# Patient Record
Sex: Female | Born: 1965 | Race: White | Hispanic: No | Smoking: Former smoker
Health system: Southern US, Community
[De-identification: ages and names within clinical notes are randomized; demographics above are authoritative.]

## PROBLEM LIST (undated history)

## (undated) DIAGNOSIS — K219 Gastro-esophageal reflux disease without esophagitis: Secondary | ICD-10-CM

## (undated) DIAGNOSIS — F419 Anxiety disorder, unspecified: Secondary | ICD-10-CM

## (undated) DIAGNOSIS — F329 Major depressive disorder, single episode, unspecified: Secondary | ICD-10-CM

## (undated) DIAGNOSIS — E119 Type 2 diabetes mellitus without complications: Secondary | ICD-10-CM

## (undated) DIAGNOSIS — I10 Essential (primary) hypertension: Secondary | ICD-10-CM

## (undated) DIAGNOSIS — E7800 Pure hypercholesterolemia, unspecified: Secondary | ICD-10-CM

## (undated) DIAGNOSIS — K7460 Unspecified cirrhosis of liver: Secondary | ICD-10-CM

## (undated) DIAGNOSIS — E039 Hypothyroidism, unspecified: Secondary | ICD-10-CM

## (undated) DIAGNOSIS — F32A Depression, unspecified: Secondary | ICD-10-CM

## (undated) DIAGNOSIS — S82851S Displaced trimalleolar fracture of right lower leg, sequela: Secondary | ICD-10-CM

## (undated) HISTORY — PX: UMBILICAL HERNIA REPAIR: SHX196

## (undated) HISTORY — PX: WRIST SURGERY: SHX841

## (undated) HISTORY — PX: ABDOMINAL HYSTERECTOMY: SHX81

## (undated) HISTORY — PX: CHOLECYSTECTOMY: SHX55

## (undated) HISTORY — DX: Unspecified cirrhosis of liver: K74.60

---

## 2002-06-25 ENCOUNTER — Encounter: Payer: Self-pay | Admitting: Emergency Medicine

## 2002-06-25 ENCOUNTER — Emergency Department (HOSPITAL_COMMUNITY): Admission: EM | Admit: 2002-06-25 | Discharge: 2002-06-25 | Payer: Self-pay | Admitting: Emergency Medicine

## 2002-07-12 ENCOUNTER — Ambulatory Visit (HOSPITAL_COMMUNITY): Admission: RE | Admit: 2002-07-12 | Discharge: 2002-07-12 | Payer: Self-pay | Admitting: Pulmonary Disease

## 2003-08-04 ENCOUNTER — Ambulatory Visit (HOSPITAL_COMMUNITY): Admission: RE | Admit: 2003-08-04 | Discharge: 2003-08-04 | Payer: Self-pay | Admitting: Pulmonary Disease

## 2003-08-06 ENCOUNTER — Ambulatory Visit (HOSPITAL_COMMUNITY): Admission: RE | Admit: 2003-08-06 | Discharge: 2003-08-06 | Payer: Self-pay | Admitting: Pulmonary Disease

## 2003-08-08 ENCOUNTER — Ambulatory Visit (HOSPITAL_COMMUNITY): Admission: RE | Admit: 2003-08-08 | Discharge: 2003-08-08 | Payer: Self-pay | Admitting: Pulmonary Disease

## 2003-08-13 ENCOUNTER — Ambulatory Visit (HOSPITAL_COMMUNITY): Admission: RE | Admit: 2003-08-13 | Discharge: 2003-08-13 | Payer: Self-pay | Admitting: General Surgery

## 2003-10-22 ENCOUNTER — Ambulatory Visit (HOSPITAL_COMMUNITY): Admission: RE | Admit: 2003-10-22 | Discharge: 2003-10-22 | Payer: Self-pay | Admitting: Orthopedic Surgery

## 2004-07-20 ENCOUNTER — Encounter (HOSPITAL_COMMUNITY): Admission: RE | Admit: 2004-07-20 | Discharge: 2004-08-19 | Payer: Self-pay

## 2004-08-03 ENCOUNTER — Ambulatory Visit (HOSPITAL_COMMUNITY): Admission: RE | Admit: 2004-08-03 | Discharge: 2004-08-03 | Payer: Self-pay | Admitting: Pulmonary Disease

## 2004-09-08 ENCOUNTER — Ambulatory Visit (HOSPITAL_COMMUNITY): Admission: RE | Admit: 2004-09-08 | Discharge: 2004-09-08 | Payer: Self-pay | Admitting: Family Medicine

## 2005-07-21 ENCOUNTER — Emergency Department (HOSPITAL_COMMUNITY): Admission: EM | Admit: 2005-07-21 | Discharge: 2005-07-21 | Payer: Self-pay | Admitting: Emergency Medicine

## 2006-02-15 ENCOUNTER — Emergency Department (HOSPITAL_COMMUNITY): Admission: EM | Admit: 2006-02-15 | Discharge: 2006-02-15 | Payer: Self-pay | Admitting: Emergency Medicine

## 2006-10-09 ENCOUNTER — Emergency Department (HOSPITAL_COMMUNITY): Admission: EM | Admit: 2006-10-09 | Discharge: 2006-10-10 | Payer: Self-pay | Admitting: Emergency Medicine

## 2007-10-19 ENCOUNTER — Emergency Department (HOSPITAL_COMMUNITY): Admission: EM | Admit: 2007-10-19 | Discharge: 2007-10-19 | Payer: Self-pay | Admitting: Emergency Medicine

## 2009-10-08 ENCOUNTER — Ambulatory Visit: Payer: Self-pay | Admitting: Cardiology

## 2009-10-09 ENCOUNTER — Encounter: Payer: Self-pay | Admitting: Cardiology

## 2009-11-03 DIAGNOSIS — R079 Chest pain, unspecified: Secondary | ICD-10-CM

## 2009-11-03 DIAGNOSIS — K449 Diaphragmatic hernia without obstruction or gangrene: Secondary | ICD-10-CM

## 2009-11-03 DIAGNOSIS — E785 Hyperlipidemia, unspecified: Secondary | ICD-10-CM

## 2009-11-03 DIAGNOSIS — K219 Gastro-esophageal reflux disease without esophagitis: Secondary | ICD-10-CM | POA: Insufficient documentation

## 2009-11-03 DIAGNOSIS — E039 Hypothyroidism, unspecified: Secondary | ICD-10-CM | POA: Insufficient documentation

## 2009-11-03 DIAGNOSIS — F341 Dysthymic disorder: Secondary | ICD-10-CM | POA: Insufficient documentation

## 2009-11-03 DIAGNOSIS — E669 Obesity, unspecified: Secondary | ICD-10-CM

## 2009-11-04 ENCOUNTER — Encounter: Payer: Self-pay | Admitting: Cardiology

## 2009-12-15 ENCOUNTER — Emergency Department (HOSPITAL_COMMUNITY): Admission: EM | Admit: 2009-12-15 | Discharge: 2009-12-15 | Payer: Self-pay | Admitting: Emergency Medicine

## 2010-02-20 ENCOUNTER — Emergency Department (HOSPITAL_COMMUNITY): Admission: EM | Admit: 2010-02-20 | Discharge: 2010-02-20 | Payer: Self-pay | Admitting: Emergency Medicine

## 2010-08-12 NOTE — Letter (Signed)
Summary: Appointment -missed  [facility] [address]   [place], [place] [address]   Phone: [phone]  Fax: [phone]     November 04, 2009 MRN: [id number]     Candice Stanley [address] [place], [place]  [address]     Dear [name],  Our records indicate you missed your appointment on November 04, 2009                        with Dr.  Diona Browner.   It is very important that we reach you to reschedule this appointment. We look forward to participating in your health care needs.   Please contact us at the number listed above at your earliest convenience to reschedule this appointment.   Sincerely,    Glass blower/designer

## 2010-08-12 NOTE — Consult Note (Signed)
Summary: [facility]  [facility]   Imported By: Zachary George 11/03/2009 16:21:36  _____________________________________________________________________  External Attachment:    Type:   Image     Comment:   External Document

## 2010-08-12 NOTE — Letter (Signed)
Summary: [facility] D/C DR. HASANAJ  [facility] D[facility]C DR. [facility]ANAJ   Imported By: Zachary George 11/03/2009 16:20:38  _____________________________________________________________________  External Attachment:    Type:   Image     Comment:   External Document

## 2010-09-24 LAB — DIFFERENTIAL
Basophils Absolute: 0 10*3/uL (ref 0.0–0.1)
Basophils Relative: 0 % (ref 0–1)
Monocytes Absolute: 0.1 10*3/uL (ref 0.1–1.0)
Neutro Abs: 9.3 10*3/uL — ABNORMAL HIGH (ref 1.7–7.7)
Neutrophils Relative %: 90 % — ABNORMAL HIGH (ref 43–77)

## 2010-09-24 LAB — URINALYSIS, ROUTINE W REFLEX MICROSCOPIC
Glucose, UA: NEGATIVE mg/dL
Protein, ur: NEGATIVE mg/dL

## 2010-09-24 LAB — BASIC METABOLIC PANEL
BUN: 6 mg/dL (ref 6–23)
Calcium: 10.4 mg/dL (ref 8.4–10.5)
Creatinine, Ser: 0.75 mg/dL (ref 0.4–1.2)
GFR calc non Af Amer: >60 mL/min (ref >60)
Glucose, Bld: 128 mg/dL — ABNORMAL HIGH (ref 70–99)

## 2010-09-24 LAB — CBC
MCHC: 34.4 g/dL (ref 30.0–36.0)
Platelets: 227 10*3/uL (ref 150–400)
RDW: 12.7 % (ref 11.5–15.5)

## 2010-09-24 LAB — URINE CULTURE: Colony Count: 50000

## 2010-09-24 LAB — PROTIME-INR: INR: 0.91 (ref 0.00–1.49)

## 2010-11-26 NOTE — Procedures (Signed)
NAMEDAMAYA, CHANNING                 ACCOUNT NO.:  [id number]   MEDICAL RECORD NO.:  [id number]          PATIENT TYPE:  EMS   LOCATION:  ED                            FACILITY:  [facility]   PHYSICIAN:  Edward L. Juanetta Gosling, M.D.DATE OF BIRTH:  1966-06-13   DATE OF PROCEDURE:  02/15/2006  DATE OF DISCHARGE:  02/15/2006                                EKG INTERPRETATION   TIME AND DATE:  1610 on 02/15/2006   INTERPRETATION:  Sinus rhythm with a rate in the 80s.  There are multiple  PVCs.  Axis is indeterminate.  There is an incomplete right bundle branch  block.  There is possible left atrial enlargement.   IMPRESSION:  Abnormal electrocardiogram.      Edward L. Juanetta Gosling, M.D.  Electronically Signed     [facility]/MEDQ  D:  02/15/2006  T:  02/15/2006  Job:  [id number]

## 2010-11-26 NOTE — Op Note (Signed)
NAME:  Candice Stanley, Candice Stanley                           ACCOUNT NO.:  [id number]   MEDICAL RECORD NO.:  [id number]                   PATIENT TYPE:  AMB   LOCATION:  DAY                                  FACILITY:  [facility]   PHYSICIAN:  Dalia Heading, M.D.               DATE OF BIRTH:  1966-04-01   DATE OF PROCEDURE:  08/13/2003  DATE OF DISCHARGE:                                 OPERATIVE REPORT   PREOPERATIVE DIAGNOSIS:  Chronic cholecystitis.   POSTOPERATIVE DIAGNOSIS:  Chronic cholecystitis.   PROCEDURE:  Laparoscopic cholecystectomy.   SURGEON:  Dalia Heading, M.D.   ASSISTANT:  Bernerd Limbo. Leona Carry, M.D.   ANESTHESIA:  General endotracheal.   INDICATIONS FOR PROCEDURE:  The patient is a 45 year old white female who  presents with biliary colic secondary to chronic cholecystitis. The risks  and benefits of the procedure including bleeding, infection, hepatobiliary  injury and the possibility of an open procedure were fully explained to the  patient, who gave informed consent.   DESCRIPTION OF PROCEDURE:  The patient was placed in the supine position.  After induction of general endotracheal anesthesia, the abdomen was prepped  and draped using the usual sterile technique with Betadine.  Surgical site  confirmation was performed.   A supraumbilical incision was made down to the fascia.  A Veress needle was  introduced into the abdominal cavity and confirmation of placement was done  using the saline drop test. The abdomen was then insufflated to 16 mmHg  pressure. An 11 mm trocar was introduced into the abdominal cavity under  direct visualization without difficulty. The patient was placed in reverse  Trendelenburg position and an additional 11 mm trocar was placed in the  epigastric region and 5 mm trocars were placed in the right upper quadrant  and right flank regions. The liver was inspected and noted to be within  normal limits. The gallbladder was retracted superior and  laterally. The  dissection was begun around the infundibulum of the gallbladder. The cystic  duct was first identified, its junction to the infundibulum fully  identified. Endoclips were placed proximally and distally on the cystic duct  and the cystic duct was divided. This was likewise done on the cystic  artery. The gallbladder was then freed away from the gallbladder fossa using  Bovie electrocautery.  The gallbladder was delivered through the epigastric  trocar site using an EndoCatch bag. The gallbladder fossa was inspected and  no abnormal bleeding or bile leakage was noted.  All fluid and air were then  evacuated from the abdominal cavity prior to removal of the trocars.   All wounds were irrigated with normal saline. All wounds were injected with  0.5% Sensorcaine. The supraumbilical fascia was reapproximated using an #0  Vicryl interrupted suture. All skin incisions were closed using staples.  Betadine ointment and dry sterile dressings were applied.   All tape and needle  counts were correct at the end of the procedure. The  patient was extubated in the operating room and went back to the recovery  room awake in stable condition.   COMPLICATIONS:  None.   SPECIMENS:  Gallbladder.   ESTIMATED BLOOD LOSS:  Minimal.      ___________________________________________                                            Dalia Heading, M.D.   MAJ/MEDQ  D:  08/13/2003  T:  08/13/2003  Job:  [id number]   cc:   Ramon Dredge L. Juanetta Gosling, M.D.  [address]  [place]  [place] [address]  Fax: [phone]

## 2010-11-26 NOTE — H&P (Signed)
NAME:  Candice Stanley, Candice Stanley                           ACCOUNT NO.:  [id number]   MEDICAL RECORD NO.:  [id number]                   PATIENT TYPE:  AMB   LOCATION:  DAY                                  FACILITY:  [facility]   PHYSICIAN:  Dalia Heading, M.D.               DATE OF BIRTH:  [birth year]   DATE OF ADMISSION:  DATE OF DISCHARGE:                                HISTORY & PHYSICAL   CHIEF COMPLAINT:  Biliary colic secondary to chronic cholecystitis.   HISTORY OF PRESENT ILLNESS:  The patient is a 45 year old white female who  is referred for evaluation and treatment of biliary colic secondary to  chronic cholecystitis.  She has been having intermittent episodes of right  upper quadrant abdominal discomfort, nausea, diarrhea, and bloating for  several weeks.  She does have fatty food intolerance.  No fever, chills, or  jaundice have been noted.   PAST MEDICAL HISTORY:  Includes hypothyroidism, reflux disease, extrinsic  allergies.   PAST SURGICAL HISTORY:  Hysterectomy.   CURRENT MEDICATIONS:  1. Synthroid 0.88 mg p.o. daily.  2. Zocor.  3. Cymbalta 60 mg p.o. daily.  4. Claritin 10 mg p.o. daily.  5. Nexium 40 mg p.o. daily.  6. Relacore two tablets a day.   ALLERGIES:  CODEINE.   REVIEW OF SYSTEMS:  The patient does not smoke.  She had a heart  catheterization in 1999 which was unremarkable.  She denies any other  cardiopulmonary difficulties or bleeding disorders.   PHYSICAL EXAMINATION:  GENERAL:  The patient is a well-developed, well-  nourished white female in no acute distress.  VITAL SIGNS:  She is afebrile and vital signs are stable.  HEENT:  Reveals no scleral icterus.  LUNGS:  Clear to auscultation with equal breath sounds bilaterally.  HEART:  Reveals a regular rate and rhythm without S3, S4, or murmurs.  ABDOMEN:  Soft with slight tenderness noted in the right upper quadrant to  palpation.  No hepatosplenomegaly, masses, or hernias are identified.   Hepatobiliary  scan reveals chronic cholecystitis with a low gallbladder  ejection fraction and reproducible symptoms with a fatty meal.   IMPRESSION:  Chronic cholecystitis.   PLAN:  The patient is scheduled for a laparoscopic cholecystectomy on  August 13, 2003.  The risks and benefits of the procedure including  bleeding, infection, hepatobiliary injury, and the possibility of an open  procedure were fully explained to the patient who gave informed consent.  Darvocet has been prescribed for pain.     ___________________________________________                                         Dalia Heading, M.D.   MAJ/MEDQ  D:  08/12/2003  T:  08/12/2003  Job:  [id number]   cc:   Ramon Dredge L.  Juanetta Gosling, M.D.  [address]  [place]  [place] [address]  Fax: [phone]

## 2011-05-20 ENCOUNTER — Encounter: Payer: Self-pay | Admitting: *Deleted

## 2011-05-20 ENCOUNTER — Emergency Department (HOSPITAL_COMMUNITY)
Admission: EM | Admit: 2011-05-20 | Discharge: 2011-05-21 | Disposition: A | Discharge status: Home / Self Care | Payer: Self-pay | Attending: Emergency Medicine | Admitting: Emergency Medicine

## 2011-05-20 ENCOUNTER — Other Ambulatory Visit: Payer: Self-pay

## 2011-05-20 DIAGNOSIS — R0789 Other chest pain: Secondary | ICD-10-CM | POA: Insufficient documentation

## 2011-05-20 DIAGNOSIS — E7800 Pure hypercholesterolemia, unspecified: Secondary | ICD-10-CM | POA: Insufficient documentation

## 2011-05-20 DIAGNOSIS — K219 Gastro-esophageal reflux disease without esophagitis: Secondary | ICD-10-CM | POA: Insufficient documentation

## 2011-05-20 HISTORY — DX: Pure hypercholesterolemia, unspecified: E78.00

## 2011-05-20 NOTE — ED Notes (Signed)
C/o CP, sob, onset today after eating around 1300, family h/o of heart dz, drove self, describes as sharp & intermittant, h/o similar for months, h/o acid reflux, no relief with alprazolam and ranitidine, radiates to L axillary side/ back, L neck and L jaw. diferrent from past reflux.

## 2011-05-21 ENCOUNTER — Emergency Department (HOSPITAL_COMMUNITY): Payer: Self-pay

## 2011-05-21 ENCOUNTER — Other Ambulatory Visit (HOSPITAL_COMMUNITY): Payer: Self-pay

## 2011-05-21 LAB — BASIC METABOLIC PANEL
Calcium: 10.2 mg/dL (ref 8.4–10.5)
GFR calc Af Amer: >90 mL/min (ref >90)
GFR calc non Af Amer: 85 mL/min — ABNORMAL LOW (ref >90)
Glucose, Bld: 137 mg/dL — ABNORMAL HIGH (ref 70–99)
Potassium: 4 mEq/L (ref 3.5–5.1)
Sodium: 137 mEq/L (ref 135–145)

## 2011-05-21 LAB — CBC
Hemoglobin: 12.7 g/dL (ref 12.0–15.0)
MCH: 31.8 pg (ref 26.0–34.0)
MCHC: 34.5 g/dL (ref 30.0–36.0)
Platelets: 252 10*3/uL (ref 150–400)
RDW: 12.5 % (ref 11.5–15.5)

## 2011-05-21 LAB — POCT I-STAT TROPONIN I
Troponin i, poc: 0 ng/mL (ref 0.00–0.08)
Troponin i, poc: 0.01 ng/mL (ref 0.00–0.08)

## 2011-05-21 MED ORDER — SIMETHICONE 80 MG PO CHEW
160.0000 mg | CHEWABLE_TABLET | Freq: Once | ORAL | Status: AC
  Administered 2011-05-21: 160 mg via ORAL
  Filled 2011-05-21: qty 2

## 2011-05-21 MED ORDER — ASPIRIN 81 MG PO CHEW
324.0000 mg | CHEWABLE_TABLET | Freq: Once | ORAL | Status: AC
  Administered 2011-05-21: 324 mg via ORAL
  Filled 2011-05-21: qty 4

## 2011-05-21 NOTE — ED Notes (Signed)
MD spoke with pt, pt has no questions at this time. Pt ambulatory and pain free. Pt alert and oriented and able to follow commands and move extremities.

## 2011-05-21 NOTE — ED Provider Notes (Signed)
History     CSN: [id number] Arrival date & time: 05/20/2011 10:49 PM   First MD Initiated Contact with Patient 05/21/11 0112      Chief Complaint  Patient presents with  . Chest Pain    (Consider location/radiation/quality/duration/timing/severity/associated sxs/prior treatment) HPI The patient is a 45 year old female who presents today for evaluation of chest pain. She complains of having intermittent anterior chest pain that localized just inferior to the left breast of the left chest wall. Patient has had this since 1 PM this afternoon. She can think of nothing that makes the pain better or worse. She states that when it's present it is very sharp and stabbing. She says at that time as a 9/10 but it only lasts a second or 2. Patient has family history positive for CAD before the age of 90 and her father as well as some of her cousins. Patient does not smoke. She is treated for hyperlipidemia but does not have a history of hypertension. She does have reflux and did attempt to take medication for this without resolution of her symptoms. Patient has had a catheterization in the past that was completely clear. She has also had stress test that has been completely clear. There are no other associated or modifying factors Past Medical History  Diagnosis Date  . Acid reflux   . Hypercholesteremia   . Thyroid disease     Past Surgical History  Procedure Date  . Abdominal hysterectomy   . Cholecystectomy   . Wrist surgery     Family History  Problem Relation Age of Onset  . Hypertension Mother   . Coronary artery disease Father   . Coronary artery disease Paternal Uncle   . Coronary artery disease Cousin     History  Substance Use Topics  . Smoking status: Former Smoker    Types: Cigarettes    Quit date: 05/20/1991  . Smokeless tobacco: Not on file  . Alcohol Use: No    OB History    Grav Para Term Preterm Abortions TAB SAB Ect Mult Living                  Review of  Systems  Unable to perform ROS Constitutional: Negative.   HENT: Negative.   Eyes: Negative.   Respiratory: Negative.   Cardiovascular: Positive for chest pain.  Gastrointestinal: Negative.   Genitourinary: Negative.   Skin: Negative.   Neurological: Negative.   Hematological: Negative.   Psychiatric/Behavioral: Negative.   All other systems reviewed and are negative.    Allergies  Codeine  Home Medications   Current Outpatient Rx  Name Route Sig Dispense Refill  . ALPRAZOLAM 0.5 MG PO TABS Oral Take 0.25 mg by mouth daily as needed. For anxiety     . ASPIRIN 325 MG PO TABS Oral Take 325 mg by mouth daily.      Marland Kitchen CITALOPRAM HYDROBROMIDE 20 MG PO TABS Oral Take 20 mg by mouth daily.      Marland Kitchen LEVOTHYROXINE SODIUM 50 MCG PO TABS Oral Take 50 mcg by mouth daily.      Marland Kitchen LORATADINE 10 MG PO TABS Oral Take 10 mg by mouth daily.      Marland Kitchen LOVASTATIN 20 MG PO TABS Oral Take 20 mg by mouth daily.      Marland Kitchen RANITIDINE HCL 150 MG PO TABS Oral Take 300 mg by mouth at bedtime.        BP 104/58  Pulse 61  Temp(Src) 97.9 F (36.6 C) (  Oral)  Resp 20  SpO2 100%  Physical Exam  Nursing note and vitals reviewed. Constitutional: She is oriented to person, place, and time. She appears well-developed and well-nourished. No distress.  HENT:  Head: Normocephalic and atraumatic.  Eyes: Conjunctivae and EOM are normal. Pupils are equal, round, and reactive to light.  Neck: Normal range of motion.  Cardiovascular: Normal rate, regular rhythm, normal heart sounds and intact distal pulses.  Exam reveals no gallop and no friction rub.   No murmur heard. Pulmonary/Chest: Effort normal and breath sounds normal. No respiratory distress. She has no wheezes. She has no rales.  Abdominal: Soft. Bowel sounds are normal. She exhibits no distension. There is no tenderness. There is no rebound and no guarding.  Musculoskeletal: Normal range of motion. She exhibits no edema and no tenderness.  Neurological: She is  alert and oriented to person, place, and time. No cranial nerve deficit. She exhibits normal muscle tone. Coordination normal.  Skin: Skin is warm and dry. No rash noted.  Psychiatric: She has a normal mood and affect.    ED Course  Procedures (including critical care time)  Labs Reviewed  CBC - Abnormal; Notable for the following:    WBC 13.6 (*)    All other components within normal limits  BASIC METABOLIC PANEL - Abnormal; Notable for the following:    Glucose, Bld 137 (*)    GFR calc non Af Amer 85 (*)    All other components within normal limits  POCT I-STAT TROPONIN I  POCT I-STAT TROPONIN I   Dg Chest 2 View  05/21/2011  *RADIOLOGY REPORT*  Clinical Data: Chest pain and shortness of breath.  CHEST - 2 VIEW  Comparison: Chest radiograph performed 10/09/2006  Findings: The lungs are well-aerated and clear.  There is no evidence of focal opacification, pleural effusion or pneumothorax.  The heart is borderline normal in size; the mediastinal contour is within normal limits.  No acute osseous abnormalities are seen. Clips are noted within the right upper quadrant, reflecting prior cholecystectomy.  IMPRESSION: No acute cardiopulmonary process seen.  Original Report Authenticated By: Tonia Ghent, M.D.    Date: 05/21/2011  Rate: 81  Rhythm: normal sinus rhythm  QRS Axis: normal  Intervals: normal  ST/T Wave abnormalities: normal  Conduction Disutrbances:none  Narrative Interpretation: normal ECG  Old EKG Reviewed: none available   1. Chest pain       MDM  The patient was evaluated by myself. She had very atypical history for chest pain concerning for ACS. Chest pain was momentary. Patient had taken TUMS at home without relief of her symptoms. She was given aspirin here while workup is complete. Also given her report of her symptoms patient was given simethicone as well. She was not having symptoms prior to then for visit. Her pain was not lasting more than a few seconds and  patient agree that she did not require anything for this. Patient did have negative enzymes at 0 and 3 hours. Patient and I discussed that she could followup as an outpatient with her primary care physician to have repeat stress testing as it has been sometime since he has had this. Patient and family were comfortable with plan for discharge home per their report. She was discharged home in good condition.  Cyndra Numbers, MD 05/21/11 [phone]

## 2011-05-21 NOTE — ED Notes (Signed)
Pt complaining of intermittant  CP, pt states that the pain is left side and under her breast pain last for a few seconds and goes away. Pt has history of Chest pain and family history of cardiac problems. Pt denies any aspirin today. Pt denies and SOB, N/V. Pt denies any pain with deep breaths. Pt has history of heartburn as well Pt alert and oriented and able to follow commands and move extremities.

## 2012-01-17 ENCOUNTER — Encounter (HOSPITAL_COMMUNITY): Payer: Self-pay | Admitting: *Deleted

## 2012-01-17 ENCOUNTER — Emergency Department (HOSPITAL_COMMUNITY): Payer: Self-pay

## 2012-01-17 ENCOUNTER — Observation Stay (HOSPITAL_COMMUNITY)
Admission: EM | Admit: 2012-01-17 | Discharge: 2012-01-17 | Disposition: A | Discharge status: Home / Self Care | Payer: Self-pay | Attending: Internal Medicine | Admitting: Internal Medicine

## 2012-01-17 ENCOUNTER — Other Ambulatory Visit: Payer: Self-pay

## 2012-01-17 DIAGNOSIS — E7800 Pure hypercholesterolemia, unspecified: Secondary | ICD-10-CM | POA: Insufficient documentation

## 2012-01-17 DIAGNOSIS — E669 Obesity, unspecified: Secondary | ICD-10-CM

## 2012-01-17 DIAGNOSIS — K219 Gastro-esophageal reflux disease without esophagitis: Secondary | ICD-10-CM | POA: Insufficient documentation

## 2012-01-17 DIAGNOSIS — R51 Headache: Secondary | ICD-10-CM | POA: Insufficient documentation

## 2012-01-17 DIAGNOSIS — E785 Hyperlipidemia, unspecified: Secondary | ICD-10-CM | POA: Insufficient documentation

## 2012-01-17 DIAGNOSIS — R079 Chest pain, unspecified: Secondary | ICD-10-CM

## 2012-01-17 DIAGNOSIS — K449 Diaphragmatic hernia without obstruction or gangrene: Secondary | ICD-10-CM

## 2012-01-17 DIAGNOSIS — R7309 Other abnormal glucose: Secondary | ICD-10-CM | POA: Insufficient documentation

## 2012-01-17 DIAGNOSIS — E039 Hypothyroidism, unspecified: Secondary | ICD-10-CM | POA: Insufficient documentation

## 2012-01-17 DIAGNOSIS — F341 Dysthymic disorder: Secondary | ICD-10-CM

## 2012-01-17 DIAGNOSIS — Z6837 Body mass index (BMI) 37.0-37.9, adult: Secondary | ICD-10-CM | POA: Insufficient documentation

## 2012-01-17 LAB — CBC WITH DIFFERENTIAL/PLATELET
Basophils Absolute: 0 10*3/uL (ref 0.0–0.1)
Eosinophils Absolute: 0.1 10*3/uL (ref 0.0–0.7)
Eosinophils Relative: 1 % (ref 0–5)
HCT: 37.4 % (ref 36.0–46.0)
Lymphocytes Relative: 44 % (ref 12–46)
MCH: 31.5 pg (ref 26.0–34.0)
MCHC: 34.5 g/dL (ref 30.0–36.0)
MCV: 91.2 fL (ref 78.0–100.0)
Monocytes Absolute: 0.5 10*3/uL (ref 0.1–1.0)
Platelets: 232 10*3/uL (ref 150–400)
RDW: 12.9 % (ref 11.5–15.5)

## 2012-01-17 LAB — POCT I-STAT TROPONIN I: Troponin i, poc: 0 ng/mL (ref 0.00–0.08)

## 2012-01-17 LAB — BASIC METABOLIC PANEL
CO2: 27 mEq/L (ref 19–32)
Calcium: 10.1 mg/dL (ref 8.4–10.5)
Creatinine, Ser: 0.74 mg/dL (ref 0.50–1.10)
GFR calc non Af Amer: >90 mL/min (ref >90)
Sodium: 137 mEq/L (ref 135–145)

## 2012-01-17 LAB — LIPID PANEL
Cholesterol: 220 mg/dL — ABNORMAL HIGH (ref 0–200)
Total CHOL/HDL Ratio: 4.6 RATIO
Triglycerides: 195 mg/dL — ABNORMAL HIGH (ref <150)
VLDL: 39 mg/dL (ref 0–40)

## 2012-01-17 LAB — CARDIAC PANEL(CRET KIN+CKTOT+MB+TROPI)
CK, MB: 2.1 ng/mL (ref 0.3–4.0)
Total CK: 90 U/L (ref 7–177)
Troponin I: <0.3 ng/mL (ref <0.30)

## 2012-01-17 MED ORDER — SIMVASTATIN 10 MG PO TABS
10.0000 mg | ORAL_TABLET | Freq: Every day | ORAL | Status: DC
Start: 2012-01-17 — End: 2012-01-17
  Filled 2012-01-17: qty 1

## 2012-01-17 MED ORDER — LORATADINE 10 MG PO TABS
10.0000 mg | ORAL_TABLET | Freq: Every day | ORAL | Status: DC
Start: 2012-01-17 — End: 2012-01-17
  Administered 2012-01-17: 10 mg via ORAL
  Filled 2012-01-17: qty 1

## 2012-01-17 MED ORDER — HYDROCODONE-ACETAMINOPHEN 5-325 MG PO TABS
1.0000 | ORAL_TABLET | Freq: Four times a day (QID) | ORAL | Status: DC | PRN
  Administered 2012-01-17: 1 via ORAL
  Filled 2012-01-17: qty 1

## 2012-01-17 MED ORDER — ENOXAPARIN SODIUM 40 MG/0.4ML ~~LOC~~ SOLN
40.0000 mg | SUBCUTANEOUS | Status: DC
  Administered 2012-01-17: 40 mg via SUBCUTANEOUS
  Filled 2012-01-17: qty 0.4

## 2012-01-17 MED ORDER — PANTOPRAZOLE SODIUM 40 MG PO TBEC
40.0000 mg | DELAYED_RELEASE_TABLET | Freq: Every day | ORAL | Status: DC
  Administered 2012-01-17: 40 mg via ORAL
  Filled 2012-01-17: qty 1

## 2012-01-17 MED ORDER — NITROGLYCERIN 0.4 MG SL SUBL: 0.4000 mg | SUBLINGUAL_TABLET | SUBLINGUAL | Status: DC | PRN

## 2012-01-17 MED ORDER — MORPHINE SULFATE 4 MG/ML IJ SOLN
4.0000 mg | Freq: Once | INTRAMUSCULAR | Status: AC
  Administered 2012-01-17: 4 mg via INTRAVENOUS
  Filled 2012-01-17: qty 1

## 2012-01-17 MED ORDER — ALPRAZOLAM 0.25 MG PO TABS: 0.2500 mg | ORAL_TABLET | Freq: Every day | ORAL | Status: DC | PRN

## 2012-01-17 MED ORDER — ASPIRIN 81 MG PO CHEW
324.0000 mg | CHEWABLE_TABLET | Freq: Once | ORAL | Status: AC
  Administered 2012-01-17: 324 mg via ORAL
  Filled 2012-01-17: qty 4

## 2012-01-17 MED ORDER — ONDANSETRON HCL 4 MG/2ML IJ SOLN
4.0000 mg | Freq: Four times a day (QID) | INTRAMUSCULAR | Status: DC | PRN
  Administered 2012-01-17: 4 mg via INTRAVENOUS
  Filled 2012-01-17: qty 2

## 2012-01-17 MED ORDER — LOVASTATIN 40 MG PO TABS: 40.0000 mg | ORAL_TABLET | Freq: Every day | ORAL | Status: DC

## 2012-01-17 MED ORDER — ONDANSETRON HCL 4 MG/2ML IJ SOLN
4.0000 mg | Freq: Once | INTRAMUSCULAR | Status: AC
  Administered 2012-01-17: 4 mg via INTRAVENOUS

## 2012-01-17 MED ORDER — ALBUTEROL SULFATE (5 MG/ML) 0.5% IN NEBU: 2.5000 mg | INHALATION_SOLUTION | RESPIRATORY_TRACT | Status: DC | PRN

## 2012-01-17 MED ORDER — CITALOPRAM HYDROBROMIDE 20 MG PO TABS
20.0000 mg | ORAL_TABLET | Freq: Every day | ORAL | Status: DC
  Filled 2012-01-17 (×2): qty 1

## 2012-01-17 MED ORDER — POTASSIUM CHLORIDE CRYS ER 20 MEQ PO TBCR
40.0000 meq | EXTENDED_RELEASE_TABLET | Freq: Once | ORAL | Status: AC
  Administered 2012-01-17: 40 meq via ORAL
  Filled 2012-01-17: qty 2

## 2012-01-17 MED ORDER — ACETAMINOPHEN 650 MG RE SUPP: 650.0000 mg | Freq: Four times a day (QID) | RECTAL | Status: DC | PRN

## 2012-01-17 MED ORDER — LEVOTHYROXINE SODIUM 50 MCG PO TABS
50.0000 ug | ORAL_TABLET | Freq: Every day | ORAL | Status: DC
  Filled 2012-01-17 (×2): qty 1

## 2012-01-17 MED ORDER — SODIUM CHLORIDE 0.9 % IJ SOLN
3.0000 mL | Freq: Two times a day (BID) | INTRAMUSCULAR | Status: DC
  Administered 2012-01-17: 3 mL via INTRAVENOUS
  Filled 2012-01-17: qty 3

## 2012-01-17 MED ORDER — ASPIRIN 325 MG PO TABS: 325.0000 mg | ORAL_TABLET | Freq: Every day | ORAL | Status: DC

## 2012-01-17 MED ORDER — ONDANSETRON HCL 4 MG PO TABS
4.0000 mg | ORAL_TABLET | Freq: Four times a day (QID) | ORAL | Status: DC | PRN
  Administered 2012-01-17: 4 mg via ORAL
  Filled 2012-01-17: qty 1

## 2012-01-17 MED ORDER — ACETAMINOPHEN 325 MG PO TABS
650.0000 mg | ORAL_TABLET | Freq: Four times a day (QID) | ORAL | Status: DC | PRN
  Administered 2012-01-17: 650 mg via ORAL
  Filled 2012-01-17: qty 2

## 2012-01-17 MED ORDER — ONDANSETRON HCL 4 MG/2ML IJ SOLN
INTRAMUSCULAR | Status: AC
  Administered 2012-01-17: 4 mg via INTRAVENOUS
  Filled 2012-01-17: qty 2

## 2012-01-17 MED ORDER — ONDANSETRON HCL 4 MG/2ML IJ SOLN
4.0000 mg | Freq: Once | INTRAMUSCULAR | Status: AC
  Administered 2012-01-17: 4 mg via INTRAVENOUS
  Filled 2012-01-17: qty 2

## 2012-01-17 NOTE — ED Notes (Signed)
Darel Hong, RN called back, patient's room upstairs is not clean. Darel Hong to call back when room is clean.

## 2012-01-17 NOTE — ED Notes (Signed)
Pt vomiting at this time 

## 2012-01-17 NOTE — ED Notes (Signed)
[place] office called and scheduled pt appt for July 17th, Wednesday for 10:45am

## 2012-01-17 NOTE — ED Notes (Signed)
Meal tray given 

## 2012-01-17 NOTE — ED Notes (Signed)
Pt c/o feeling "sick" from hydrocodone.

## 2012-01-17 NOTE — ED Notes (Signed)
Attempted to call report x 2. 1st attempted no answer, phone rang for 5 minutes. 2nd attempt this RN was hung up on. Will have charge RN attempt to call.

## 2012-01-17 NOTE — ED Notes (Signed)
Patient states she does not want any nitro SL because it gives her a headache

## 2012-01-17 NOTE — ED Provider Notes (Signed)
History     CSN: [id number]  Arrival date & time 01/17/12  0154   First MD Initiated Contact with Patient 01/17/12 0221      Chief Complaint  Patient presents with  . Chest Pain    (Consider location/radiation/quality/duration/timing/severity/associated sxs/prior treatment) HPI History provided by patient. At home tonight, at rest developed left-sided chest pain radiating to her left arm and left neck. Pressure-like in quality. No shortness of breath. Has had associated nausea. Patient is very anxious complains of increased stress recently. Patient mostly worried because her father died of MI in September 05, 2022 this year. Her father began having heart problems in his 74s, she has strong family history of coronary artery disease. Previous smoker. Treated for hyperlipidemia. No diabetes or hypertension. Had a stress test about 4 years ago. Had a cardiac cath in 1998 reportedly normal. Not follow a cardiologist. Pain moderate severity. Lasted about 30 minutes and is now pain-free. Will have some nausea. No weakness or numbness. No recent cough cold or congestion. No sick contacts. No heartburn reflux. Bili pain or leg swelling. No history of DVT or PE. Past Medical History  Diagnosis Date  . Acid reflux   . Hypercholesteremia   . Thyroid disease     Past Surgical History  Procedure Date  . Abdominal hysterectomy   . Cholecystectomy   . Wrist surgery     Family History  Problem Relation Age of Onset  . Hypertension Mother   . Coronary artery disease Father   . Coronary artery disease Paternal Uncle   . Coronary artery disease Cousin     History  Substance Use Topics  . Smoking status: Former Smoker    Types: Cigarettes    Quit date: 05/20/1991  . Smokeless tobacco: Not on file  . Alcohol Use: No    OB History    Grav Para Term Preterm Abortions TAB SAB Ect Mult Living                  Review of Systems  Constitutional: Negative for fever and chills.  HENT: Negative for neck  pain and neck stiffness.   Eyes: Negative for pain.  Respiratory: Negative for shortness of breath.   Cardiovascular: Positive for chest pain.  Gastrointestinal: Negative for abdominal pain.  Genitourinary: Negative for dysuria.  Musculoskeletal: Negative for back pain.  Skin: Negative for rash.  Neurological: Negative for headaches.  All other systems reviewed and are negative.    Allergies  Codeine  Home Medications   Current Outpatient Rx  Name Route Sig Dispense Refill  . ALPRAZOLAM 0.5 MG PO TABS Oral Take 0.25 mg by mouth daily as needed. For anxiety     . ASPIRIN 325 MG PO TABS Oral Take 325 mg by mouth daily.      Marland Kitchen CITALOPRAM HYDROBROMIDE 20 MG PO TABS Oral Take 20 mg by mouth daily.      Marland Kitchen LEVOTHYROXINE SODIUM 50 MCG PO TABS Oral Take 50 mcg by mouth daily.      Marland Kitchen LORATADINE 10 MG PO TABS Oral Take 10 mg by mouth daily.      Marland Kitchen LOVASTATIN 20 MG PO TABS Oral Take 20 mg by mouth daily.      Marland Kitchen RANITIDINE HCL 150 MG PO TABS Oral Take 300 mg by mouth at bedtime.        BP 116/53  Temp 98 F (36.7 C) (Oral)  Resp 20  Ht 5\' 4"  (1.626 m)  Wt 215 lb (97.523 kg)  BMI 36.90 kg/m2  SpO2 99%  Physical Exam  Constitutional: She is oriented to person, place, and time. She appears well-developed and well-nourished.  HENT:  Head: Normocephalic and atraumatic.  Eyes: Conjunctivae and EOM are normal. Pupils are equal, round, and reactive to light.  Neck: Trachea normal. Neck supple. No thyromegaly present.  Cardiovascular: Normal rate, regular rhythm, S1 normal, S2 normal and normal pulses.     No systolic murmur is present   No diastolic murmur is present  Pulses:      Radial pulses are 2+ on the right side, and 2+ on the left side.  Pulmonary/Chest: Effort normal and breath sounds normal. She has no wheezes. She has no rhonchi. She has no rales. She exhibits no tenderness.  Abdominal: Soft. Normal appearance and bowel sounds are normal. There is no tenderness. There is no  CVA tenderness and negative Murphy's sign.  Musculoskeletal:       BLE:s Calves nontender, no cords or erythema, negative Homans sign  Neurological: She is alert and oriented to person, place, and time. She has normal strength. No cranial nerve deficit or sensory deficit. GCS eye subscore is 4. GCS verbal subscore is 5. GCS motor subscore is 6.  Skin: Skin is warm and dry. No rash noted. She is not diaphoretic.  Psychiatric: Her speech is normal.       Anxious and tearful at times    ED Course  Procedures (including critical care time)  Results for orders placed during the hospital encounter of 01/17/12  TROPONIN I      Component Value Range   Troponin I <0.30  <0.30 ng/mL  CBC WITH DIFFERENTIAL      Component Value Range   WBC 7.7  4.0 - 10.5 K/uL   RBC 4.10  3.87 - 5.11 MIL/uL   Hemoglobin 12.9  12.0 - 15.0 g/dL   HCT 40.9  81.1 - 91.4 %   MCV 91.2  78.0 - 100.0 fL   MCH 31.5  26.0 - 34.0 pg   MCHC 34.5  30.0 - 36.0 g/dL   RDW 78.2  95.6 - 21.3 %   Platelets 232  150 - 400 K/uL   Neutrophils Relative 49  43 - 77 %   Neutro Abs 3.7  1.7 - 7.7 K/uL   Lymphocytes Relative 44  12 - 46 %   Lymphs Abs 3.4  0.7 - 4.0 K/uL   Monocytes Relative 6  3 - 12 %   Monocytes Absolute 0.5  0.1 - 1.0 K/uL   Eosinophils Relative 1  0 - 5 %   Eosinophils Absolute 0.1  0.0 - 0.7 K/uL   Basophils Relative 0  0 - 1 %   Basophils Absolute 0.0  0.0 - 0.1 K/uL  BASIC METABOLIC PANEL      Component Value Range   Sodium 137  135 - 145 mEq/L   Potassium 3.4 (*) 3.5 - 5.1 mEq/L   Chloride 102  96 - 112 mEq/L   CO2 27  19 - 32 mEq/L   Glucose, Bld 157 (*) 70 - 99 mg/dL   BUN 10  6 - 23 mg/dL   Creatinine, Ser 0.86  0.50 - 1.10 mg/dL   Calcium 57.8  8.4 - 46.9 mg/dL   GFR calc non Af Amer >90  >90 mL/min   GFR calc Af Amer >90  >90 mL/min  POCT I-STAT TROPONIN I      Component Value Range   Troponin i, poc 0.00  0.00 - 0.08 ng/mL   Comment 3            Dg Chest Portable 1 View  01/17/2012   *RADIOLOGY REPORT*  Clinical Data: Chest pain.  PORTABLE CHEST - 1 VIEW  Comparison: Chest radiograph 05/21/2011  Findings: The lungs are well-aerated.  Pulmonary vascularity is at the upper limits of normal.  There is no evidence of focal opacification, pleural effusion or pneumothorax.  The cardiomediastinal silhouette is within normal limits.  No acute osseous abnormalities are seen.  IMPRESSION: No acute cardiopulmonary process seen.  Original Report Authenticated By: Tonia Ghent, M.D.     Date: 01/17/2012  Rate: 75  Rhythm: normal sinus rhythm  QRS Axis: normal  Intervals: normal  ST/T Wave abnormalities: nonspecific ST changes  Conduction Disutrbances:none  Narrative Interpretation:   Old EKG Reviewed: unchanged  ASA. IV Morphine. Oxygen. IV and continuous cardiac monitoring.  4:39 AM d/w DR Rito Ehrlich on call for triad hospitalist, agrees to ED eval for admit MDM   Chest pain with presentation concerning for possible ACS. Nursing notes reviewed. Old records reviewed. Previous EKG reviewed. Pain resolved. Plan medical admission.        Sunnie Nielsen, MD 01/17/12 [phone]

## 2012-01-17 NOTE — Consult Note (Signed)
CARDIOLOGY CONSULT NOTE    Patient ID: Candice Stanley MRN: [id number] DOB/AGE: Jun 18, 1966 46 y.o.  Admit date: 01/17/2012 Referring Physician: Sherrie Mustache Primary Physician: Toma Deiters, MD Primary Cardiologist:  Diona Browner Reason for Consultation: Chest pain  Principal Problem:  *CHEST PAIN UNSPECIFIED Active Problems:  Unspecified hypothyroidism  HYPERLIPIDEMIA  Esophageal reflux   HPI:   46 yo obese female with elevated lipids and atypical chest pain.  Distant history of normal cath.  Normal myovue about 6 years ago.  Seen by Dr Andee Lineman and most recently by Dr Diona Browner 3/11.  At that time she had atypical chest pain and myovue was ordered but patient did not follow through due to lack of insurance.  She still has no insurance.  Pain last night sitting in bed playing solitaire on computer.  Nonexertional.  Radiated and settled in left axilla.  No pleuritic component.  No positional component.  Has reflux but no GI overtones.  Pain is gone now in ER Compliant with meds  @ROS @ All other systems reviewed and negative except as noted above  Past Medical History  Diagnosis Date  . Acid reflux   . Hypercholesteremia   . Thyroid disease     Family History  Problem Relation Age of Onset  . Hypertension Mother   . Coronary artery disease Father   . Heart disease Father   . Coronary artery disease Paternal Uncle   . Coronary artery disease Cousin     History   Social History  . Marital Status: Married    Spouse Name: N/A    Number of Children: N/A  . Years of Education: N/A   Occupational History  . Not on file.   Social History Main Topics  . Smoking status: Former Smoker    Types: Cigarettes    Quit date: 05/20/1991  . Smokeless tobacco: Not on file  . Alcohol Use: No  . Drug Use: No  . Sexually Active:    Other Topics Concern  . Not on file   Social History Narrative  . No narrative on file    Past Surgical History  Procedure Date  . Abdominal hysterectomy   .  Cholecystectomy   . Wrist surgery         . aspirin  324 mg Oral Once  . aspirin  325 mg Oral Daily  . citalopram  20 mg Oral Daily  . enoxaparin (LOVENOX) injection  40 mg Subcutaneous Q24H  . levothyroxine  50 mcg Oral Daily  . loratadine  10 mg Oral Daily  . morphine  4 mg Intravenous Once  . ondansetron  4 mg Intravenous Once  . ondansetron  4 mg Intravenous Once  . pantoprazole  40 mg Oral Q1200  . potassium chloride SA  40 mEq Oral Once  . simvastatin  10 mg Oral q1800  . sodium chloride  3 mL Intravenous Q12H      Physical Exam: Blood pressure 121/62, pulse 63, temperature 98 F (36.7 C), temperature source Oral, resp. rate 20, height 5\' 4"  (1.626 m), weight 97.523 kg (215 lb), SpO2 99.00%.   Affect depressed Healthy:  appears stated age HEENT: normal Neck supple with no adenopathy JVP normal no bruits no thyromegaly Lungs clear with no wheezing and good diaphragmatic motion Heart:  S1/S2 no murmur, no rub, gallop or click PMI normal Abdomen: benighn, BS positve, no tenderness, no AAA no bruit.  No HSM or HJR Distal pulses intact with no bruits No edema Neuro non-focal Skin warm and  dry No muscular weakness   Labs:   Lab Results  Component Value Date   WBC 7.7 01/17/2012   HGB 12.9 01/17/2012   HCT 37.4 01/17/2012   MCV 91.2 01/17/2012   PLT 232 01/17/2012    Lab 01/17/12 0214  NA 137  K 3.4*  CL 102  CO2 27  BUN 10  CREATININE 0.74  CALCIUM 10.1  PROT --  BILITOT --  ALKPHOS --  ALT --  AST --  GLUCOSE 157*   Lab Results  Component Value Date   CKTOTAL 107 01/17/2012   CKMB 2.3 01/17/2012   TROPONINI <0.30 01/17/2012    Lab Results  Component Value Date   CHOL 220* 01/17/2012   Lab Results  Component Value Date   HDL 48 01/17/2012   Lab Results  Component Value Date   LDLCALC 133* 01/17/2012   Lab Results  Component Value Date   TRIG 195* 01/17/2012   Lab Results  Component Value Date   CHOLHDL 4.6 01/17/2012   No results found for this  basename: LDLDIRECT      Radiology: Dg Chest Portable 1 View  01/17/2012  *RADIOLOGY REPORT*  Clinical Data: Chest pain.  PORTABLE CHEST - 1 VIEW  Comparison: Chest radiograph 05/21/2011  Findings: The lungs are well-aerated.  Pulmonary vascularity is at the upper limits of normal.  There is no evidence of focal opacification, pleural effusion or pneumothorax.  The cardiomediastinal silhouette is within normal limits.  No acute osseous abnormalities are seen.  IMPRESSION: No acute cardiopulmonary process seen.  Original Report Authenticated By: Tonia Ghent, M.D.    EKG: NSR rate 75 LAE ICRBBB no change from 2011   ASSESSMENT AND PLAN:  Chest Pain:  Atypical ok to D/C form ER if 2nd set enzymes negative.  Will arrange outpatient F/U and GXT Chol:  Continue statin labs with Dr Robynn Pane Thryroid:  Continue replacement TSH with priamry  Signed: Charlton Haws 01/17/2012, 9:57 AM

## 2012-01-17 NOTE — ED Notes (Signed)
Report given to Darel Hong, RN unit 300 ready to receive patient.

## 2012-01-17 NOTE — H&P (Signed)
Candice Stanley is an 46 y.o. female.    PCP: Toma Deiters, MD   Chief Complaint: Chest pain  HPI: This is a 46 year old, Caucasian female, who has hypercholesterolemia, hypothyroidism, obesity, who was in her usual state of health last night when she was lying on the bed and playing games on her computer. She started developing chest pain in the left chest area. It was 2/10 in intensity, described as a sharp pain. Lasted about 2 minutes and went away. And, then she started having pain in the left arm and the left armpit. She became nausea, and vomited once. Denies any shortness of breath. No history of fever, or cough. She did not take any medications for this problem. She tells me, that she has been kind of 'sluggish' for the last 2 days, and had dizziness yesterday. She does have a history of vertigo. She also has a history of acid reflux. She had cardiac catheterization in 1998, which was normal. She tells me, that she had a stress test about 5 years ago, which was also normal. Two years ago she had chest pain, and was seen by cardiology at [facility], and she was offered cardiac testing, however, she did not want to get it done, because of lack of insurance. She does tell me that she does get occasional chest pain. She also tells me, that she's been under a lot of stress recently. She lost her father earlier this year and is still very upset about it.   Home Medications: Prior to Admission medications   Medication Sig Start Date End Date Taking? Authorizing Provider  ALPRAZolam Prudy Feeler) 0.5 MG tablet Take 0.25 mg by mouth daily as needed. For anxiety     Historical Provider, MD  aspirin 325 MG tablet Take 325 mg by mouth daily.      Historical Provider, MD  citalopram (CELEXA) 20 MG tablet Take 20 mg by mouth daily.      Historical Provider, MD  levothyroxine (SYNTHROID, LEVOTHROID) 50 MCG tablet Take 50 mcg by mouth daily.      Historical Provider, MD  loratadine (CLARITIN) 10 MG tablet Take  10 mg by mouth daily.      Historical Provider, MD  lovastatin (MEVACOR) 20 MG tablet Take 20 mg by mouth daily.      Historical Provider, MD  ranitidine (ZANTAC) 150 MG tablet Take 300 mg by mouth at bedtime.      Historical Provider, MD    Allergies:  Allergies  Allergen Reactions  . Codeine     Past Medical History: Past Medical History  Diagnosis Date  . Acid reflux   . Hypercholesteremia   . Thyroid disease     Past Surgical History  Procedure Date  . Abdominal hysterectomy   . Cholecystectomy   . Wrist surgery     Social History:  reports that she quit smoking about 20 years ago. Her smoking use included Cigarettes. She does not have any smokeless tobacco history on file. She reports that she does not drink alcohol or use illicit drugs.  Family History:  Family History  Problem Relation Age of Onset  . Hypertension Mother   . Coronary artery disease Father   . Heart disease Father   . Coronary artery disease Paternal Uncle   . Coronary artery disease Cousin     Review of Systems - History obtained from the patient General ROS: negative Psychological ROS: positive for - anxiety Ophthalmic ROS: negative ENT ROS: negative Allergy and Immunology  ROS: negative Hematological and Lymphatic ROS: negative Endocrine ROS: negative Respiratory ROS: no cough, shortness of breath, or wheezing Cardiovascular ROS: as in hpi Gastrointestinal ROS: no abdominal pain, change in bowel habits, or black or bloody stools Genito-Urinary ROS: no dysuria, trouble voiding, or hematuria Musculoskeletal ROS: negative Neurological ROS: no TIA or stroke symptoms Dermatological ROS: negative  Physical Examination Blood pressure 120/65, pulse 78, temperature 98 F (36.7 C), temperature source Oral, resp. rate 17, height 5\' 4"  (1.626 m), weight 97.523 kg (215 lb), SpO2 98.00%.  General appearance: alert, cooperative, appears stated age, no distress and moderately obese Head:  Normocephalic, without obvious abnormality, atraumatic Eyes: conjunctivae/corneas clear. PERRL, EOM's intact.  Throat: lips, mucosa, and tongue normal; teeth and gums normal Neck: no adenopathy, no carotid bruit, no JVD, supple, symmetrical, trachea midline and thyroid not enlarged, symmetric, no tenderness/mass/nodules Back: symmetric, no curvature. ROM normal. No CVA tenderness. Resp: clear to auscultation bilaterally Cardio: regular rate and rhythm, S1, S2 normal, no murmur, click, rub or gallop GI: soft, non-tender; bowel sounds normal; no masses,  no organomegaly Extremities: extremities normal, atraumatic, no cyanosis or edema Pulses: 2+ and symmetric Skin: Skin color, texture, turgor normal. No rashes or lesions Lymph nodes: Cervical, supraclavicular, and axillary nodes normal. Neurologic: She is alert and oriented x3. No focal neurological deficits are present  Laboratory Data: Results for orders placed during the hospital encounter of 01/17/12 (from the past 48 hour(s))  TROPONIN I     Status: Normal   Collection Time   01/17/12  2:14 AM      Component Value Range Comment   Troponin I <0.30  <0.30 ng/mL   CBC WITH DIFFERENTIAL     Status: Normal   Collection Time   01/17/12  2:14 AM      Component Value Range Comment   WBC 7.7  4.0 - 10.5 K/uL    RBC 4.10  3.87 - 5.11 MIL/uL    Hemoglobin 12.9  12.0 - 15.0 g/dL    HCT 81.1  91.4 - 78.2 %    MCV 91.2  78.0 - 100.0 fL    MCH 31.5  26.0 - 34.0 pg    MCHC 34.5  30.0 - 36.0 g/dL    RDW 95.6  21.3 - 08.6 %    Platelets 232  150 - 400 K/uL    Neutrophils Relative 49  43 - 77 %    Neutro Abs 3.7  1.7 - 7.7 K/uL    Lymphocytes Relative 44  12 - 46 %    Lymphs Abs 3.4  0.7 - 4.0 K/uL    Monocytes Relative 6  3 - 12 %    Monocytes Absolute 0.5  0.1 - 1.0 K/uL    Eosinophils Relative 1  0 - 5 %    Eosinophils Absolute 0.1  0.0 - 0.7 K/uL    Basophils Relative 0  0 - 1 %    Basophils Absolute 0.0  0.0 - 0.1 K/uL   BASIC METABOLIC  PANEL     Status: Abnormal   Collection Time   01/17/12  2:14 AM      Component Value Range Comment   Sodium 137  135 - 145 mEq/L    Potassium 3.4 (*) 3.5 - 5.1 mEq/L    Chloride 102  96 - 112 mEq/L    CO2 27  19 - 32 mEq/L    Glucose, Bld 157 (*) 70 - 99 mg/dL    BUN 10  6 -  23 mg/dL    Creatinine, Ser 1.30  0.50 - 1.10 mg/dL    Calcium 86.5  8.4 - 10.5 mg/dL    GFR calc non Af Amer >90  >90 mL/min    GFR calc Af Amer >90  >90 mL/min   POCT I-STAT TROPONIN I     Status: Normal   Collection Time   01/17/12  2:18 AM      Component Value Range Comment   Troponin i, poc 0.00  0.00 - 0.08 ng/mL    Comment 3              Radiology Reports: Dg Chest Portable 1 View  01/17/2012  *RADIOLOGY REPORT*  Clinical Data: Chest pain.  PORTABLE CHEST - 1 VIEW  Comparison: Chest radiograph 05/21/2011  Findings: The lungs are well-aerated.  Pulmonary vascularity is at the upper limits of normal.  There is no evidence of focal opacification, pleural effusion or pneumothorax.  The cardiomediastinal silhouette is within normal limits.  No acute osseous abnormalities are seen.  IMPRESSION: No acute cardiopulmonary process seen.  Original Report Authenticated By: Tonia Ghent, M.D.    Electrocardiogram: Normal sinus rhythm at 75 beats per minute. Normal axis. Intervals appear to be normal. No Q waves. No concerning ST changes are noted. There is T inversion in lead 3. The EKG is similar to one from 2012.  Assessment/Plan  Principal Problem:  *CHEST PAIN UNSPECIFIED Active Problems:  Unspecified hypothyroidism  HYPERLIPIDEMIA  Esophageal reflux   #1 chest pain: Differential diagnoses include acute coronary syndrome/angina, related to acid reflux. We will observe her in the hospital and rule her out for acute coronary syndrome. Echocardiogram will be obtained. She'll be maintained on aspirin. Protonix will be instituted. Cardiology will be consulted to evaluate her considering her significant family history  of heart disease.  #2 history of hypothyroidism: Continue with Synthroid.  #3 history of hyperlipidemia. Continue with her lipid-lowering agent. Lipid panel will be checked.  #4. GERD: PPI.  DVT, prophylaxis will be initiated.  She is a full code.  Further management decisions will depend on results of further testing and patient's response to treatment.  [facility]  [facility] Pager [phone]  01/17/2012, 5:16 AM

## 2012-01-17 NOTE — Progress Notes (Signed)
Discharge instructions given to pt. With teach back given. Pt. Taken to car via W/C.

## 2012-01-17 NOTE — ED Notes (Signed)
Pt c/o left axilla pain that is sharp in nature at times. Dr. Sherrie Mustache called and made aware and new order obtained.

## 2012-01-17 NOTE — Discharge Summary (Signed)
Physician Discharge Summary  JOSSALYN FORGIONE MRN: [id number] DOB/AGE: Jul 15, 1965 46 y.o.  PCP: Toma Deiters, MD   Admit date: 01/17/2012 Discharge date: 01/17/2012  Discharge Diagnoses:  1. Atypical chest pain. Cardiac enzymes within normal limits. 2. Hyperlipidemia. Cholesterol panel revealed a total cholesterol of 220, triglycerides of 195, HDL cholesterol 48, and LDL cholesterol 133. 3. Esophageal reflux. 4. Hypothyroidism. 5. Mild frontal headache. 6. Mild hypokalemia. 7. Mild hyperglycemia, nonfasting.   Medication List  As of 01/17/2012  3:55 PM   TAKE these medications         ALPRAZolam 0.5 MG tablet   Commonly known as: XANAX   Take 0.25 mg by mouth daily as needed. For anxiety      aspirin EC 325 MG tablet   Take 325 mg by mouth daily.      citalopram 20 MG tablet   Commonly known as: CELEXA   Take 20 mg by mouth daily.      CLARITIN 10 MG tablet   Generic drug: loratadine   Take 10 mg by mouth daily.      levothyroxine 50 MCG tablet   Commonly known as: SYNTHROID, LEVOTHROID   Take 50 mcg by mouth daily.      lovastatin 40 MG tablet   Commonly known as: MEVACOR   Take 1 tablet (40 mg total) by mouth daily.      ranitidine 150 MG tablet   Commonly known as: ZANTAC   Take 150 mg by mouth 2 (two) times daily.            Discharge Condition: Improved.  Disposition: 01-Home or Self Care   Consults: Charlton Haws, M.D.   Significant Diagnostic Studies: Dg Chest Portable 1 View  01/17/2012  *RADIOLOGY REPORT*  Clinical Data: Chest pain.  PORTABLE CHEST - 1 VIEW  Comparison: Chest radiograph 05/21/2011  Findings: The lungs are well-aerated.  Pulmonary vascularity is at the upper limits of normal.  There is no evidence of focal opacification, pleural effusion or pneumothorax.  The cardiomediastinal silhouette is within normal limits.  No acute osseous abnormalities are seen.  IMPRESSION: No acute cardiopulmonary process seen.  Original Report  Authenticated By: Tonia Ghent, M.D.     Microbiology: No results found for this or any previous visit (from the past 240 hour(s)).   Labs: Results for orders placed during the hospital encounter of 01/17/12 (from the past 48 hour(s))  TROPONIN I     Status: Normal   Collection Time   01/17/12  2:14 AM      Component Value Range Comment   Troponin I <0.30  <0.30 ng/mL   CBC WITH DIFFERENTIAL     Status: Normal   Collection Time   01/17/12  2:14 AM      Component Value Range Comment   WBC 7.7  4.0 - 10.5 K/uL    RBC 4.10  3.87 - 5.11 MIL/uL    Hemoglobin 12.9  12.0 - 15.0 g/dL    HCT 40.9  81.1 - 91.4 %    MCV 91.2  78.0 - 100.0 fL    MCH 31.5  26.0 - 34.0 pg    MCHC 34.5  30.0 - 36.0 g/dL    RDW 78.2  95.6 - 21.3 %    Platelets 232  150 - 400 K/uL    Neutrophils Relative 49  43 - 77 %    Neutro Abs 3.7  1.7 - 7.7 K/uL    Lymphocytes Relative 44  12 -  46 %    Lymphs Abs 3.4  0.7 - 4.0 K/uL    Monocytes Relative 6  3 - 12 %    Monocytes Absolute 0.5  0.1 - 1.0 K/uL    Eosinophils Relative 1  0 - 5 %    Eosinophils Absolute 0.1  0.0 - 0.7 K/uL    Basophils Relative 0  0 - 1 %    Basophils Absolute 0.0  0.0 - 0.1 K/uL   BASIC METABOLIC PANEL     Status: Abnormal   Collection Time   01/17/12  2:14 AM      Component Value Range Comment   Sodium 137  135 - 145 mEq/L    Potassium 3.4 (*) 3.5 - 5.1 mEq/L    Chloride 102  96 - 112 mEq/L    CO2 27  19 - 32 mEq/L    Glucose, Bld 157 (*) 70 - 99 mg/dL    BUN 10  6 - 23 mg/dL    Creatinine, Ser 1.47  0.50 - 1.10 mg/dL    Calcium 82.9  8.4 - 10.5 mg/dL    GFR calc non Af Amer >90  >90 mL/min    GFR calc Af Amer >90  >90 mL/min   POCT I-STAT TROPONIN I     Status: Normal   Collection Time   01/17/12  2:18 AM      Component Value Range Comment   Troponin i, poc 0.00  0.00 - 0.08 ng/mL    Comment 3            LIPID PANEL     Status: Abnormal   Collection Time   01/17/12  5:22 AM      Component Value Range Comment   Cholesterol 220  (*) 0 - 200 mg/dL    Triglycerides 562 (*) <150 mg/dL    HDL 48  >13 mg/dL    Total CHOL/HDL Ratio 4.6      VLDL 39  0 - 40 mg/dL    LDL Cholesterol 086 (*) 0 - 99 mg/dL   CARDIAC PANEL(CRET KIN+CKTOT+MB+TROPI)     Status: Normal   Collection Time   01/17/12  6:14 AM      Component Value Range Comment   Total CK 107  7 - 177 U/L    CK, MB 2.3  0.3 - 4.0 ng/mL    Troponin I <0.30  <0.30 ng/mL    Relative Index 2.1  0.0 - 2.5   CARDIAC PANEL(CRET KIN+CKTOT+MB+TROPI)     Status: Normal   Collection Time   01/17/12  1:51 PM      Component Value Range Comment   Total CK 90  7 - 177 U/L    CK, MB 2.1  0.3 - 4.0 ng/mL    Troponin I <0.30  <0.30 ng/mL    Relative Index RELATIVE INDEX IS INVALID  0.0 - 2.5      HPI : The patient is a 46 year old woman with a history significant for hyperlipidemia, hypothyroidism, and obesity, who presented to the emergency department early this morning with a chief complaint of chest pain. In the emergency department, she was noted to be afebrile and hemodynamically stable. She is oxygenating 98% on room air. Her lab data were significant for a normal troponin I., potassium of 3.4, and glucose of 157. Her chest x-ray revealed no acute cardiopulmonary disease. Her EKG revealed normal sinus rhythm with a heart rate of 75 beats per minute and no concerning ST abnormalities.  She was admitted for further evaluation and management.  HOSPITAL COURSE: The patient's chest pain appeared to be atypical. She was started on as needed analgesics and as needed antiemetics. She was continued on all of her chronic medications. Aspirin therapy was continued. Proton pump inhibitor therapy was started. Ranitidine was temporarily withheld. For further evaluation, cardiac enzymes and a fasting lipid profile were ordered. All of her cardiac enzymes were within normal limits including normal troponin I. X3. The results of her fasting lipid profile were dictated above. Cardiologist, Dr. Eden Emms  was consulted. Following his assessment, he believed that the patient's chest pain was atypical. If her second set of cardiac enzymes are negative, she can be discharged to home. As mentioned above, all of her cardiac enzymes were within normal limits. Dr. Eden Emms arranged for the patient to undergo an outpatient stress test on July 17th. The patient was aware of this. She was discharged to home in improved and stable condition. Prior to discharge, she was instructed to increase lovastatin from 20 mg daily to 40 mg daily.    Discharge Exam:  Blood pressure 120/75, pulse 68, temperature 98.1 F (36.7 C), temperature source Oral, resp. rate 16, height 5\' 4"  (1.626 m), weight 99.791 kg (220 lb), SpO2 95.00%. Lungs: Clear to auscultation bilaterally. Heart: S1, S2, with no murmurs rubs or gallops. Abdomen: Obese, positive bowel sounds, soft, nontender, nondistended. Extremities: No pedal edema. Pedal pulses palpable.    Discharge Orders    Future Appointments: Provider: Department: Dept Phone: Center:   01/25/2012 11:00 AM [phone] [facility]     Future Orders Please Complete By Expires   Diet - low sodium heart healthy      Increase activity slowly         Follow-up Information    Schedule an appointment as soon as possible for a visit with HASANAJ,XAJE A, MD.        Total discharge time: Greater than 35 minutes. Signed: Lannie Heaps 01/17/2012, 3:55 PM

## 2012-01-17 NOTE — ED Notes (Signed)
C/o left sided chest pain that radiates to left arm and neck onset 2 hours ago while lying in bed; reports n/v with pain.

## 2012-01-17 NOTE — ED Notes (Signed)
Pt states she is still very hot. Pt sitting on the side of the bed with a cool washcloth placed on her head. A fan was placed on bedside table to assist in cooling pt off. NAD noted at this time.

## 2012-01-17 NOTE — ED Notes (Signed)
Patient states the pain medication make her nauseous. Patient medicated with 4 mg Zofran PO per inpatient PRN order. Patient agrees with plan.

## 2012-01-17 NOTE — Progress Notes (Signed)
UR Chart Review Completed  

## 2012-01-25 ENCOUNTER — Ambulatory Visit (INDEPENDENT_AMBULATORY_CARE_PROVIDER_SITE_OTHER): Payer: Self-pay | Admitting: Cardiology

## 2012-01-25 ENCOUNTER — Encounter (HOSPITAL_COMMUNITY): Payer: Self-pay | Admitting: Cardiology

## 2012-01-25 ENCOUNTER — Ambulatory Visit (HOSPITAL_COMMUNITY)
Admit: 2012-01-25 | Discharge: 2012-01-25 | Disposition: A | Discharge status: Home / Self Care | Payer: Self-pay | Attending: Internal Medicine | Admitting: Internal Medicine

## 2012-01-25 DIAGNOSIS — I059 Rheumatic mitral valve disease, unspecified: Secondary | ICD-10-CM | POA: Insufficient documentation

## 2012-01-25 DIAGNOSIS — R072 Precordial pain: Secondary | ICD-10-CM | POA: Insufficient documentation

## 2012-01-25 DIAGNOSIS — E785 Hyperlipidemia, unspecified: Secondary | ICD-10-CM | POA: Insufficient documentation

## 2012-01-25 DIAGNOSIS — R079 Chest pain, unspecified: Secondary | ICD-10-CM

## 2012-01-25 DIAGNOSIS — K449 Diaphragmatic hernia without obstruction or gangrene: Secondary | ICD-10-CM

## 2012-01-25 NOTE — Progress Notes (Addendum)
Stress Lab Nurses Notes - [facility]  Candice Stanley 01/25/2012  Reason for doing test: Chest Pain  Type of test: Regular GTX  Nurse performing test: Marlena Clipper, RN  Nuclear Medicine Tech: Not Applicable  Echo Tech: Not Applicable  MD performing test: Herma Carson  Family MD: Lia Hopping  Test explained and consent signed: yes  IV started: No IV started  Symptoms: Legs tired and shortness of breath  Treatment/Intervention: None  Reason test stopped: reached target HR  After recovery IV was: No IV started  Patient to return to Nuc. Med at :  Patient discharged: Home  Patient's Condition upon discharge was: stable  Comments: Patient walked 7 minutes and 2 seconds. Her resting HR was 83 and resting BP was 130/70. Her peak hr was 155 and her peak BP was 160/92. Symptoms resolved in recovery  [name]  Graded Exercise Test-Interpretation      Graded exercise performed to a work load of 8.7 METs and a heart rate of 155, 89% of age-predicted maximum.  Exercise discontinued due to dyspnea and fatigue; no chest discomfort reported.  Blood pressure increased from a resting value of 130/70 to 150/90 at peak exercise and 160/90 in recovery, and normal response.  No arrhythmias noted.  EKG: normal sinus rhythm; low voltage in the chest leads; slightly delayed R-wave progression; right ventricular conduction delay. Stress EKG:  Interpretation impaired by baseline wander.  Although the computer averaged tracings suggested borderline significant flat ST segment depression, on the interpretable raw EKGs, only insignificant upsloping depression was identified in recovery, there was rapid reversion towards baseline.. Impression:  Negative and adequate stress EKG revealing impaired exercise capacity considering the patient's young age, a normal blood pressure response, no arrhythmias and a normal electrocardiographic response.  Other findings as noted.  [name], M.D.

## 2012-01-25 NOTE — Progress Notes (Signed)
*  PRELIMINARY RESULTS* Echocardiogram 2D Echocardiogram has been performed.  Candice Stanley 01/25/2012, 9:40 AM

## 2012-02-03 ENCOUNTER — Encounter: Payer: Self-pay | Admitting: Adult Health

## 2012-02-07 ENCOUNTER — Ambulatory Visit: Payer: Self-pay | Admitting: Adult Health

## 2012-03-05 ENCOUNTER — Emergency Department (HOSPITAL_COMMUNITY): Payer: Worker's Compensation

## 2012-03-05 ENCOUNTER — Encounter (HOSPITAL_COMMUNITY): Payer: Self-pay | Admitting: *Deleted

## 2012-03-05 ENCOUNTER — Emergency Department (HOSPITAL_COMMUNITY)
Admission: EM | Admit: 2012-03-05 | Discharge: 2012-03-05 | Disposition: A | Discharge status: Home / Self Care | Payer: Worker's Compensation | Attending: Emergency Medicine | Admitting: Emergency Medicine

## 2012-03-05 DIAGNOSIS — S60219A Contusion of unspecified wrist, initial encounter: Secondary | ICD-10-CM | POA: Insufficient documentation

## 2012-03-05 DIAGNOSIS — E079 Disorder of thyroid, unspecified: Secondary | ICD-10-CM | POA: Insufficient documentation

## 2012-03-05 DIAGNOSIS — W230XXA Caught, crushed, jammed, or pinched between moving objects, initial encounter: Secondary | ICD-10-CM | POA: Insufficient documentation

## 2012-03-05 DIAGNOSIS — K219 Gastro-esophageal reflux disease without esophagitis: Secondary | ICD-10-CM | POA: Insufficient documentation

## 2012-03-05 DIAGNOSIS — S60212A Contusion of left wrist, initial encounter: Secondary | ICD-10-CM

## 2012-03-05 DIAGNOSIS — E7800 Pure hypercholesterolemia, unspecified: Secondary | ICD-10-CM | POA: Insufficient documentation

## 2012-03-05 DIAGNOSIS — Z87891 Personal history of nicotine dependence: Secondary | ICD-10-CM | POA: Insufficient documentation

## 2012-03-05 NOTE — ED Notes (Addendum)
Pain lt wrist after being hit with a door  At work.  Small contusion and swelling present.  Ice pack applied.  Heavy door closed on pts wrist.

## 2012-03-05 NOTE — ED Notes (Signed)
Splint caused increased pain ,  Pt left it loose.  Alert. NAD.

## 2012-03-05 NOTE — ED Notes (Signed)
Pt with left wrist pain after a car door closed on it

## 2012-03-05 NOTE — ED Provider Notes (Signed)
History     CSN: [id number]  Arrival date & time 03/05/12  1152   First MD Initiated Contact with Patient 03/05/12 1211      Chief Complaint  Patient presents with  . Wrist Pain    (Consider location/radiation/quality/duration/timing/severity/associated sxs/prior treatment) HPI Comments: A heavy bathroom door slammed on her L wrist   Patient is a 46 y.o. female presenting with wrist pain. The history is provided by the patient. No language interpreter was used.  Wrist Pain This is a new problem. The current episode started today. The problem occurs constantly. The problem has been unchanged. Pertinent negatives include no numbness or weakness. Exacerbated by: movement and palpation. She has tried nothing for the symptoms.    Past Medical History  Diagnosis Date  . Acid reflux   . Hypercholesteremia   . Thyroid disease     Past Surgical History  Procedure Date  . Abdominal hysterectomy   . Cholecystectomy   . Wrist surgery     Family History  Problem Relation Age of Onset  . Hypertension Mother   . Coronary artery disease Father   . Heart disease Father   . Coronary artery disease Paternal Uncle   . Coronary artery disease Cousin     History  Substance Use Topics  . Smoking status: Former Smoker    Types: Cigarettes    Quit date: 05/20/1991  . Smokeless tobacco: Not on file  . Alcohol Use: No    OB History    Grav Para Term Preterm Abortions TAB SAB Ect Mult Living                  Review of Systems  Musculoskeletal:       Wrist injury   Skin: Negative for wound.  Neurological: Negative for weakness and numbness.  All other systems reviewed and are negative.    Allergies  Codeine and Vicodin  Home Medications   Current Outpatient Rx  Name Route Sig Dispense Refill  . ALPRAZOLAM 0.5 MG PO TABS Oral Take 0.25 mg by mouth daily as needed. For anxiety     . ASPIRIN EC 325 MG PO TBEC Oral Take 325 mg by mouth daily.    Marland Kitchen CITALOPRAM  HYDROBROMIDE 20 MG PO TABS Oral Take 20 mg by mouth daily.      Marland Kitchen LEVOTHYROXINE SODIUM 50 MCG PO TABS Oral Take 50 mcg by mouth daily.     Marland Kitchen LOVASTATIN 20 MG PO TABS Oral Take 20 mg by mouth daily.    Marland Kitchen RANITIDINE HCL 150 MG PO TABS Oral Take 300 mg by mouth at bedtime.     Marland Kitchen LORATADINE 10 MG PO TABS Oral Take 10 mg by mouth daily as needed. Seasonal allergies      BP 134/64  Pulse 60  Temp 98.3 F (36.8 C) (Oral)  Resp 20  Ht 5\' 4"  (1.626 m)  Wt 220 lb (99.791 kg)  BMI 37.76 kg/m2  SpO2 100%  Physical Exam  Nursing note and vitals reviewed. Constitutional: She is oriented to person, place, and time. She appears well-developed and well-nourished. No distress.  HENT:  Head: Normocephalic and atraumatic.  Eyes: EOM are normal.  Neck: Normal range of motion.  Cardiovascular: Normal rate, regular rhythm and normal heart sounds.   Pulmonary/Chest: Effort normal and breath sounds normal.  Abdominal: Soft. She exhibits no distension. There is no tenderness.  Musculoskeletal: She exhibits tenderness.       Right wrist: She exhibits decreased range of  motion, tenderness, bony tenderness and swelling. She exhibits no crepitus, no deformity and no laceration.       Pain with movement and palpation.  Neurological: She is alert and oriented to person, place, and time.  Skin: Skin is warm and dry.  Psychiatric: She has a normal mood and affect. Judgment normal.    ED Course  Procedures (including critical care time)  Labs Reviewed - No data to display Dg Wrist Complete Left  03/05/2012  *RADIOLOGY REPORT*  Clinical Data: Pain and swelling after injury.  LEFT WRIST - COMPLETE 3+ VIEW  Comparison: None.  Findings: No acute osseous or joint abnormality.  IMPRESSION: No acute osseous or joint abnormality.   Original Report Authenticated By: Reyes Ivan, M.D.      1. Contusion of left wrist       MDM  Wrist splint Ice Ibuprofen F/u with dr. Amanda Pea.        Evalina Field, [place] 03/05/12 1342

## 2012-03-05 NOTE — ED Provider Notes (Signed)
Medical screening examination/treatment/procedure(s) were performed by non-physician practitioner and as supervising physician I was immediately available for consultation/collaboration. Devoria Albe, MD, Armando Gang   Ward Givens, MD 03/05/12 [phone]

## 2012-04-15 ENCOUNTER — Telehealth: Payer: Self-pay | Admitting: Internal Medicine

## 2012-04-15 ENCOUNTER — Encounter (HOSPITAL_COMMUNITY): Payer: Self-pay

## 2012-04-15 ENCOUNTER — Emergency Department (HOSPITAL_COMMUNITY)
Admission: EM | Admit: 2012-04-15 | Discharge: 2012-04-15 | Disposition: A | Discharge status: Home / Self Care | Payer: Self-pay | Attending: Emergency Medicine | Admitting: Emergency Medicine

## 2012-04-15 DIAGNOSIS — T18108A Unspecified foreign body in esophagus causing other injury, initial encounter: Secondary | ICD-10-CM | POA: Insufficient documentation

## 2012-04-15 DIAGNOSIS — K219 Gastro-esophageal reflux disease without esophagitis: Secondary | ICD-10-CM | POA: Insufficient documentation

## 2012-04-15 DIAGNOSIS — R1310 Dysphagia, unspecified: Secondary | ICD-10-CM

## 2012-04-15 DIAGNOSIS — IMO0002 Reserved for concepts with insufficient information to code with codable children: Secondary | ICD-10-CM | POA: Insufficient documentation

## 2012-04-15 DIAGNOSIS — E7800 Pure hypercholesterolemia, unspecified: Secondary | ICD-10-CM | POA: Insufficient documentation

## 2012-04-15 DIAGNOSIS — Z87891 Personal history of nicotine dependence: Secondary | ICD-10-CM | POA: Insufficient documentation

## 2012-04-15 DIAGNOSIS — E079 Disorder of thyroid, unspecified: Secondary | ICD-10-CM | POA: Insufficient documentation

## 2012-04-15 MED ORDER — GI COCKTAIL ~~LOC~~
30.0000 mL | Freq: Once | ORAL | Status: AC
  Administered 2012-04-15: 30 mL via ORAL
  Filled 2012-04-15: qty 30

## 2012-04-15 MED ORDER — SUCRALFATE 1 GM/10ML PO SUSP: 1.0000 g | Freq: Four times a day (QID) | ORAL | Status: DC

## 2012-04-15 MED ORDER — FAMOTIDINE 20 MG PO TABS
20.0000 mg | ORAL_TABLET | Freq: Once | ORAL | Status: AC
  Administered 2012-04-15: 20 mg via ORAL
  Filled 2012-04-15: qty 1

## 2012-04-15 NOTE — ED Provider Notes (Signed)
History   This chart was scribed for Candice Roots, MD by Charolett Bumpers . The patient was seen in room [facility]11/[facility]11. Patient's care was started at 1329.    CSN: [id number]  Arrival date & time 04/15/12  1302   First MD Initiated Contact with Patient 04/15/12 1329      Chief Complaint  Patient presents with  . Foreign Body    (Consider location/radiation/quality/duration/timing/severity/associated sxs/prior treatment) The history is provided by the patient.   Candice Stanley is a 46 y.o. female who presents to the Emergency Department complaining of fb sensation upper chest/sternal notch area since this past Thursday.  Pt reports that she ate a piece of raw potato when the symptoms started. Pt reports that she has a burning sensation with swallowing. She states that if she burps, she has a stabbing sensation. Pt denies any h/o similar symptoms. She states that she has been able to eat and drink normally without any trouble swallowing. She reports taking Maalox, Tums and nexium without relief. She has a h/o acid reflux. She denies any coughing or choking. No sob. No previous similar symptoms or hx esophageal reflux. She denies ever getting an endoscopy. No fever or chills. Hx cholecystectomy.     Past Medical History  Diagnosis Date  . Acid reflux   . Hypercholesteremia   . Thyroid disease     Past Surgical History  Procedure Date  . Abdominal hysterectomy   . Cholecystectomy   . Wrist surgery     Family History  Problem Relation Age of Onset  . Hypertension Mother   . Coronary artery disease Father   . Heart disease Father   . Coronary artery disease Paternal Uncle   . Coronary artery disease Cousin     History  Substance Use Topics  . Smoking status: Former Smoker    Types: Cigarettes    Quit date: 05/20/1991  . Smokeless tobacco: Not on file  . Alcohol Use: No    OB History    Grav Para Term Preterm Abortions TAB SAB Ect Mult Living                   Review of Systems  Constitutional: Negative for fever.  HENT: Negative for sore throat and trouble swallowing.   Respiratory: Negative for cough and choking.   Cardiovascular: Negative for chest pain.  Gastrointestinal: Negative for nausea, vomiting and abdominal pain.  Musculoskeletal: Negative for back pain.  All other systems reviewed and are negative.    Allergies  Codeine and Vicodin  Home Medications   Current Outpatient Rx  Name Route Sig Dispense Refill  . ALPRAZOLAM 0.5 MG PO TABS Oral Take 0.25 mg by mouth daily as needed. For anxiety     . ASPIRIN EC 325 MG PO TBEC Oral Take 325 mg by mouth daily.    Marland Kitchen CITALOPRAM HYDROBROMIDE 20 MG PO TABS Oral Take 20 mg by mouth daily.      Marland Kitchen LEVOTHYROXINE SODIUM 50 MCG PO TABS Oral Take 50 mcg by mouth daily.     Marland Kitchen LORATADINE 10 MG PO TABS Oral Take 10 mg by mouth daily as needed. Seasonal allergies    . LOVASTATIN 20 MG PO TABS Oral Take 20 mg by mouth daily.    Marland Kitchen RANITIDINE HCL 150 MG PO TABS Oral Take 300 mg by mouth at bedtime.       BP 124/74  Pulse 68  Temp 98.3 F (36.8 C) (Oral)  Resp 20  Ht 5\' 4"  (1.626 m)  Wt 210 lb (95.255 kg)  BMI 36.05 kg/m2  SpO2 100%  Physical Exam  Nursing note and vitals reviewed. Constitutional: She is oriented to person, place, and time. She appears well-developed and well-nourished.  HENT:  Head: Normocephalic and atraumatic.  Nose: Nose normal.  Mouth/Throat: Oropharynx is clear and moist.  Eyes: Conjunctivae normal and EOM are normal. No scleral icterus.  Neck: Normal range of motion. Neck supple. No tracheal deviation present.       No neck masses or swelling.   Cardiovascular: Normal rate, regular rhythm and normal heart sounds.   No murmur heard. Pulmonary/Chest: Effort normal and breath sounds normal. No respiratory distress. She has no wheezes.       No stridor  Abdominal: Soft. Bowel sounds are normal. She exhibits no distension. There is no tenderness.   Musculoskeletal: Normal range of motion. She exhibits no edema and no tenderness.  Neurological: She is alert and oriented to person, place, and time. No cranial nerve deficit.  Skin: Skin is warm and dry. No rash noted.  Psychiatric: She has a normal mood and affect. Her behavior is normal.    ED Course  Procedures (including critical care time)  DIAGNOSTIC STUDIES: Oxygen Saturation is 100% on room air, normal by my interpretation.    COORDINATION OF CARE:  13:38-Discussed planned course of treatment with the patient including a GI cocktail, Pepcid and consultation to GI, who is agreeable at this time.   13:45-Medication Orders: GI cocktail (Maalox, Lidocaine, Donnatal)-once; Famotidine (Pepcid) tablet 20 mg-once.      MDM  I personally performed the services described in this documentation, which was scribed in my presence. The recorded information has been reviewed and considered. Candice Roots, MD   Discussed pt with Dr Jena Gauss, on call for gi today, incl pt w persistent fb sens since eating piece of potato 3 days ago.   He indicates no benefit to esophagram today, to try rx for carafate at home for symptom relief, and that his office will call pt by 9 am tomorrow w appt for office visit there tomorrow, they will decide on upper endoscopy then, clear liquid diet for now.         Candice Roots, MD 04/15/12 1359

## 2012-04-15 NOTE — ED Notes (Signed)
Pt states that Thursday night she ate a piece of a raw potato and feels like part of it "got stuck". Pt c/o burning and discomfort since. Pt has hx of GERD. Pt denies any difficulty breathing. Pt has tried OTC meds but no relief met.

## 2012-04-15 NOTE — Telephone Encounter (Signed)
Dr. Denton Lank in the [facility] and ED called me about this lady.  Having some dysphagia issues recently; swallowed a piece of raw potato recently and did not feel it went all the way down. Having some persisting symptoms of esophageal dysphagia but has been basically tolerating a full liquid diet. No prior investigation. I will arrange for her to be seen in expedited fashion in our office. The ED physician states [phone] is a good number

## 2012-04-15 NOTE — ED Notes (Signed)
Pt ate piece of raw potato on Thursday, throat has been hurting since, has burning in throat area, feels like still hung or has irritated her throat. No difficulty w/ secreations

## 2012-04-16 ENCOUNTER — Encounter (HOSPITAL_COMMUNITY): Payer: Self-pay

## 2012-04-16 ENCOUNTER — Encounter: Payer: Self-pay | Admitting: Urgent Care

## 2012-04-16 ENCOUNTER — Ambulatory Visit (INDEPENDENT_AMBULATORY_CARE_PROVIDER_SITE_OTHER): Payer: Self-pay | Admitting: Urgent Care

## 2012-04-16 VITALS — BP 119/72 | HR 57 | Temp 97.8°F | Ht 64.0 in | Wt 231.0 lb

## 2012-04-16 DIAGNOSIS — K219 Gastro-esophageal reflux disease without esophagitis: Secondary | ICD-10-CM

## 2012-04-16 DIAGNOSIS — R1310 Dysphagia, unspecified: Secondary | ICD-10-CM | POA: Insufficient documentation

## 2012-04-16 MED ORDER — DEXLANSOPRAZOLE 60 MG PO CPDR: 60.0000 mg | DELAYED_RELEASE_CAPSULE | Freq: Every day | ORAL | Status: DC

## 2012-04-16 NOTE — Progress Notes (Signed)
Faxed to PCP

## 2012-04-16 NOTE — Assessment & Plan Note (Addendum)
Candice Stanley is a pleasant 46 y.o. female with hx chronic refractory GERD on H2 blocker (Zantac) with solid-food dysphagia & odynophagia that started acutely after swallowing a potato.  Will need EGD with possible esophageal dilation with Dr. Jena Gauss to look for erosive esophagitis, esophageal web, ring, stricture, or Barrett's esophagus.  I have discussed risks & benefits which include, but are not limited to, bleeding, infection, perforation & drug reaction.  The patient agrees with this plan & written consent will be obtained.  She has been taking a lot of NSAIDs & Goodys which may have contributed to her symptoms as well.  Avoid NSAIDS or aspirin products, use tylenol instead. Dexilant 60mg  daily for acid reflux

## 2012-04-16 NOTE — Telephone Encounter (Signed)
Pt is aware of OV today with KJ at 130

## 2012-04-16 NOTE — Assessment & Plan Note (Signed)
See dysphagia. ?

## 2012-04-16 NOTE — Patient Instructions (Addendum)
Dexilant 60mg  daily for acid reflux EGD as soon as possible with possible "stretching" of your esophagus No Goodys or anti-inflammatories, use tylenol instead

## 2012-04-16 NOTE — Progress Notes (Signed)
Primary Care Physician:  Toma Deiters, MD Primary Gastroenterologist:  Dr. Jonette Eva  Chief Complaint  Patient presents with  . Dysphagia    hospital ED follow up    HPI:  Candice Stanley is a 46 y.o. female here as a new patient for evaluation of dysphagia, odynophagia & GERD.  4 days ago, she ate a raw potato while making stew.  She felt like it was stuck in her upper esophagus & back of her throat.  C/o dysphagia & odynophagia since that incident with solid foods.  No problems with liquids.  Hx chronic poorly-controlled GERD with heartburn & indigestion.  Symptoms are worse nocturnally & she tells me she usually vomits 3-4 times per night.  She has been avoiding spicy foods & has changed her diet.  She had been on Nexium a few years ago, more recently taking 2 Zantac OTCs for the past couple years.  She was given GI cocktail in ER with minimal relief.  She took a significant amount of Mylanta & tums which did not help.  She has been taking a significant amount of Goodys & Advil for headaches lately.  Her weight has been stable.   Denies any lower GI symptoms including constipation, diarrhea, rectal bleeding, or melena.  No recent antibiotics.  Past Medical History  Diagnosis Date  . Acid reflux   . Hypercholesteremia   . Thyroid disease     Past Surgical History  Procedure Date  . Abdominal hysterectomy   . Cholecystectomy   . Wrist surgery     Current Outpatient Prescriptions  Medication Sig Dispense Refill  . ALPRAZolam (XANAX) 0.5 MG tablet Take 0.25 mg by mouth daily as needed. For anxiety       . aspirin EC 325 MG tablet Take 325 mg by mouth daily.      . citalopram (CELEXA) 20 MG tablet Take 20 mg by mouth daily.        Marland Kitchen levothyroxine (SYNTHROID, LEVOTHROID) 50 MCG tablet Take 50 mcg by mouth daily.       Marland Kitchen loratadine (CLARITIN) 10 MG tablet Take 10 mg by mouth daily as needed. Seasonal allergies      . lovastatin (MEVACOR) 20 MG tablet Take 40 mg by mouth daily.       .  ranitidine (ZANTAC) 150 MG tablet Take 300 mg by mouth at bedtime.       Marland Kitchen dexlansoprazole (DEXILANT) 60 MG capsule Take 1 capsule (60 mg total) by mouth daily.  31 capsule  2  . dexlansoprazole (DEXILANT) 60 MG capsule Take 1 capsule (60 mg total) by mouth daily.  14 capsule  0  . sucralfate (CARAFATE) 1 GM/10ML suspension Take 10 mLs (1 g total) by mouth 4 (four) times daily. Take for the next 5 days.  420 mL  0    Allergies as of 04/16/2012 - Review Complete 04/16/2012  Allergen Reaction Noted  . Codeine Nausea And Vomiting 11/03/2009  . Vicodin (hydrocodone-acetaminophen) Nausea And Vomiting 03/05/2012    Family History  Problem Relation Age of Onset  . Hypertension Mother   . Coronary artery disease Father   . Heart disease Father   . Coronary artery disease Paternal Uncle   . Coronary artery disease Cousin   . Barrett's esophagus Father     History   Social History  . Marital Status: Married    Spouse Name: N/A    Number of Children: 2  . Years of Education: N/A   Occupational History  .  property mgmt    Social History Main Topics  . Smoking status: Former Smoker    Types: Cigarettes    Quit date: 05/20/1991  . Smokeless tobacco: Not on file   Comment: used to smoke a pack in 1-2 weeks/ 1992  . Alcohol Use: Yes     rare  . Drug Use: No  . Sexually Active: Yes    Birth Control/ Protection: Surgical   Review of Systems: Gen: see HPI CV: Denies chest pain, angina, palpitations, syncope, orthopnea, PND, peripheral edema, and claudication. Resp: Denies dyspnea at rest, dyspnea with exercise, cough, sputum, wheezing, coughing up blood, and pleurisy. GI: Denies vomiting blood, jaundice, and fecal incontinence.    GU : Denies urinary burning, blood in urine, urinary frequency, urinary hesitancy, nocturnal urination, and urinary incontinence. MS: Denies joint pain, limitation of movement, and swelling, stiffness, low back pain, extremity pain. Denies muscle weakness,  cramps, atrophy.  Derm: Denies rash, itching, dry skin, hives, moles, warts, or unhealing ulcers.  Psych: Denies depression, anxiety, memory loss, suicidal ideation, hallucinations, paranoia, and confusion. Heme: Denies bruising, bleeding, and enlarged lymph nodes. Neuro:  Denies any headaches, dizziness, paresthesias. Endo:  Denies any problems with DM, thyroid, adrenal function.  Physical Exam: BP 119/72  Pulse 57  Temp 97.8 F (36.6 C) (Tympanic)  Ht 5\' 4"  (1.626 m)  Wt 231 lb (104.781 kg)  BMI 39.65 kg/m2 No LMP recorded. Patient has had a hysterectomy. General:   Alert,  Well-developed, obese, pleasant and cooperative in NAD Head:  Normocephalic and atraumatic. Eyes:  Sclera clear, no icterus.   Conjunctiva pink. Ears:  Normal auditory acuity. Nose:  No deformity, discharge, or lesions. Mouth:  No deformity or lesions,oropharynx pink & moist. Neck:  Supple; no masses or thyromegaly. Lungs:  Clear throughout to auscultation.   No wheezes, crackles, or rhonchi. No acute distress. Heart:  Regular rate and rhythm; no murmurs, clicks, rubs,  or gallops. Abdomen:  Normal bowel sounds.  No bruits.  Soft, non-tender and non-distended without masses, hepatosplenomegaly or hernias noted.  No guarding or rebound tenderness.   Rectal:  Deferred. Msk:  Symmetrical without gross deformities. Normal posture. Pulses:  Normal pulses noted. Extremities:  No clubbing or edema. Neurologic:  Alert and  oriented x4;  grossly normal neurologically. Skin:  Intact without significant lesions or rashes. Lymph Nodes:  No significant cervical adenopathy. Psych:  Alert and cooperative. Normal mood and affect.

## 2012-04-19 MED ORDER — SODIUM CHLORIDE 0.45 % IV SOLN
INTRAVENOUS | Status: DC
  Administered 2012-04-20: 13:00:00 via INTRAVENOUS

## 2012-04-20 ENCOUNTER — Encounter (HOSPITAL_COMMUNITY): Payer: Self-pay

## 2012-04-20 ENCOUNTER — Ambulatory Visit (HOSPITAL_COMMUNITY)
Admission: RE | Admit: 2012-04-20 | Discharge: 2012-04-20 | Disposition: A | Discharge status: Home / Self Care | Payer: Self-pay | Source: Ambulatory Visit | Attending: Gastroenterology | Admitting: Gastroenterology

## 2012-04-20 ENCOUNTER — Encounter (HOSPITAL_COMMUNITY)
Admission: RE | Disposition: A | Discharge status: Home / Self Care | Payer: Self-pay | Source: Ambulatory Visit | Attending: Gastroenterology

## 2012-04-20 DIAGNOSIS — R1310 Dysphagia, unspecified: Secondary | ICD-10-CM | POA: Insufficient documentation

## 2012-04-20 DIAGNOSIS — K2940 Chronic atrophic gastritis without bleeding: Secondary | ICD-10-CM | POA: Insufficient documentation

## 2012-04-20 DIAGNOSIS — K3189 Other diseases of stomach and duodenum: Secondary | ICD-10-CM

## 2012-04-20 DIAGNOSIS — R1013 Epigastric pain: Secondary | ICD-10-CM

## 2012-04-20 DIAGNOSIS — K2990 Gastroduodenitis, unspecified, without bleeding: Secondary | ICD-10-CM

## 2012-04-20 DIAGNOSIS — K2970 Gastritis, unspecified, without bleeding: Secondary | ICD-10-CM

## 2012-04-20 SURGERY — ESOPHAGOGASTRODUODENOSCOPY (EGD) WITH ESOPHAGEAL DILATION
Anesthesia: Moderate Sedation

## 2012-04-20 MED ORDER — MIDAZOLAM HCL 5 MG/5ML IJ SOLN
INTRAMUSCULAR | Status: AC
  Filled 2012-04-20: qty 15

## 2012-04-20 MED ORDER — PROMETHAZINE HCL 25 MG/ML IJ SOLN
INTRAMUSCULAR | Status: AC
  Filled 2012-04-20: qty 1

## 2012-04-20 MED ORDER — PROMETHAZINE HCL 25 MG/ML IJ SOLN
INTRAMUSCULAR | Status: DC | PRN
  Administered 2012-04-20: 12.5 mg via INTRAVENOUS

## 2012-04-20 MED ORDER — MEPERIDINE HCL 100 MG/ML IJ SOLN
INTRAMUSCULAR | Status: AC
  Filled 2012-04-20: qty 2

## 2012-04-20 MED ORDER — BUTAMBEN-TETRACAINE-BENZOCAINE 2-2-14 % EX AERO
INHALATION_SPRAY | CUTANEOUS | Status: DC | PRN
  Administered 2012-04-20: 2 via TOPICAL

## 2012-04-20 MED ORDER — SODIUM CHLORIDE 0.9 % IJ SOLN
INTRAMUSCULAR | Status: AC
  Filled 2012-04-20: qty 10

## 2012-04-20 MED ORDER — MEPERIDINE HCL 100 MG/ML IJ SOLN
INTRAMUSCULAR | Status: DC | PRN
  Administered 2012-04-20 (×2): 50 mg via INTRAVENOUS
  Administered 2012-04-20: 25 mg via INTRAVENOUS

## 2012-04-20 MED ORDER — MIDAZOLAM HCL 5 MG/5ML IJ SOLN
INTRAMUSCULAR | Status: DC | PRN
  Administered 2012-04-20 (×3): 2 mg via INTRAVENOUS

## 2012-04-20 MED ORDER — MINERAL OIL PO OIL
TOPICAL_OIL | ORAL | Status: AC
  Filled 2012-04-20: qty 30

## 2012-04-20 NOTE — Op Note (Signed)
[facility] [address] [place], [address]   ENDOSCOPY PROCEDURE REPORT  PATIENT: Candice, Stanley  MR#: [id number] BIRTHDATE: 1966-04-23 , 46  yrs. old GENDER: Female  ENDOSCOPIST: Jonette Eva, MD REFFERED HQ:IONG Hasanaj, M.D.  PROCEDURE DATE:  04/20/2012 PROCEDURE:   EGD with dilatation over guidewire and EGD with biopsy   INDICATIONS:1.  dyspepsia.   2.  dysphagia. MEDICATIONS: Demerol 125 mg IV, Versed 6 mg IV, and Promethazine (Phenergan) 12.5mg  IV TOPICAL ANESTHETIC: Cetacaine Spray  DESCRIPTION OF PROCEDURE:   After the risks benefits and alternatives of the procedure were thoroughly explained, informed consent was obtained.  The EG-[id number] ([id number])  endoscope was introduced through the mouth and advanced to the second portion of the duodenum.  The instrument was slowly withdrawn as the mucosa was carefully examined.  Prior to withdrawal of the scope, the guidwire was placed.  The esophagus was dilated successfully.  The patient was recovered in endoscopy and discharged home in satisfactory condition.      ESOPHAGUS: The mucosa of the esophagus appeared normal.  Multiple biopsies were performed 20 CM AND 35 CM FROM TEH TEETH. GE JUNCTION 40 CM FROM THE TEETH..  STOMACH: Non-erosive gastritis (inflammation) was found.  Multiple biopsies were performed using cold forceps.  DUODENUM: The duodenal mucosa showed no abnormalities.   Dilation was then performed at the gastroesphageal junction  Dilator: Savary over guidewire Size(s): 16 mm Resistance: minimal  COMPLICATIONS: There were no complications.   ENDOSCOPIC IMPRESSION: 1.   The mucosa of the esophagus appeared normal; multiple biopsies 2.   Non-erosive gastritis (inflammation) was found; multiple biopsies 3.   The duodenal mucosa showed no abnormalities  RECOMMENDATIONS: CONTINUE DEXILANT  LOW FAT DIET  LOSE WEIGHT  OPV IN 3  MOS      _______________________________ eSignedJonette Eva, MD 04/20/2012 2:49 PM      PATIENT NAME:  Candice, Stanley MR#: [id number]

## 2012-04-20 NOTE — H&P (Signed)
  Primary Care Physician:  Toma Deiters, MD Primary Gastroenterologist:  Dr. Darrick Penna  Pre-Procedure History & Physical: HPI:  Candice Stanley is a 46 y.o. female here for DYSPHAGIA.   Past Medical History  Diagnosis Date  . Acid reflux   . Hypercholesteremia   . Thyroid disease   . Complication of anesthesia     pt took something at [facility], bp bottomed out    Past Surgical History  Procedure Date  . Abdominal hysterectomy   . Cholecystectomy   . Wrist surgery     Prior to Admission medications   Medication Sig Start Date End Date Taking? Authorizing Provider  ALPRAZolam Prudy Feeler) 0.5 MG tablet Take 0.25 mg by mouth daily as needed. For anxiety   Yes Historical Provider, MD  aspirin EC 325 MG tablet Take 325 mg by mouth daily.   Yes Historical Provider, MD  citalopram (CELEXA) 20 MG tablet Take 20 mg by mouth daily.     Yes Historical Provider, MD  dexlansoprazole (DEXILANT) 60 MG capsule Take 1 capsule (60 mg total) by mouth daily. 04/16/12  Yes Joselyn Arrow, NP  levothyroxine (SYNTHROID, LEVOTHROID) 50 MCG tablet Take 50 mcg by mouth daily.    Yes Historical Provider, MD  lovastatin (MEVACOR) 20 MG tablet Take 40 mg by mouth daily.    Yes Historical Provider, MD  Multiple Vitamin (MULTIVITAMIN WITH MINERALS) TABS Take 1 tablet by mouth daily.   Yes Historical Provider, MD  ranitidine (ZANTAC) 150 MG tablet Take 300 mg by mouth at bedtime.    Yes Historical Provider, MD    Allergies as of 04/16/2012 - Review Complete 04/16/2012  Allergen Reaction Noted  . Codeine Nausea And Vomiting 11/03/2009  . Vicodin (hydrocodone-acetaminophen) Nausea And Vomiting 03/05/2012    Family History  Problem Relation Age of Onset  . Hypertension Mother   . Coronary artery disease Father   . Heart disease Father   . Coronary artery disease Paternal Uncle   . Coronary artery disease Cousin   . Barrett's esophagus Father     History   Social History  . Marital Status: Married    Spouse  Name: N/A    Number of Children: 2  . Years of Education: N/A   Occupational History  . property mgmt    Social History Main Topics  . Smoking status: Former Smoker    Types: Cigarettes    Quit date: 05/20/1991  . Smokeless tobacco: Not on file   Comment: used to smoke a pack in 1-2 weeks/ 1992  . Alcohol Use: Yes     rare  . Drug Use: No  . Sexually Active: Yes    Birth Control/ Protection: Surgical   Other Topics Concern  . Not on file   Social History Narrative  . No narrative on file    Review of Systems: See HPI, otherwise negative ROS   Physical Exam: BP 130/66  Pulse 65  Temp 98.6 F (37 C) (Oral)  Resp 16  SpO2 94% General:   Alert,  pleasant and cooperative in NAD Head:  Normocephalic and atraumatic. Neck:  Supple; Lungs:  Clear throughout to auscultation.    Heart:  Regular rate and rhythm. Abdomen:  Soft, nontender and nondistended. Normal bowel sounds, without guarding, and without rebound.   Neurologic:  Alert and  oriented x4;  grossly normal neurologically.  Impression/Plan:     DYSPHAGIA  PLAN:  EGD/DIL TODAY

## 2012-04-26 ENCOUNTER — Telehealth: Payer: Self-pay | Admitting: Gastroenterology

## 2012-04-26 NOTE — Telephone Encounter (Signed)
Path faxed to PCP, recall made 

## 2012-04-26 NOTE — Telephone Encounter (Signed)
Pt aware of results 

## 2012-04-26 NOTE — Telephone Encounter (Signed)
Please call pt. HER stomach Bx shows gastritis.     CONTINUE DEXILANT. ZANTAC AS NEEDED.  FOLLOW A LOW FAT DIET. .  FOLLOW UP IN 3 MOS E 30 GERD/DYSPHAGIA SLF.

## 2012-04-26 NOTE — Telephone Encounter (Signed)
Evania can you please NIC for 3 month follow up

## 2012-05-19 NOTE — Progress Notes (Signed)
REVIEWED.  EGD/DIL OCT 2013 GASTRITIS

## 2012-06-26 ENCOUNTER — Encounter: Payer: Self-pay | Admitting: *Deleted

## 2012-11-13 ENCOUNTER — Telehealth: Payer: Self-pay

## 2012-11-13 NOTE — Telephone Encounter (Signed)
Pt requested Dexilant samples. Dexilant 60 mg #15 given to take once daily.

## 2012-11-14 NOTE — Telephone Encounter (Signed)
REVIEWED.  

## 2013-02-24 ENCOUNTER — Encounter (HOSPITAL_COMMUNITY): Payer: Self-pay | Admitting: *Deleted

## 2013-02-24 ENCOUNTER — Emergency Department (HOSPITAL_COMMUNITY)
Admission: EM | Admit: 2013-02-24 | Discharge: 2013-02-24 | Disposition: A | Discharge status: Home / Self Care | Payer: Self-pay | Attending: Emergency Medicine | Admitting: Emergency Medicine

## 2013-02-24 ENCOUNTER — Emergency Department (HOSPITAL_COMMUNITY): Payer: Self-pay

## 2013-02-24 DIAGNOSIS — F411 Generalized anxiety disorder: Secondary | ICD-10-CM | POA: Insufficient documentation

## 2013-02-24 DIAGNOSIS — E079 Disorder of thyroid, unspecified: Secondary | ICD-10-CM | POA: Insufficient documentation

## 2013-02-24 DIAGNOSIS — R0602 Shortness of breath: Secondary | ICD-10-CM | POA: Insufficient documentation

## 2013-02-24 DIAGNOSIS — R6884 Jaw pain: Secondary | ICD-10-CM | POA: Insufficient documentation

## 2013-02-24 DIAGNOSIS — M25519 Pain in unspecified shoulder: Secondary | ICD-10-CM | POA: Insufficient documentation

## 2013-02-24 DIAGNOSIS — IMO0001 Reserved for inherently not codable concepts without codable children: Secondary | ICD-10-CM | POA: Insufficient documentation

## 2013-02-24 DIAGNOSIS — Z7982 Long term (current) use of aspirin: Secondary | ICD-10-CM | POA: Insufficient documentation

## 2013-02-24 DIAGNOSIS — Z87891 Personal history of nicotine dependence: Secondary | ICD-10-CM | POA: Insufficient documentation

## 2013-02-24 DIAGNOSIS — M25512 Pain in left shoulder: Secondary | ICD-10-CM

## 2013-02-24 DIAGNOSIS — M542 Cervicalgia: Secondary | ICD-10-CM

## 2013-02-24 DIAGNOSIS — M549 Dorsalgia, unspecified: Secondary | ICD-10-CM | POA: Insufficient documentation

## 2013-02-24 DIAGNOSIS — K219 Gastro-esophageal reflux disease without esophagitis: Secondary | ICD-10-CM | POA: Insufficient documentation

## 2013-02-24 DIAGNOSIS — E7800 Pure hypercholesterolemia, unspecified: Secondary | ICD-10-CM | POA: Insufficient documentation

## 2013-02-24 DIAGNOSIS — Z79899 Other long term (current) drug therapy: Secondary | ICD-10-CM | POA: Insufficient documentation

## 2013-02-24 LAB — CBC
MCH: 32.4 pg (ref 26.0–34.0)
MCHC: 35.7 g/dL (ref 30.0–36.0)
RDW: 12.6 % (ref 11.5–15.5)

## 2013-02-24 LAB — POCT I-STAT TROPONIN I: Troponin i, poc: 0.01 ng/mL (ref 0.00–0.08)

## 2013-02-24 LAB — BASIC METABOLIC PANEL
Calcium: 10.2 mg/dL (ref 8.4–10.5)
GFR calc Af Amer: >90 mL/min (ref >90)
GFR calc non Af Amer: >90 mL/min (ref >90)
Glucose, Bld: 139 mg/dL — ABNORMAL HIGH (ref 70–99)
Potassium: 4.1 mEq/L (ref 3.5–5.1)
Sodium: 138 mEq/L (ref 135–145)

## 2013-02-24 NOTE — ED Provider Notes (Signed)
CSN: [id number]     Arrival date & time 02/24/13  1013 History     First MD Initiated Contact with Patient 02/24/13 1227     Chief Complaint  Patient presents with  . Shoulder Pain  . Jaw Pain  . Neck Pain   (Consider location/radiation/quality/duration/timing/severity/associated sxs/prior Treatment) HPI Pt presents with worsening bl jaw, L shoulder, upper back and L upper and lower extremity pain for a week worse today. Pt states she has been under increasing stress lately and this past week was her late father's birthday who died last year of a MI. She has had neg coronary cath in late 90's and normal stress test last year. She states she continues to have some jaw pain currently. She denies chest pain, lower ext swelling or tenderness. She does experience mild dyspnea with exertion. Pt has not been taking xanax prescribed in the past for anxiety. She is tearful during history taking.  Past Medical History  Diagnosis Date  . Acid reflux   . Hypercholesteremia   . Thyroid disease   . Complication of anesthesia     pt took something at [facility], bp bottomed out   Past Surgical History  Procedure Laterality Date  . Abdominal hysterectomy    . Cholecystectomy    . Wrist surgery     Family History  Problem Relation Age of Onset  . Hypertension Mother   . Coronary artery disease Father   . Heart disease Father   . Coronary artery disease Paternal Uncle   . Coronary artery disease Cousin   . Barrett's esophagus Father    History  Substance Use Topics  . Smoking status: Former Smoker    Types: Cigarettes    Quit date: 05/20/1991  . Smokeless tobacco: Not on file     Comment: used to smoke a pack in 1-2 weeks/ 1992  . Alcohol Use: Yes     Comment: rare   OB History   Grav Para Term Preterm Abortions TAB SAB Ect Mult Living                 Review of Systems  Constitutional: Negative for fever and chills.  HENT: Positive for neck pain. Negative for trouble swallowing and  neck stiffness.   Respiratory: Positive for shortness of breath. Negative for cough and wheezing.   Cardiovascular: Negative for chest pain, palpitations and leg swelling.  Gastrointestinal: Negative for nausea, vomiting, abdominal pain, diarrhea and constipation.  Genitourinary: Negative for dysuria.  Musculoskeletal: Positive for myalgias and back pain.  Skin: Negative for rash and wound.  Neurological: Negative for dizziness, weakness, light-headedness, numbness and headaches.  Psychiatric/Behavioral: The patient is nervous/anxious.   All other systems reviewed and are negative.    Allergies  Codeine and Vicodin  Home Medications   Current Outpatient Rx  Name  Route  Sig  Dispense  Refill  . ALPRAZolam (XANAX) 0.5 MG tablet   Oral   Take 0.25 mg by mouth daily as needed. For anxiety         . aspirin EC 325 MG tablet   Oral   Take 325 mg by mouth daily.         . citalopram (CELEXA) 20 MG tablet   Oral   Take 20 mg by mouth daily.           Marland Kitchen dexlansoprazole (DEXILANT) 60 MG capsule   Oral   Take 1 capsule (60 mg total) by mouth daily.   31 capsule  2   . levothyroxine (SYNTHROID, LEVOTHROID) 50 MCG tablet   Oral   Take 50 mcg by mouth daily.          Marland Kitchen loratadine (CLARITIN) 10 MG tablet   Oral   Take 10 mg by mouth daily.         Marland Kitchen lovastatin (MEVACOR) 20 MG tablet   Oral   Take 40 mg by mouth daily.          . Multiple Vitamin (MULTIVITAMIN WITH MINERALS) TABS   Oral   Take 1 tablet by mouth daily.         . ranitidine (ZANTAC) 150 MG tablet   Oral   Take 300 mg by mouth at bedtime.           BP 118/62  Pulse 62  Temp(Src) 97.8 F (36.6 C) (Oral)  Resp 22  SpO2 97% Physical Exam  Nursing note and vitals reviewed. Constitutional: She is oriented to person, place, and time. She appears well-developed and well-nourished. No distress.  crying  HENT:  Head: Normocephalic and atraumatic.  Mouth/Throat: Oropharynx is clear and  moist. No oropharyngeal exudate.  Eyes: EOM are normal. Pupils are equal, round, and reactive to light.  Neck: Normal range of motion. Neck supple.  Cardiovascular: Normal rate and regular rhythm.  Exam reveals no gallop and no friction rub.   No murmur heard. Pulmonary/Chest: Effort normal and breath sounds normal. No stridor. No respiratory distress. She has no wheezes. She has no rales.  Abdominal: Soft. Bowel sounds are normal. She exhibits no distension and no mass. There is no tenderness. There is no rebound and no guarding.  Musculoskeletal: Normal range of motion. She exhibits no edema and no tenderness.  No calf swelling or tenderness  Lymphadenopathy:    She has no cervical adenopathy.  Neurological: She is alert and oriented to person, place, and time.  5/5 motor in all ext, sensation intact  Skin: Skin is warm and dry. No rash noted. No erythema.  Psychiatric:  Anxious/tearful    ED Course   Procedures (including critical care time)  Labs Reviewed  BASIC METABOLIC PANEL - Abnormal; Notable for the following:    Glucose, Bld 139 (*)    All other components within normal limits  CBC  PRO B NATRIURETIC PEPTIDE  TROPONIN I  POCT I-STAT TROPONIN I   Dg Chest 2 View  02/24/2013   *RADIOLOGY REPORT*  Clinical Data: Shoulder and jaw pain.  CHEST - 2 VIEW  Comparison:  01/17/2012  Findings:  The heart size and mediastinal contours are within normal limits.  Both lungs are clear.  The visualized skeletal structures are unremarkable.  IMPRESSION: No active cardiopulmonary disease.   Original Report Authenticated By: Richarda Overlie, M.D.   1. Neck pain   2. Shoulder pain, left     Date: 02/24/2013  Rate: 73  Rhythm: normal sinus rhythm  QRS Axis: normal  Intervals: normal  ST/T Wave abnormalities: normal  Conduction Disutrbances:none  Narrative Interpretation:   Old EKG Reviewed: none available   MDM  Very atypical symptoms for CAD. Likely stress/anxiety related. Discussed  with Dr Anola Gurney. Reviewed pt prev testing. Suggest repeat trop and if neg d/c home.    Repeat trop neg. Pt is now calm and symptom-free. Urged to f/u with cardiologist or return for worse or changing symptoms.   Loren Racer, MD 02/24/13 1459

## 2013-02-24 NOTE — ED Notes (Signed)
Pt reports having some left jaw pain, neck pain, shoulder pain. Having nausea and sob with exertion. ekg done at triage.

## 2013-02-24 NOTE — ED Notes (Signed)
Pt crying, states has been having "a lot of stress at work" -- states only takes alprazolam occasionally when under great stress "like funerals"

## 2013-02-24 NOTE — ED Notes (Signed)
Left shoulder started hurting yesterday, radiating across back to right shoulder, left sided chest pain. Woke up with pain last night, with nausea. Also, c/o neck and jaw pain with walking, currently having left jaw pain, left side of neck pain.

## 2013-03-28 ENCOUNTER — Encounter (HOSPITAL_COMMUNITY): Payer: Self-pay | Admitting: *Deleted

## 2013-03-28 ENCOUNTER — Emergency Department (HOSPITAL_COMMUNITY): Payer: PRIVATE HEALTH INSURANCE

## 2013-03-28 ENCOUNTER — Emergency Department (HOSPITAL_COMMUNITY)
Admission: EM | Admit: 2013-03-28 | Discharge: 2013-03-28 | Disposition: A | Discharge status: Home / Self Care | Payer: PRIVATE HEALTH INSURANCE | Attending: Emergency Medicine | Admitting: Emergency Medicine

## 2013-03-28 DIAGNOSIS — E7800 Pure hypercholesterolemia, unspecified: Secondary | ICD-10-CM | POA: Insufficient documentation

## 2013-03-28 DIAGNOSIS — Z79899 Other long term (current) drug therapy: Secondary | ICD-10-CM | POA: Insufficient documentation

## 2013-03-28 DIAGNOSIS — M549 Dorsalgia, unspecified: Secondary | ICD-10-CM | POA: Insufficient documentation

## 2013-03-28 DIAGNOSIS — K219 Gastro-esophageal reflux disease without esophagitis: Secondary | ICD-10-CM | POA: Insufficient documentation

## 2013-03-28 DIAGNOSIS — Z87891 Personal history of nicotine dependence: Secondary | ICD-10-CM | POA: Insufficient documentation

## 2013-03-28 DIAGNOSIS — Z7982 Long term (current) use of aspirin: Secondary | ICD-10-CM | POA: Insufficient documentation

## 2013-03-28 DIAGNOSIS — E079 Disorder of thyroid, unspecified: Secondary | ICD-10-CM | POA: Insufficient documentation

## 2013-03-28 LAB — CBC
HCT: 37.2 % (ref 36.0–46.0)
MCH: 32 pg (ref 26.0–34.0)
MCV: 93 fL (ref 78.0–100.0)
Platelets: 225 10*3/uL (ref 150–400)
RBC: 4 MIL/uL (ref 3.87–5.11)
RDW: 12.5 % (ref 11.5–15.5)
WBC: 7.6 10*3/uL (ref 4.0–10.5)

## 2013-03-28 LAB — BASIC METABOLIC PANEL
BUN: 9 mg/dL (ref 6–23)
CO2: 27 mEq/L (ref 19–32)
Calcium: 10.4 mg/dL (ref 8.4–10.5)
Chloride: 103 mEq/L (ref 96–112)
Creatinine, Ser: 0.87 mg/dL (ref 0.50–1.10)

## 2013-03-28 MED ORDER — GI COCKTAIL ~~LOC~~
30.0000 mL | Freq: Once | ORAL | Status: AC
  Administered 2013-03-28: 30 mL via ORAL
  Filled 2013-03-28: qty 30

## 2013-03-28 NOTE — ED Notes (Signed)
Pt states intermittent episodes of pain between shoulder blades, dizziness, and nausea. Has been seen for the same recently.

## 2013-03-28 NOTE — ED Provider Notes (Signed)
CSN: [id number]     Arrival date & time 03/28/13  1822 History   First MD Initiated Contact with Patient 03/28/13 1830     Chief Complaint  Patient presents with  . Dizziness   (Consider location/radiation/quality/duration/timing/severity/associated sxs/prior Treatment) HPI Comments: Pt is a 47 y/o female with hx of GERD and esophageal strictures who has had a neg stress in last year according to her report - she had neg cath 15 years ago - she doesn't smoke, has no htn and no DM.  She does have treated hypercholesterolemia.  She is generally symptom free but of late has had a couple of episodes where she develops pain between her shoulder blades - recently she was evaluated at [facility] and had 2 trop neg and normal ECG and no sx until last night - had these sx again - is a dull throbbing pain that gets severe at times.  It has come back in last 2 hours and is persistent - is not positional, exertional or related to eating.  She has no SOB, no fevers, no sweling, no coughing.  She has had tums without relief.  She states she had recent EGD for dilation by Dr. Darrick Penna.    The history is provided by the patient and medical records.    Past Medical History  Diagnosis Date  . Acid reflux   . Hypercholesteremia   . Thyroid disease   . Complication of anesthesia     pt took something at [facility], bp bottomed out   Past Surgical History  Procedure Laterality Date  . Abdominal hysterectomy    . Cholecystectomy    . Wrist surgery     Family History  Problem Relation Age of Onset  . Hypertension Mother   . Coronary artery disease Father   . Heart disease Father   . Coronary artery disease Paternal Uncle   . Coronary artery disease Cousin   . Barrett's esophagus Father    History  Substance Use Topics  . Smoking status: Former Smoker    Types: Cigarettes    Quit date: 05/20/1991  . Smokeless tobacco: Not on file     Comment: used to smoke a pack in 1-2 weeks/ 1992  . Alcohol Use: Yes    Comment: rare   OB History   Grav Para Term Preterm Abortions TAB SAB Ect Mult Living                 Review of Systems  All other systems reviewed and are negative.    Allergies  Codeine; Dilaudid; and Vicodin  Home Medications   Current Outpatient Rx  Name  Route  Sig  Dispense  Refill  . aspirin EC 325 MG tablet   Oral   Take 325 mg by mouth daily.         . citalopram (CELEXA) 20 MG tablet   Oral   Take 20 mg by mouth daily.           Marland Kitchen dexlansoprazole (DEXILANT) 60 MG capsule   Oral   Take 1 capsule (60 mg total) by mouth daily.   31 capsule   2   . levothyroxine (SYNTHROID, LEVOTHROID) 50 MCG tablet   Oral   Take 50 mcg by mouth daily.          Marland Kitchen loratadine (CLARITIN) 10 MG tablet   Oral   Take 10 mg by mouth daily.         Marland Kitchen lovastatin (MEVACOR) 20 MG tablet  Oral   Take 40 mg by mouth daily.          . Multiple Vitamin (MULTIVITAMIN WITH MINERALS) TABS   Oral   Take 1 tablet by mouth daily.         . ranitidine (ZANTAC) 150 MG tablet   Oral   Take 300 mg by mouth at bedtime.          . ALPRAZolam (XANAX) 0.5 MG tablet   Oral   Take 0.25 mg by mouth daily as needed. For anxiety          BP 126/59  Pulse 78  Temp(Src) 97.3 F (36.3 C) (Oral)  Resp 16  Ht 5\' 4"  (1.626 m)  Wt 220 lb (99.791 kg)  BMI 37.74 kg/m2  SpO2 98% Physical Exam  Nursing note and vitals reviewed. Constitutional: She appears well-developed and well-nourished. No distress.  HENT:  Head: Normocephalic and atraumatic.  Mouth/Throat: Oropharynx is clear and moist. No oropharyngeal exudate.  Eyes: Conjunctivae and EOM are normal. Pupils are equal, round, and reactive to light. Right eye exhibits no discharge. Left eye exhibits no discharge. No scleral icterus.  Neck: Normal range of motion. Neck supple. No JVD present. No thyromegaly present.  Cardiovascular: Normal rate, regular rhythm, normal heart sounds and intact distal pulses.  Exam reveals no  gallop and no friction rub.   No murmur heard. Pulmonary/Chest: Effort normal and breath sounds normal. No respiratory distress. She has no wheezes. She has no rales.  Abdominal: Soft. Bowel sounds are normal. She exhibits no distension and no mass. There is no tenderness.  Musculoskeletal: Normal range of motion. She exhibits no edema and no tenderness.  No ttp over the back  Lymphadenopathy:    She has no cervical adenopathy.  Neurological: She is alert. Coordination normal.  Skin: Skin is warm and dry. No rash noted. No erythema.  Psychiatric: She has a normal mood and affect. Her behavior is normal.    ED Course  Procedures (including critical care time) Labs Review Labs Reviewed  D-DIMER, QUANTITATIVE - Abnormal; Notable for the following:    D-Dimer, Quant 0.57 (*)    All other components within normal limits  BASIC METABOLIC PANEL - Abnormal; Notable for the following:    Glucose, Bld 146 (*)    GFR calc non Af Amer 77 (*)    GFR calc Af Amer 89 (*)    All other components within normal limits  CBC  TROPONIN I   Imaging Review Dg Chest 2 View  03/28/2013   CLINICAL DATA:  Chest pain with radiation to the upper back.  EXAM: CHEST  2 VIEW  COMPARISON:  02/24/2013  FINDINGS: The heart size and mediastinal contours are within normal limits. Both lungs are clear. The visualized skeletal structures are unremarkable.  IMPRESSION: No active cardiopulmonary disease.   Electronically Signed   By: Richarda Overlie M.D.   On: 03/28/2013 19:31    MDM   1. Back pain    No abnormal findings on her exam, VS show  Filed Vitals:   03/28/13 1840 03/28/13 1842  BP: 126/59   Pulse: 78   Temp:  97.3 F (36.3 C)  TempSrc: Oral Oral  Resp: 16   Height: 5\' 4"  (1.626 m)   Weight: 220 lb (99.791 kg)   SpO2: 98%     She has low risk for PE, ACS and dissection - check dimer, labs, GI cocktail.  Repeat ECG.  ED ECG REPORT  I personally  interpreted this EKG   Date: 03/28/2013   Rate: 71   Rhythm: normal sinus rhythm  QRS Axis: normal  Intervals: normal  ST/T Wave abnormalities: normal  Conduction Disutrbances:none  Narrative Interpretation:   Old EKG Reviewed: unchanged c/w 02/24/13  The pt now states that she is extremely anxious in personality and this happens when she gets anxious.  She has essentially normal tests and appears stable for f/u - I have encouraged her to use her xanax for severe or worsening anxiety.   Vida Roller, MD 03/28/13 2051

## 2013-08-26 ENCOUNTER — Emergency Department (HOSPITAL_COMMUNITY)
Admission: EM | Admit: 2013-08-26 | Discharge: 2013-08-26 | Disposition: A | Discharge status: Home / Self Care | Payer: PRIVATE HEALTH INSURANCE | Attending: Emergency Medicine | Admitting: Emergency Medicine

## 2013-08-26 ENCOUNTER — Encounter (HOSPITAL_COMMUNITY): Payer: Self-pay | Admitting: Emergency Medicine

## 2013-08-26 DIAGNOSIS — R109 Unspecified abdominal pain: Secondary | ICD-10-CM

## 2013-08-26 DIAGNOSIS — Z90710 Acquired absence of both cervix and uterus: Secondary | ICD-10-CM | POA: Insufficient documentation

## 2013-08-26 DIAGNOSIS — Z7982 Long term (current) use of aspirin: Secondary | ICD-10-CM | POA: Insufficient documentation

## 2013-08-26 DIAGNOSIS — M791 Myalgia, unspecified site: Secondary | ICD-10-CM

## 2013-08-26 DIAGNOSIS — R1032 Left lower quadrant pain: Secondary | ICD-10-CM | POA: Insufficient documentation

## 2013-08-26 DIAGNOSIS — Z9089 Acquired absence of other organs: Secondary | ICD-10-CM | POA: Insufficient documentation

## 2013-08-26 DIAGNOSIS — Z791 Long term (current) use of non-steroidal anti-inflammatories (NSAID): Secondary | ICD-10-CM | POA: Insufficient documentation

## 2013-08-26 DIAGNOSIS — Z79899 Other long term (current) drug therapy: Secondary | ICD-10-CM | POA: Insufficient documentation

## 2013-08-26 DIAGNOSIS — N390 Urinary tract infection, site not specified: Secondary | ICD-10-CM | POA: Insufficient documentation

## 2013-08-26 DIAGNOSIS — R1033 Periumbilical pain: Secondary | ICD-10-CM | POA: Insufficient documentation

## 2013-08-26 DIAGNOSIS — Z87891 Personal history of nicotine dependence: Secondary | ICD-10-CM | POA: Insufficient documentation

## 2013-08-26 DIAGNOSIS — IMO0001 Reserved for inherently not codable concepts without codable children: Secondary | ICD-10-CM | POA: Insufficient documentation

## 2013-08-26 DIAGNOSIS — K219 Gastro-esophageal reflux disease without esophagitis: Secondary | ICD-10-CM | POA: Insufficient documentation

## 2013-08-26 DIAGNOSIS — E079 Disorder of thyroid, unspecified: Secondary | ICD-10-CM | POA: Insufficient documentation

## 2013-08-26 DIAGNOSIS — E860 Dehydration: Secondary | ICD-10-CM | POA: Insufficient documentation

## 2013-08-26 DIAGNOSIS — E7800 Pure hypercholesterolemia, unspecified: Secondary | ICD-10-CM | POA: Insufficient documentation

## 2013-08-26 LAB — CBC WITH DIFFERENTIAL/PLATELET
BASOS ABS: 0 10*3/uL (ref 0.0–0.1)
BASOS PCT: 0 % (ref 0–1)
EOS ABS: 0 10*3/uL (ref 0.0–0.7)
Eosinophils Relative: 0 % (ref 0–5)
HCT: 41.1 % (ref 36.0–46.0)
Hemoglobin: 13.9 g/dL (ref 12.0–15.0)
Lymphocytes Relative: 11 % — ABNORMAL LOW (ref 12–46)
Lymphs Abs: 1 10*3/uL (ref 0.7–4.0)
MCH: 32.1 pg (ref 26.0–34.0)
MCHC: 33.8 g/dL (ref 30.0–36.0)
MCV: 94.9 fL (ref 78.0–100.0)
MONOS PCT: 7 % (ref 3–12)
Monocytes Absolute: 0.6 10*3/uL (ref 0.1–1.0)
NEUTROS ABS: 7.3 10*3/uL (ref 1.7–7.7)
NEUTROS PCT: 82 % — AB (ref 43–77)
Platelets: 189 10*3/uL (ref 150–400)
RBC: 4.33 MIL/uL (ref 3.87–5.11)
RDW: 12.9 % (ref 11.5–15.5)
WBC: 8.9 10*3/uL (ref 4.0–10.5)

## 2013-08-26 LAB — URINALYSIS, ROUTINE W REFLEX MICROSCOPIC
Bilirubin Urine: NEGATIVE
GLUCOSE, UA: NEGATIVE mg/dL
Ketones, ur: NEGATIVE mg/dL
LEUKOCYTES UA: NEGATIVE
NITRITE: NEGATIVE
PH: 5.5 (ref 5.0–8.0)
Protein, ur: 30 mg/dL — AB
Urobilinogen, UA: 0.2 mg/dL (ref 0.0–1.0)

## 2013-08-26 LAB — COMPREHENSIVE METABOLIC PANEL
ALBUMIN: 3.7 g/dL (ref 3.5–5.2)
ALK PHOS: 106 U/L (ref 39–117)
ALT: 58 U/L — ABNORMAL HIGH (ref 0–35)
AST: 42 U/L — AB (ref 0–37)
BILIRUBIN TOTAL: 0.4 mg/dL (ref 0.3–1.2)
BUN: 6 mg/dL (ref 6–23)
CHLORIDE: 103 meq/L (ref 96–112)
CO2: 24 mEq/L (ref 19–32)
CREATININE: 0.75 mg/dL (ref 0.50–1.10)
Calcium: 9.9 mg/dL (ref 8.4–10.5)
GFR calc Af Amer: >90 mL/min (ref >90)
GFR calc non Af Amer: >90 mL/min (ref >90)
Glucose, Bld: 127 mg/dL — ABNORMAL HIGH (ref 70–99)
POTASSIUM: 4.1 meq/L (ref 3.7–5.3)
Sodium: 141 mEq/L (ref 137–147)
TOTAL PROTEIN: 7.6 g/dL (ref 6.0–8.3)

## 2013-08-26 LAB — URINE MICROSCOPIC-ADD ON

## 2013-08-26 LAB — LIPASE, BLOOD: LIPASE: 27 U/L (ref 11–59)

## 2013-08-26 MED ORDER — METOCLOPRAMIDE HCL 5 MG/ML IJ SOLN
10.0000 mg | Freq: Once | INTRAMUSCULAR | Status: AC
  Administered 2013-08-26: 10 mg via INTRAVENOUS

## 2013-08-26 MED ORDER — NAPROXEN 500 MG PO TABS: 500.0000 mg | ORAL_TABLET | Freq: Two times a day (BID) | ORAL | Status: DC

## 2013-08-26 MED ORDER — METOCLOPRAMIDE HCL 5 MG/ML IJ SOLN
INTRAMUSCULAR | Status: AC
  Filled 2013-08-26: qty 2

## 2013-08-26 MED ORDER — SULFAMETHOXAZOLE-TRIMETHOPRIM 800-160 MG PO TABS: 1.0000 | ORAL_TABLET | Freq: Two times a day (BID) | ORAL | Status: DC

## 2013-08-26 MED ORDER — KETOROLAC TROMETHAMINE 30 MG/ML IJ SOLN
30.0000 mg | Freq: Once | INTRAMUSCULAR | Status: AC
  Administered 2013-08-26: 30 mg via INTRAVENOUS
  Filled 2013-08-26: qty 1

## 2013-08-26 MED ORDER — SODIUM CHLORIDE 0.9 % IV BOLUS (SEPSIS)
1000.0000 mL | Freq: Once | INTRAVENOUS | Status: AC
  Administered 2013-08-26: 1000 mL via INTRAVENOUS

## 2013-08-26 NOTE — Discharge Instructions (Signed)
Your testing has not shown any specific abnormalities other than some signs of urine infection - it may be that you are starting to develop a urine infection or the flu or other viral infection - start the antibiotic called bactrim twice daily for 7 days, you may also use high dose naprosyn (see prescription) for pain.    Please call your doctor for a followup appointment within 24-48 hours. When you talk to your doctor please let them know that you were seen in the emergency department and have them acquire all of your records so that they can discuss the findings with you and formulate a treatment plan to fully care for your new and ongoing problems.

## 2013-08-26 NOTE — ED Notes (Signed)
Pt. Reports generalized body aches beginning on Friday. Pt. Reports intermittent hot flashes and chills, denies fever. Pt. Reports generalized abdominal pain beginning today. Pt. Denies nausea and vomiting but reports one episode of diarrhea. Pt. Reports headache that woke her from sleep. Pt. Reports pain in left side of head. Pt. Denies dizziness.

## 2013-08-26 NOTE — ED Provider Notes (Signed)
CSN: [id number]     Arrival date & time 08/26/13  0425 History   First MD Initiated Contact with Patient 08/26/13 [phone]     Chief Complaint  Patient presents with  . Generalized Body Aches  . Abdominal Pain     (Consider location/radiation/quality/duration/timing/severity/associated sxs/prior Treatment) HPI Comments: 48 year old female with a history of hypothyroidism acid reflux and hypercholesterolemia. She presents to the hospital with approximately 3 days of body aches which he describes as abdominal and lower back however she does aching in all of her muscles including her legs arms and neck. This has been persistent, associated with chills but no fever vomiting or nausea. She did have one episode of diarrhea prior to arrival and has developed a headache (states this is similar to her frequent headaches). She has had no sick contacts, has not started any new medications, has not had any syncopal episodes, coughing or chest pain.  Patient is a 48 y.o. female presenting with abdominal pain. The history is provided by the patient.  Abdominal Pain   Past Medical History  Diagnosis Date  . Acid reflux   . Hypercholesteremia   . Complication of anesthesia     pt took something at [facility], bp bottomed out  . Thyroid disease    Past Surgical History  Procedure Laterality Date  . Abdominal hysterectomy    . Cholecystectomy    . Wrist surgery     Family History  Problem Relation Age of Onset  . Hypertension Mother   . Coronary artery disease Father   . Heart disease Father   . Coronary artery disease Paternal Uncle   . Coronary artery disease Cousin   . Barrett's esophagus Father    History  Substance Use Topics  . Smoking status: Former Smoker    Types: Cigarettes    Quit date: 05/20/1991  . Smokeless tobacco: Not on file     Comment: used to smoke a pack in 1-2 weeks/ 1992  . Alcohol Use: Yes     Comment: rare   OB History   Grav Para Term Preterm Abortions TAB SAB Ect  Mult Living                 Review of Systems  Gastrointestinal: Positive for abdominal pain.  All other systems reviewed and are negative.      Allergies  Codeine; Dilaudid; and Vicodin  Home Medications   Current Outpatient Rx  Name  Route  Sig  Dispense  Refill  . ALPRAZolam (XANAX) 0.5 MG tablet   Oral   Take 0.25 mg by mouth daily as needed. For anxiety         . aspirin EC 325 MG tablet   Oral   Take 325 mg by mouth daily.         . citalopram (CELEXA) 20 MG tablet   Oral   Take 20 mg by mouth daily.           Marland Kitchen. dexlansoprazole (DEXILANT) 60 MG capsule   Oral   Take 1 capsule (60 mg total) by mouth daily.   31 capsule   2   . levothyroxine (SYNTHROID, LEVOTHROID) 50 MCG tablet   Oral   Take 50 mcg by mouth daily.          Marland Kitchen. loratadine (CLARITIN) 10 MG tablet   Oral   Take 10 mg by mouth daily.         Marland Kitchen. lovastatin (MEVACOR) 20 MG tablet   Oral  Take 40 mg by mouth daily.          . Multiple Vitamin (MULTIVITAMIN WITH MINERALS) TABS   Oral   Take 1 tablet by mouth daily.         . naproxen (NAPROSYN) 500 MG tablet   Oral   Take 1 tablet (500 mg total) by mouth 2 (two) times daily with a meal.   30 tablet   0   . ranitidine (ZANTAC) 150 MG tablet   Oral   Take 300 mg by mouth at bedtime.          . sulfamethoxazole-trimethoprim (SEPTRA DS) 800-160 MG per tablet   Oral   Take 1 tablet by mouth every 12 (twelve) hours.   20 tablet   0    BP 112/62  Pulse 90  Temp(Src) 97.5 F (36.4 C) (Oral)  Resp 22  Ht 5\' 4"  (1.626 m)  Wt 220 lb (99.791 kg)  BMI 37.74 kg/m2  SpO2 96% Physical Exam  Nursing note and vitals reviewed. Constitutional: She appears well-developed and well-nourished. No distress.  HENT:  Head: Normocephalic and atraumatic.  Mouth/Throat: Oropharynx is clear and moist. No oropharyngeal exudate.  Eyes: Conjunctivae and EOM are normal. Pupils are equal, round, and reactive to light. Right eye exhibits no  discharge. Left eye exhibits no discharge. No scleral icterus.  Neck: Normal range of motion. Neck supple. No JVD present. No thyromegaly present.  Very supple neck, normal range of motion, no lymphadenopathy  Cardiovascular: Normal rate, regular rhythm, normal heart sounds and intact distal pulses.  Exam reveals no gallop and no friction rub.   No murmur heard. Pulmonary/Chest: Effort normal and breath sounds normal. No respiratory distress. She has no wheezes. She has no rales.  Abdominal: Soft. Bowel sounds are normal. She exhibits no distension and no mass. There is tenderness (minimal periumbilical and left lower quadrant tenderness, no guarding, no pain at McBurney's point).  Musculoskeletal: Normal range of motion. She exhibits no edema and no tenderness.  Lymphadenopathy:    She has no cervical adenopathy.  Neurological: She is alert. Coordination normal.  Skin: Skin is warm and dry. No rash noted. No erythema.  Psychiatric: She has a normal mood and affect. Her behavior is normal.    ED Course  Procedures (including critical care time) Labs Review Labs Reviewed  CBC WITH DIFFERENTIAL - Abnormal; Notable for the following:    Neutrophils Relative % 82 (*)    Lymphocytes Relative 11 (*)    All other components within normal limits  COMPREHENSIVE METABOLIC PANEL - Abnormal; Notable for the following:    Glucose, Bld 127 (*)    AST 42 (*)    ALT 58 (*)    All other components within normal limits  URINALYSIS, ROUTINE W REFLEX MICROSCOPIC - Abnormal; Notable for the following:    Specific Gravity, Urine >1.030 (*)    Hgb urine dipstick SMALL (*)    Protein, ur 30 (*)    All other components within normal limits  URINE MICROSCOPIC-ADD ON - Abnormal; Notable for the following:    Squamous Epithelial / LPF MANY (*)    Bacteria, UA MANY (*)    All other components within normal limits  URINE CULTURE  LIPASE, BLOOD    MDM   Final diagnoses:  UTI (lower urinary tract  infection)  Abdominal pain  Myalgia  Dehydration    The patient's joints are supple, compartments are soft, abdomen is benign though there is mild tenderness in  the mid and left lower quadrant. She has no CVA tenderness, no fever, no tachycardia and normal vital signs. We'll check labs, urinalysis take his pain medication, reevaluate.  The patient has improved after medications, she has been given IV fluids for her dehydration, vital signs remained stable, neck remains supple, otherwise her mental status is at baseline. Would consider urinary infection, viral syndrome.  Meds given in ED:  Medications  metoCLOPramide (REGLAN) injection 10 mg (not administered)  ketorolac (TORADOL) 30 MG/ML injection 30 mg (30 mg Intravenous Given 08/26/13 0522)  sodium chloride 0.9 % bolus 1,000 mL (1,000 mLs Intravenous New Bag/Given 08/26/13 0522)    New Prescriptions   NAPROXEN (NAPROSYN) 500 MG TABLET    Take 1 tablet (500 mg total) by mouth 2 (two) times daily with a meal.   SULFAMETHOXAZOLE-TRIMETHOPRIM (SEPTRA DS) 800-160 MG PER TABLET    Take 1 tablet by mouth every 12 (twelve) hours.        Vida Roller, MD 08/26/13 [phone]

## 2013-08-27 LAB — URINE CULTURE: Colony Count: 7000

## 2013-09-28 ENCOUNTER — Encounter (HOSPITAL_COMMUNITY): Payer: Self-pay | Admitting: Emergency Medicine

## 2013-09-28 ENCOUNTER — Emergency Department (HOSPITAL_COMMUNITY)
Admission: EM | Admit: 2013-09-28 | Discharge: 2013-09-28 | Disposition: A | Discharge status: Home / Self Care | Payer: No Typology Code available for payment source | Attending: Emergency Medicine | Admitting: Emergency Medicine

## 2013-09-28 ENCOUNTER — Emergency Department (HOSPITAL_COMMUNITY): Payer: No Typology Code available for payment source

## 2013-09-28 DIAGNOSIS — E7800 Pure hypercholesterolemia, unspecified: Secondary | ICD-10-CM | POA: Insufficient documentation

## 2013-09-28 DIAGNOSIS — E079 Disorder of thyroid, unspecified: Secondary | ICD-10-CM | POA: Insufficient documentation

## 2013-09-28 DIAGNOSIS — K219 Gastro-esophageal reflux disease without esophagitis: Secondary | ICD-10-CM | POA: Insufficient documentation

## 2013-09-28 DIAGNOSIS — Z791 Long term (current) use of non-steroidal anti-inflammatories (NSAID): Secondary | ICD-10-CM | POA: Insufficient documentation

## 2013-09-28 DIAGNOSIS — Z79899 Other long term (current) drug therapy: Secondary | ICD-10-CM | POA: Insufficient documentation

## 2013-09-28 DIAGNOSIS — Z87891 Personal history of nicotine dependence: Secondary | ICD-10-CM | POA: Insufficient documentation

## 2013-09-28 DIAGNOSIS — R112 Nausea with vomiting, unspecified: Secondary | ICD-10-CM | POA: Insufficient documentation

## 2013-09-28 DIAGNOSIS — F411 Generalized anxiety disorder: Secondary | ICD-10-CM | POA: Insufficient documentation

## 2013-09-28 DIAGNOSIS — Z7982 Long term (current) use of aspirin: Secondary | ICD-10-CM | POA: Insufficient documentation

## 2013-09-28 DIAGNOSIS — R12 Heartburn: Secondary | ICD-10-CM | POA: Insufficient documentation

## 2013-09-28 DIAGNOSIS — Z9889 Other specified postprocedural states: Secondary | ICD-10-CM | POA: Insufficient documentation

## 2013-09-28 DIAGNOSIS — R079 Chest pain, unspecified: Secondary | ICD-10-CM | POA: Insufficient documentation

## 2013-09-28 LAB — I-STAT CHEM 8, ED
BUN: 8 mg/dL (ref 6–23)
CHLORIDE: 105 meq/L (ref 96–112)
CREATININE: 0.8 mg/dL (ref 0.50–1.10)
Calcium, Ion: 1.29 mmol/L — ABNORMAL HIGH (ref 1.12–1.23)
Glucose, Bld: 128 mg/dL — ABNORMAL HIGH (ref 70–99)
HCT: 40 % (ref 36.0–46.0)
Hemoglobin: 13.6 g/dL (ref 12.0–15.0)
POTASSIUM: 4.3 meq/L (ref 3.7–5.3)
SODIUM: 140 meq/L (ref 137–147)
TCO2: 24 mmol/L (ref 0–100)

## 2013-09-28 LAB — I-STAT TROPONIN, ED
TROPONIN I, POC: 0 ng/mL (ref 0.00–0.08)
TROPONIN I, POC: 0 ng/mL (ref 0.00–0.08)

## 2013-09-28 MED ORDER — ONDANSETRON 4 MG PO TBDP
4.0000 mg | ORAL_TABLET | Freq: Once | ORAL | Status: AC
  Administered 2013-09-28: 4 mg via ORAL

## 2013-09-28 MED ORDER — GI COCKTAIL ~~LOC~~
30.0000 mL | Freq: Once | ORAL | Status: AC
  Administered 2013-09-28: 30 mL via ORAL
  Filled 2013-09-28: qty 30

## 2013-09-28 MED ORDER — ONDANSETRON 4 MG PO TBDP
ORAL_TABLET | ORAL | Status: AC
  Filled 2013-09-28: qty 1

## 2013-09-28 NOTE — ED Provider Notes (Signed)
CSN: [id number]     Arrival date & time 09/28/13  0135 History   First MD Initiated Contact with Patient 09/28/13 0141     Chief Complaint  Patient presents with  . Chest Pain     Patient is a 48 y.o. female presenting with chest pain. The history is provided by the patient.  Chest Pain Pain location:  L chest Pain quality: sharp   Pain radiates to:  Does not radiate Pain severity:  Moderate Onset quality:  Gradual Duration:  3 hours Timing:  Constant Progression:  Improving Relieved by:  Nothing Worsened by:  Nothing tried Associated symptoms: anxiety, heartburn, nausea and vomiting   Associated symptoms: no diaphoresis, no dizziness, no lower extremity edema, no shortness of breath and no weakness   Risk factors: high cholesterol   Risk factors: no diabetes mellitus, no prior DVT/PE and no smoking   pt reports having episode of CP tonight She reports it is somewhat similar to prior reflux but worse She does admit to eating food that may have triggered the pain  She reports while in the ED she had small amt of vomiting  She denies h/o CAD/PE She reports cardiac cath in the 1990s that was "normal" and reports "normal stress test" in the past few years    Past Medical History  Diagnosis Date  . Acid reflux   . Hypercholesteremia   . Complication of anesthesia     pt took something at [facility], bp bottomed out  . Thyroid disease    Past Surgical History  Procedure Laterality Date  . Abdominal hysterectomy    . Cholecystectomy    . Wrist surgery     Family History  Problem Relation Age of Onset  . Hypertension Mother   . Coronary artery disease Father   . Heart disease Father   . Coronary artery disease Paternal Uncle   . Coronary artery disease Cousin   . Barrett's esophagus Father    History  Substance Use Topics  . Smoking status: Former Smoker    Types: Cigarettes    Quit date: 05/20/1991  . Smokeless tobacco: Not on file     Comment: used to smoke a pack  in 1-2 weeks/ 1992  . Alcohol Use: Yes     Comment: rare   OB History   Grav Para Term Preterm Abortions TAB SAB Ect Mult Living                 Review of Systems  Constitutional: Negative for diaphoresis.  Respiratory: Negative for shortness of breath.   Cardiovascular: Positive for chest pain.  Gastrointestinal: Positive for heartburn, nausea and vomiting.  Neurological: Negative for dizziness and weakness.  Psychiatric/Behavioral: The patient is nervous/anxious.   All other systems reviewed and are negative.      Allergies  Codeine; Dilaudid; and Vicodin  Home Medications   Current Outpatient Rx  Name  Route  Sig  Dispense  Refill  . FLUoxetine (PROZAC) 10 MG capsule   Oral   Take 10 mg by mouth daily.         Marland Kitchen. ALPRAZolam (XANAX) 0.5 MG tablet   Oral   Take 0.25 mg by mouth daily as needed. For anxiety         . aspirin EC 325 MG tablet   Oral   Take 325 mg by mouth daily.         . citalopram (CELEXA) 20 MG tablet   Oral   Take 20 mg  by mouth daily.           Marland Kitchen dexlansoprazole (DEXILANT) 60 MG capsule   Oral   Take 1 capsule (60 mg total) by mouth daily.   31 capsule   2   . levothyroxine (SYNTHROID, LEVOTHROID) 50 MCG tablet   Oral   Take 50 mcg by mouth daily.          Marland Kitchen loratadine (CLARITIN) 10 MG tablet   Oral   Take 10 mg by mouth daily.         Marland Kitchen lovastatin (MEVACOR) 20 MG tablet   Oral   Take 40 mg by mouth daily.          . Multiple Vitamin (MULTIVITAMIN WITH MINERALS) TABS   Oral   Take 1 tablet by mouth daily.         . naproxen (NAPROSYN) 500 MG tablet   Oral   Take 1 tablet (500 mg total) by mouth 2 (two) times daily with a meal.   30 tablet   0   . ranitidine (ZANTAC) 150 MG tablet   Oral   Take 300 mg by mouth at bedtime.          . sulfamethoxazole-trimethoprim (SEPTRA DS) 800-160 MG per tablet   Oral   Take 1 tablet by mouth every 12 (twelve) hours.   20 tablet   0    BP 135/72  Pulse 81   Temp(Src) 97.9 F (36.6 C)  Resp 15  Ht 5\' 4"  (1.626 m)  Wt 220 lb (99.791 kg)  BMI 37.74 kg/m2  SpO2 97% Physical Exam CONSTITUTIONAL: Well developed/well nourished HEAD: Normocephalic/atraumatic EYES: EOMI/PERRL ENMT: Mucous membranes moist NECK: supple no meningeal signs SPINE:entire spine nontender CV: S1/S2 noted, no murmurs/rubs/gallops noted LUNGS: Lungs are clear to auscultation bilaterally, no apparent distress ABDOMEN: soft, nontender, no rebound or guarding GU:no cva tenderness NEURO: Pt is awake/alert, moves all extremitiesx4 EXTREMITIES: pulses normal, full ROM, no LE edema/tenderness SKIN: warm, color normal PSYCH: no abnormalities of mood noted  ED Course  Procedures   3:48 AM Low suspicion for ACS/PE/Dissection at this time She agrees to be monitored and have two cardiac troponins in the ED She requests meds for reflux 4:57 AM Pt sleeping, feels improved Initial labs negative Will check repeat troponin at 0647am with EKG 7:09 AM Repeat troponin negative EKG unchanged Pt improved She reports she did have some pain in left chest/shoulder with movement of upper body and while lying on her side Low suspicion for ACS at this time Stable for d/c home BP 125/70  Pulse 68  Temp(Src) 97.9 F (36.6 C)  Resp 18  Ht 5\' 4"  (1.626 m)  Wt 220 lb (99.791 kg)  BMI 37.74 kg/m2  SpO2 96%   Labs Review Labs Reviewed  I-STAT CHEM 8, ED - Abnormal; Notable for the following:    Glucose, Bld 128 (*)    Calcium, Ion 1.29 (*)    All other components within normal limits  I-STAT TROPOININ, ED  Rosezena Sensor, ED   Imaging Review Dg Chest 2 View  09/28/2013   CLINICAL DATA:  New chest pain  EXAM: CHEST  2 VIEW  COMPARISON:  DG CHEST 2 VIEW dated 03/28/2013  FINDINGS: Normal mediastinum and cardiac silhouette. Normal pulmonary vasculature. No evidence of effusion, infiltrate, or pneumothorax. No acute bony abnormality.  IMPRESSION: No acute cardiopulmonary process.    Electronically Signed   By: Genevive Bi M.D.   On: 09/28/2013 03:00  EKG Interpretation   Date/Time:  Saturday September 28 2013 01:59:39 EDT Ventricular Rate:  78 PR Interval:  156 QRS Duration: 82 QT Interval:  394 QTC Calculation: 449 R Axis:   35 Text Interpretation:  Normal sinus rhythm Normal ECG When compared with  ECG of 28-Mar-2013 19:02, No significant change was found Confirmed by  Bebe Shaggy  MD, Marquiz Sotelo ([id number]) on 09/28/2013 2:19:24 AM      EKG Interpretation  Date/Time:  Saturday September 28 2013 06:36:51 EDT Ventricular Rate:  67 PR Interval:  158 QRS Duration: 80 QT Interval:  406 QTC Calculation: 429 R Axis:   42 Text Interpretation:  Normal sinus rhythm Normal ECG When compared with ECG of 28-Sep-2013 01:59, No significant change was found Confirmed by Bebe Shaggy  MD, Dorinda Hill ([id number]) on 09/28/2013 6:41:47 AM       MDM   Final diagnoses:  Chest pain    Nursing notes including past medical history and social history reviewed and considered in documentation xrays reviewed and considered Labs/vital reviewed and considered Previous records reviewed and considered     Joya Gaskins, MD 09/28/13 [phone]

## 2013-09-28 NOTE — ED Notes (Signed)
Central chest pain began at 2300. Had 1 episode of nausea w/slight emesis.  Describes pain as an aches at present.  Denies dizziness, SOB.

## 2013-09-28 NOTE — Discharge Instructions (Signed)

## 2013-09-28 NOTE — ED Notes (Signed)
Patient up to [facility].  Had small amount of emesis.  C/O nausea.

## 2015-02-21 ENCOUNTER — Emergency Department (HOSPITAL_COMMUNITY)
Admission: EM | Admit: 2015-02-21 | Discharge: 2015-02-21 | Disposition: A | Discharge status: Home / Self Care | Payer: No Typology Code available for payment source | Attending: Emergency Medicine | Admitting: Emergency Medicine

## 2015-02-21 ENCOUNTER — Emergency Department (HOSPITAL_COMMUNITY): Payer: No Typology Code available for payment source

## 2015-02-21 ENCOUNTER — Encounter (HOSPITAL_COMMUNITY): Payer: Self-pay | Admitting: *Deleted

## 2015-02-21 DIAGNOSIS — R0602 Shortness of breath: Secondary | ICD-10-CM | POA: Diagnosis not present

## 2015-02-21 DIAGNOSIS — R112 Nausea with vomiting, unspecified: Secondary | ICD-10-CM | POA: Diagnosis not present

## 2015-02-21 DIAGNOSIS — K219 Gastro-esophageal reflux disease without esophagitis: Secondary | ICD-10-CM | POA: Diagnosis not present

## 2015-02-21 DIAGNOSIS — Z791 Long term (current) use of non-steroidal anti-inflammatories (NSAID): Secondary | ICD-10-CM | POA: Insufficient documentation

## 2015-02-21 DIAGNOSIS — M549 Dorsalgia, unspecified: Secondary | ICD-10-CM | POA: Insufficient documentation

## 2015-02-21 DIAGNOSIS — Z7982 Long term (current) use of aspirin: Secondary | ICD-10-CM | POA: Insufficient documentation

## 2015-02-21 DIAGNOSIS — R109 Unspecified abdominal pain: Secondary | ICD-10-CM | POA: Insufficient documentation

## 2015-02-21 DIAGNOSIS — E079 Disorder of thyroid, unspecified: Secondary | ICD-10-CM | POA: Insufficient documentation

## 2015-02-21 DIAGNOSIS — R079 Chest pain, unspecified: Secondary | ICD-10-CM | POA: Diagnosis present

## 2015-02-21 DIAGNOSIS — Z79899 Other long term (current) drug therapy: Secondary | ICD-10-CM | POA: Diagnosis not present

## 2015-02-21 DIAGNOSIS — E780 Pure hypercholesterolemia: Secondary | ICD-10-CM | POA: Insufficient documentation

## 2015-02-21 DIAGNOSIS — Z87891 Personal history of nicotine dependence: Secondary | ICD-10-CM | POA: Insufficient documentation

## 2015-02-21 DIAGNOSIS — R197 Diarrhea, unspecified: Secondary | ICD-10-CM | POA: Insufficient documentation

## 2015-02-21 DIAGNOSIS — R61 Generalized hyperhidrosis: Secondary | ICD-10-CM | POA: Diagnosis not present

## 2015-02-21 DIAGNOSIS — E119 Type 2 diabetes mellitus without complications: Secondary | ICD-10-CM | POA: Insufficient documentation

## 2015-02-21 LAB — CBC WITH DIFFERENTIAL/PLATELET
BASOS PCT: 0 % (ref 0–1)
Basophils Absolute: 0 10*3/uL (ref 0.0–0.1)
EOS PCT: 0 % (ref 0–5)
Eosinophils Absolute: 0 10*3/uL (ref 0.0–0.7)
HEMATOCRIT: 39.1 % (ref 36.0–46.0)
Hemoglobin: 13.2 g/dL (ref 12.0–15.0)
Lymphocytes Relative: 16 % (ref 12–46)
Lymphs Abs: 1.1 10*3/uL (ref 0.7–4.0)
MCH: 31.6 pg (ref 26.0–34.0)
MCHC: 33.8 g/dL (ref 30.0–36.0)
MCV: 93.5 fL (ref 78.0–100.0)
MONO ABS: 0.2 10*3/uL (ref 0.1–1.0)
Monocytes Relative: 4 % (ref 3–12)
NEUTROS ABS: 5.5 10*3/uL (ref 1.7–7.7)
Neutrophils Relative %: 80 % — ABNORMAL HIGH (ref 43–77)
PLATELETS: 204 10*3/uL (ref 150–400)
RBC: 4.18 MIL/uL (ref 3.87–5.11)
RDW: 12.8 % (ref 11.5–15.5)
WBC: 6.9 10*3/uL (ref 4.0–10.5)

## 2015-02-21 LAB — COMPREHENSIVE METABOLIC PANEL
ALBUMIN: 4.4 g/dL (ref 3.5–5.0)
ALT: 38 U/L (ref 14–54)
ANION GAP: 8 (ref 5–15)
AST: 47 U/L — ABNORMAL HIGH (ref 15–41)
Alkaline Phosphatase: 96 U/L (ref 38–126)
BILIRUBIN TOTAL: 0.5 mg/dL (ref 0.3–1.2)
BUN: 17 mg/dL (ref 6–20)
CHLORIDE: 107 mmol/L (ref 101–111)
CO2: 23 mmol/L (ref 22–32)
Calcium: 9.9 mg/dL (ref 8.9–10.3)
Creatinine, Ser: 0.83 mg/dL (ref 0.44–1.00)
GFR calc non Af Amer: >60 mL/min (ref >60)
GLUCOSE: 155 mg/dL — AB (ref 65–99)
POTASSIUM: 4.1 mmol/L (ref 3.5–5.1)
Sodium: 138 mmol/L (ref 135–145)
TOTAL PROTEIN: 7.6 g/dL (ref 6.5–8.1)

## 2015-02-21 LAB — I-STAT TROPONIN, ED
TROPONIN I, POC: 0 ng/mL (ref 0.00–0.08)
Troponin i, poc: 0 ng/mL (ref 0.00–0.08)

## 2015-02-21 MED ORDER — CYCLOBENZAPRINE HCL 10 MG PO TABS: 10.0000 mg | ORAL_TABLET | Freq: Three times a day (TID) | ORAL | Status: DC | PRN

## 2015-02-21 NOTE — ED Provider Notes (Signed)
CSN: [id number]     Arrival date & time 02/21/15  0805 History  This chart was scribed for Bethann Berkshire, MD by Ronney Lion, ED Scribe. This patient was seen in room APA19/AP[facility]19 and the patient's care was started at 8:41 AM.    Chief Complaint  Patient presents with  . Chest Pain   Patient is a 49 y.o. female presenting with chest pain. The history is provided by the patient. No language interpreter was used.  Chest Pain Pain location:  Substernal area Pain quality comment:  Squeezing Pain radiates to:  Does not radiate Pain radiates to the back: no   Onset quality:  Gradual Timing:  Constant Progression:  Worsening Chronicity:  Recurrent Context: eating   Relieved by:  Antacids Worsened by:  Nothing tried Ineffective treatments:  None tried Associated symptoms: abdominal pain (cramping in the morning), back pain, diaphoresis, nausea, shortness of breath and vomiting   Associated symptoms: no cough, no fatigue and no headache   Risk factors: diabetes mellitus   Risk factors: not female     HPI Comments: Candice Stanley is a 49 y.o. female with a history of DM, hyperlipidemia, and GERD, who presents to the Emergency Department complaining of center chest pain. Patient states she first had chest pain last night, after she ate [place] food. Because she has a history of "really bad" GERD, she then took a Zantac, which alleviated her sharp chest pains. Patient states she then woke up at 4 AM with squeezing chest pain and felt nauseated before vomiting "a little bit." Patient also notes she woke up with abdominal cramping and had several BMs. She endorses diaphoresis and SOB. She denies any chest pain at this time. Patient states that although she thought her symptoms this morning might be due to the [place] food, she came in to the ED out of concern because her father has a history of cardiac conditions, including a history of eight heart attacks. Patient states she had a cardiac stress test in the  past 2-3 years that was negative.   Patient also mentions having low back pain after working in the yard yesterday.  Past Medical History  Diagnosis Date  . Acid reflux   . Hypercholesteremia   . Complication of anesthesia     pt took something at [facility], bp bottomed out  . Thyroid disease   . Diabetes mellitus without complication    Past Surgical History  Procedure Laterality Date  . Abdominal hysterectomy    . Cholecystectomy    . Wrist surgery     Family History  Problem Relation Age of Onset  . Hypertension Mother   . Coronary artery disease Father   . Heart disease Father   . Coronary artery disease Paternal Uncle   . Coronary artery disease Cousin   . Barrett's esophagus Father    Social History  Substance Use Topics  . Smoking status: Former Smoker    Types: Cigarettes    Quit date: 05/20/1991  . Smokeless tobacco: None     Comment: used to smoke a pack in 1-2 weeks/ 1992  . Alcohol Use: Yes     Comment: rare   OB History    No data available     Review of Systems  Constitutional: Positive for diaphoresis. Negative for appetite change and fatigue.  HENT: Negative for congestion, ear discharge and sinus pressure.   Eyes: Negative for discharge.  Respiratory: Positive for shortness of breath. Negative for cough.   Cardiovascular:  Positive for chest pain.  Gastrointestinal: Positive for nausea, vomiting, abdominal pain (cramping in the morning) and diarrhea.  Genitourinary: Negative for frequency and hematuria.  Musculoskeletal: Positive for back pain.  Skin: Negative for rash.  Neurological: Negative for seizures and headaches.  Psychiatric/Behavioral: Negative for hallucinations.      Allergies  Codeine; Dilaudid; and Vicodin  Home Medications   Prior to Admission medications   Medication Sig Start Date End Date Taking? Authorizing Provider  ALPRAZolam Prudy Feeler) 0.5 MG tablet Take 0.25 mg by mouth daily as needed. For anxiety    Historical Provider,  MD  aspirin EC 325 MG tablet Take 325 mg by mouth daily.    Historical Provider, MD  citalopram (CELEXA) 20 MG tablet Take 20 mg by mouth daily.      Historical Provider, MD  dexlansoprazole (DEXILANT) 60 MG capsule Take 1 capsule (60 mg total) by mouth daily. 04/16/12   Joselyn Arrow, NP  FLUoxetine (PROZAC) 10 MG capsule Take 10 mg by mouth daily.    Historical Provider, MD  levothyroxine (SYNTHROID, LEVOTHROID) 50 MCG tablet Take 50 mcg by mouth daily.     Historical Provider, MD  loratadine (CLARITIN) 10 MG tablet Take 10 mg by mouth daily.    Historical Provider, MD  lovastatin (MEVACOR) 20 MG tablet Take 40 mg by mouth daily.     Historical Provider, MD  Multiple Vitamin (MULTIVITAMIN WITH MINERALS) TABS Take 1 tablet by mouth daily.    Historical Provider, MD  naproxen (NAPROSYN) 500 MG tablet Take 1 tablet (500 mg total) by mouth 2 (two) times daily with a meal. 08/26/13   Eber Hong, MD  ranitidine (ZANTAC) 150 MG tablet Take 300 mg by mouth at bedtime.     Historical Provider, MD  sulfamethoxazole-trimethoprim (SEPTRA DS) 800-160 MG per tablet Take 1 tablet by mouth every 12 (twelve) hours. 08/26/13   Eber Hong, MD   BP 143/67 mmHg  Pulse 73  Temp(Src) 97.8 F (36.6 C) (Oral)  Resp 16  Ht  (1.626 m)  Wt 230 lb (104.327 kg)  BMI 39.46 kg/m2  SpO2 99% Physical Exam  Constitutional: She is oriented to person, place, and time. She appears well-developed.  HENT:  Head: Normocephalic.  Eyes: Conjunctivae and EOM are normal. No scleral icterus.  Neck: Neck supple. No thyromegaly present.  Cardiovascular: Normal rate and regular rhythm.  Exam reveals no gallop and no friction rub.   No murmur heard. Pulmonary/Chest: No stridor. She has no wheezes. She has no rales. She exhibits no tenderness.  Abdominal: She exhibits no distension. There is no tenderness. There is no rebound.  Musculoskeletal: Normal range of motion. She exhibits no edema.  Lymphadenopathy:    She has no  cervical adenopathy.  Neurological: She is oriented to person, place, and time. She exhibits normal muscle tone. Coordination normal.  Skin: No rash noted. No erythema.  Psychiatric: She has a normal mood and affect. Her behavior is normal.  Nursing note and vitals reviewed.   ED Course  Procedures (including critical care time)  DIAGNOSTIC STUDIES: Oxygen Saturation is 99% on RA, normal by my interpretation.    COORDINATION OF CARE: 8:45 AM - Discussed treatment plan with pt at bedside which includes diagnostic testing including blood tests and a XR, and pt agreed to plan.   Labs Review Labs Reviewed  CBC WITH DIFFERENTIAL/PLATELET - Abnormal; Notable for the following:    Neutrophils Relative % 80 (*)    All other components within normal  limits  COMPREHENSIVE METABOLIC PANEL - Abnormal; Notable for the following:    Glucose, Bld 155 (*)    AST 47 (*)    All other components within normal limits  Rosezena Sensor, ED    Imaging Review Dg Chest 2 View  02/21/2015   CLINICAL DATA:  Chest pain.  EXAM: CHEST  2 VIEW  COMPARISON:  September 28, 2013.  FINDINGS: The heart size and mediastinal contours are within normal limits. Both lungs are clear. No pneumothorax or pleural effusion is noted. The visualized skeletal structures are unremarkable.  IMPRESSION: No active cardiopulmonary disease.   Electronically Signed   By: Lupita Raider, M.D.   On: 02/21/2015 09:51      EKG Interpretation   Date/Time:  Saturday February 21 2015 08:18:15 EDT Ventricular Rate:  73 PR Interval:  246 QRS Duration: 88 QT Interval:  407 QTC Calculation: 448 R Axis:   0 Text Interpretation:  Sinus or ectopic atrial rhythm Prolonged PR interval  Indeterminate axis Inferoposterior infarct, recent Probable lateral  infarct, old Confirmed by Alexyia Guarino  MD, Shifra Swartzentruber [phone]) on 02/21/2015 8:26:54  AM      MDM   Final diagnoses:  None    Chest pain with unremarkable labs and ekg,  Doubt cad.  Pt no admits  to taking 1 gram of naprosyn bid yesterday.  Could be gi related.  Pt took naprosyn for low back pain.  tx back with flexeril,  Refer to cards for possible additional work up  The chart was scribed for me under my direct supervision.  I personally performed the history, physical, and medical decision making and all procedures in the evaluation of this patient.Bethann Berkshire, MD 02/21/15 [phone]

## 2015-02-21 NOTE — Discharge Instructions (Signed)
Follow up with [facility] for recheck of chest pain and follow up with your family md for back pain

## 2015-02-21 NOTE — ED Notes (Signed)
Pt states center chest pain began last night. Pt belching. States hx of reflux and states she had [place] last night. Pain does not radiate. Pt states lower back pain also after working in the yard yesterday. States she decided to come to ER this morning because she began having a squeezing sensation to her chest. States nausea. Vomited x 1 "just a little"

## 2015-09-02 ENCOUNTER — Emergency Department (HOSPITAL_COMMUNITY): Payer: 59

## 2015-09-02 ENCOUNTER — Observation Stay (HOSPITAL_COMMUNITY)
Admission: EM | Admit: 2015-09-02 | Discharge: 2015-09-02 | Disposition: A | Discharge status: Home / Self Care | Payer: 59 | Attending: Emergency Medicine | Admitting: Emergency Medicine

## 2015-09-02 ENCOUNTER — Encounter (HOSPITAL_COMMUNITY): Payer: Self-pay | Admitting: Emergency Medicine

## 2015-09-02 DIAGNOSIS — Z8249 Family history of ischemic heart disease and other diseases of the circulatory system: Secondary | ICD-10-CM | POA: Diagnosis not present

## 2015-09-02 DIAGNOSIS — R0789 Other chest pain: Secondary | ICD-10-CM | POA: Diagnosis present

## 2015-09-02 DIAGNOSIS — E785 Hyperlipidemia, unspecified: Secondary | ICD-10-CM | POA: Diagnosis not present

## 2015-09-02 DIAGNOSIS — R112 Nausea with vomiting, unspecified: Secondary | ICD-10-CM | POA: Diagnosis not present

## 2015-09-02 DIAGNOSIS — E039 Hypothyroidism, unspecified: Secondary | ICD-10-CM | POA: Diagnosis not present

## 2015-09-02 DIAGNOSIS — Z791 Long term (current) use of non-steroidal anti-inflammatories (NSAID): Secondary | ICD-10-CM | POA: Diagnosis not present

## 2015-09-02 DIAGNOSIS — R42 Dizziness and giddiness: Secondary | ICD-10-CM | POA: Diagnosis not present

## 2015-09-02 DIAGNOSIS — K219 Gastro-esophageal reflux disease without esophagitis: Secondary | ICD-10-CM | POA: Insufficient documentation

## 2015-09-02 DIAGNOSIS — Z7982 Long term (current) use of aspirin: Secondary | ICD-10-CM | POA: Insufficient documentation

## 2015-09-02 DIAGNOSIS — R079 Chest pain, unspecified: Principal | ICD-10-CM | POA: Insufficient documentation

## 2015-09-02 DIAGNOSIS — I200 Unstable angina: Secondary | ICD-10-CM

## 2015-09-02 DIAGNOSIS — R0602 Shortness of breath: Secondary | ICD-10-CM | POA: Diagnosis not present

## 2015-09-02 DIAGNOSIS — E119 Type 2 diabetes mellitus without complications: Secondary | ICD-10-CM | POA: Insufficient documentation

## 2015-09-02 DIAGNOSIS — Z87891 Personal history of nicotine dependence: Secondary | ICD-10-CM | POA: Insufficient documentation

## 2015-09-02 DIAGNOSIS — Z7984 Long term (current) use of oral hypoglycemic drugs: Secondary | ICD-10-CM | POA: Diagnosis not present

## 2015-09-02 DIAGNOSIS — E7800 Pure hypercholesterolemia, unspecified: Secondary | ICD-10-CM | POA: Diagnosis not present

## 2015-09-02 DIAGNOSIS — Z79899 Other long term (current) drug therapy: Secondary | ICD-10-CM | POA: Insufficient documentation

## 2015-09-02 HISTORY — DX: Type 2 diabetes mellitus without complications: E11.9

## 2015-09-02 HISTORY — DX: Gastro-esophageal reflux disease without esophagitis: K21.9

## 2015-09-02 HISTORY — DX: Hypothyroidism, unspecified: E03.9

## 2015-09-02 LAB — CBC
HCT: 39.7 % (ref 36.0–46.0)
Hemoglobin: 13.5 g/dL (ref 12.0–15.0)
MCH: 31.5 pg (ref 26.0–34.0)
MCHC: 34 g/dL (ref 30.0–36.0)
MCV: 92.5 fL (ref 78.0–100.0)
Platelets: 189 10*3/uL (ref 150–400)
RBC: 4.29 MIL/uL (ref 3.87–5.11)
RDW: 12.7 % (ref 11.5–15.5)
WBC: 6.8 10*3/uL (ref 4.0–10.5)

## 2015-09-02 LAB — BASIC METABOLIC PANEL
Anion gap: 6 (ref 5–15)
BUN: 12 mg/dL (ref 6–20)
CO2: 28 mmol/L (ref 22–32)
Calcium: 10.1 mg/dL (ref 8.9–10.3)
Chloride: 107 mmol/L (ref 101–111)
Creatinine, Ser: 0.77 mg/dL (ref 0.44–1.00)
GFR calc Af Amer: >60 mL/min (ref >60)
GFR calc non Af Amer: >60 mL/min (ref >60)
Glucose, Bld: 137 mg/dL — ABNORMAL HIGH (ref 65–99)
Potassium: 4.3 mmol/L (ref 3.5–5.1)
Sodium: 141 mmol/L (ref 135–145)

## 2015-09-02 LAB — TROPONIN I
Troponin I: <0.03 ng/mL (ref <0.031)
Troponin I: <0.03 ng/mL (ref <0.031)

## 2015-09-02 MED ORDER — ASPIRIN 325 MG PO TABS
325.0000 mg | ORAL_TABLET | Freq: Once | ORAL | Status: AC
  Administered 2015-09-02: 325 mg via ORAL
  Filled 2015-09-02: qty 1

## 2015-09-02 NOTE — Consult Note (Signed)
Requesting provider: Dr. Donnetta Hutching Consulting cardiologist: Dr. Jonelle Sidle  Reason for consultation: Chest pain  Clinical Summary [name] is a 50 y.o.female with past medical history outlined below, presenting to the ER today complaining of recurrent chest tightness that began earlier this morning. She relates that she has been under a significant amount of stress at work, was tearful today discussing her symptoms. She went to bed last night with a headache, states that it was worse this morning similar to her migraines. She began experiencing chest tightness, radiation to left arm, also intermittent nausea. She felt somewhat dizzy as well, took a dose of OTC meclizine. She became more anxious about her symptoms and presented for further assessment in the ER.  She reports a substantial family history of premature CAD and significant personal concern about her present cardiac status. She reports having a cardiac catheterization that was normal back in the 1990s. She has had interval evaluation by our practice over the years was last seen in consultation by Dr. admission back in 2013. He was to have a follow-up GXT at that time, although I am not certain that it was completed. She has had previous normal stress tests.  Under observation in the ER, initial troponin I is negative, ECG shows no acute ST segment changes. Chest x-ray shows no acute abnormalities.   Allergies  Allergen Reactions  . Codeine Nausea And Vomiting  . Dilaudid [Hydromorphone Hcl]     Drop in blood pressure  . Tape Swelling and Other (See Comments)    Rash(paper tape)  . Vicodin [Hydrocodone-Acetaminophen] Nausea And Vomiting    Home Medications No current facility-administered medications on file prior to encounter.   Current Outpatient Prescriptions on File Prior to Encounter  Medication Sig Dispense Refill  . aspirin EC 325 MG tablet Take 325 mg by mouth daily.    Marland Kitchen dexlansoprazole (DEXILANT) 60 MG  capsule Take 1 capsule (60 mg total) by mouth daily. 31 capsule 2  . levothyroxine (SYNTHROID, LEVOTHROID) 50 MCG tablet Take 50 mcg by mouth daily.     Marland Kitchen lovastatin (MEVACOR) 20 MG tablet Take 40 mg by mouth daily.     . metFORMIN (GLUCOPHAGE) 500 MG tablet Take 500 mg by mouth daily with breakfast.    . Multiple Vitamin (MULTIVITAMIN WITH MINERALS) TABS Take 1 tablet by mouth daily.    . ranitidine (ZANTAC) 150 MG tablet Take 300 mg by mouth at bedtime.     . ALPRAZolam (XANAX) 0.5 MG tablet Take 0.25 mg by mouth daily as needed. For anxiety    . cyclobenzaprine (FLEXERIL) 10 MG tablet Take 1 tablet (10 mg total) by mouth 3 (three) times daily as needed for muscle spasms. (Patient not taking: Reported on 09/02/2015) 20 tablet 0  . loratadine (CLARITIN) 10 MG tablet Take 10 mg by mouth daily.    . naproxen (NAPROSYN) 500 MG tablet Take 1 tablet (500 mg total) by mouth 2 (two) times daily with a meal. (Patient not taking: Reported on 09/02/2015) 30 tablet 0  . sulfamethoxazole-trimethoprim (SEPTRA DS) 800-160 MG per tablet Take 1 tablet by mouth every 12 (twelve) hours. (Patient not taking: Reported on 02/21/2015) 20 tablet 0    Past Medical History  Diagnosis Date  . GERD (gastroesophageal reflux disease)   . Hypercholesteremia   . Hypothyroidism   . Type 2 diabetes mellitus [facility])     Past Surgical History  Procedure Laterality Date  . Abdominal hysterectomy    . Cholecystectomy    .  Wrist surgery      Family History  Problem Relation Age of Onset  . Hypertension Mother   . Coronary artery disease Father   . Heart disease Father   . Coronary artery disease Paternal Uncle   . Coronary artery disease Cousin   . Barrett's esophagus Father     Social History Ms. Raske reports that she quit smoking about 24 years ago. Her smoking use included Cigarettes. She does not have any smokeless tobacco history on file. Ms. Mellone reports that she drinks alcohol.  Review of  Systems Complete review of systems negative except as otherwise outlined in the clinical summary and also the following. Psychosocial stressors, intermittent migraines, anxiety about her health. Intermittent reflux.  Physical Examination Blood pressure 133/80, pulse 75, temperature 97.8 F (36.6 C), temperature source Oral, resp. rate 20, height  (1.626 m), weight 245 lb (111.131 kg), SpO2 99 %. No intake or output data in the 24 hours ending 09/02/15 1441  Telemetry: Sinus rhythm.  Obese woman in no acute distress. HEENT: Conjunctiva and lids normal, oropharynx clear. Neck: Supple, no elevated JVP or carotid bruits, no thyromegaly. Lungs: Clear to auscultation, nonlabored breathing at rest. Cardiac: Regular rate and rhythm, no S3 or significant systolic murmur, no pericardial rub. Abdomen: Soft, nontender, bowel sounds present, no guarding or rebound. Extremities: No pitting edema, distal pulses 2+. Skin: Warm and dry. Musculoskeletal: No kyphosis. Neuropsychiatric: Alert and oriented x3, affect grossly appropriate.   Lab Results  Basic Metabolic Panel:  Recent Labs Lab 09/02/15 0923  NA 141  K 4.3  CL 107  CO2 28  GLUCOSE 137*  BUN 12  CREATININE 0.77  CALCIUM 10.1    CBC:  Recent Labs Lab 09/02/15 0923  WBC 6.8  HGB 13.5  HCT 39.7  MCV 92.5  PLT 189    Cardiac Enzymes:  Recent Labs Lab 09/02/15 0923  TROPONINI <0.03    ECG I personally reviewed the tracing performed today which shows sinus rhythm with poor anterior R-wave progression, nonspecific T-wave changes.  Imaging Chest x-ray 09/02/2015: FINDINGS: Heart size is normal. Mediastinal shadows are normal. The lungs are clear. No bronchial thickening. No infiltrate, mass, effusion or collapse. Pulmonary vascularity is normal. No bony abnormality.  IMPRESSION: Normal chest  Impression  1. Chest pain with typical and atypical features, onset this morning as outlined above. Initial  troponin I is normal and ECG is nonspecific overall. She has an impressive family history of premature CAD, also personal cardiac risk factors including hyperlipidemia, obesity, and type 2 diabetes mellitus. Previous objective ischemic testing has been reassuring however, but she has not undergone any recent evaluations.  2. Type 2 diabetes mellitus, random glucose 137. She is on Glucophage as an outpatient.  3. Hyperlipidemia, on Mevacor.  4. Obesity.  5. GERD, on anti-reflux medications.  Recommendations  Reviewed records and discussed the situation with the patient, her husband, and mother in the room. Hospital observation for cycling of cardiac markers was initially recommended to Ms. Boston, however she is very hesitant to be admitted, mainly concerned about financial constraints and bills. She would like to go home and instead pursue further workup as an outpatient. I spoke with Dr. Adriana Simas who is obtaining a follow-up troponin I level. If she remains clinically stable and her cardiac enzyme trend is normal, we will plan to have her seen in the office this Friday by our nurse practitioner, and schedule an outpatient diagnostic radial approach cardiac catheterization to clarify coronary anatomy. We  did discuss potential for repeat stress testing, however she is somewhat wary of these tests and states that she would be much more convinced by a cardiac catheterization in light of her family history. Risks and benefits discussed, and she is in agreement to proceed at this point.  Jonelle Sidle, M.D., F.A.C.C.

## 2015-09-02 NOTE — ED Notes (Signed)
MD at bedside.  Pt expressing thoughts of wanting to leave.

## 2015-09-02 NOTE — ED Provider Notes (Addendum)
CSN: [id number]     Arrival date & time 09/02/15  8295 History  By signing my name below, I, Emmanuella Mensah, attest that this documentation has been prepared under the direction and in the presence of Donnetta Hutching, MD. Electronically Signed: Angelene Giovanni, ED Scribe. 09/02/2015. 11:32 AM.    Chief Complaint  Patient presents with  . Chest Pain   The history is provided by the patient. No language interpreter was used.   HPI Comments: Candice Stanley is a 50 y.o. female with a hx of GERD, DM, hypercholesteremia, and hypothyroidism who presents to the Emergency Department complaining of gradually worsening moderate squeezing left chest tightness that radiates to her left neck and left arm onset this am. She reports associated SOB, mild dizziness, nausea, and vomiting. She states that lifting her left breast makes the pain better. Pt has not taken any medications for her symptoms PTA. She reports that she had a normal heart catheterization in 1998 and a stress test. She states that her father had 2 CABG procedures and 8 MI. Pt is currently a Investment banker, corporate for the elderly and disabled, stating that she has a lot of stress from her work. She denies any fever or chills.   PCP: Dr. Olena Leatherwood in [place], [place].    Past Medical History  Diagnosis Date  . GERD (gastroesophageal reflux disease)   . Hypercholesteremia   . Hypothyroidism   . Type 2 diabetes mellitus [facility])    Past Surgical History  Procedure Laterality Date  . Abdominal hysterectomy    . Cholecystectomy    . Wrist surgery     Family History  Problem Relation Age of Onset  . Hypertension Mother   . Coronary artery disease Father   . Heart disease Father   . Coronary artery disease Paternal Uncle   . Coronary artery disease Cousin   . Barrett's esophagus Father    Social History  Substance Use Topics  . Smoking status: Former Smoker    Types: Cigarettes    Quit date: 05/20/1991  . Smokeless tobacco: None     Comment: Used to  smoke a pack in 1-2 weeks/ 1992  . Alcohol Use: 0.0 oz/week    0 Standard drinks or equivalent per week     Comment: Rare   OB History    No data available     Review of Systems  Constitutional: Negative for fever and chills.  Respiratory: Positive for shortness of breath.   Cardiovascular: Positive for chest pain.  Gastrointestinal: Positive for nausea and vomiting.  Neurological: Positive for dizziness.      Allergies  Codeine; Dilaudid; Tape; and Vicodin  Home Medications   Prior to Admission medications   Medication Sig Start Date End Date Taking? Authorizing Provider  aspirin EC 325 MG tablet Take 325 mg by mouth daily.   Yes Historical Provider, MD  buPROPion (WELLBUTRIN XL) 150 MG 24 hr tablet Take 150 mg by mouth daily.   Yes Historical Provider, MD  dexlansoprazole (DEXILANT) 60 MG capsule Take 1 capsule (60 mg total) by mouth daily. 04/16/12  Yes Joselyn Arrow, NP  FLUoxetine (PROZAC) 40 MG capsule Take 40 mg by mouth daily.   Yes Historical Provider, MD  levothyroxine (SYNTHROID, LEVOTHROID) 50 MCG tablet Take 50 mcg by mouth daily.    Yes Historical Provider, MD  lovastatin (MEVACOR) 20 MG tablet Take 40 mg by mouth daily.    Yes Historical Provider, MD  metFORMIN (GLUCOPHAGE) 500 MG tablet Take 500  mg by mouth daily with breakfast.   Yes Historical Provider, MD  Multiple Vitamin (MULTIVITAMIN WITH MINERALS) TABS Take 1 tablet by mouth daily.   Yes Historical Provider, MD  ranitidine (ZANTAC) 150 MG tablet Take 300 mg by mouth at bedtime.    Yes Historical Provider, MD  ALPRAZolam Prudy Feeler) 0.5 MG tablet Take 0.25 mg by mouth daily as needed. For anxiety    Historical Provider, MD  cyclobenzaprine (FLEXERIL) 10 MG tablet Take 1 tablet (10 mg total) by mouth 3 (three) times daily as needed for muscle spasms. Patient not taking: Reported on 09/02/2015 02/21/15   Bethann Berkshire, MD  loratadine (CLARITIN) 10 MG tablet Take 10 mg by mouth daily.    Historical Provider, MD   naproxen (NAPROSYN) 500 MG tablet Take 1 tablet (500 mg total) by mouth 2 (two) times daily with a meal. Patient not taking: Reported on 09/02/2015 08/26/13   Eber Hong, MD  sulfamethoxazole-trimethoprim (SEPTRA DS) 800-160 MG per tablet Take 1 tablet by mouth every 12 (twelve) hours. Patient not taking: Reported on 02/21/2015 08/26/13   Eber Hong, MD   BP 133/80 mmHg  Pulse 75  Temp(Src) 97.8 F (36.6 C) (Oral)  Resp 20  Ht  (1.626 m)  Wt 245 lb (111.131 kg)  BMI 42.03 kg/m2  SpO2 99% Physical Exam  Constitutional: She is oriented to person, place, and time. She appears well-developed and well-nourished.  Pt is obese, good color, NAD  HENT:  Head: Normocephalic and atraumatic.  Eyes: Conjunctivae and EOM are normal. Pupils are equal, round, and reactive to light.  Neck: Normal range of motion. Neck supple.  Cardiovascular: Normal rate and regular rhythm.   Pulmonary/Chest: Effort normal and breath sounds normal.  Abdominal: Soft. Bowel sounds are normal.  Musculoskeletal: Normal range of motion.  Neurological: She is alert and oriented to person, place, and time.  Skin: Skin is warm and dry.  Psychiatric: She has a normal mood and affect. Her behavior is normal.  Nursing note and vitals reviewed.   ED Course  Procedures (including critical care time) DIAGNOSTIC STUDIES: Oxygen Saturation is 97% on RA, normal by my interpretation.    COORDINATION OF CARE: 11:21 AM- Pt advised of plan for treatment and pt agrees. Pt will receive blood work, EKG, and chest x-ray for further evaluation.    Labs Review Labs Reviewed  BASIC METABOLIC PANEL - Abnormal; Notable for the following:    Glucose, Bld 137 (*)    All other components within normal limits  CBC  TROPONIN I  TROPONIN I    Imaging Review Dg Chest 2 View  09/02/2015  CLINICAL DATA:  Chest pain and pressure extending to the left neck and arm. Symptoms began last night. EXAM: CHEST  2 VIEW COMPARISON:   02/21/2015 FINDINGS: Heart size is normal. Mediastinal shadows are normal. The lungs are clear. No bronchial thickening. No infiltrate, mass, effusion or collapse. Pulmonary vascularity is normal. No bony abnormality. IMPRESSION: Normal chest Electronically Signed   By: Paulina Fusi M.D.   On: 09/02/2015 09:23   Donnetta Hutching, MD has personally reviewed and evaluated these images and lab results as part of his medical decision-making.   EKG Interpretation   Date/Time:  Wednesday September 02 2015 08:55:10 EST Ventricular Rate:  69 PR Interval:  136 QRS Duration: 84 QT Interval:  392 QTC Calculation: 420 R Axis:   24 Text Interpretation:  Normal sinus rhythm Cannot rule out Anterior infarct  , age undetermined Abnormal ECG Confirmed  by Adriana Simas  MD, Kionna Brier ([id number]) on  09/02/2015 9:20:17 AM      MDM   Final diagnoses:  Chest pain, unspecified chest pain type   Patient has reasonable history for ACS along with several risk factors. She is stable. EKG and troponin negative. Will consult cardiology.  1400: Patient does not want to be admitted to the hospital. We discussed risk and benefits of discharge. She understands that this is her decision to go home. Troponins were negative 2.  Cardiology follow-up on Friday, February 24 I personally performed the services described in this documentation, which was scribed in my presence. The recorded information has been reviewed and is accurate.     Donnetta Hutching, MD 09/02/15 1237  Donnetta Hutching, MD 09/02/15 1354  Donnetta Hutching, MD 09/02/15 [phone]

## 2015-09-02 NOTE — Discharge Instructions (Signed)
Second troponin was normal. Follow-up at the cardiology office next door on Friday, February 24 at 1:50 PM with Joni Reining.  Call office to confirm appointment [phone].  Return if worse in anyway.

## 2015-09-02 NOTE — ED Notes (Signed)
Pt c/o cp radiating to left arm this am described as pressure with sob/n/v/dizziness.

## 2015-09-04 ENCOUNTER — Other Ambulatory Visit: Payer: Self-pay | Admitting: Adult Health

## 2015-09-04 ENCOUNTER — Encounter: Payer: Self-pay | Admitting: Adult Health

## 2015-09-04 ENCOUNTER — Ambulatory Visit (INDEPENDENT_AMBULATORY_CARE_PROVIDER_SITE_OTHER): Payer: PRIVATE HEALTH INSURANCE | Admitting: Adult Health

## 2015-09-04 ENCOUNTER — Encounter: Payer: Self-pay | Admitting: *Deleted

## 2015-09-04 VITALS — BP 128/78 | HR 84 | Ht 64.0 in | Wt 251.0 lb

## 2015-09-04 DIAGNOSIS — E7800 Pure hypercholesterolemia, unspecified: Secondary | ICD-10-CM

## 2015-09-04 DIAGNOSIS — E0821 Diabetes mellitus due to underlying condition with diabetic nephropathy: Secondary | ICD-10-CM

## 2015-09-04 DIAGNOSIS — R079 Chest pain, unspecified: Secondary | ICD-10-CM

## 2015-09-04 MED ORDER — NITROGLYCERIN 0.4 MG SL SUBL: 0.4000 mg | SUBLINGUAL_TABLET | SUBLINGUAL | Status: DC | PRN

## 2015-09-04 NOTE — Progress Notes (Signed)
Cardiology Office Note   Date:  09/04/2015   ID:  Candice Stanley, DOB May 26, 1966, MRN [id number]  PCP:  Toma Deiters, MD  Cardiologist:  [place]/ Joni Reining, NP   No chief complaint on file.     History of Present Illness: Candice Stanley is a 50 y.o. female who presents for posthospitalization ER visit followup after having chest pain.  The patient was also complaining of an increased amount of stress at work, and was tearful discussing her symptoms.she was ruled out for myocardial infarction, with negative cardiac enzymes, and EKG.  She refused admission.  She is here on followup to discuss cardiac catheterization.  She continues to have occasional chest discomfort, which he describes as muscle spasm.  She did speak with Dr. Diona Browner concerning cardiac catheterization and is willing to proceed.  She has multiple questions about the procedure.  Past Medical History  Diagnosis Date  . GERD (gastroesophageal reflux disease)   . Hypercholesteremia   . Hypothyroidism   . Type 2 diabetes mellitus [facility])     Past Surgical History  Procedure Laterality Date  . Abdominal hysterectomy    . Cholecystectomy    . Wrist surgery       Current Outpatient Prescriptions  Medication Sig Dispense Refill  . ALPRAZolam (XANAX) 0.5 MG tablet Take 0.25 mg by mouth daily as needed. For anxiety    . aspirin EC 325 MG tablet Take 325 mg by mouth daily.    Marland Kitchen buPROPion (WELLBUTRIN XL) 150 MG 24 hr tablet Take 150 mg by mouth daily.    Marland Kitchen dexlansoprazole (DEXILANT) 60 MG capsule Take 1 capsule (60 mg total) by mouth daily. 31 capsule 2  . FLUoxetine (PROZAC) 40 MG capsule Take 40 mg by mouth daily.    Marland Kitchen levothyroxine (SYNTHROID, LEVOTHROID) 50 MCG tablet Take 50 mcg by mouth daily.     Marland Kitchen loratadine (CLARITIN) 10 MG tablet Take 10 mg by mouth daily.    Marland Kitchen lovastatin (MEVACOR) 20 MG tablet Take 40 mg by mouth daily.     . metFORMIN (GLUCOPHAGE) 500 MG tablet Take 500 mg by mouth daily with breakfast.     . Multiple Vitamin (MULTIVITAMIN WITH MINERALS) TABS Take 1 tablet by mouth daily.    . ranitidine (ZANTAC) 150 MG tablet Take 300 mg by mouth at bedtime.      No current facility-administered medications for this visit.    Allergies:   Codeine; Dilaudid; Tape; and Vicodin    Social History:  The patient  reports that she quit smoking about 24 years ago. Her smoking use included Cigarettes. She does not have any smokeless tobacco history on file. She reports that she drinks alcohol. She reports that she does not use illicit drugs.   Family History:  The patient's family history includes Barrett's esophagus in her father; Coronary artery disease in her cousin, father, and paternal uncle; Heart disease in her father; Hypertension in her mother.    ROS: All other systems are reviewed and negative. Unless otherwise mentioned in H&P    PHYSICAL EXAM: VS:  BP 128/78 mmHg  Pulse 84  Ht  (1.626 m)  Wt 251 lb (113.853 kg)  BMI 43.06 kg/m2  SpO2 98% , BMI Body mass index is 43.06 kg/(m^2). GEN: Well nourished, well developed, in no acute distress HEENT: normal Neck: no JVD, carotid bruits, or masses Cardiac: RRR; no murmurs, rubs, or gallops,no edema  Respiratory:  Clear to auscultation bilaterally, normal work of breathing GI:  soft, nontender, nondistended, + BS MS: no deformity or atrophy Skin: warm and dry, no rash Neuro:  Strength and sensation are intact Psych: euthymic mood, full affect   EKG:  The ekg ordered today demonstrates normal sinus rhythm with nonspecific anterior changes.   Recent Labs: 02/21/2015: ALT 38 09/02/2015: BUN 12; Creatinine, Ser 0.77; Hemoglobin 13.5; Platelets 189; Potassium 4.3; Sodium 141    Lipid Panel    Component Value Date/Time   CHOL 220* 01/17/2012 0522   TRIG 195* 01/17/2012 0522   HDL 48 01/17/2012 0522   CHOLHDL 4.6 01/17/2012 0522   VLDL 39 01/17/2012 0522   LDLCALC 133* 01/17/2012 0522      Wt Readings from Last 3  Encounters:  09/04/15 251 lb (113.853 kg)  09/02/15 245 lb (111.131 kg)  02/21/15 230 lb (104.327 kg)     ASSESSMENT AND PLAN:  1. Chest pain: she was seen in the emergency room on 08/03/2015 with recurrent chest pain.  She has multiple cardiovascular risk factors to include obesity, strong family history, obesity,hypercholesterolemia with diabetes. She is willing to proceed with coronary angiography and possible percutaneous intervention.  I have spoken to the patient at length about the procedure, risks and benefits, length of stay in the hospital.  Should she need to have intervention completed, and possibility of adding antiplatelet medication.  If stent is placed.  I answered numerous questions about the procedure and allowed for discussion of concerns.  She would like to proceed with cardiac catheterization on September 10, 2015. I provided a prescription for nitroglycerin sublingual should she have recurrent chest pain.  2. Hypercholesterolemia: she is currently on a statin medication.  Will need to have follow up lipids and LFTs with lab work to be completed prior to catheterization.  3. Diabetes: she will need to stop metformin prior to cardiac catheterization and the day following to avoid contrast-induced nephropathy.  She is currently not on an ACE inhibitor.  4. Obesity:reduce calorie intake, and increase exercise.  I recommended.   Current medicines are reviewed at length with the patient today.    Labs/ tests ordered today include: precardiac catheterization labs. No orders of the defined types were placed in this encounter.     Disposition:   FU with post cardiac catheterization. Signed, Joni Reining, NP  09/04/2015 2:22 PM    [facility] [address], [place], [place] [address] Phone: [phone]; Fax: [phone]

## 2015-09-04 NOTE — Progress Notes (Signed)
Name: Candice Stanley    DOB: [birth year]  Age: 50 y.o.  MR#: [id number]       PCP:  Toma Deiters, MD      Insurance: Payor: GENERIC COMMERCIAL / Plan: GENERIC COMMERCIAL / Product Type: *No Product type* /   CC:   No chief complaint on file.   VS Filed Vitals:   09/04/15 1400  BP: 128/78  Pulse: 84  Height: 5\' 4"  (1.626 m)  Weight: 251 lb (113.853 kg)  SpO2: 98%    Weights Current Weight  09/04/15 251 lb (113.853 kg)  09/02/15 245 lb (111.131 kg)  02/21/15 230 lb (104.327 kg)    Blood Pressure  BP Readings from Last 3 Encounters:  09/04/15 128/78  09/02/15 111/95  02/21/15 126/71     Admit date:  (Not on file) Last encounter with RMR:  Visit date not found   Allergy Codeine; Dilaudid; Tape; and Vicodin  Current Outpatient Prescriptions  Medication Sig Dispense Refill  . ALPRAZolam (XANAX) 0.5 MG tablet Take 0.25 mg by mouth daily as needed. For anxiety    . aspirin EC 325 MG tablet Take 325 mg by mouth daily.    Marland Kitchen buPROPion (WELLBUTRIN XL) 150 MG 24 hr tablet Take 150 mg by mouth daily.    Marland Kitchen dexlansoprazole (DEXILANT) 60 MG capsule Take 1 capsule (60 mg total) by mouth daily. 31 capsule 2  . FLUoxetine (PROZAC) 40 MG capsule Take 40 mg by mouth daily.    Marland Kitchen levothyroxine (SYNTHROID, LEVOTHROID) 50 MCG tablet Take 50 mcg by mouth daily.     Marland Kitchen loratadine (CLARITIN) 10 MG tablet Take 10 mg by mouth daily.    Marland Kitchen lovastatin (MEVACOR) 20 MG tablet Take 40 mg by mouth daily.     . metFORMIN (GLUCOPHAGE) 500 MG tablet Take 500 mg by mouth daily with breakfast.    . Multiple Vitamin (MULTIVITAMIN WITH MINERALS) TABS Take 1 tablet by mouth daily.    . ranitidine (ZANTAC) 150 MG tablet Take 300 mg by mouth at bedtime.      No current facility-administered medications for this visit.    Discontinued Meds:    Medications Discontinued During This Encounter  Medication Reason  . cyclobenzaprine (FLEXERIL) 10 MG tablet Error  . naproxen (NAPROSYN) 500 MG tablet Error  .  sulfamethoxazole-trimethoprim (SEPTRA DS) 800-160 MG per tablet Error    Patient Active Problem List   Diagnosis Date Noted  . Chest pain 09/02/2015  . Dysphagia 04/16/2012  . Odynophagia 04/16/2012  . Unspecified hypothyroidism 11/03/2009  . HYPERLIPIDEMIA 11/03/2009  . OBESITY, UNSPECIFIED 11/03/2009  . DYSTHYMIC DISORDER 11/03/2009  . Esophageal reflux 11/03/2009  . Diaphragmatic hernia 11/03/2009  . CHEST PAIN UNSPECIFIED 11/03/2009    LABS    Component Value Date/Time   NA 141 09/02/2015 0923   NA 138 02/21/2015 0900   NA 140 09/28/2013 0349   K 4.3 09/02/2015 0923   K 4.1 02/21/2015 0900   K 4.3 09/28/2013 0349   CL 107 09/02/2015 0923   CL 107 02/21/2015 0900   CL 105 09/28/2013 0349   CO2 28 09/02/2015 0923   CO2 23 02/21/2015 0900   CO2 24 08/26/2013 0501   GLUCOSE 137* 09/02/2015 0923   GLUCOSE 155* 02/21/2015 0900   GLUCOSE 128* 09/28/2013 0349   BUN 12 09/02/2015 0923   BUN 17 02/21/2015 0900   BUN 8 09/28/2013 0349   CREATININE 0.77 09/02/2015 0923   CREATININE 0.83 02/21/2015 0900   CREATININE 0.80 09/28/2013  0349   CALCIUM 10.1 09/02/2015 0923   CALCIUM 9.9 02/21/2015 0900   CALCIUM 9.9 08/26/2013 0501   GFRNONAA >60 09/02/2015 0923   GFRNONAA >60 02/21/2015 0900   GFRNONAA >90 08/26/2013 0501   GFRAA >60 09/02/2015 0923   GFRAA >60 02/21/2015 0900   GFRAA >90 08/26/2013 0501   CMP     Component Value Date/Time   NA 141 09/02/2015 0923   K 4.3 09/02/2015 0923   CL 107 09/02/2015 0923   CO2 28 09/02/2015 0923   GLUCOSE 137* 09/02/2015 0923   BUN 12 09/02/2015 0923   CREATININE 0.77 09/02/2015 0923   CALCIUM 10.1 09/02/2015 0923   PROT 7.6 02/21/2015 0900   ALBUMIN 4.4 02/21/2015 0900   AST 47* 02/21/2015 0900   ALT 38 02/21/2015 0900   ALKPHOS 96 02/21/2015 0900   BILITOT 0.5 02/21/2015 0900   GFRNONAA >60 09/02/2015 0923   GFRAA >60 09/02/2015 0923       Component Value Date/Time   WBC 6.8 09/02/2015 0923   WBC 6.9 02/21/2015  0900   WBC 8.9 08/26/2013 0501   HGB 13.5 09/02/2015 0923   HGB 13.2 02/21/2015 0900   HGB 13.6 09/28/2013 0349   HCT 39.7 09/02/2015 0923   HCT 39.1 02/21/2015 0900   HCT 40.0 09/28/2013 0349   MCV 92.5 09/02/2015 0923   MCV 93.5 02/21/2015 0900   MCV 94.9 08/26/2013 0501    Lipid Panel     Component Value Date/Time   CHOL 220* 01/17/2012 0522   TRIG 195* 01/17/2012 0522   HDL 48 01/17/2012 0522   CHOLHDL 4.6 01/17/2012 0522   VLDL 39 01/17/2012 0522   LDLCALC 133* 01/17/2012 0522    ABG    Component Value Date/Time   TCO2 24 09/28/2013 0349     No results found for: TSH BNP (last 3 results) No results for input(s): BNP in the last 8760 hours.  ProBNP (last 3 results) No results for input(s): PROBNP in the last 8760 hours.  Cardiac Panel (last 3 results)  Recent Labs  09/02/15 0923 09/02/15 1411  TROPONINI <0.03 <0.03    Iron/TIBC/Ferritin/ %Sat No results found for: IRON, TIBC, FERRITIN, IRONPCTSAT   EKG Orders placed or performed during the hospital encounter of 09/02/15  . EKG 12-Lead  . EKG 12-Lead  . ED EKG  . ED EKG  . EKG     Prior Assessment and Plan Problem List as of 09/04/2015      Respiratory   Diaphragmatic hernia     Digestive   Esophageal reflux   Last Assessment & Plan 04/16/2012 Office Visit Written 04/16/2012  4:02 PM by Joselyn Arrow, NP    See dysphagia      Dysphagia   Last Assessment & Plan 04/16/2012 Office Visit Edited 04/16/2012  4:02 PM by Joselyn Arrow, NP    Alease Medina Herbig is a pleasant 50 y.o. female with hx chronic refractory GERD on H2 blocker (Zantac) with solid-food dysphagia & odynophagia that started acutely after swallowing a potato.  Will need EGD with possible esophageal dilation with Dr. Jena Gauss to look for erosive esophagitis, esophageal web, ring, stricture, or Barrett's esophagus.  I have discussed risks & benefits which include, but are not limited to, bleeding, infection, perforation & drug reaction.  The  patient agrees with this plan & written consent will be obtained.  She has been taking a lot of NSAIDs & Goodys which may have contributed to her symptoms as well.  Avoid NSAIDS or aspirin products, use tylenol instead. Dexilant  daily for acid reflux             Endocrine   Unspecified hypothyroidism     Other   HYPERLIPIDEMIA   OBESITY, UNSPECIFIED   DYSTHYMIC DISORDER   CHEST PAIN UNSPECIFIED   Odynophagia   Last Assessment & Plan 04/16/2012 Office Visit Written 04/16/2012  4:02 PM by Joselyn Arrow, NP    See dysphagia      Chest pain       Imaging: Dg Chest 2 View  09/02/2015  CLINICAL DATA:  Chest pain and pressure extending to the left neck and arm. Symptoms began last night. EXAM: CHEST  2 VIEW COMPARISON:  02/21/2015 FINDINGS: Heart size is normal. Mediastinal shadows are normal. The lungs are clear. No bronchial thickening. No infiltrate, mass, effusion or collapse. Pulmonary vascularity is normal. No bony abnormality. IMPRESSION: Normal chest Electronically Signed   By: Paulina Fusi M.D.   On: 09/02/2015 09:23

## 2015-09-04 NOTE — Patient Instructions (Signed)
Your physician has requested that you have a cardiac catheterization. Cardiac catheterization is used to diagnose and/or treat various heart conditions. Doctors may recommend this procedure for a number of different reasons. The most common reason is to evaluate chest pain. Chest pain can be a symptom of coronary artery disease (CAD), and cardiac catheterization can show whether plaque is narrowing or blocking your heart's arteries. This procedure is also used to evaluate the valves, as well as measure the blood flow and oxygen levels in different parts of your heart. For further information please visit [email or web address] Please follow instruction sheet, as given.  Your physician recommends that you return for lab work in: Today  Do not take your Metformin the day of your Catheterization.  If you need a refill on your cardiac medications before your next appointment, please call your pharmacy.  Your physician has recommended you make the following change in your medication:   Start Nitro   Thank you for choosing [facility]!    Marland Kitchen

## 2015-09-05 LAB — PROTIME-INR
INR: 0.96 (ref <1.50)
Prothrombin Time: 12.9 seconds (ref 11.6–15.2)

## 2015-09-08 ENCOUNTER — Other Ambulatory Visit (HOSPITAL_COMMUNITY): Payer: Self-pay | Admitting: *Deleted

## 2015-09-10 ENCOUNTER — Ambulatory Visit (HOSPITAL_COMMUNITY)
Admission: RE | Admit: 2015-09-10 | Discharge: 2015-09-10 | Disposition: A | Discharge status: Home / Self Care | Payer: PRIVATE HEALTH INSURANCE | Source: Ambulatory Visit | Attending: Interventional Cardiology | Admitting: Interventional Cardiology

## 2015-09-10 ENCOUNTER — Encounter (HOSPITAL_COMMUNITY)
Admission: RE | Disposition: A | Discharge status: Home / Self Care | Payer: Self-pay | Source: Ambulatory Visit | Attending: Interventional Cardiology

## 2015-09-10 ENCOUNTER — Encounter (HOSPITAL_COMMUNITY): Payer: Self-pay | Admitting: Interventional Cardiology

## 2015-09-10 DIAGNOSIS — R072 Precordial pain: Secondary | ICD-10-CM | POA: Diagnosis not present

## 2015-09-10 DIAGNOSIS — E7800 Pure hypercholesterolemia, unspecified: Secondary | ICD-10-CM | POA: Insufficient documentation

## 2015-09-10 DIAGNOSIS — Z7982 Long term (current) use of aspirin: Secondary | ICD-10-CM | POA: Insufficient documentation

## 2015-09-10 DIAGNOSIS — Z8249 Family history of ischemic heart disease and other diseases of the circulatory system: Secondary | ICD-10-CM | POA: Insufficient documentation

## 2015-09-10 DIAGNOSIS — Z885 Allergy status to narcotic agent status: Secondary | ICD-10-CM | POA: Insufficient documentation

## 2015-09-10 DIAGNOSIS — E039 Hypothyroidism, unspecified: Secondary | ICD-10-CM | POA: Insufficient documentation

## 2015-09-10 DIAGNOSIS — G43909 Migraine, unspecified, not intractable, without status migrainosus: Secondary | ICD-10-CM | POA: Insufficient documentation

## 2015-09-10 DIAGNOSIS — Z6841 Body Mass Index (BMI) 40.0 and over, adult: Secondary | ICD-10-CM | POA: Insufficient documentation

## 2015-09-10 DIAGNOSIS — Z87891 Personal history of nicotine dependence: Secondary | ICD-10-CM | POA: Insufficient documentation

## 2015-09-10 DIAGNOSIS — E119 Type 2 diabetes mellitus without complications: Secondary | ICD-10-CM | POA: Insufficient documentation

## 2015-09-10 DIAGNOSIS — E669 Obesity, unspecified: Secondary | ICD-10-CM | POA: Insufficient documentation

## 2015-09-10 DIAGNOSIS — Z7984 Long term (current) use of oral hypoglycemic drugs: Secondary | ICD-10-CM | POA: Insufficient documentation

## 2015-09-10 DIAGNOSIS — K219 Gastro-esophageal reflux disease without esophagitis: Secondary | ICD-10-CM | POA: Insufficient documentation

## 2015-09-10 HISTORY — PX: CARDIAC CATHETERIZATION: SHX172

## 2015-09-10 LAB — GLUCOSE, CAPILLARY: Glucose-Capillary: 106 mg/dL — ABNORMAL HIGH (ref 65–99)

## 2015-09-10 SURGERY — LEFT HEART CATH AND CORONARY ANGIOGRAPHY
Anesthesia: LOCAL

## 2015-09-10 MED ORDER — HEPARIN SODIUM (PORCINE) 1000 UNIT/ML IJ SOLN
INTRAMUSCULAR | Status: DC | PRN
  Administered 2015-09-10: 6000 [IU] via INTRAVENOUS

## 2015-09-10 MED ORDER — SODIUM CHLORIDE 0.9 % IV SOLN
INTRAVENOUS | Status: DC
  Administered 2015-09-10: 07:00:00 via INTRAVENOUS

## 2015-09-10 MED ORDER — FENTANYL CITRATE (PF) 100 MCG/2ML IJ SOLN
INTRAMUSCULAR | Status: DC | PRN
  Administered 2015-09-10: 25 ug via INTRAVENOUS
  Administered 2015-09-10: 50 ug via INTRAVENOUS

## 2015-09-10 MED ORDER — LIDOCAINE HCL (PF) 1 % IJ SOLN
INTRAMUSCULAR | Status: AC
  Filled 2015-09-10: qty 30

## 2015-09-10 MED ORDER — ASPIRIN 81 MG PO CHEW
CHEWABLE_TABLET | ORAL | Status: AC
  Filled 2015-09-10: qty 1

## 2015-09-10 MED ORDER — LIDOCAINE HCL (PF) 1 % IJ SOLN
INTRAMUSCULAR | Status: DC | PRN
  Administered 2015-09-10: 4 mL via INTRADERMAL

## 2015-09-10 MED ORDER — SODIUM CHLORIDE 0.9 % IV SOLN: 250.0000 mL | INTRAVENOUS | Status: DC | PRN

## 2015-09-10 MED ORDER — SODIUM CHLORIDE 0.9 % WEIGHT BASED INFUSION: 1.0000 mL/kg/h | INTRAVENOUS | Status: DC

## 2015-09-10 MED ORDER — FENTANYL CITRATE (PF) 100 MCG/2ML IJ SOLN
INTRAMUSCULAR | Status: AC
  Filled 2015-09-10: qty 2

## 2015-09-10 MED ORDER — SODIUM CHLORIDE 0.9% FLUSH: 3.0000 mL | Freq: Two times a day (BID) | INTRAVENOUS | Status: DC

## 2015-09-10 MED ORDER — MIDAZOLAM HCL 2 MG/2ML IJ SOLN
INTRAMUSCULAR | Status: AC
  Filled 2015-09-10: qty 2

## 2015-09-10 MED ORDER — VERAPAMIL HCL 2.5 MG/ML IV SOLN
INTRAVENOUS | Status: DC | PRN
  Administered 2015-09-10: 10 mL via INTRA_ARTERIAL

## 2015-09-10 MED ORDER — MIDAZOLAM HCL 2 MG/2ML IJ SOLN
INTRAMUSCULAR | Status: DC | PRN
  Administered 2015-09-10: 1 mg via INTRAVENOUS
  Administered 2015-09-10: 2 mg via INTRAVENOUS

## 2015-09-10 MED ORDER — ASPIRIN 81 MG PO CHEW
81.0000 mg | CHEWABLE_TABLET | ORAL | Status: AC
  Administered 2015-09-10: 81 mg via ORAL

## 2015-09-10 MED ORDER — SODIUM CHLORIDE 0.9% FLUSH: 3.0000 mL | INTRAVENOUS | Status: DC | PRN

## 2015-09-10 MED ORDER — HEPARIN (PORCINE) IN NACL 2-0.9 UNIT/ML-% IJ SOLN
INTRAMUSCULAR | Status: DC | PRN
  Administered 2015-09-10: 1500 mL

## 2015-09-10 MED ORDER — IOHEXOL 350 MG/ML SOLN
INTRAVENOUS | Status: DC | PRN
  Administered 2015-09-10: 55 mL via INTRA_ARTERIAL

## 2015-09-10 MED ORDER — VERAPAMIL HCL 2.5 MG/ML IV SOLN
INTRAVENOUS | Status: AC
  Filled 2015-09-10: qty 2

## 2015-09-10 MED ORDER — HEPARIN (PORCINE) IN NACL 2-0.9 UNIT/ML-% IJ SOLN
INTRAMUSCULAR | Status: AC
  Filled 2015-09-10: qty 1500

## 2015-09-10 MED ORDER — HEPARIN SODIUM (PORCINE) 1000 UNIT/ML IJ SOLN
INTRAMUSCULAR | Status: AC
  Filled 2015-09-10: qty 1

## 2015-09-10 SURGICAL SUPPLY — 13 items
CATH INFINITI 5 FR JL3.5 (CATHETERS) ×1 IMPLANT
CATH INFINITI 5FR ANG PIGTAIL (CATHETERS) ×1 IMPLANT
CATH INFINITI JR4 5F (CATHETERS) ×1 IMPLANT
DEVICE RAD COMP TR BAND LRG (VASCULAR PRODUCTS) ×2 IMPLANT
GLIDESHEATH SLEND SS 6F .021 (SHEATH) ×1 IMPLANT
GUIDEWIRE ANGLED .035X150CM (WIRE) ×1 IMPLANT
KIT HEART LEFT (KITS) ×2 IMPLANT
PACK CARDIAC CATHETERIZATION (CUSTOM PROCEDURE TRAY) ×2 IMPLANT
SYR MEDRAD MARK V 150ML (SYRINGE) ×2 IMPLANT
TRANSDUCER W/STOPCOCK (MISCELLANEOUS) ×2 IMPLANT
TUBING CIL FLEX 10 FLL-RA (TUBING) ×2 IMPLANT
WIRE HI TORQ VERSACORE-J 145CM (WIRE) ×1 IMPLANT
WIRE SAFE-T 1.5MM-J .035X260CM (WIRE) ×1 IMPLANT

## 2015-09-10 NOTE — Interval H&P Note (Signed)
Cath Lab Visit (complete for each Cath Lab visit)  Clinical Evaluation Leading to the Procedure:   ACS: Yes.    Non-ACS:    Anginal Classification: CCS IV  Anti-ischemic medical therapy: Minimal Therapy (1 class of medications)  Non-Invasive Test Results: No non-invasive testing performed  Prior CABG: No previous CABG      History and Physical Interval Note:  09/10/2015 7:46 AM  Candice Stanley  has presented today for surgery, with the diagnosis of cp  The various methods of treatment have been discussed with the patient and family. After consideration of risks, benefits and other options for treatment, the patient has consented to  Procedure(s): Left Heart Cath and Coronary Angiography (N/A) as a surgical intervention .  The patient's history has been reviewed, patient examined, no change in status, stable for surgery.  I have reviewed the patient's chart and labs.  Questions were answered to the patient's satisfaction.     Arnet Hofferber S.

## 2015-09-10 NOTE — Research (Signed)
CAD LAD Informed Consent   Subject Name: Candice Stanley  Subject met inclusion and exclusion criteria.  The informed consent form, study requirements and expectations were reviewed with the subject and questions and concerns were addressed prior to the signing of the consent form.  The subject verbalized understanding of the trail requirements.  The subject agreed to participate in the [facility] trial and signed the informed consent.  The informed consent was obtained prior to performance of any protocol-specific procedures for the subject.  A copy of the signed informed consent was given to the subject and a copy was placed in the subject's medical record.  Hedrick,Darius Fillingim W 09/10/2015, [id number]

## 2015-09-10 NOTE — H&P (View-Only) (Signed)
Requesting provider: Dr. Donnetta Hutching Consulting cardiologist: Dr. Jonelle Sidle  Reason for consultation: Chest pain  Clinical Summary Candice Stanley is a 50 y.o.female with past medical history outlined below, presenting to the ER today complaining of recurrent chest tightness that began earlier this morning. She relates that she has been under a significant amount of stress at work, was tearful today discussing her symptoms. She went to bed last night with a headache, states that it was worse this morning similar to her migraines. She began experiencing chest tightness, radiation to left arm, also intermittent nausea. She felt somewhat dizzy as well, took a dose of OTC meclizine. She became more anxious about her symptoms and presented for further assessment in the ER.  She reports a substantial family history of premature CAD and significant personal concern about her present cardiac status. She reports having a cardiac catheterization that was normal back in the 1990s. She has had interval evaluation by our practice over the years was last seen in consultation by Dr. admission back in 2013. He was to have a follow-up GXT at that time, although I am not certain that it was completed. She has had previous normal stress tests.  Under observation in the ER, initial troponin I is negative, ECG shows no acute ST segment changes. Chest x-ray shows no acute abnormalities.   Allergies  Allergen Reactions  . Codeine Nausea And Vomiting  . Dilaudid [Hydromorphone Hcl]     Drop in blood pressure  . Tape Swelling and Other (See Comments)    Rash(paper tape)  . Vicodin [Hydrocodone-Acetaminophen] Nausea And Vomiting    Home Medications No current facility-administered medications on file prior to encounter.   Current Outpatient Prescriptions on File Prior to Encounter  Medication Sig Dispense Refill  . aspirin EC 325 MG tablet Take 325 mg by mouth daily.    Marland Kitchen dexlansoprazole (DEXILANT) 60 MG  capsule Take 1 capsule (60 mg total) by mouth daily. 31 capsule 2  . levothyroxine (SYNTHROID, LEVOTHROID) 50 MCG tablet Take 50 mcg by mouth daily.     Marland Kitchen lovastatin (MEVACOR) 20 MG tablet Take 40 mg by mouth daily.     . metFORMIN (GLUCOPHAGE) 500 MG tablet Take 500 mg by mouth daily with breakfast.    . Multiple Vitamin (MULTIVITAMIN WITH MINERALS) TABS Take 1 tablet by mouth daily.    . ranitidine (ZANTAC) 150 MG tablet Take 300 mg by mouth at bedtime.     . ALPRAZolam (XANAX) 0.5 MG tablet Take 0.25 mg by mouth daily as needed. For anxiety    . cyclobenzaprine (FLEXERIL) 10 MG tablet Take 1 tablet (10 mg total) by mouth 3 (three) times daily as needed for muscle spasms. (Patient not taking: Reported on 09/02/2015) 20 tablet 0  . loratadine (CLARITIN) 10 MG tablet Take 10 mg by mouth daily.    . naproxen (NAPROSYN) 500 MG tablet Take 1 tablet (500 mg total) by mouth 2 (two) times daily with a meal. (Patient not taking: Reported on 09/02/2015) 30 tablet 0  . sulfamethoxazole-trimethoprim (SEPTRA DS) 800-160 MG per tablet Take 1 tablet by mouth every 12 (twelve) hours. (Patient not taking: Reported on 02/21/2015) 20 tablet 0    Past Medical History  Diagnosis Date  . GERD (gastroesophageal reflux disease)   . Hypercholesteremia   . Hypothyroidism   . Type 2 diabetes mellitus [facility])     Past Surgical History  Procedure Laterality Date  . Abdominal hysterectomy    . Cholecystectomy    .  Wrist surgery      Family History  Problem Relation Age of Onset  . Hypertension Mother   . Coronary artery disease Father   . Heart disease Father   . Coronary artery disease Paternal Uncle   . Coronary artery disease Cousin   . Barrett's esophagus Father     Social History Ms. Raske reports that she quit smoking about 24 years ago. Her smoking use included Cigarettes. She does not have any smokeless tobacco history on file. Ms. Mellone reports that she drinks alcohol.  Review of  Systems Complete review of systems negative except as otherwise outlined in the clinical summary and also the following. Psychosocial stressors, intermittent migraines, anxiety about her health. Intermittent reflux.  Physical Examination Blood pressure 133/80, pulse 75, temperature 97.8 F (36.6 C), temperature source Oral, resp. rate 20, height  (1.626 m), weight 245 lb (111.131 kg), SpO2 99 %. No intake or output data in the 24 hours ending 09/02/15 1441  Telemetry: Sinus rhythm.  Obese woman in no acute distress. HEENT: Conjunctiva and lids normal, oropharynx clear. Neck: Supple, no elevated JVP or carotid bruits, no thyromegaly. Lungs: Clear to auscultation, nonlabored breathing at rest. Cardiac: Regular rate and rhythm, no S3 or significant systolic murmur, no pericardial rub. Abdomen: Soft, nontender, bowel sounds present, no guarding or rebound. Extremities: No pitting edema, distal pulses 2+. Skin: Warm and dry. Musculoskeletal: No kyphosis. Neuropsychiatric: Alert and oriented x3, affect grossly appropriate.   Lab Results  Basic Metabolic Panel:  Recent Labs Lab 09/02/15 0923  NA 141  K 4.3  CL 107  CO2 28  GLUCOSE 137*  BUN 12  CREATININE 0.77  CALCIUM 10.1    CBC:  Recent Labs Lab 09/02/15 0923  WBC 6.8  HGB 13.5  HCT 39.7  MCV 92.5  PLT 189    Cardiac Enzymes:  Recent Labs Lab 09/02/15 0923  TROPONINI <0.03    ECG I personally reviewed the tracing performed today which shows sinus rhythm with poor anterior R-wave progression, nonspecific T-wave changes.  Imaging Chest x-ray 09/02/2015: FINDINGS: Heart size is normal. Mediastinal shadows are normal. The lungs are clear. No bronchial thickening. No infiltrate, mass, effusion or collapse. Pulmonary vascularity is normal. No bony abnormality.  IMPRESSION: Normal chest  Impression  1. Chest pain with typical and atypical features, onset this morning as outlined above. Initial  troponin I is normal and ECG is nonspecific overall. She has an impressive family history of premature CAD, also personal cardiac risk factors including hyperlipidemia, obesity, and type 2 diabetes mellitus. Previous objective ischemic testing has been reassuring however, but she has not undergone any recent evaluations.  2. Type 2 diabetes mellitus, random glucose 137. She is on Glucophage as an outpatient.  3. Hyperlipidemia, on Mevacor.  4. Obesity.  5. GERD, on anti-reflux medications.  Recommendations  Reviewed records and discussed the situation with the patient, her husband, and mother in the room. Hospital observation for cycling of cardiac markers was initially recommended to Ms. Boston, however she is very hesitant to be admitted, mainly concerned about financial constraints and bills. She would like to go home and instead pursue further workup as an outpatient. I spoke with Dr. Adriana Simas who is obtaining a follow-up troponin I level. If she remains clinically stable and her cardiac enzyme trend is normal, we will plan to have her seen in the office this Friday by our nurse practitioner, and schedule an outpatient diagnostic radial approach cardiac catheterization to clarify coronary anatomy. We  did discuss potential for repeat stress testing, however she is somewhat wary of these tests and states that she would be much more convinced by a cardiac catheterization in light of her family history. Risks and benefits discussed, and she is in agreement to proceed at this point.  Jonelle Sidle, M.D., F.A.C.C.

## 2015-09-10 NOTE — Discharge Instructions (Signed)
Radial Site Care °Refer to this sheet in the next few weeks. These instructions provide you with information about caring for yourself after your procedure. Your health care provider may also give you more specific instructions. Your treatment has been planned according to current medical practices, but problems sometimes occur. Call your health care provider if you have any problems or questions after your procedure. °WHAT TO EXPECT AFTER THE PROCEDURE °After your procedure, it is typical to have the following: °· Bruising at the radial site that usually fades within 1-2 weeks. °· Blood collecting in the tissue (hematoma) that may be painful to the touch. It should usually decrease in size and tenderness within 1-2 weeks. °HOME CARE INSTRUCTIONS °· Take medicines only as directed by your health care provider. °· You may shower 24-48 hours after the procedure or as directed by your health care provider. Remove the bandage (dressing) and gently wash the site with plain soap and water. Pat the area dry with a clean towel. Do not rub the site, because this may cause bleeding. °· Do not take baths, swim, or use a hot tub until your health care provider approves. °· Check your insertion site every day for redness, swelling, or drainage. °· Do not apply powder or lotion to the site. °· Do not flex or bend the affected arm for 24 hours or as directed by your health care provider. °· Do not push or pull heavy objects with the affected arm for 24 hours or as directed by your health care provider. °· Do not lift over 10 lb (4.5 kg) for 5 days after your procedure or as directed by your health care provider. °· Ask your health care provider when it is okay to: °¨ Return to work or school. °¨ Resume usual physical activities or sports. °¨ Resume sexual activity. °· Do not drive home if you are discharged the same day as the procedure. Have someone else drive you. °· You may drive 24 hours after the procedure unless otherwise  instructed by your health care provider. °· Do not operate machinery or power tools for 24 hours after the procedure. °· If your procedure was done as an outpatient procedure, which means that you went home the same day as your procedure, a responsible adult should be with you for the first 24 hours after you arrive home. °· Keep all follow-up visits as directed by your health care provider. This is important. °SEEK MEDICAL CARE IF: °· You have a fever. °· You have chills. °· You have increased bleeding from the radial site. Hold pressure on the site. °SEEK IMMEDIATE MEDICAL CARE IF: °· You have unusual pain at the radial site. °· You have redness, warmth, or swelling at the radial site. °· You have drainage (other than a small amount of blood on the dressing) from the radial site. °· The radial site is bleeding, and the bleeding does not stop after 30 minutes of holding steady pressure on the site. °· Your arm or hand becomes pale, cool, tingly, or numb. °  °This information is not intended to replace advice given to you by your health care provider. Make sure you discuss any questions you have with your health care provider. °  °Document Released: 07/30/2010 Document Revised: 07/18/2014 Document Reviewed: 01/13/2014 °Elsevier Interactive Patient Education ©2016 [employer] ° °

## 2015-09-14 ENCOUNTER — Telehealth: Payer: Self-pay | Admitting: Adult Health

## 2015-09-14 NOTE — Telephone Encounter (Signed)
Pt had a heart cath done last week and is having some pain in her arm where they did it and would like to speak w/ someone about it

## 2016-05-09 ENCOUNTER — Emergency Department (HOSPITAL_COMMUNITY)
Admission: EM | Admit: 2016-05-09 | Discharge: 2016-05-09 | Disposition: A | Discharge status: Home / Self Care | Payer: PRIVATE HEALTH INSURANCE | Source: Home / Self Care | Attending: Emergency Medicine | Admitting: Emergency Medicine

## 2016-05-09 ENCOUNTER — Encounter (HOSPITAL_COMMUNITY): Payer: Self-pay | Admitting: Emergency Medicine

## 2016-05-09 ENCOUNTER — Emergency Department (HOSPITAL_COMMUNITY)
Admission: EM | Admit: 2016-05-09 | Discharge: 2016-05-09 | Disposition: A | Discharge status: Home / Self Care | Payer: PRIVATE HEALTH INSURANCE | Attending: Emergency Medicine | Admitting: Emergency Medicine

## 2016-05-09 DIAGNOSIS — S60222A Contusion of left hand, initial encounter: Secondary | ICD-10-CM | POA: Insufficient documentation

## 2016-05-09 DIAGNOSIS — E119 Type 2 diabetes mellitus without complications: Secondary | ICD-10-CM | POA: Insufficient documentation

## 2016-05-09 DIAGNOSIS — Y999 Unspecified external cause status: Secondary | ICD-10-CM | POA: Insufficient documentation

## 2016-05-09 DIAGNOSIS — W260XXA Contact with knife, initial encounter: Secondary | ICD-10-CM | POA: Insufficient documentation

## 2016-05-09 DIAGNOSIS — E039 Hypothyroidism, unspecified: Secondary | ICD-10-CM

## 2016-05-09 DIAGNOSIS — S61432A Puncture wound without foreign body of left hand, initial encounter: Secondary | ICD-10-CM | POA: Diagnosis present

## 2016-05-09 DIAGNOSIS — Z7984 Long term (current) use of oral hypoglycemic drugs: Secondary | ICD-10-CM

## 2016-05-09 DIAGNOSIS — Y939 Activity, unspecified: Secondary | ICD-10-CM

## 2016-05-09 DIAGNOSIS — Y929 Unspecified place or not applicable: Secondary | ICD-10-CM | POA: Insufficient documentation

## 2016-05-09 DIAGNOSIS — Z7982 Long term (current) use of aspirin: Secondary | ICD-10-CM | POA: Insufficient documentation

## 2016-05-09 DIAGNOSIS — Z23 Encounter for immunization: Secondary | ICD-10-CM | POA: Diagnosis not present

## 2016-05-09 DIAGNOSIS — Z79899 Other long term (current) drug therapy: Secondary | ICD-10-CM | POA: Insufficient documentation

## 2016-05-09 DIAGNOSIS — S61412A Laceration without foreign body of left hand, initial encounter: Secondary | ICD-10-CM | POA: Insufficient documentation

## 2016-05-09 DIAGNOSIS — H1031 Unspecified acute conjunctivitis, right eye: Secondary | ICD-10-CM | POA: Diagnosis not present

## 2016-05-09 DIAGNOSIS — Y9389 Activity, other specified: Secondary | ICD-10-CM | POA: Diagnosis not present

## 2016-05-09 DIAGNOSIS — Z87891 Personal history of nicotine dependence: Secondary | ICD-10-CM | POA: Insufficient documentation

## 2016-05-09 MED ORDER — TETANUS-DIPHTH-ACELL PERTUSSIS 5-2.5-18.5 LF-MCG/0.5 IM SUSP
0.5000 mL | Freq: Once | INTRAMUSCULAR | Status: AC
  Administered 2016-05-09: 0.5 mL via INTRAMUSCULAR
  Filled 2016-05-09: qty 0.5

## 2016-05-09 MED ORDER — LEVOFLOXACIN 0.5 % OP SOLN: 1.0000 [drp] | OPHTHALMIC | 0 refills | Status: DC

## 2016-05-09 MED ORDER — LIDOCAINE HCL (PF) 2 % IJ SOLN
INTRAMUSCULAR | Status: AC
  Filled 2016-05-09: qty 10

## 2016-05-09 NOTE — Discharge Instructions (Signed)
Have stitch removed in 1 week Do not get area wet for 24 hours  For your eye: Plenty of hand washing. If not getting better in the next 1-2 days you can use eye drops.

## 2016-05-09 NOTE — ED Triage Notes (Signed)
Pt states she was just here and received stitches to L. Hand and now she is back because she is concerned with new swelling she has to L. Hand.

## 2016-05-09 NOTE — ED Triage Notes (Addendum)
Patient states she accidentally stabbed herself with a knife while trying to remove a zip tie. Patient has puncture wound noted to top of left hand. Bleeding controlled at this time.

## 2016-05-09 NOTE — ED Provider Notes (Signed)
[facility]Y DEPT Provider Note   CSN: [id number] Arrival date & time: 05/09/16  2105     History   Chief Complaint Chief Complaint  Patient presents with  . Hand Injury    HPI Candice Stanley is a 50 y.o. female who presents with L hand swelling. She was just seen by me several hours ago after she had accidentally stabbed herself with a knife. Bleeding was controlled at that time. One stitch was placed which patient tolerated well. After she went home she had increased pain and swelling to the left hand. Her husband put ice on it which seemed to help with the swelling.  HPI  Past Medical History:  Diagnosis Date  . GERD (gastroesophageal reflux disease)   . Hypercholesteremia   . Hypothyroidism   . Type 2 diabetes mellitus [facility])     Patient Active Problem List   Diagnosis Date Noted  . Precordial pain   . Family history of early CAD   . Chest pain 09/02/2015  . Dysphagia 04/16/2012  . Odynophagia 04/16/2012  . Unspecified hypothyroidism 11/03/2009  . HYPERLIPIDEMIA 11/03/2009  . OBESITY, UNSPECIFIED 11/03/2009  . DYSTHYMIC DISORDER 11/03/2009  . Esophageal reflux 11/03/2009  . Diaphragmatic hernia 11/03/2009  . CHEST PAIN UNSPECIFIED 11/03/2009    Past Surgical History:  Procedure Laterality Date  . ABDOMINAL HYSTERECTOMY    . CARDIAC CATHETERIZATION N/A 09/10/2015   Procedure: Left Heart Cath and Coronary Angiography;  Surgeon: Corky Crafts, MD;  Location: [facility] INVASIVE CV LAB;  Service: Cardiovascular;  Laterality: N/A;  . CHOLECYSTECTOMY    . WRIST SURGERY      OB History    No data available       Home Medications    Prior to Admission medications   Medication Sig Start Date End Date Taking? Authorizing Provider  ALPRAZolam Prudy Feeler) 0.5 MG tablet Take 0.25 mg by mouth daily as needed. For anxiety    Historical Provider, MD  aspirin EC 325 MG tablet Take 325 mg by mouth daily.    Historical Provider, MD  Biotin 5000 MCG TABS Take 1 tablet by  mouth daily.    Historical Provider, MD  buPROPion (WELLBUTRIN XL) 150 MG 24 hr tablet Take 150 mg by mouth daily.    Historical Provider, MD  dexlansoprazole (DEXILANT) 60 MG capsule Take 1 capsule (60 mg total) by mouth daily. 04/16/12   Joselyn Arrow, NP  FLUoxetine (PROZAC) 40 MG capsule Take 40 mg by mouth daily.    Historical Provider, MD  KRILL OIL PO Take 1 capsule by mouth daily.    Historical Provider, MD  levofloxacin Charlean Sanfilippo) 0.5 % ophthalmic solution Place 1 drop into the right eye every 2 (two) hours. While awake for 2 days. Then 1 drop every 4-8 hours for the next 5 days 05/09/16   Bethel Born, PA-C  levothyroxine (SYNTHROID, LEVOTHROID) 50 MCG tablet Take 50 mcg by mouth daily.     Historical Provider, MD  loratadine (CLARITIN) 10 MG tablet Take 10 mg by mouth daily.    Historical Provider, MD  lovastatin (MEVACOR) 20 MG tablet Take 40 mg by mouth daily.     Historical Provider, MD  metFORMIN (GLUCOPHAGE) 500 MG tablet Take 500 mg by mouth daily with breakfast.    Historical Provider, MD  Multiple Vitamin (MULTIVITAMIN WITH MINERALS) TABS Take 1 tablet by mouth daily.    Historical Provider, MD  nitroGLYCERIN (NITROSTAT) 0.4 MG SL tablet Place 1 tablet (0.4 mg total) under the  tongue every 5 (five) minutes as needed for chest pain. 09/04/15   Jodelle GrossKathryn M Lawrence, NP  ranitidine (ZANTAC) 150 MG tablet Take 300 mg by mouth at bedtime.     Historical Provider, MD    Family History Family History  Problem Relation Age of Onset  . Coronary artery disease Father   . Heart disease Father   . Barrett's esophagus Father   . Hypertension Mother   . Coronary artery disease Paternal Uncle   . Coronary artery disease Cousin     Social History Social History  Substance Use Topics  . Smoking status: Former Smoker    Types: Cigarettes    Quit date: 05/20/1991  . Smokeless tobacco: Never Used     Comment: Used to smoke a pack in 1-2 weeks/ 1992  . Alcohol use 0.0 oz/week      Comment: Rare     Allergies   Codeine; Dilaudid [hydromorphone hcl]; Tape; and Vicodin [hydrocodone-acetaminophen]   Review of Systems Review of Systems  Musculoskeletal:       Left hand pain and swelling  Skin: Positive for wound.     Physical Exam Updated Vital Signs BP 130/85 (BP Location: Left Arm)   Pulse 75   Temp 97.7 F (36.5 C) (Oral)   Resp 18   SpO2 98%   Physical Exam  Constitutional: She is oriented to person, place, and time. She appears well-developed and well-nourished. No distress.  HENT:  Head: Normocephalic and atraumatic.  Eyes: Conjunctivae are normal. Pupils are equal, round, and reactive to light. Right eye exhibits no discharge. Left eye exhibits no discharge. No scleral icterus.  Neck: Normal range of motion. Neck supple.  Cardiovascular: Normal rate and regular rhythm.   No murmur heard. Pulmonary/Chest: Effort normal and breath sounds normal. No respiratory distress.  Abdominal: Soft. She exhibits no distension. There is no tenderness.  Musculoskeletal: She exhibits no edema.  Left hand: Stitch intact without evidence of bleeding. There is a hematoma under the area most likely due to the wound being closed. Significant tenderness to palpation. N/V intact.   Neurological: She is alert and oriented to person, place, and time.  Skin: Skin is warm and dry.  Psychiatric: She has a normal mood and affect. Her behavior is normal.  Nursing note and vitals reviewed.    ED Treatments / Results  Labs (all labs ordered are listed, but only abnormal results are displayed) Labs Reviewed - No data to display  EKG  EKG Interpretation None       Radiology No results found.  Procedures Procedures (including critical care time)  Medications Ordered in ED Medications - No data to display   Initial Impression / Assessment and Plan / ED Course  I have reviewed the triage vital signs and the nursing notes.  Pertinent labs & imaging results  that were available during my care of the patient were reviewed by me and considered in my medical decision making (see chart for details).  Clinical Course   50 year old female presents with hematoma after wound was stitched closed. Advised warm compresses and Ibuprofen 600mg  three times daily. Also advised elevating hand. Return if symptoms are worsening. Patient and husband verbalized understanding.  Final Clinical Impressions(s) / ED Diagnoses   Final diagnoses:  Traumatic hematoma of left hand, initial encounter    New Prescriptions Discharge Medication List as of 05/09/2016  9:24 PM       Bethel BornKelly Marie Gekas, PA-C 05/09/16 2225    Jeannett SeniorStephen  Juleen ChinaKohut, MD 05/16/16 1217

## 2016-05-09 NOTE — Discharge Instructions (Signed)
Use warm compresses to reduce swelling. Alternate with ice Elevated hand above the level of the heart Take Ibuprofen 600-800mg  three times daily

## 2016-05-09 NOTE — ED Provider Notes (Signed)
[facility]-EMERGENCY DEPT Provider Note   CSN: [id number] Arrival date & time: 05/09/16  1736Ge  By signing my name below, I, Placido SouLogan Joldersma, attest that this documentation has been prepared under the direction and in the presence of Bethel BornKelly Marie Jonalyn Sedlak, PA-C. Electronically Signed: Placido SouLogan Joldersma, ED Scribe. 05/09/16. 6:01 PM.  History   Chief Complaint Chief Complaint  Patient presents with  . Hand Injury    HPI HPI Comments: Candice Stanley is a 50 y.o. female who is right hand dominant presents to the Emergency Department complaining of a small puncture wound to her left lateral hand with controlled bleeding which occurred PTA. Pt was cutting a zip-tie and the knife slipped resulting in her wound. She reports associated, mild, pain across the affected region which worsens with palpation. She is unsure of the timing of her most recent tetanus vaccination. No other associated symptoms at this time.   Incidentally patient states that she woke up with a red right eye. She does wear contacts and often forgets to take them out before she goes to bed. She is taking care of her daughter's 410 week old baby however states that baby does not go to daycare and has no one at home has similar symptoms. Denies fever, URI symptoms, blurry vision, or drainage from eye. She reports associated foreign body sensation and mild irritation.  The history is provided by the patient. No language interpreter was used.    Past Medical History:  Diagnosis Date  . GERD (gastroesophageal reflux disease)   . Hypercholesteremia   . Hypothyroidism   . Type 2 diabetes mellitus [facility](HCC)     Patient Active Problem List   Diagnosis Date Noted  . Precordial pain   . Family history of early CAD   . Chest pain 09/02/2015  . Dysphagia 04/16/2012  . Odynophagia 04/16/2012  . Unspecified hypothyroidism 11/03/2009  . HYPERLIPIDEMIA 11/03/2009  . OBESITY, UNSPECIFIED 11/03/2009  . DYSTHYMIC DISORDER 11/03/2009  . Esophageal  reflux 11/03/2009  . Diaphragmatic hernia 11/03/2009  . CHEST PAIN UNSPECIFIED 11/03/2009    Past Surgical History:  Procedure Laterality Date  . ABDOMINAL HYSTERECTOMY    . CARDIAC CATHETERIZATION N/A 09/10/2015   Procedure: Left Heart Cath and Coronary Angiography;  Surgeon: Corky CraftsJayadeep S Varanasi, MD;  [facility];  Service: Cardiovascular;  Laterality: N/A;  . CHOLECYSTECTOMY    . WRIST SURGERY      OB History    No data available       Home Medications    Prior to Admission medications   Medication Sig Start Date End Date Taking? Authorizing Provider  ALPRAZolam Prudy Feeler(XANAX) 0.5 MG tablet Take 0.25 mg by mouth daily as needed. For anxiety    Historical Provider, MD  aspirin EC 325 MG tablet Take 325 mg by mouth daily.    Historical Provider, MD  Biotin 5000 MCG TABS Take 1 tablet by mouth daily.    Historical Provider, MD  buPROPion (WELLBUTRIN XL) 150 MG 24 hr tablet Take 150 mg by mouth daily.    Historical Provider, MD  dexlansoprazole (DEXILANT) 60 MG capsule Take 1 capsule (60 mg total) by mouth daily. 04/16/12   Joselyn ArrowKandice L Jones, NP  FLUoxetine (PROZAC) 40 MG capsule Take 40 mg by mouth daily.    Historical Provider, MD  KRILL OIL PO Take 1 capsule by mouth daily.    Historical Provider, MD  levothyroxine (SYNTHROID, LEVOTHROID) 50 MCG tablet Take 50 mcg by mouth daily.     Historical  Provider, MD  loratadine (CLARITIN) 10 MG tablet Take 10 mg by mouth daily.    Historical Provider, MD  lovastatin (MEVACOR) 20 MG tablet Take 40 mg by mouth daily.     Historical Provider, MD  metFORMIN (GLUCOPHAGE) 500 MG tablet Take 500 mg by mouth daily with breakfast.    Historical Provider, MD  Multiple Vitamin (MULTIVITAMIN WITH MINERALS) TABS Take 1 tablet by mouth daily.    Historical Provider, MD  nitroGLYCERIN (NITROSTAT) 0.4 MG SL tablet Place 1 tablet (0.4 mg total) under the tongue every 5 (five) minutes as needed for chest pain. 09/04/15   Jodelle GrossKathryn M Lawrence, NP    ranitidine (ZANTAC) 150 MG tablet Take 300 mg by mouth at bedtime.     Historical Provider, MD    Family History Family History  Problem Relation Age of Onset  . Coronary artery disease Father   . Heart disease Father   . Barrett's esophagus Father   . Hypertension Mother   . Coronary artery disease Paternal Uncle   . Coronary artery disease Cousin     Social History Social History  Substance Use Topics  . Smoking status: Former Smoker    Types: Cigarettes    Quit date: 05/20/1991  . Smokeless tobacco: Never Used     Comment: Used to smoke a pack in 1-2 weeks/ 1992  . Alcohol use 0.0 oz/week     Comment: Rare     Allergies   Codeine; Dilaudid [hydromorphone hcl]; Tape; and Vicodin [hydrocodone-acetaminophen]   Review of Systems Review of Systems  Constitutional: Negative for fever.  HENT: Negative for congestion, ear pain and sore throat.   Eyes: Positive for pain and redness. Negative for photophobia, discharge, itching and visual disturbance.  Musculoskeletal: Positive for myalgias.  Skin: Positive for wound. Negative for color change.  Neurological: Negative for weakness and numbness.   Physical Exam Updated Vital Signs BP 122/80 (BP Location: Left Arm)   Pulse 76   Temp 98.4 F (36.9 C) (Oral)   Resp 20   Ht 5\' 4"  (1.626 m)   Wt 248 lb (112.5 kg)   SpO2 97%   BMI 42.57 kg/m   Physical Exam  Constitutional: She is oriented to person, place, and time. She appears well-developed and well-nourished.  HENT:  Head: Normocephalic and atraumatic.  Eyes: EOM and lids are normal. Pupils are equal, round, and reactive to light. Lids are everted and swept, no foreign bodies found. Right eye exhibits no chemosis, no discharge, no exudate and no hordeolum. No foreign body present in the right eye. Right conjunctiva is injected. Right conjunctiva has no hemorrhage. No scleral icterus. Right eye exhibits normal extraocular motion and no nystagmus. Pupils are equal.   Neck: Normal range of motion.  Cardiovascular: Normal rate.   Pulmonary/Chest: Effort normal. No respiratory distress.  Abdominal: Soft.  Musculoskeletal: Normal range of motion.  Neurological: She is alert and oriented to person, place, and time.  Skin: Skin is warm and dry.  Left hand: 1cm linear laceration on dorsal aspect of hand. Bleeding controlled. Mild tenderness to palpation. FROM of wrist and fingers. N/V intact.   Psychiatric: She has a normal mood and affect.  Nursing note and vitals reviewed.  ED Treatments / Results  Labs (all labs ordered are listed, but only abnormal results are displayed) Labs Reviewed - No data to display  EKG  EKG Interpretation None       Radiology No results found.  Procedures .Marland Kitchen.Laceration Repair Date/Time: 05/09/2016 5:59  PM Performed by: Bethel Born Authorized by: Terance Hart MARIE   Consent:    Consent obtained:  Verbal   Consent given by:  Patient   Risks discussed:  Pain Anesthesia (see MAR for exact dosages):    Anesthesia method:  Local infiltration   Local anesthetic:  Lidocaine 2% w/o epi Laceration details:    Location:  Hand   Hand location:  L hand, dorsum Repair type:    Repair type:  Simple Pre-procedure details:    Preparation:  Patient was prepped and draped in usual sterile fashion Exploration:    Hemostasis achieved with:  Direct pressure   Wound exploration: entire depth of wound probed and visualized     Contaminated: no   Treatment:    Irrigation solution:  Sterile saline   Irrigation method:  Syringe   Visualized foreign bodies/material removed: no   Skin repair:    Repair method:  Sutures   Suture size:  4-0   Suture material:  Prolene   Suture technique:  Simple interrupted   Number of sutures:  1 Approximation:    Approximation:  Close   Vermilion border: well-aligned   Post-procedure details:    Patient tolerance of procedure:  Tolerated well, no immediate complications       DIAGNOSTIC STUDIES: Oxygen Saturation is 97% on RA, normal by my interpretation.    COORDINATION OF CARE: 6:01 PM  Discussed next steps with pt. Pt verbalized understanding and is agreeable with the plan.    Medications Ordered in ED Medications  lidocaine (XYLOCAINE) 2 % injection (not administered)  Tdap (BOOSTRIX) injection 0.5 mL (0.5 mLs Intramuscular Given 05/09/16 1832)     Initial Impression / Assessment and Plan / ED Course  I have reviewed the triage vital signs and the nursing notes.  Pertinent labs & imaging results that were available during my care of the patient were reviewed by me and considered in my medical decision making (see chart for details).  Clinical Course   50 year old female with small laceration to dorsal aspect of hand. Lac was cleaned and repaired by me. Patient tolerated well. Advised local wound care and to have stitch removed in 1 week. Tdap updated.  Patient also tells me about her eye. Seems consistent with possible corneal abrasion due to contact use but cannot exclude pink eye. Advised to not wear contacts until symptoms improve. Rx for Levofloxacin given if symptoms are not improving in the next 1-2 days. Frequent hand washing also provided. Advised follow up with PCP. Patient is NAD, non-toxic, with stable VS. Patient is informed of clinical course, understands medical decision making process, and agrees with plan. Opportunity for questions provided and all questions answered. Return precautions given.  I personally performed the services described in this documentation, which was scribed in my presence. The recorded information has been reviewed and is accurate.  Final Clinical Impressions(s) / ED Diagnoses   Final diagnoses:  Laceration of left hand without foreign body, initial encounter  Acute conjunctivitis of right eye, unspecified acute conjunctivitis type    New Prescriptions New Prescriptions   No medications on file     Bethel Born, PA-C 05/09/16 1912    Raeford Razor, MD 05/16/16 1216

## 2016-07-27 ENCOUNTER — Emergency Department (HOSPITAL_COMMUNITY)
Admission: EM | Admit: 2016-07-27 | Discharge: 2016-07-27 | Disposition: A | Discharge status: Home / Self Care | Payer: No Typology Code available for payment source | Attending: Emergency Medicine | Admitting: Emergency Medicine

## 2016-07-27 ENCOUNTER — Encounter (HOSPITAL_COMMUNITY): Payer: Self-pay

## 2016-07-27 ENCOUNTER — Emergency Department (HOSPITAL_COMMUNITY): Payer: No Typology Code available for payment source

## 2016-07-27 DIAGNOSIS — Z7982 Long term (current) use of aspirin: Secondary | ICD-10-CM | POA: Insufficient documentation

## 2016-07-27 DIAGNOSIS — E119 Type 2 diabetes mellitus without complications: Secondary | ICD-10-CM | POA: Insufficient documentation

## 2016-07-27 DIAGNOSIS — Y9301 Activity, walking, marching and hiking: Secondary | ICD-10-CM | POA: Diagnosis not present

## 2016-07-27 DIAGNOSIS — Y929 Unspecified place or not applicable: Secondary | ICD-10-CM | POA: Diagnosis not present

## 2016-07-27 DIAGNOSIS — S82851A Displaced trimalleolar fracture of right lower leg, initial encounter for closed fracture: Secondary | ICD-10-CM | POA: Diagnosis not present

## 2016-07-27 DIAGNOSIS — E039 Hypothyroidism, unspecified: Secondary | ICD-10-CM | POA: Insufficient documentation

## 2016-07-27 DIAGNOSIS — Y999 Unspecified external cause status: Secondary | ICD-10-CM | POA: Insufficient documentation

## 2016-07-27 DIAGNOSIS — S99911A Unspecified injury of right ankle, initial encounter: Secondary | ICD-10-CM | POA: Diagnosis present

## 2016-07-27 DIAGNOSIS — Z7984 Long term (current) use of oral hypoglycemic drugs: Secondary | ICD-10-CM | POA: Insufficient documentation

## 2016-07-27 DIAGNOSIS — Z87891 Personal history of nicotine dependence: Secondary | ICD-10-CM | POA: Insufficient documentation

## 2016-07-27 DIAGNOSIS — W109XXA Fall (on) (from) unspecified stairs and steps, initial encounter: Secondary | ICD-10-CM | POA: Insufficient documentation

## 2016-07-27 DIAGNOSIS — Z79899 Other long term (current) drug therapy: Secondary | ICD-10-CM | POA: Diagnosis not present

## 2016-07-27 MED ORDER — OXYCODONE-ACETAMINOPHEN 5-325 MG PO TABS: 2.0000 | ORAL_TABLET | ORAL | 0 refills | Status: DC | PRN

## 2016-07-27 MED ORDER — ONDANSETRON 8 MG PO TBDP: 8.0000 mg | ORAL_TABLET | Freq: Three times a day (TID) | ORAL | 0 refills | Status: DC | PRN

## 2016-07-27 NOTE — ED Triage Notes (Signed)
Fell and injured right ankle.  Slipped and fell when walking outside.

## 2016-07-27 NOTE — ED Provider Notes (Signed)
[facility]-EMER[facility]Y DEPT Provider Note   CSN: [id number] Arrival date & time: 07/27/16  2059     History   Chief Complaint Chief Complaint  Patient presents with  . Ankle Pain    HPI Candice Stanley is a 51 y.o. female.  HPI Patient slipped on the stairs while she was walking outside. States she heard a crack in her right ankle has had pain. States her foot looked like it was facing the wrong direction. No other injury. No numbness or weakness.   Past Medical History:  Diagnosis Date  . GERD (gastroesophageal reflux disease)   . Hypercholesteremia   . Hypothyroidism   . Type 2 diabetes mellitus [facility](HCC)     Patient Active Problem List   Diagnosis Date Noted  . Precordial pain   . Family history of early CAD   . Chest pain 09/02/2015  . Dysphagia 04/16/2012  . Odynophagia 04/16/2012  . Unspecified hypothyroidism 11/03/2009  . HYPERLIPIDEMIA 11/03/2009  . OBESITY, UNSPECIFIED 11/03/2009  . DYSTHYMIC DISORDER 11/03/2009  . Esophageal reflux 11/03/2009  . Diaphragmatic hernia 11/03/2009  . CHEST PAIN UNSPECIFIED 11/03/2009    Past Surgical History:  Procedure Laterality Date  . ABDOMINAL HYSTERECTOMY    . CARDIAC CATHETERIZATION N/A 09/10/2015   Procedure: Left Heart Cath and Coronary Angiography;  Surgeon: Corky CraftsJayadeep S Varanasi, MD;  [facility];  Service: Cardiovascular;  Laterality: N/A;  . CHOLECYSTECTOMY    . WRIST SURGERY      OB History    No data available       Home Medications    Prior to Admission medications   Medication Sig Start Date End Date Taking? Authorizing Provider  ALPRAZolam Prudy Feeler(XANAX) 0.5 MG tablet Take 0.25 mg by mouth daily as needed. For anxiety   Yes Historical Provider, MD  aspirin EC 325 MG tablet Take 325 mg by mouth daily.   Yes Historical Provider, MD  Biotin 5000 MCG TABS Take 1 tablet by mouth daily.   Yes Historical Provider, MD  dexlansoprazole (DEXILANT) 60 MG capsule Take 1 capsule (60 mg total) by mouth daily.  04/16/12  Yes Joselyn ArrowKandice L Jones, NP  FLUoxetine (PROZAC) 40 MG capsule Take 40 mg by mouth daily.   Yes Historical Provider, MD  KRILL OIL PO Take 1 capsule by mouth daily.   Yes Historical Provider, MD  levothyroxine (SYNTHROID, LEVOTHROID) 50 MCG tablet Take 50 mcg by mouth daily.    Yes Historical Provider, MD  loratadine (CLARITIN) 10 MG tablet Take 10 mg by mouth daily.   Yes Historical Provider, MD  lovastatin (MEVACOR) 20 MG tablet Take 40 mg by mouth daily.    Yes Historical Provider, MD  metFORMIN (GLUCOPHAGE) 500 MG tablet Take 500 mg by mouth daily with breakfast.   Yes Historical Provider, MD  Multiple Vitamin (MULTIVITAMIN WITH MINERALS) TABS Take 1 tablet by mouth daily.   Yes Historical Provider, MD  nitroGLYCERIN (NITROSTAT) 0.4 MG SL tablet Place 1 tablet (0.4 mg total) under the tongue every 5 (five) minutes as needed for chest pain. 09/04/15  Yes Jodelle GrossKathryn M Lawrence, NP  ranitidine (ZANTAC) 150 MG tablet Take 300 mg by mouth at bedtime.    Yes Historical Provider, MD  buPROPion (WELLBUTRIN XL) 150 MG 24 hr tablet Take 150 mg by mouth daily.    Historical Provider, MD  levofloxacin Charlean Sanfilippo(QUIXIN) 0.5 % ophthalmic solution Place 1 drop into the right eye every 2 (two) hours. While awake for 2 days. Then 1 drop every 4-8  hours for the next 5 days 05/09/16   Bethel Born, PA-C  ondansetron (ZOFRAN-ODT) 8 MG disintegrating tablet Take 1 tablet (8 mg total) by mouth every 8 (eight) hours as needed for nausea or vomiting. 07/27/16   Benjiman Core, MD  oxyCODONE-acetaminophen (PERCOCET/ROXICET) 5-325 MG tablet Take 2 tablets by mouth every 4 (four) hours as needed for severe pain. 07/27/16   Benjiman Core, MD    Family History Family History  Problem Relation Age of Onset  . Coronary artery disease Father   . Heart disease Father   . Barrett's esophagus Father   . Hypertension Mother   . Coronary artery disease Paternal Uncle   . Coronary artery disease Cousin     Social  History Social History  Substance Use Topics  . Smoking status: Former Smoker    Types: Cigarettes    Quit date: 05/20/1991  . Smokeless tobacco: Never Used     Comment: Used to smoke a pack in 1-2 weeks/ 1992  . Alcohol use 0.0 oz/week     Comment: Rare     Allergies   Codeine; Dilaudid [hydromorphone hcl]; Tape; and Vicodin [hydrocodone-acetaminophen]   Review of Systems Review of Systems  Constitutional: Negative for appetite change.  Respiratory: Negative for shortness of breath.   Cardiovascular: Negative for chest pain.  Genitourinary: Negative for dysuria.  Musculoskeletal: Positive for gait problem and joint swelling.  Neurological: Negative for weakness and numbness.     Physical Exam Updated Vital Signs BP 137/68 (BP Location: Right Arm)   Pulse 75   Temp 98 F (36.7 C) (Oral)   Resp 16   Ht 5\' 4"  (1.626 m)   Wt 240 lb (108.9 kg)   SpO2 98%   BMI 41.20 kg/m   Physical Exam  Constitutional: She appears well-developed.  Neck: Neck supple.  Cardiovascular: Normal rate.   Pulmonary/Chest: Effort normal.  Abdominal: There is no tenderness.  Musculoskeletal: She exhibits tenderness.  Tenderness to medial and lateral right ankle. NV intact over foot. Skin intact. No tenderness over head of fibula or knee.  Skin: Skin is warm.     ED Treatments / Results  Labs (all labs ordered are listed, but only abnormal results are displayed) Labs Reviewed - No data to display  EKG  EKG Interpretation None       Radiology Dg Ankle Complete Right  Result Date: 07/27/2016 CLINICAL DATA:  Right ankle pain and swelling after fall tonight. EXAM: RIGHT ANKLE - COMPLETE 3+ VIEW COMPARISON:  None. FINDINGS: Trimalleolar ankle fracture. Transverse fracture through the medial malleolus with 4 mm osseous distraction. Mildly comminuted posterior tibial tubercle fracture with 3 mm articular surface displacement oblique distal fibular fracture is mildly comminuted, displaced  at the level of the ankle mortise. There is associated soft tissue edema. Plantar calcaneal spur and Achilles tendon enthesophyte. IMPRESSION: Mildly displaced trimalleolar fracture. Electronically Signed   By: Rubye Oaks M.D.   On: 07/27/2016 21:36    Procedures Procedures (including critical care time)  Medications Ordered in ED Medications - No data to display   Initial Impression / Assessment and Plan / ED Course  I have reviewed the triage vital signs and the nursing notes.  Pertinent labs & imaging results that were available during my care of the patient were reviewed by me and considered in my medical decision making (see chart for details).  Clinical Course     Patient with trimalleolar ankle fracture. Overall well-appearing needed. Splinted in the ER. Discharged home  to follow-up with orthopedic.  Final Clinical Impressions(s) / ED Diagnoses   Final diagnoses:  Trimalleolar fracture of ankle, closed, right, initial encounter    New Prescriptions Discharge Medication List as of 07/27/2016 10:21 PM    START taking these medications   Details  ondansetron (ZOFRAN-ODT) 8 MG disintegrating tablet Take 1 tablet (8 mg total) by mouth every 8 (eight) hours as needed for nausea or vomiting., Starting Wed 07/27/2016, Print    oxyCODONE-acetaminophen (PERCOCET/ROXICET) 5-325 MG tablet Take 2 tablets by mouth every 4 (four) hours as needed for severe pain., Starting Wed 07/27/2016, Print         Benjiman Core, MD 07/28/16 [phone]

## 2016-08-01 ENCOUNTER — Other Ambulatory Visit: Payer: Self-pay | Admitting: Orthopedic Surgery

## 2016-08-01 ENCOUNTER — Encounter (HOSPITAL_BASED_OUTPATIENT_CLINIC_OR_DEPARTMENT_OTHER): Payer: Self-pay | Admitting: *Deleted

## 2016-08-01 ENCOUNTER — Encounter (HOSPITAL_COMMUNITY)
Admission: RE | Admit: 2016-08-01 | Discharge: 2016-08-01 | Disposition: A | Discharge status: Home / Self Care | Payer: No Typology Code available for payment source | Source: Ambulatory Visit | Attending: Orthopedic Surgery | Admitting: Orthopedic Surgery

## 2016-08-01 DIAGNOSIS — X58XXXA Exposure to other specified factors, initial encounter: Secondary | ICD-10-CM | POA: Diagnosis not present

## 2016-08-01 DIAGNOSIS — S82851A Displaced trimalleolar fracture of right lower leg, initial encounter for closed fracture: Secondary | ICD-10-CM | POA: Diagnosis not present

## 2016-08-01 DIAGNOSIS — Z01812 Encounter for preprocedural laboratory examination: Secondary | ICD-10-CM | POA: Insufficient documentation

## 2016-08-01 LAB — BASIC METABOLIC PANEL
ANION GAP: 6 (ref 5–15)
BUN: 11 mg/dL (ref 6–20)
CHLORIDE: 106 mmol/L (ref 101–111)
CO2: 28 mmol/L (ref 22–32)
Calcium: 9.6 mg/dL (ref 8.9–10.3)
Creatinine, Ser: 0.79 mg/dL (ref 0.44–1.00)
GFR calc Af Amer: >60 mL/min (ref >60)
GLUCOSE: 102 mg/dL — AB (ref 65–99)
POTASSIUM: 4 mmol/L (ref 3.5–5.1)
SODIUM: 140 mmol/L (ref 135–145)

## 2016-08-01 MED FILL — Oxycodone w/ Acetaminophen Tab 5-325 MG: ORAL | Qty: 6 | Status: AC

## 2016-08-04 ENCOUNTER — Encounter (HOSPITAL_BASED_OUTPATIENT_CLINIC_OR_DEPARTMENT_OTHER): Payer: Self-pay | Admitting: *Deleted

## 2016-08-04 ENCOUNTER — Ambulatory Visit (HOSPITAL_BASED_OUTPATIENT_CLINIC_OR_DEPARTMENT_OTHER): Payer: Self-pay | Admitting: Certified Registered"

## 2016-08-04 ENCOUNTER — Encounter (HOSPITAL_BASED_OUTPATIENT_CLINIC_OR_DEPARTMENT_OTHER)
Admission: RE | Disposition: A | Discharge status: Home / Self Care | Payer: Self-pay | Source: Ambulatory Visit | Attending: Orthopedic Surgery

## 2016-08-04 ENCOUNTER — Ambulatory Visit (HOSPITAL_BASED_OUTPATIENT_CLINIC_OR_DEPARTMENT_OTHER)
Admission: RE | Admit: 2016-08-04 | Discharge: 2016-08-04 | Disposition: A | Discharge status: Home / Self Care | Payer: Self-pay | Source: Ambulatory Visit | Attending: Orthopedic Surgery | Admitting: Orthopedic Surgery

## 2016-08-04 DIAGNOSIS — F329 Major depressive disorder, single episode, unspecified: Secondary | ICD-10-CM | POA: Insufficient documentation

## 2016-08-04 DIAGNOSIS — E119 Type 2 diabetes mellitus without complications: Secondary | ICD-10-CM | POA: Insufficient documentation

## 2016-08-04 DIAGNOSIS — Z7982 Long term (current) use of aspirin: Secondary | ICD-10-CM | POA: Insufficient documentation

## 2016-08-04 DIAGNOSIS — K219 Gastro-esophageal reflux disease without esophagitis: Secondary | ICD-10-CM | POA: Insufficient documentation

## 2016-08-04 DIAGNOSIS — E7800 Pure hypercholesterolemia, unspecified: Secondary | ICD-10-CM | POA: Insufficient documentation

## 2016-08-04 DIAGNOSIS — Z79899 Other long term (current) drug therapy: Secondary | ICD-10-CM | POA: Insufficient documentation

## 2016-08-04 DIAGNOSIS — Z87891 Personal history of nicotine dependence: Secondary | ICD-10-CM | POA: Insufficient documentation

## 2016-08-04 DIAGNOSIS — W000XXA Fall on same level due to ice and snow, initial encounter: Secondary | ICD-10-CM | POA: Insufficient documentation

## 2016-08-04 DIAGNOSIS — Z7984 Long term (current) use of oral hypoglycemic drugs: Secondary | ICD-10-CM | POA: Insufficient documentation

## 2016-08-04 DIAGNOSIS — S82851A Displaced trimalleolar fracture of right lower leg, initial encounter for closed fracture: Secondary | ICD-10-CM | POA: Insufficient documentation

## 2016-08-04 DIAGNOSIS — Z6841 Body Mass Index (BMI) 40.0 and over, adult: Secondary | ICD-10-CM | POA: Insufficient documentation

## 2016-08-04 DIAGNOSIS — E039 Hypothyroidism, unspecified: Secondary | ICD-10-CM | POA: Insufficient documentation

## 2016-08-04 DIAGNOSIS — F419 Anxiety disorder, unspecified: Secondary | ICD-10-CM | POA: Insufficient documentation

## 2016-08-04 HISTORY — DX: Anxiety disorder, unspecified: F41.9

## 2016-08-04 HISTORY — DX: Depression, unspecified: F32.A

## 2016-08-04 HISTORY — DX: Major depressive disorder, single episode, unspecified: F32.9

## 2016-08-04 HISTORY — DX: Displaced trimalleolar fracture of right lower leg, sequela: S82.851S

## 2016-08-04 HISTORY — PX: OPEN REDUCTION INTERNAL FIXATION (ORIF) TIBIA/FIBULA FRACTURE: SHX5992

## 2016-08-04 LAB — GLUCOSE, CAPILLARY
GLUCOSE-CAPILLARY: 108 mg/dL — AB (ref 65–99)
Glucose-Capillary: 98 mg/dL (ref 65–99)

## 2016-08-04 SURGERY — OPEN REDUCTION INTERNAL FIXATION (ORIF) TIBIA/FIBULA FRACTURE
Anesthesia: General | Site: Ankle | Laterality: Right

## 2016-08-04 MED ORDER — EPHEDRINE 5 MG/ML INJ
INTRAVENOUS | Status: AC
  Filled 2016-08-04: qty 10

## 2016-08-04 MED ORDER — HYDROMORPHONE HCL 1 MG/ML IJ SOLN: 0.2500 mg | INTRAMUSCULAR | Status: DC | PRN

## 2016-08-04 MED ORDER — SODIUM CHLORIDE 0.9 % IN NEBU
INHALATION_SOLUTION | RESPIRATORY_TRACT | Status: AC
  Filled 2016-08-04: qty 3

## 2016-08-04 MED ORDER — LACTATED RINGERS IV SOLN
INTRAVENOUS | Status: DC
Start: 2016-08-04 — End: 2016-08-04
  Administered 2016-08-04 (×3): via INTRAVENOUS
  Administered 2016-08-04: 10 mL/h via INTRAVENOUS

## 2016-08-04 MED ORDER — MIDAZOLAM HCL 2 MG/2ML IJ SOLN
INTRAMUSCULAR | Status: AC
  Filled 2016-08-04: qty 2

## 2016-08-04 MED ORDER — PROMETHAZINE HCL 25 MG/ML IJ SOLN
6.2500 mg | INTRAMUSCULAR | Status: DC | PRN
  Administered 2016-08-04: 6.25 mg via INTRAVENOUS

## 2016-08-04 MED ORDER — MIDAZOLAM HCL 2 MG/2ML IJ SOLN
1.0000 mg | INTRAMUSCULAR | Status: DC | PRN
  Administered 2016-08-04: 2 mg via INTRAVENOUS

## 2016-08-04 MED ORDER — FENTANYL CITRATE (PF) 100 MCG/2ML IJ SOLN
INTRAMUSCULAR | Status: AC
  Filled 2016-08-04: qty 2

## 2016-08-04 MED ORDER — OXYCODONE-ACETAMINOPHEN 5-325 MG PO TABS: 1.0000 | ORAL_TABLET | ORAL | 0 refills | Status: DC | PRN

## 2016-08-04 MED ORDER — BUPIVACAINE-EPINEPHRINE (PF) 0.5% -1:200000 IJ SOLN
INTRAMUSCULAR | Status: DC | PRN
  Administered 2016-08-04: 30 mL via PERINEURAL
  Administered 2016-08-04: 20 mL via PERINEURAL

## 2016-08-04 MED ORDER — DEXAMETHASONE SODIUM PHOSPHATE 10 MG/ML IJ SOLN
INTRAMUSCULAR | Status: DC | PRN
  Administered 2016-08-04: 4 mg via INTRAVENOUS

## 2016-08-04 MED ORDER — ONDANSETRON HCL 4 MG/2ML IJ SOLN
INTRAMUSCULAR | Status: DC | PRN
  Administered 2016-08-04 (×2): 4 mg via INTRAVENOUS

## 2016-08-04 MED ORDER — MIDAZOLAM HCL 2 MG/2ML IJ SOLN: 0.5000 mg | Freq: Once | INTRAMUSCULAR | Status: DC | PRN

## 2016-08-04 MED ORDER — CEFAZOLIN SODIUM-DEXTROSE 2-4 GM/100ML-% IV SOLN
2.0000 g | INTRAVENOUS | Status: AC
  Administered 2016-08-04: 2 g via INTRAVENOUS

## 2016-08-04 MED ORDER — SODIUM CHLORIDE 0.9 % IV SOLN: INTRAVENOUS | Status: DC

## 2016-08-04 MED ORDER — VANCOMYCIN HCL 500 MG IV SOLR
INTRAVENOUS | Status: DC | PRN
  Administered 2016-08-04: 500 mg via TOPICAL

## 2016-08-04 MED ORDER — PROMETHAZINE HCL 25 MG/ML IJ SOLN
INTRAMUSCULAR | Status: AC
  Filled 2016-08-04: qty 1

## 2016-08-04 MED ORDER — SUCCINYLCHOLINE CHLORIDE 20 MG/ML IJ SOLN
INTRAMUSCULAR | Status: DC | PRN
  Administered 2016-08-04: 100 mg via INTRAVENOUS

## 2016-08-04 MED ORDER — CEFAZOLIN SODIUM-DEXTROSE 2-4 GM/100ML-% IV SOLN
INTRAVENOUS | Status: AC
  Filled 2016-08-04: qty 100

## 2016-08-04 MED ORDER — FENTANYL CITRATE (PF) 100 MCG/2ML IJ SOLN
50.0000 ug | INTRAMUSCULAR | Status: DC | PRN
  Administered 2016-08-04: 50 ug via INTRAVENOUS
  Administered 2016-08-04: 100 ug via INTRAVENOUS

## 2016-08-04 MED ORDER — CHLORHEXIDINE GLUCONATE 4 % EX LIQD: 60.0000 mL | Freq: Once | CUTANEOUS | Status: DC

## 2016-08-04 MED ORDER — MEPERIDINE HCL 25 MG/ML IJ SOLN: 6.2500 mg | INTRAMUSCULAR | Status: DC | PRN

## 2016-08-04 MED ORDER — PROPOFOL 10 MG/ML IV BOLUS
INTRAVENOUS | Status: AC
  Filled 2016-08-04: qty 20

## 2016-08-04 MED ORDER — PROPOFOL 10 MG/ML IV BOLUS
INTRAVENOUS | Status: DC | PRN
  Administered 2016-08-04: 50 mg via INTRAVENOUS
  Administered 2016-08-04: 150 mg via INTRAVENOUS

## 2016-08-04 MED ORDER — SCOPOLAMINE 1 MG/3DAYS TD PT72
MEDICATED_PATCH | TRANSDERMAL | Status: AC
  Filled 2016-08-04: qty 1

## 2016-08-04 MED ORDER — LIDOCAINE HCL (CARDIAC) 20 MG/ML IV SOLN
INTRAVENOUS | Status: DC | PRN
  Administered 2016-08-04: 60 mg via INTRAVENOUS

## 2016-08-04 MED ORDER — SODIUM CHLORIDE 0.9 % IJ SOLN
INTRAMUSCULAR | Status: AC
  Filled 2016-08-04: qty 10

## 2016-08-04 MED ORDER — SCOPOLAMINE 1 MG/3DAYS TD PT72
1.0000 | MEDICATED_PATCH | Freq: Once | TRANSDERMAL | Status: DC | PRN
  Administered 2016-08-04: 1.5 mg via TRANSDERMAL

## 2016-08-04 MED ORDER — 0.9 % SODIUM CHLORIDE (POUR BTL) OPTIME
TOPICAL | Status: DC | PRN
  Administered 2016-08-04: 200 mL

## 2016-08-04 SURGICAL SUPPLY — 74 items
BAG DECANTER FOR FLEXI CONT (MISCELLANEOUS) IMPLANT
BANDAGE ESMARK 6X9 LF (GAUZE/BANDAGES/DRESSINGS) ×1 IMPLANT
BIT DRILL 2.5X2.75 QC CALB (BIT) ×2 IMPLANT
BIT DRILL 2.9 CANN QC NONSTRL (BIT) ×2 IMPLANT
BIT DRILL 3.5X5.5 QC CALB (BIT) ×2 IMPLANT
BLADE SURG 15 STRL LF DISP TIS (BLADE) ×2 IMPLANT
BLADE SURG 15 STRL SS (BLADE) ×6
BNDG CMPR 9X6 STRL LF SNTH (GAUZE/BANDAGES/DRESSINGS) ×1
BNDG COHESIVE 4X5 TAN STRL (GAUZE/BANDAGES/DRESSINGS) ×3 IMPLANT
BNDG COHESIVE 6X5 TAN STRL LF (GAUZE/BANDAGES/DRESSINGS) ×3 IMPLANT
BNDG ESMARK 6X9 LF (GAUZE/BANDAGES/DRESSINGS) ×3
CHLORAPREP W/TINT 26ML (MISCELLANEOUS) ×3 IMPLANT
COVER BACK TABLE 60X90IN (DRAPES) ×3 IMPLANT
CUFF TOURNIQUET SINGLE 34IN LL (TOURNIQUET CUFF) IMPLANT
CUFF TOURNIQUET SINGLE 44IN (TOURNIQUET CUFF) ×2 IMPLANT
DECANTER SPIKE VIAL GLASS SM (MISCELLANEOUS) IMPLANT
DRAPE EXTREMITY T 121X128X90 (DRAPE) ×3 IMPLANT
DRAPE OEC MINIVIEW 54X84 (DRAPES) ×3 IMPLANT
DRAPE U-SHAPE 47X51 STRL (DRAPES) ×3 IMPLANT
DRSG MEPITEL 4X7.2 (GAUZE/BANDAGES/DRESSINGS) ×3 IMPLANT
DRSG PAD ABDOMINAL 8X10 ST (GAUZE/BANDAGES/DRESSINGS) ×6 IMPLANT
ELECT REM PT RETURN 9FT ADLT (ELECTROSURGICAL) ×3
ELECTRODE REM PT RTRN 9FT ADLT (ELECTROSURGICAL) ×1 IMPLANT
GAUZE SPONGE 4X4 12PLY STRL (GAUZE/BANDAGES/DRESSINGS) ×3 IMPLANT
GLOVE BIO SURGEON STRL SZ8 (GLOVE) ×3 IMPLANT
GLOVE BIOGEL PI IND STRL 7.0 (GLOVE) IMPLANT
GLOVE BIOGEL PI IND STRL 8 (GLOVE) ×2 IMPLANT
GLOVE BIOGEL PI INDICATOR 7.0 (GLOVE) ×4
GLOVE BIOGEL PI INDICATOR 8 (GLOVE) ×2
GLOVE ECLIPSE 6.5 STRL STRAW (GLOVE) ×2 IMPLANT
GLOVE ECLIPSE 7.5 STRL STRAW (GLOVE) ×1 IMPLANT
GOWN STRL REUS W/ TWL LRG LVL3 (GOWN DISPOSABLE) ×1 IMPLANT
GOWN STRL REUS W/ TWL XL LVL3 (GOWN DISPOSABLE) ×2 IMPLANT
GOWN STRL REUS W/TWL LRG LVL3 (GOWN DISPOSABLE) ×3
GOWN STRL REUS W/TWL XL LVL3 (GOWN DISPOSABLE) ×3
K-WIRE ACE 1.6X6 (WIRE) ×6
KWIRE ACE 1.6X6 (WIRE) IMPLANT
NEEDLE HYPO 22GX1.5 SAFETY (NEEDLE) IMPLANT
NS IRRIG 1000ML POUR BTL (IV SOLUTION) ×3 IMPLANT
PACK BASIN DAY SURGERY FS (CUSTOM PROCEDURE TRAY) ×3 IMPLANT
PAD CAST 4YDX4 CTTN HI CHSV (CAST SUPPLIES) ×1 IMPLANT
PADDING CAST ABS 4INX4YD NS (CAST SUPPLIES)
PADDING CAST ABS COTTON 4X4 ST (CAST SUPPLIES) IMPLANT
PADDING CAST COTTON 4X4 STRL (CAST SUPPLIES) ×3
PENCIL BUTTON HOLSTER BLD 10FT (ELECTRODE) ×3 IMPLANT
PLATE ACE 100DEG 6HOLE (Plate) ×2 IMPLANT
SANITIZER HAND PURELL 535ML FO (MISCELLANEOUS) ×3 IMPLANT
SCREW ACE CAN 4.0 40M (Screw) ×4 IMPLANT
SCREW ACE CAN 4.0 44M (Screw) ×2 IMPLANT
SCREW CORTICAL 3.5MM  16MM (Screw) ×4 IMPLANT
SCREW CORTICAL 3.5MM 14MM (Screw) ×2 IMPLANT
SCREW CORTICAL 3.5MM 16MM (Screw) IMPLANT
SCREW CORTICAL 3.5MM 18MM (Screw) ×2 IMPLANT
SCREW CORTICAL 3.5MM 26MM (Screw) ×2 IMPLANT
SHEET MEDIUM DRAPE 40X70 STRL (DRAPES) ×3 IMPLANT
SLEEVE SCD COMPRESS KNEE MED (MISCELLANEOUS) ×3 IMPLANT
SPLINT FAST PLASTER 5X30 (CAST SUPPLIES) ×40
SPLINT PLASTER CAST FAST 5X30 (CAST SUPPLIES) ×20 IMPLANT
SPONGE LAP 18X18 X RAY DECT (DISPOSABLE) ×3 IMPLANT
STOCKINETTE 6  STRL (DRAPES) ×2
STOCKINETTE 6 STRL (DRAPES) ×1 IMPLANT
SUCTION FRAZIER HANDLE 10FR (MISCELLANEOUS) ×2
SUCTION TUBE FRAZIER 10FR DISP (MISCELLANEOUS) ×1 IMPLANT
SUT ETHILON 3 0 PS 1 (SUTURE) ×5 IMPLANT
SUT MNCRL AB 3-0 PS2 18 (SUTURE) ×2 IMPLANT
SUT VIC AB 0 SH 27 (SUTURE) IMPLANT
SUT VIC AB 2-0 SH 27 (SUTURE) ×3
SUT VIC AB 2-0 SH 27XBRD (SUTURE) IMPLANT
SYR BULB 3OZ (MISCELLANEOUS) ×3 IMPLANT
SYR CONTROL 10ML LL (SYRINGE) ×1 IMPLANT
TOWEL OR 17X24 6PK STRL BLUE (TOWEL DISPOSABLE) ×6 IMPLANT
TUBE CONNECTING 20'X1/4 (TUBING) ×1
TUBE CONNECTING 20X1/4 (TUBING) ×2 IMPLANT
UNDERPAD 30X30 (UNDERPADS AND DIAPERS) ×3 IMPLANT

## 2016-08-04 NOTE — Anesthesia Procedure Notes (Signed)
Procedure Name: Intubation Date/Time: 08/04/2016 3:18 PM Performed by: Ailsa Mireles D Pre-anesthesia Checklist: Patient identified, Emergency Drugs available, Suction available and Patient being monitored Patient Re-evaluated:Patient Re-evaluated prior to inductionOxygen Delivery Method: Circle system utilized Preoxygenation: Pre-oxygenation with 100% oxygen Intubation Type: IV induction, Rapid sequence and Cricoid Pressure applied Ventilation: Mask ventilation without difficulty Laryngoscope Size: Mac and 3 Grade View: Grade I Tube type: Oral Tube size: 7.0 mm Number of attempts: 1 Airway Equipment and Method: Stylet and Oral airway Placement Confirmation: ETT inserted through vocal cords under direct vision,  positive ETCO2 and breath sounds checked- equal and bilateral Secured at: 21 cm Tube secured with: Tape Dental Injury: Teeth and Oropharynx as per pre-operative assessment

## 2016-08-04 NOTE — Anesthesia Procedure Notes (Signed)
Anesthesia Regional Block:  Adductor canal block  Pre-Anesthetic Checklist: ,, timeout performed, Correct Patient, Correct Site, Correct Laterality, Correct Procedure, Correct Position, site marked, Risks and benefits discussed,  Surgical consent,  Pre-op evaluation,  At surgeon's request and post-op pain management  Laterality: Right and Lower  Prep: chloraprep       Needles:  Injection technique: Single-shot  Needle Type: Echogenic Needle     Needle Length: 9cm 9 cm Needle Gauge: 22 and 22 G    Additional Needles:  Procedures: ultrasound guided (picture in chart) Adductor canal block Narrative:  Start time: 08/04/2016 2:50 PM End time: 08/04/2016 2:56 PM Injection made incrementally with aspirations every 5 mL.  Performed by: Personally  Anesthesiologist: Jean RosenthalJACKSON, Normon Pettijohn  Additional Notes: Pt identified in Holding room.  Monitors applied. Working IV access confirmed. Sterile prep R thigh.  #22ga ECHOgenic needle into adductor canal with US guidance.  20cc 0.5% Bupivacaine with 1:200k epi injected incrementally after negative test dose.  Patient asymptomatic, VSS, no heme aspirated, tolerated well.  Sandford Craze Payne Garske, MD

## 2016-08-04 NOTE — Anesthesia Procedure Notes (Addendum)
Anesthesia Regional Block:  Popliteal block  Pre-Anesthetic Checklist: ,, timeout performed, Correct Patient, Correct Site, Correct Laterality, Correct Procedure, Correct Position, site marked, Risks and benefits discussed,  Surgical consent,  Pre-op evaluation,  At surgeon's request and post-op pain management  Laterality: Right and Lower  Prep: chloraprep       Needles:  Injection technique: Single-shot  Needle Type: Stimulator Needle - 80     Needle Length: 9cm 9 cm Needle Gauge: 21 and 21 G    Additional Needles:  Procedures: nerve stimulator Popliteal block  Nerve Stimulator or Paresthesia:  Response: toe dorsiflexion twitch, 0.4 mA, 0.1 ms,  Response: toe abduction twitch, 0.4 mA, 0.1 ms,   Additional Responses:   Narrative:  Start time: 08/04/2016 2:40 PM End time: 08/04/2016 2:47 PM Injection made incrementally with aspirations every 5 mL.  Performed by: Personally  Anesthesiologist: Jean RosenthalJACKSON, Loany Neuroth  Additional Notes: Pt identified in Holding room.  Monitors applied. Working IV access confirmed. Sterile prep R lateral knee.  #22ga PNS to toe dorsiflexion and abduction twitches at 0.40 mA threshold.  30cc 0.5% Bupivacaine with 1:200k epi injected incrementally after negative test dose.  Patient asymptomatic, VSS, no heme aspirated, tolerated well.  Sandford Craze Danish Ruffins, MD

## 2016-08-04 NOTE — Brief Op Note (Signed)
08/04/2016  4:27 PM  PATIENT:  Candice Stanley  51 y.o. female  PRE-OPERATIVE DIAGNOSIS:  Right ankle trimalleolar fracture  POST-OPERATIVE DIAGNOSIS:  Right ankle trimalleolar fracture  Procedure(s): 1.  Open treatment of right ankle trimalleolar fracture with internal fixation and fixation of the posterior lip 2.  Stress exam of right ankle under fluoro 3.  AP,m ortise and lateral xrays of the right ankle  SURGEON:  Toni ArthursJohn Breyonna Nault, MD  ASSISTANT: n/a  ANESTHESIA:   General, regional  EBL:  minimal   TOURNIQUET:  43 min at 250 mm Hg  COMPLICATIONS:  None apparent  DISPOSITION:  Extubated, awake and stable to recovery.  DICTATION ID:  [id number]

## 2016-08-04 NOTE — H&P (Signed)
Candice Stanley is an 51 y.o. female.   Chief Complaint: right ankle fracture HPI: 51 y/o female with right ankle trimal fracture after a fall in the snow last week.  She presents now for ORIF of this unstable and displaced fracture.  Past Medical History:  Diagnosis Date  . Anxiety   . Depression   . GERD (gastroesophageal reflux disease)   . Hypercholesteremia   . Hypothyroidism   . Trimalleolar fracture of ankle, closed, right, sequela   . Type 2 diabetes mellitus (HCC)     Past Surgical History:  Procedure Laterality Date  . ABDOMINAL HYSTERECTOMY    . CARDIAC CATHETERIZATION N/A 09/10/2015   Procedure: Left Heart Cath and Coronary Angiography;  Surgeon: Corky Crafts, MD;  Location: [facility] INVASIVE CV LAB;  Service: Cardiovascular;  Laterality: N/A;  . CHOLECYSTECTOMY    . WRIST SURGERY      Family History  Problem Relation Age of Onset  . Coronary artery disease Father   . Heart disease Father   . Barrett's esophagus Father   . Hypertension Mother   . Coronary artery disease Paternal Uncle   . Coronary artery disease Cousin    Social History:  reports that she quit smoking about 25 years ago. Her smoking use included Cigarettes. She has never used smokeless tobacco. She reports that she drinks alcohol. She reports that she does not use drugs.  Allergies:  Allergies  Allergen Reactions  . Codeine Nausea And Vomiting  . Dilaudid [Hydromorphone Hcl]     Drop in blood pressure  . Tape Swelling and Other (See Comments)    Rash(paper tape)  . Vicodin [Hydrocodone-Acetaminophen] Nausea And Vomiting    Medications Prior to Admission  Medication Sig Dispense Refill  . ALPRAZolam (XANAX) 0.5 MG tablet Take 0.25 mg by mouth daily as needed. For anxiety    . aspirin EC 325 MG tablet Take 325 mg by mouth daily.    . Biotin 5000 MCG TABS Take 1 tablet by mouth daily.    Marland Kitchen dexlansoprazole (DEXILANT) 60 MG capsule Take 1 capsule (60 mg total) by mouth daily. 31 capsule 2  .  FLUoxetine (PROZAC) 40 MG capsule Take 40 mg by mouth daily.    Marland Kitchen KRILL OIL PO Take 1 capsule by mouth daily.    Marland Kitchen levothyroxine (SYNTHROID, LEVOTHROID) 50 MCG tablet Take 50 mcg by mouth daily.     Marland Kitchen loratadine (CLARITIN) 10 MG tablet Take 10 mg by mouth daily.    Marland Kitchen lovastatin (MEVACOR) 20 MG tablet Take 40 mg by mouth daily.     . metFORMIN (GLUCOPHAGE) 500 MG tablet Take 500 mg by mouth daily with breakfast.    . Multiple Vitamin (MULTIVITAMIN WITH MINERALS) TABS Take 1 tablet by mouth daily.    Marland Kitchen oxyCODONE-acetaminophen (PERCOCET/ROXICET) 5-325 MG tablet Take by mouth every 4 (four) hours as needed for severe pain.    . ranitidine (ZANTAC) 150 MG tablet Take 300 mg by mouth at bedtime.     . nitroGLYCERIN (NITROSTAT) 0.4 MG SL tablet Place 1 tablet (0.4 mg total) under the tongue every 5 (five) minutes as needed for chest pain. 25 tablet 3  . ondansetron (ZOFRAN-ODT) 8 MG disintegrating tablet Take 1 tablet (8 mg total) by mouth every 8 (eight) hours as needed for nausea or vomiting. 8 tablet 0    Results for orders placed or performed during the hospital encounter of 08/04/16 (from the past 48 hour(s))  Glucose, capillary     Status:  None   Collection Time: 08/04/16  1:46 PM  Result Value Ref Range   Glucose-Capillary 98 65 - 99 mg/dL   No results found.  ROS  No recent f/c/n/v/wt loss  Blood pressure 134/64, pulse 67, temperature 98.2 F (36.8 C), temperature source Oral, resp. rate 20, height 5\' 4"  (1.626 m), weight 116.6 kg (257 lb), SpO2 97 %. Physical Exam  [age] wd female in nad.  A and O x 4.  Mood anda ffect nomral.  EOMI.  resp unlabored.  R ankle splinted.  NVI.  No lymphadenopathy  Assessment/Plan R ankle trimal fracture - to OR for ORIF.  The risks and benefits of the alternative treatment options have been discussed in detail.  The patient wishes to proceed with surgery and specifically understands risks of bleeding, infection, nerve damage, blood clots, need for additional  surgery, amputation and death.   Toni ArthursHEWITT, Dannia Snook, MD 08/04/2016, 2:30 PM

## 2016-08-04 NOTE — Progress Notes (Signed)
Assisted Dr. Carswell Jackson with right, ultrasound guided, popliteal/saphenous, adductor canal block. Side rails up, monitors on throughout procedure. See vital signs in flow sheet. Tolerated Procedure well. 

## 2016-08-04 NOTE — Discharge Instructions (Addendum)
Toni Arthurs, MD [facility]  Please read the following information regarding your care after surgery.  Medications  You only need a prescription for the narcotic pain medicine (ex. oxycodone, Percocet, Norco).  All of the other medicines listed below are available over the counter. X Percocet as prescribed for severe pain X Aleve 2 pills twice a day for 3 days after surgery  Narcotic pain medicine (ex. oxycodone, Percocet, Vicodin) will cause constipation.  To prevent this problem, take the following medicines while you are taking any pain medicine. X docusate sodium (Colace) 100 mg twice a day X senna (Senokot) 2 tablets twice a day  X To help prevent blood clots, take a baby aspirin (81 mg) twice a day for two weeks after surgery.  You should also get up every hour while you are awake to move around.    Weight Bearing ? Bear weight when you are able on your operated leg or foot. ? Bear weight only on the heel of your operated foot in the post-op shoe. X Do not bear any weight on the operated leg or foot.  Cast / Splint / Dressing X Keep your splint or cast clean and dry.  Dont put anything (coat hanger, pencil, etc) down inside of it.  If it gets damp, use a hair dryer on the cool setting to dry it.  If it gets soaked, call the office to schedule an appointment for a cast change. ? Remove your dressing 3 days after surgery and cover the incisions with dry dressings.    After your dressing, cast or splint is removed; you may shower, but do not soak or scrub the wound.  Allow the water to run over it, and then gently pat it dry.  Swelling It is normal for you to have swelling where you had surgery.  To reduce swelling and pain, keep your toes above your nose for at least 3 days after surgery.  It may be necessary to keep your foot or leg elevated for several weeks.  If it hurts, it should be elevated.  Follow Up Call my office at [phone] when you are discharged from  the hospital or surgery center to schedule an appointment to be seen two weeks after surgery.  Call my office at [phone] if you develop a fever >101.5 F, nausea, vomiting, bleeding from the surgical site or severe pain.        Regional Anesthesia Blocks  1. Numbness or the inability to move the "blocked" extremity may last from 3-48 hours after placement. The length of time depends on the medication injected and your individual response to the medication. If the numbness is not going away after 48 hours, call your surgeon.  2. The extremity that is blocked will need to be protected until the numbness is gone and the  Strength has returned. Because you cannot feel it, you will need to take extra care to avoid injury. Because it may be weak, you may have difficulty moving it or using it. You may not know what position it is in without looking at it while the block is in effect.  3. For blocks in the legs and feet, returning to weight bearing and walking needs to be done carefully. You will need to wait until the numbness is entirely gone and the strength has returned. You should be able to move your leg and foot normally before you try and bear weight or walk. You will need someone to be with you  when you first try to ensure you do not fall and possibly risk injury.  4. Bruising and tenderness at the needle site are common side effects and will resolve in a few days.  5. Persistent numbness or new problems with movement should be communicated to the surgeon or the [facility] [phone])/ [facility] [phone]).      Post Anesthesia Home Care Instructions  Activity: Get plenty of rest for the remainder of the day. A responsible adult should stay with you for 24 hours following the procedure.  For the next 24 hours, DO NOT: -Drive a car -Advertising copywriterperate machinery -Drink alcoholic beverages -Take any medication unless instructed by your physician -Make any  legal decisions or sign important papers.  Meals: Start with liquid foods such as gelatin or soup. Progress to regular foods as tolerated. Avoid greasy, spicy, heavy foods. If nausea and/or vomiting occur, drink only clear liquids until the nausea and/or vomiting subsides. Call your physician if vomiting continues.  Special Instructions/Symptoms: Your throat may feel dry or sore from the anesthesia or the breathing tube placed in your throat during surgery. If this causes discomfort, gargle with warm salt water. The discomfort should disappear within 24 hours.  If you had a scopolamine patch placed behind your ear for the management of post- operative nausea and/or vomiting:  1. The medication in the patch is effective for 72 hours, after which it should be removed.  Wrap patch in a tissue and discard in the trash. Wash hands thoroughly with soap and water. 2. You may remove the patch earlier than 72 hours if you experience unpleasant side effects which may include dry mouth, dizziness or visual disturbances. 3. Avoid touching the patch. Wash your hands with soap and water after contact with the patch.

## 2016-08-04 NOTE — Transfer of Care (Signed)
Immediate Anesthesia Transfer of Care Note  Patient: Candice Stanley  Procedure(s) Performed: Procedure(s): OPEN REDUCTION INTERNAL FIXATION (ORIF) RIGHT TRIMALLEOLAR FRACTURE WITH SYNDESMOSIS (Right)  Patient Location: PACU  Anesthesia Type:GA combined with regional for post-op pain  Level of Consciousness: sedated  Airway & Oxygen Therapy: Patient Spontanous Breathing and Patient connected to face mask oxygen  Post-op Assessment: Report given to RN and Post -op Vital signs reviewed and stable  Post vital signs: Reviewed and stable  Last Vitals:  Vitals:   08/04/16 1500 08/04/16 1633  BP: 127/69 122/68  Pulse: 66 79  Resp: 17   Temp:      Last Pain:  Vitals:   08/04/16 1335  TempSrc: Oral  PainSc: 5       Patients Stated Pain Goal: 2 (08/01/16 1208)  Complications: No apparent anesthesia complications

## 2016-08-04 NOTE — Anesthesia Preprocedure Evaluation (Addendum)
Anesthesia Evaluation  Patient identified by MRN, date of birth, ID band Patient awake    Reviewed: Allergy & Precautions, NPO status , Patient's Chart, lab work & pertinent test results  History of Anesthesia Complications Negative for: history of anesthetic complications  Airway Mallampati: I  TM Distance: >3 FB Neck ROM: Full    Dental  (+) Dental Advisory Given, Chipped   Pulmonary former smoker,    breath sounds clear to auscultation       Cardiovascular (-) anginanegative cardio ROS   Rhythm:Regular Rate:Normal  '17 cath: no coronary disease, normal LVF   Neuro/Psych Anxiety Depression negative neurological ROS     GI/Hepatic Neg liver ROS, GERD  Medicated and Controlled,  Endo/Other  diabetes (glu 98), Oral Hypoglycemic AgentsHypothyroidism Morbid obesity  Renal/GU negative Renal ROS     Musculoskeletal  (+) Arthritis ,   Abdominal (+) + obese,   Peds  Hematology negative hematology ROS (+)   Anesthesia Other Findings   Reproductive/Obstetrics                            Anesthesia Physical Anesthesia Plan  ASA: II  Anesthesia Plan: General   Post-op Pain Management: GA combined w/ Regional for post-op pain   Induction: Intravenous  Airway Management Planned: LMA  Additional Equipment:   Intra-op Plan:   Post-operative Plan:   Informed Consent: I have reviewed the patients History and Physical, chart, labs and discussed the procedure including the risks, benefits and alternatives for the proposed anesthesia with the patient or authorized representative who has indicated his/her understanding and acceptance.   Dental advisory given  Plan Discussed with: CRNA and Surgeon  Anesthesia Plan Comments: (Plan routine monitors, GA- LMA OK, popliteal and adductor canal blocks for post op analgesia)        Anesthesia Quick Evaluation

## 2016-08-05 ENCOUNTER — Encounter (HOSPITAL_BASED_OUTPATIENT_CLINIC_OR_DEPARTMENT_OTHER): Payer: Self-pay | Admitting: Orthopedic Surgery

## 2016-08-05 NOTE — Anesthesia Postprocedure Evaluation (Addendum)
Anesthesia Post Note  Patient: Candice SaucerDawn B Benyo  Procedure(s) Performed: Procedure(s) (LRB): OPEN REDUCTION INTERNAL FIXATION (ORIF) RIGHT TRIMALLEOLAR FRACTURE (Right)  Patient location during evaluation: PACU Anesthesia Type: General and Regional Level of consciousness: awake and alert Pain management: pain level controlled Vital Signs Assessment: post-procedure vital signs reviewed and stable Respiratory status: spontaneous breathing, nonlabored ventilation and respiratory function stable Cardiovascular status: blood pressure returned to baseline and stable : nausea improved. Anesthetic complications: no Comments: Pt eval in PACU       Last Vitals:  Vitals:   08/04/16 1800 08/04/16 1830  BP: 131/72 (!) 150/75  Pulse: 74 72  Resp: 17 18  Temp:  36.6 C    Last Pain:  Vitals:   08/05/16 1015  TempSrc:   PainSc: 2                  Travius Crochet,E. Summer Mccolgan

## 2016-08-07 NOTE — Op Note (Signed)
NAME:  BROOKS, STOTZ                      ACCOUNT NO.:  MEDICAL RECORD NO.:  [id number]  LOCATION:                                 FACILITY:  PHYSICIAN:  Toni Arthurs, MD        DATE OF BIRTH:  16-Jun-1966  DATE OF PROCEDURE:  08/04/2016 DATE OF DISCHARGE:                              OPERATIVE REPORT   PREOPERATIVE DIAGNOSIS:  Right ankle trimalleolar fracture.  POSTOPERATIVE DIAGNOSIS:  Right ankle trimalleolar fracture.  PROCEDURES: 1. Open treatment of right ankle trimalleolar fracture with internal     fixation and fixation of the posterior lip. 2. Stress examination of the right ankle under fluoroscopy. 3. AP, mortise, and lateral radiographs of the right ankle.  SURGEON:  Toni Arthurs, MD.  ANESTHESIA:  General, regional.  ESTIMATED BLOOD LOSS:  Minimal.  TOURNIQUET TIME:  43 minutes at 250 mmHg.  COMPLICATIONS:  None apparent.  DISPOSITION:  Extubated, awake, and stable to recovery.  INDICATIONS FOR PROCEDURE:  The patient is a 51 year old woman who fractured her right ankle last week in the snow.  Radiographs show a displaced trimalleolar fracture.  She presents now for operative treatment of this fracture.  She understands the risks and benefits of the alternative treatment options and elects surgical treatment.  She specifically understands risks of bleeding, infection, nerve damage, blood clots, need for additional surgery, continued pain, nonunion, posttraumatic arthritis, amputation, and death.  PROCEDURE IN DETAIL:  After preoperative consent was obtained and the correct operative site was identified, the patient was brought to the operating room and placed supine on the operating table.  General anesthesia was induced.  Preoperative antibiotics were administered. Surgical time-out was taken.  The right lower extremity was prepped and draped in standard sterile fashion with tourniquet around the thigh. Extremity was exsanguinated and the tourniquet was  inflated to 250 mmHg. A longitudinal incision was made over the lateral malleolus.  Sharp dissection was carried down through the skin and subcutaneous tissue. The fracture site was identified.  It was cleared of all hematoma. Devitalized fragments of bone were removed.  The fracture site was reduced and held with a tenaculum.  Lateral radiograph confirmed appropriate reduction of the lateral malleolus fracture.  A lag screw was inserted from anterior to posterior and was noted to compress the fracture site appropriately.  A 6-hole one-third tubular plate was then selected and contoured to fit the lateral malleolus.  It was fixed distally with 2 unicortical screws and proximally with 3 bicortical screws.  All hardware was from the [employer] small frag set.  The posterior malleolus fracture was then reduced with a pointed tenaculum.  A guide pin was inserted through a small stab incision anteriorly and was advanced across the fracture site.  The guide pin was overdrilled and a partially-threaded 4.0 cannulated screw was inserted.  It was noted to compress the fracture site appropriately.  Attention was then turned to the medial aspect of the ankle, where longitudinal incision was made.  Dissection was carried down through skin and subcutaneous tissue.  The fracture site was identified.  It was cleaned of all hematoma and periosteum.  The fracture  was reduced and held with a tenaculum.  Two K-wires were inserted across the fracture site.  Radiographs showed appropriate alignment of the guide pins.  They were overdrilled and 4 mm x 40 mm partially threaded cannulated screws were inserted.  Both were noted to have adequate purchase.  Guide pins were removed.  Final AP, mortise, and lateral radiographs confirmed appropriate reduction of all of the fracture sites and appropriate position and length of all hardware.  Stress examination was then performed under live fluoroscopy.  No widening of the  ankle mortise or syndesmosis were noted.  Wounds were irrigated copiously.  Vancomycin powder was sprinkled in the wounds.  They were closed then with Vicryl, Monocryl, and nylon.  Sterile dressings were applied followed by well- padded short-leg splint.  Tourniquet was released after application of the dressings at 43 minutes.  The patient was awakened from anesthesia and transported to the recovery room in stable condition.  FOLLOWUP PLAN:  The patient will be nonweightbearing on the right lower extremity.  She will follow up with me in the office in 2 weeks for suture removal and conversion to a short-leg cast.  RADIOGRAPHS:  AP mortise and lateral radiographs of the right ankle were obtained intraoperatively.  These show interval reduction and fixation of the trimalleolar fracture.  Hardwares were appropriately positioned and of the appropriate lengths.  No other acute injuries are noted.     Toni ArthursJohn Lalla Laham, MD     JH/MEDQ  D:  08/04/2016  T:  08/05/2016  Job:  [id number]

## 2016-11-02 ENCOUNTER — Encounter (HOSPITAL_COMMUNITY): Payer: Self-pay | Admitting: Emergency Medicine

## 2016-11-02 ENCOUNTER — Emergency Department (HOSPITAL_COMMUNITY): Payer: No Typology Code available for payment source

## 2016-11-02 ENCOUNTER — Emergency Department (HOSPITAL_COMMUNITY)
Admission: EM | Admit: 2016-11-02 | Discharge: 2016-11-02 | Disposition: A | Discharge status: Home / Self Care | Payer: No Typology Code available for payment source | Attending: Emergency Medicine | Admitting: Emergency Medicine

## 2016-11-02 DIAGNOSIS — E039 Hypothyroidism, unspecified: Secondary | ICD-10-CM | POA: Insufficient documentation

## 2016-11-02 DIAGNOSIS — Z87891 Personal history of nicotine dependence: Secondary | ICD-10-CM | POA: Diagnosis not present

## 2016-11-02 DIAGNOSIS — Z79899 Other long term (current) drug therapy: Secondary | ICD-10-CM | POA: Diagnosis not present

## 2016-11-02 DIAGNOSIS — R05 Cough: Secondary | ICD-10-CM | POA: Insufficient documentation

## 2016-11-02 DIAGNOSIS — Z7984 Long term (current) use of oral hypoglycemic drugs: Secondary | ICD-10-CM | POA: Diagnosis not present

## 2016-11-02 DIAGNOSIS — Z7982 Long term (current) use of aspirin: Secondary | ICD-10-CM | POA: Diagnosis not present

## 2016-11-02 DIAGNOSIS — R531 Weakness: Secondary | ICD-10-CM | POA: Insufficient documentation

## 2016-11-02 DIAGNOSIS — E119 Type 2 diabetes mellitus without complications: Secondary | ICD-10-CM | POA: Diagnosis not present

## 2016-11-02 LAB — HEPATIC FUNCTION PANEL
ALBUMIN: 3.8 g/dL (ref 3.5–5.0)
ALT: 31 U/L (ref 14–54)
AST: 51 U/L — AB (ref 15–41)
Alkaline Phosphatase: 141 U/L — ABNORMAL HIGH (ref 38–126)
Bilirubin, Direct: 0.1 mg/dL (ref 0.1–0.5)
Indirect Bilirubin: 0.3 mg/dL (ref 0.3–0.9)
TOTAL PROTEIN: 7.6 g/dL (ref 6.5–8.1)
Total Bilirubin: 0.4 mg/dL (ref 0.3–1.2)

## 2016-11-02 LAB — CBC
HEMATOCRIT: 37.9 % (ref 36.0–46.0)
HEMOGLOBIN: 12.8 g/dL (ref 12.0–15.0)
MCH: 30 pg (ref 26.0–34.0)
MCHC: 33.8 g/dL (ref 30.0–36.0)
MCV: 89 fL (ref 78.0–100.0)
Platelets: 145 10*3/uL — ABNORMAL LOW (ref 150–400)
RBC: 4.26 MIL/uL (ref 3.87–5.11)
RDW: 13.7 % (ref 11.5–15.5)
WBC: 3.7 10*3/uL — AB (ref 4.0–10.5)

## 2016-11-02 LAB — BASIC METABOLIC PANEL
ANION GAP: 8 (ref 5–15)
BUN: 7 mg/dL (ref 6–20)
CALCIUM: 9.4 mg/dL (ref 8.9–10.3)
CO2: 24 mmol/L (ref 22–32)
Chloride: 106 mmol/L (ref 101–111)
Creatinine, Ser: 0.88 mg/dL (ref 0.44–1.00)
GLUCOSE: 158 mg/dL — AB (ref 65–99)
POTASSIUM: 3.3 mmol/L — AB (ref 3.5–5.1)
Sodium: 138 mmol/L (ref 135–145)

## 2016-11-02 LAB — URINALYSIS, ROUTINE W REFLEX MICROSCOPIC
BILIRUBIN URINE: NEGATIVE
Glucose, UA: NEGATIVE mg/dL
Hgb urine dipstick: NEGATIVE
Ketones, ur: NEGATIVE mg/dL
LEUKOCYTES UA: NEGATIVE
NITRITE: NEGATIVE
PH: 5 (ref 5.0–8.0)
Protein, ur: NEGATIVE mg/dL
Specific Gravity, Urine: 1.005 (ref 1.005–1.030)

## 2016-11-02 LAB — D-DIMER, QUANTITATIVE (NOT AT ARMC): D DIMER QUANT: 1.9 ug{FEU}/mL — AB (ref 0.00–0.50)

## 2016-11-02 LAB — TROPONIN I: Troponin I: <0.03 ng/mL (ref <0.03)

## 2016-11-02 MED ORDER — SODIUM CHLORIDE 0.9 % IV BOLUS (SEPSIS)
1000.0000 mL | Freq: Once | INTRAVENOUS | Status: AC
  Administered 2016-11-02: 1000 mL via INTRAVENOUS

## 2016-11-02 MED ORDER — IOPAMIDOL (ISOVUE-370) INJECTION 76%
100.0000 mL | Freq: Once | INTRAVENOUS | Status: AC | PRN
  Administered 2016-11-02: 100 mL via INTRAVENOUS

## 2016-11-02 NOTE — ED Provider Notes (Signed)
[facility]-EMERGENCY DEPT Provider Note   CSN: [id number] Arrival date & time: 11/02/16  1452     History   Chief Complaint Chief Complaint  Patient presents with  . Weakness    HPI Candice Stanley is a 51 y.o. female.  Patient states that she feels weak and had had diarrhea for a couple days some vomiting along with the cough. Additional history is that she took 120 mL of Robitussin extended release in a 24-hour period this is about 6 times a normal amount.   The history is provided by the patient.  Weakness  Primary symptoms include no focal weakness. This is a new problem. The current episode started 12 to 24 hours ago. The problem has not changed since onset.There was no focality noted. There has been no fever. Pertinent negatives include no shortness of breath, no chest pain and no headaches.    Past Medical History:  Diagnosis Date  . Anxiety   . Depression   . GERD (gastroesophageal reflux disease)   . Hypercholesteremia   . Hypothyroidism   . Trimalleolar fracture of ankle, closed, right, sequela   . Type 2 diabetes mellitus [facility])     Patient Active Problem List   Diagnosis Date Noted  . Precordial pain   . Family history of early CAD   . Chest pain 09/02/2015  . Dysphagia 04/16/2012  . Odynophagia 04/16/2012  . Unspecified hypothyroidism 11/03/2009  . HYPERLIPIDEMIA 11/03/2009  . OBESITY, UNSPECIFIED 11/03/2009  . DYSTHYMIC DISORDER 11/03/2009  . Esophageal reflux 11/03/2009  . Diaphragmatic hernia 11/03/2009  . CHEST PAIN UNSPECIFIED 11/03/2009    Past Surgical History:  Procedure Laterality Date  . ABDOMINAL HYSTERECTOMY    . CARDIAC CATHETERIZATION N/A 09/10/2015   Procedure: Left Heart Cath and Coronary Angiography;  Surgeon: Corky Crafts, MD;  Location: [facility] INVASIVE CV LAB;  Service: Cardiovascular;  Laterality: N/A;  . CHOLECYSTECTOMY    . OPEN REDUCTION INTERNAL FIXATION (ORIF) TIBIA/FIBULA FRACTURE Right 08/04/2016   Procedure: OPEN REDUCTION  INTERNAL FIXATION (ORIF) RIGHT TRIMALLEOLAR FRACTURE;  Surgeon: Toni Arthurs, MD;  Location: [facility];  Service: Orthopedics;  Laterality: Right;  . WRIST SURGERY      OB History    No data available       Home Medications    Prior to Admission medications   Medication Sig Start Date End Date Taking? Authorizing Provider  ALPRAZolam Prudy Feeler) 0.5 MG tablet Take 0.25 mg by mouth daily as needed. For anxiety   Yes Historical Provider, MD  aspirin EC 325 MG tablet Take 325 mg by mouth daily.   Yes Historical Provider, MD  Biotin 5000 MCG TABS Take 1 tablet by mouth daily.   Yes Historical Provider, MD  dexlansoprazole (DEXILANT) 60 MG capsule Take 1 capsule (60 mg total) by mouth daily. 04/16/12  Yes Joselyn Arrow, NP  FLUoxetine (PROZAC) 40 MG capsule Take 40 mg by mouth daily.   Yes Historical Provider, MD  KRILL OIL PO Take 1 capsule by mouth daily.   Yes Historical Provider, MD  levothyroxine (SYNTHROID, LEVOTHROID) 50 MCG tablet Take 50 mcg by mouth daily.    Yes Historical Provider, MD  loratadine (CLARITIN) 10 MG tablet Take 10 mg by mouth daily.   Yes Historical Provider, MD  lovastatin (MEVACOR) 20 MG tablet Take 20 mg by mouth 4 (four) times daily.    Yes Historical Provider, MD  metFORMIN (GLUCOPHAGE) 500 MG tablet Take 500 mg by mouth daily with breakfast.  Yes Historical Provider, MD  Multiple Vitamin (MULTIVITAMIN WITH MINERALS) TABS Take 1 tablet by mouth daily.   Yes Historical Provider, MD  ranitidine (ZANTAC) 150 MG tablet Take 300 mg by mouth at bedtime.    Yes Historical Provider, MD  oxyCODONE-acetaminophen (PERCOCET/ROXICET) 5-325 MG tablet Take 1-2 tablets by mouth every 4 (four) hours as needed for severe pain. Patient not taking: Reported on 11/02/2016 08/04/16   Toni Arthurs, MD    Family History Family History  Problem Relation Age of Onset  . Coronary artery disease Father   . Heart disease Father   . Barrett's esophagus Father   .  Hypertension Mother   . Coronary artery disease Paternal Uncle   . Coronary artery disease Cousin     Social History Social History  Substance Use Topics  . Smoking status: Former Smoker    Types: Cigarettes    Quit date: 05/20/1991  . Smokeless tobacco: Never Used     Comment: Used to smoke a pack in 1-2 weeks/ 1992  . Alcohol use 0.0 oz/week     Comment: social     Allergies   Codeine; Dilaudid [hydromorphone hcl]; Tape; and Vicodin [hydrocodone-acetaminophen]   Review of Systems Review of Systems  Constitutional: Negative for appetite change and fatigue.  HENT: Negative for congestion, ear discharge and sinus pressure.   Eyes: Negative for discharge.  Respiratory: Negative for cough and shortness of breath.   Cardiovascular: Negative for chest pain.  Gastrointestinal: Negative for abdominal pain and diarrhea.  Genitourinary: Negative for frequency and hematuria.  Musculoskeletal: Negative for back pain.  Skin: Negative for rash.  Neurological: Positive for weakness. Negative for focal weakness, seizures and headaches.  Psychiatric/Behavioral: Negative for hallucinations.     Physical Exam Updated Vital Signs BP 125/74   Pulse 75   Temp 97.8 F (36.6 C) (Oral)   Resp (!) 23   Ht  (1.626 m)   Wt 250 lb (113.4 kg)   SpO2 96%   BMI 42.91 kg/m   Physical Exam  Constitutional: She is oriented to person, place, and time. She appears well-developed.  HENT:  Head: Normocephalic.  Eyes: Conjunctivae and EOM are normal. No scleral icterus.  Neck: Neck supple. No thyromegaly present.  Cardiovascular: Normal rate and regular rhythm.  Exam reveals no gallop and no friction rub.   No murmur heard. Pulmonary/Chest: No stridor. She has no wheezes. She has no rales. She exhibits no tenderness.  Abdominal: She exhibits no distension. There is no tenderness. There is no rebound.  Musculoskeletal: Normal range of motion. She exhibits no edema.  Lymphadenopathy:    She  has no cervical adenopathy.  Neurological: She is oriented to person, place, and time. She exhibits normal muscle tone. Coordination normal.  Skin: No rash noted. No erythema.  Psychiatric: She has a normal mood and affect. Her behavior is normal.     ED Treatments / Results  Labs (all labs ordered are listed, but only abnormal results are displayed) Labs Reviewed  BASIC METABOLIC PANEL - Abnormal; Notable for the following:       Result Value   Potassium 3.3 (*)    Glucose, Bld 158 (*)    All other components within normal limits  CBC - Abnormal; Notable for the following:    WBC 3.7 (*)    Platelets 145 (*)    All other components within normal limits  HEPATIC FUNCTION PANEL - Abnormal; Notable for the following:    AST 51 (*)  Alkaline Phosphatase 141 (*)    All other components within normal limits  D-DIMER, QUANTITATIVE (NOT AT [facility]) - Abnormal; Notable for the following:    D-Dimer, Quant 1.90 (*)    All other components within normal limits  URINALYSIS, ROUTINE W REFLEX MICROSCOPIC  TROPONIN I    EKG  EKG Interpretation  Date/Time:  Wednesday November 02 2016 14:59:24 EDT Ventricular Rate:  83 PR Interval:    QRS Duration: 96 QT Interval:  399 QTC Calculation: 469 R Axis:   52 Text Interpretation:  Sinus rhythm Borderline T wave abnormalities No significant change since last tracing Confirmed by [facility] MD, Barbara Cower [phone]) on 11/02/2016 3:05:22 PM       Radiology Ct Angio Chest Pe W And/or Wo Contrast  Result Date: 11/02/2016 CLINICAL DATA:  Generalized weakness and elevated D-dimer EXAM: CT ANGIOGRAPHY CHEST WITH CONTRAST TECHNIQUE: Multidetector CT imaging of the chest was performed using the standard protocol during bolus administration of intravenous contrast. Multiplanar CT image reconstructions and MIPs were obtained to evaluate the vascular anatomy. CONTRAST:  100 mL Isovue 370. COMPARISON:  Films from earlier in the same day. FINDINGS: Cardiovascular: The  thoracic aorta shows a normal branching pattern. No aneurysmal dilatation or dissection is seen. No significant atherosclerotic changes are noted. The heart is not significantly enlarged. No significant coronary calcifications are seen. The pulmonary artery demonstrates a normal branching pattern without definitive pulmonary embolism. Mediastinum/Nodes: No hilar or mediastinal adenopathy is noted. The thoracic inlet is within normal limits with the exception of mild enlargement of the left lobe of the thyroid. A vague hypodense lesion is noted within although incompletely evaluated on this exam. Lungs/Pleura: Lungs are clear. No pleural effusion or pneumothorax. Upper Abdomen: Gallbladder has been surgically removed. The spleen is mildly prominent. Musculoskeletal: No acute bony abnormality noted. Review of the MIP images confirms the above findings. IMPRESSION: No evidence of pulmonary emboli. Prominence of the left lobe of the thyroid with vague lesion. The need for ultrasound evaluation on a nonemergent basis can be determined on a clinical basis. Mild prominence of the spleen of uncertain significance. Electronically Signed   By: Alcide Clever M.D.   On: 11/02/2016 18:33   Dg Abd Acute W/chest  Result Date: 11/02/2016 CLINICAL DATA:  Sudden generalized weakness over the last couple of hours, diffuse numbness EXAM: DG ABDOMEN ACUTE W/ 1V CHEST COMPARISON:  Chest x-ray of 09/02/2015 FINDINGS: The lungs are clear. Mediastinal and hilar contours are unremarkable. The heart is within normal limits in size. No bony abnormality is seen. Supine and erect views of the abdomen show no bowel obstruction. No free air is seen. Surgical clips are present in the right upper quadrant from prior cholecystectomy. Mild splenomegaly cannot be excluded. No opaque calculi are seen. A probable phlebolith is present low in the left pelvis. IMPRESSION: 1. No bowel obstruction.  No free air. 2. Cannot exclude mild splenomegaly by  plain film. 3. No active lung disease. Electronically Signed   By: Dwyane Dee M.D.   On: 11/02/2016 16:20    Procedures Procedures (including critical care time)  Medications Ordered in ED Medications  sodium chloride 0.9 % bolus 1,000 mL (0 mLs Intravenous Stopped 11/02/16 1743)  iopamidol (ISOVUE-370) 76 % injection 100 mL (100 mLs Intravenous Contrast Given 11/02/16 1813)     Initial Impression / Assessment and Plan / ED Course  I have reviewed the triage vital signs and the nursing notes.  Pertinent labs & imaging results that were available during  my care of the patient were reviewed by me and considered in my medical decision making (see chart for details).     Labs unremarkable. Patient's weakness is probably related to her viral syndrome along with taking too much Robitussin. Poison control was consult would and they stated patient should just stay hydrated. Patient will follow-up with her PCP next week if any problems  Final Clinical Impressions(s) / ED Diagnoses   Final diagnoses:  Weakness    New Prescriptions New Prescriptions   No medications on file     Bethann Berkshire, MD 11/02/16 1930

## 2016-11-02 NOTE — ED Notes (Signed)
Patient transported to CT 

## 2016-11-02 NOTE — ED Triage Notes (Signed)
Pt called ems due to sudden gen weakness 2 hrs ago. Pt states she is numb everywhere and c/o dry mouth. cbg 111. A/o

## 2016-11-02 NOTE — Discharge Instructions (Signed)
Drink plenty of fluids and follow-up with your family doctor next week if any problems °

## 2016-11-02 NOTE — ED Notes (Signed)
Ct aware pt has spoken with edp and iv to r ac intact

## 2016-11-25 ENCOUNTER — Emergency Department (HOSPITAL_COMMUNITY)
Admission: EM | Admit: 2016-11-25 | Discharge: 2016-11-25 | Disposition: A | Discharge status: Home / Self Care | Payer: No Typology Code available for payment source | Attending: Emergency Medicine | Admitting: Emergency Medicine

## 2016-11-25 ENCOUNTER — Encounter (HOSPITAL_COMMUNITY): Payer: Self-pay

## 2016-11-25 DIAGNOSIS — Z87891 Personal history of nicotine dependence: Secondary | ICD-10-CM | POA: Diagnosis not present

## 2016-11-25 DIAGNOSIS — Z79899 Other long term (current) drug therapy: Secondary | ICD-10-CM | POA: Insufficient documentation

## 2016-11-25 DIAGNOSIS — Z7984 Long term (current) use of oral hypoglycemic drugs: Secondary | ICD-10-CM | POA: Diagnosis not present

## 2016-11-25 DIAGNOSIS — Z7982 Long term (current) use of aspirin: Secondary | ICD-10-CM | POA: Diagnosis not present

## 2016-11-25 DIAGNOSIS — E039 Hypothyroidism, unspecified: Secondary | ICD-10-CM | POA: Insufficient documentation

## 2016-11-25 DIAGNOSIS — E119 Type 2 diabetes mellitus without complications: Secondary | ICD-10-CM | POA: Diagnosis not present

## 2016-11-25 DIAGNOSIS — J42 Unspecified chronic bronchitis: Secondary | ICD-10-CM | POA: Diagnosis not present

## 2016-11-25 DIAGNOSIS — R05 Cough: Secondary | ICD-10-CM | POA: Diagnosis present

## 2016-11-25 DIAGNOSIS — R110 Nausea: Secondary | ICD-10-CM | POA: Diagnosis not present

## 2016-11-25 LAB — CBC WITH DIFFERENTIAL/PLATELET
BASOS PCT: 1 %
Basophils Absolute: 0 10*3/uL (ref 0.0–0.1)
EOS ABS: 0 10*3/uL (ref 0.0–0.7)
EOS PCT: 1 %
HCT: 36.3 % (ref 36.0–46.0)
HEMOGLOBIN: 11.8 g/dL — AB (ref 12.0–15.0)
Lymphocytes Relative: 46 %
Lymphs Abs: 1.8 10*3/uL (ref 0.7–4.0)
MCH: 29.4 pg (ref 26.0–34.0)
MCHC: 32.5 g/dL (ref 30.0–36.0)
MCV: 90.3 fL (ref 78.0–100.0)
MONOS PCT: 5 %
Monocytes Absolute: 0.2 10*3/uL (ref 0.1–1.0)
NEUTROS PCT: 47 %
Neutro Abs: 1.8 10*3/uL (ref 1.7–7.7)
PLATELETS: 150 10*3/uL (ref 150–400)
RBC: 4.02 MIL/uL (ref 3.87–5.11)
RDW: 15.2 % (ref 11.5–15.5)
WBC: 3.9 10*3/uL — ABNORMAL LOW (ref 4.0–10.5)

## 2016-11-25 LAB — COMPREHENSIVE METABOLIC PANEL
ALBUMIN: 3 g/dL — AB (ref 3.5–5.0)
ALK PHOS: 171 U/L — AB (ref 38–126)
ALT: 35 U/L (ref 14–54)
ANION GAP: 7 (ref 5–15)
AST: 75 U/L — ABNORMAL HIGH (ref 15–41)
BUN: 10 mg/dL (ref 6–20)
CALCIUM: 9.1 mg/dL (ref 8.9–10.3)
CHLORIDE: 105 mmol/L (ref 101–111)
CO2: 25 mmol/L (ref 22–32)
Creatinine, Ser: 0.64 mg/dL (ref 0.44–1.00)
GFR calc non Af Amer: >60 mL/min (ref >60)
GLUCOSE: 141 mg/dL — AB (ref 65–99)
Potassium: 3.7 mmol/L (ref 3.5–5.1)
SODIUM: 137 mmol/L (ref 135–145)
TOTAL PROTEIN: 7.1 g/dL (ref 6.5–8.1)
Total Bilirubin: 1.2 mg/dL (ref 0.3–1.2)

## 2016-11-25 LAB — URINALYSIS, ROUTINE W REFLEX MICROSCOPIC
BILIRUBIN URINE: NEGATIVE
Glucose, UA: NEGATIVE mg/dL
KETONES UR: NEGATIVE mg/dL
LEUKOCYTES UA: NEGATIVE
Nitrite: NEGATIVE
PH: 6 (ref 5.0–8.0)
PROTEIN: NEGATIVE mg/dL
Specific Gravity, Urine: 1.012 (ref 1.005–1.030)

## 2016-11-25 LAB — TROPONIN I

## 2016-11-25 MED ORDER — SODIUM CHLORIDE 0.9 % IV BOLUS (SEPSIS)
500.0000 mL | Freq: Once | INTRAVENOUS | Status: AC
  Administered 2016-11-25: 500 mL via INTRAVENOUS

## 2016-11-25 MED ORDER — ONDANSETRON 4 MG PO TBDP: ORAL_TABLET | ORAL | 0 refills | Status: DC

## 2016-11-25 MED ORDER — ONDANSETRON HCL 4 MG/2ML IJ SOLN
4.0000 mg | Freq: Once | INTRAMUSCULAR | Status: AC
  Administered 2016-11-25: 4 mg via INTRAVENOUS
  Filled 2016-11-25: qty 2

## 2016-11-25 NOTE — ED Provider Notes (Signed)
[facility]-EMERGENCY DEPT Provider Note   CSN: [id number] Arrival date & time: 11/25/16  0831  By signing my name below, I, Bing NeighborsMaurice Deon Copeland Jr., attest that this documentation has been prepared under the direction and in the presence of No att. providers found. Electronically signed: Bing NeighborsMaurice Deon Copeland Jr., ED Scribe. 11/28/16. 6:41 AM.   History   Chief Complaint Chief Complaint  Patient presents with  . Nausea    HPI Candice Stanley is a 51 y.o. female with hx of cholecystectomy, hypothyroid, hyperlipidemia, Type 2 diabetes mellitus who presents to the Emergency Department complaining of nausea with onset x2 hours. Pt states that she awoke x2 hours go with nausea and diaphoresis. She notes that she has been dealing with an illness for the past x1 month. Pt reports lightheadedness, weakness, R flank pain. She has taken Gas X and Tums with no relief. Pt denies chest pain, fever, dysuria, frequency, blood in stool, cough. Pt reports blood thinner use. Of note, pt recently had heart cath and VQ screening which was normal. Pt reports major ankle surgery x4 months ago.   The history is provided by the patient. No language interpreter was used.    Past Medical History:  Diagnosis Date  . Anxiety   . Depression   . GERD (gastroesophageal reflux disease)   . Hypercholesteremia   . Hypothyroidism   . Trimalleolar fracture of ankle, closed, right, sequela   . Type 2 diabetes mellitus [facility](HCC)     Patient Active Problem List   Diagnosis Date Noted  . Precordial pain   . Family history of early CAD   . Chest pain 09/02/2015  . Dysphagia 04/16/2012  . Odynophagia 04/16/2012  . Unspecified hypothyroidism 11/03/2009  . HYPERLIPIDEMIA 11/03/2009  . OBESITY, UNSPECIFIED 11/03/2009  . DYSTHYMIC DISORDER 11/03/2009  . Esophageal reflux 11/03/2009  . Diaphragmatic hernia 11/03/2009  . CHEST PAIN UNSPECIFIED 11/03/2009    Past Surgical History:  Procedure Laterality Date  . ABDOMINAL  HYSTERECTOMY    . CARDIAC CATHETERIZATION N/A 09/10/2015   Procedure: Left Heart Cath and Coronary Angiography;  Surgeon: Corky CraftsJayadeep S Varanasi, MD;  [facility];  Service: Cardiovascular;  Laterality: N/A;  . CHOLECYSTECTOMY    . OPEN REDUCTION INTERNAL FIXATION (ORIF) TIBIA/FIBULA FRACTURE Right 08/04/2016   Procedure: OPEN REDUCTION INTERNAL FIXATION (ORIF) RIGHT TRIMALLEOLAR FRACTURE;  Surgeon: Toni ArthursJohn Hewitt, MD;  Location: [facility];  Service: Orthopedics;  Laterality: Right;  . WRIST SURGERY      OB History    No data available       Home Medications    Prior to Admission medications   Medication Sig Start Date End Date Taking? Authorizing Provider  albuterol (PROVENTIL) (2.5 MG/3ML) 0.083% nebulizer solution Take 2.5 mg by nebulization every 6 (six) hours as needed for wheezing or shortness of breath.   Yes [provider]  ALPRAZolam Prudy Feeler(XANAX) 0.5 MG tablet Take 0.25 mg by mouth daily as needed. For anxiety   Yes [provider]  apixaban (ELIQUIS) 5 MG TABS tablet Take 5 mg by mouth 2 (two) times daily.   Yes [provider]  aspirin EC 325 MG tablet Take 325 mg by mouth daily.   Yes [provider]  Biotin 5000 MCG TABS Take 1 tablet by mouth daily.   Yes [provider]  dexlansoprazole (DEXILANT) 60 MG capsule Take 1 capsule (60 mg total) by mouth daily. 04/16/12  Yes Joselyn ArrowJones, Kandice L, NP  Dextromethorphan-Guaifenesin (DELSYM COUGH/CHEST CONGEST DM)  5-100 MG/5ML LIQD Take 5-10 mLs by mouth daily as needed (cough).   Yes [provider]  FLUoxetine (PROZAC) 40 MG capsule Take 40 mg by mouth daily.   Yes [provider]  HYDROcodone-homatropine (HYCODAN) 5-1.5 MG/5ML syrup Take 5 mLs by mouth every 6 (six) hours as needed for cough.   Yes [provider]  KRILL OIL PO Take 1 capsule by mouth daily.   Yes [provider]  levothyroxine (SYNTHROID, LEVOTHROID) 50 MCG tablet  Take 50 mcg by mouth daily.    Yes [provider]  loratadine (CLARITIN) 10 MG tablet Take 10 mg by mouth daily.   Yes [provider]  lovastatin (MEVACOR) 20 MG tablet Take 20 mg by mouth 4 (four) times daily.    Yes [provider]  metFORMIN (GLUCOPHAGE) 500 MG tablet Take 500 mg by mouth daily with breakfast.   Yes [provider]  Multiple Vitamin (MULTIVITAMIN WITH MINERALS) TABS Take 1 tablet by mouth daily.   Yes [provider]  ranitidine (ZANTAC) 150 MG tablet Take 300 mg by mouth at bedtime.    Yes [provider]  ondansetron (ZOFRAN ODT) 4 MG disintegrating tablet 4mg  ODT q4 hours prn nausea/vomit 11/25/16   Blane Ohara, MD    Family History Family History  Problem Relation Age of Onset  . Coronary artery disease Father   . Heart disease Father   . Barrett's esophagus Father   . Hypertension Mother   . Coronary artery disease Paternal Uncle   . Coronary artery disease Cousin     Social History Social History  Substance Use Topics  . Smoking status: Former Smoker    Types: Cigarettes    Quit date: 05/20/1991  . Smokeless tobacco: Never Used     Comment: Used to smoke a pack in 1-2 weeks/ 1992  . Alcohol use 0.0 oz/week     Comment: social     Allergies   Codeine; Dilaudid [hydromorphone hcl]; Tape; and Vicodin [hydrocodone-acetaminophen]   Review of Systems Review of Systems  Constitutional: Positive for diaphoresis. Negative for fever.  Respiratory: Positive for cough. Negative for shortness of breath.   Cardiovascular: Negative for chest pain and leg swelling.  Gastrointestinal: Positive for nausea. Negative for blood in stool and vomiting.  Genitourinary: Positive for flank pain (R). Negative for dysuria and frequency.  Neurological: Positive for weakness and light-headedness.  All other systems reviewed and are negative.    Physical Exam Updated Vital Signs BP 130/62 (BP Location: Left Arm)    Pulse 77   Temp 98 F (36.7 C) (Oral)   Resp 16   SpO2 95%   Physical Exam  Constitutional: She appears well-developed and well-nourished. No distress.  HENT:  Head: Normocephalic and atraumatic.  Mouth/Throat: Mucous membranes are dry.  Eyes: Conjunctivae are normal.  Neck: Neck supple.  Cardiovascular: Normal rate and regular rhythm.   No murmur heard. Pulmonary/Chest: Effort normal and breath sounds normal. No respiratory distress.  Lungs clear.   Abdominal: Soft. There is no tenderness.  Currently abdomen soft and non-tender.  Musculoskeletal: She exhibits no edema.       Right lower leg: She exhibits no edema.       Left lower leg: She exhibits no edema.  No lower extremity edema  Neurological: She is alert.  Skin: Skin is warm and dry.  Psychiatric: She has a normal mood and affect.  Nursing note and vitals reviewed.    ED Treatments / Results  DIAGNOSTIC STUDIES: Oxygen Saturation is 99% on RA, normal by my interpretation.   COORDINATION OF CARE: 6:41 AM-Discussed next steps with pt. Pt verbalized understanding and is agreeable with the plan.    Labs (all labs ordered are listed, but only abnormal results are displayed) Labs Reviewed  CBC WITH DIFFERENTIAL/PLATELET - Abnormal; Notable for the following:       Result Value   WBC 3.9 (*)    Hemoglobin 11.8 (*)    All other components within normal limits  COMPREHENSIVE METABOLIC PANEL - Abnormal; Notable for the following:    Glucose, Bld 141 (*)    Albumin 3.0 (*)    AST 75 (*)    Alkaline Phosphatase 171 (*)    All other components within normal limits  URINALYSIS, ROUTINE W REFLEX MICROSCOPIC - Abnormal; Notable for the following:    Hgb urine dipstick SMALL (*)    Bacteria, UA RARE (*)    Squamous Epithelial / LPF 0-5 (*)    All other components within normal limits  TROPONIN I    EKG  EKG Interpretation None       Radiology No results found. Ct Angio Chest Pe W And/or Wo  Contrast  Result Date: 11/02/2016 CLINICAL DATA:  Generalized weakness and elevated D-dimer EXAM: CT ANGIOGRAPHY CHEST WITH CONTRAST TECHNIQUE: Multidetector CT imaging of the chest was performed using the standard protocol during bolus administration of intravenous contrast. Multiplanar CT image reconstructions and MIPs were obtained to evaluate the vascular anatomy. CONTRAST:  100 mL Isovue 370. COMPARISON:  Films from earlier in the same day. FINDINGS: Cardiovascular: The thoracic aorta shows a normal branching pattern. No aneurysmal dilatation or dissection is seen. No significant atherosclerotic changes are noted. The heart is not significantly enlarged. No significant coronary calcifications are seen. The pulmonary artery demonstrates a normal branching pattern without definitive pulmonary embolism. Mediastinum/Nodes: No hilar or mediastinal adenopathy is noted. The thoracic inlet is within normal limits with the exception of mild enlargement of the left lobe of the thyroid. A vague hypodense lesion is noted within although incompletely evaluated on this exam. Lungs/Pleura: Lungs are clear. No pleural effusion or pneumothorax. Upper Abdomen: Gallbladder has been surgically removed. The spleen is mildly prominent. Musculoskeletal: No acute bony abnormality noted. Review of the MIP images confirms the above findings. IMPRESSION: No evidence of pulmonary emboli. Prominence of the left lobe of the thyroid with vague lesion. The need for ultrasound evaluation on a nonemergent basis can be determined on a clinical basis. Mild prominence of the spleen of uncertain significance. Electronically Signed   By: Alcide Clever M.D.   On: 11/02/2016 18:33   Dg Abd Acute W/chest  Result Date: 11/02/2016 CLINICAL DATA:  Sudden generalized weakness over the last couple of hours, diffuse numbness EXAM: DG ABDOMEN ACUTE W/ 1V CHEST COMPARISON:  Chest x-ray of 09/02/2015 FINDINGS: The lungs are clear. Mediastinal and hilar  contours are unremarkable. The heart is within normal limits in size. No bony abnormality is seen. Supine and erect views of the abdomen show no bowel obstruction. No free air is seen. Surgical clips are present in the right upper quadrant from prior cholecystectomy. Mild splenomegaly cannot be excluded. No opaque calculi are seen. A probable phlebolith is present low in the left pelvis. IMPRESSION: 1. No bowel obstruction.  No free air. 2. Cannot exclude mild splenomegaly by plain film. 3. No active lung disease. Electronically Signed   By: Dwyane Dee M.D.   On: 11/02/2016 16:20  Procedures Procedures (including critical care time)  Medications Ordered in ED Medications  sodium chloride 0.9 % bolus 500 mL (0 mLs Intravenous Stopped 11/25/16 0955)  ondansetron (ZOFRAN) injection 4 mg (4 mg Intravenous Given 11/25/16 0913)     Initial Impression / Assessment and Plan / ED Course  I have reviewed the triage vital signs and the nursing notes.  Pertinent labs & imaging results that were available during my care of the patient were reviewed by me and considered in my medical decision making (see chart for details).     Patient presents with recurrent nausea, cough, mild diaphoresis.  [facility] cardiac, troponins sent.  No sob or CP.  Sxs intermittent for 1 month.  Plan for close out patient followup.  Patient sxs improved on reassessment.  Pt had recent CXR and was told normal at outside hospital, wishes to hold on repeat.   Results and differential diagnosis were discussed with the patient/parent/guardian. Xrays were independently reviewed by myself.  Close follow up outpatient was discussed, comfortable with the plan.   Medications  sodium chloride 0.9 % bolus 500 mL (0 mLs Intravenous Stopped 11/25/16 0955)  ondansetron (ZOFRAN) injection 4 mg (4 mg Intravenous Given 11/25/16 0913)    Vitals:   11/25/16 1100 11/25/16 1103 11/25/16 1132 11/25/16 1135  BP: (!) 120/56 (!) 120/56 130/62    Pulse: 67 70 77   Resp: 20 (!) 21 16   Temp:    98 F (36.7 C)  TempSrc:    Oral  SpO2: 95% 94% 95%     Final diagnoses:  Chronic bronchitis, unspecified chronic bronchitis type (HCC)  Nausea     Final Clinical Impressions(s) / ED Diagnoses   Final diagnoses:  Chronic bronchitis, unspecified chronic bronchitis type (HCC)  Nausea    New Prescriptions Discharge Medication List as of 11/25/2016 11:00 AM    START taking these medications   Details  ondansetron (ZOFRAN ODT) 4 MG disintegrating tablet 4mg  ODT q4 hours prn nausea/vomit, Print           Blane Ohara, MD 11/28/16 [phone]

## 2016-11-25 NOTE — Discharge Instructions (Signed)
Follow up with pulmonolgist.  If you were given medicines take as directed.  If you are on coumadin or contraceptives realize their levels and effectiveness is altered by many different medicines.  If you have any reaction (rash, tongues swelling, other) to the medicines stop taking and see a physician.    If your blood pressure was elevated in the ER make sure you follow up for management with a primary doctor or return for chest pain, shortness of breath or stroke symptoms.  Please follow up as directed and return to the ER or see a physician for new or worsening symptoms.  Thank you. Vitals:   11/25/16 0847 11/25/16 0900 11/25/16 0915 11/25/16 0930  BP: 134/65 136/73  (!) 110/53  Pulse: 78 74 72 66  Resp: 20 10 18  (!) 21  SpO2: 94% 95% 95% 94%

## 2016-11-25 NOTE — ED Triage Notes (Signed)
Pt reports indigestion this morning and took gas x and tums but then became diaphoretic, nauseated, and weak.  Reports has had cold symptoms since April 25 that hasn't subsided.

## 2016-12-06 ENCOUNTER — Ambulatory Visit (INDEPENDENT_AMBULATORY_CARE_PROVIDER_SITE_OTHER): Payer: No Typology Code available for payment source | Admitting: Pulmonary Disease

## 2016-12-06 ENCOUNTER — Other Ambulatory Visit: Payer: No Typology Code available for payment source

## 2016-12-06 ENCOUNTER — Encounter: Payer: Self-pay | Admitting: Pulmonary Disease

## 2016-12-06 VITALS — BP 132/82 | HR 97 | Ht 64.0 in | Wt 248.0 lb

## 2016-12-06 DIAGNOSIS — R05 Cough: Secondary | ICD-10-CM

## 2016-12-06 DIAGNOSIS — R053 Chronic cough: Secondary | ICD-10-CM

## 2016-12-06 NOTE — Patient Instructions (Signed)
Cough could be related to sinus drip or more likely reflux  Take store brand Sudafed (eg walphed)  once daily Take chlorpheniramine 4 mg at bedtime daily for 4 weeks  Allergy testing today  Stay on DEXilant and Zantac We discussed nonpharmacological measures for reflux such as -Small portions -No eating 4 hours before bedtime -Okay to sleep on 2 pillows -Avoid foods that trigger reflux

## 2016-12-06 NOTE — Assessment & Plan Note (Signed)
Cough could be related to sinus drip or more likely reflux I have reviewed her imaging and diaphragmatic hernia doesn't appear to be significant her initial cough could've been related to post bronchitic but appears to have resolved  Take store brand Sudafed (eg walphed)  once daily Take chlorpheniramine 4 mg at bedtime daily for 4 weeks  Allergy testing today  Stay on DEXilant and Zantac We discussed nonpharmacological measures for reflux such as -Small portions -No eating 4 hours before bedtime -Okay to sleep on 2 pillows -Avoid foods that trigger reflux

## 2016-12-06 NOTE — Progress Notes (Signed)
Subjective:    Patient ID: Candice Stanley, female    DOB: [birth year], 51 y.o.   MRN: [id number]  HPI   Chief Complaint  Patient presents with  . Pulm Consult    Chronic cough that started on April 25th. Started out as a non-productive cough, deep. Over the past week, the cough has changed and became productive with clear and watery mucus.     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 She reports one to 2 episodes of acute bronchitis around Thanksgiving time every year. She had a similar episode in 2017 and recovered she then developed an acute cough in 10/2016, she initially took leftover antibiotics and then Robitussin. She may have taken too much of Robitussin one day and landed up in the emergency room with an acute reaction On 4/25. Chest x-ray did not show any active lung disease. CT angiogram was negative for pulmonary embolism. She had a positive d-dimer and ankle surgery in January 2018 She was then given a course of prednisone by her PCP and also nebulizers all of which did not help her symptoms at all. Codeine-containing cough syrup also did not help her cough. She was again evaluated in emergency room on 5/18 due to nausea and diaphoresis, acute coronary syndrome was ruled out. She states that this cough resolves spontaneously but a new kind of cough is surfaced which is now mostly dry or minimally productive of clear sputum. She has been using cough drops because this seems to be the only thing that decreases the cough. She denies postnasal drip but reports breakthrough reflux in spite of taking dexilant and Zantac 300 mg at night. She underwent esophageal dilatation 04/2012   She denies fevers, weight loss. Medication review does not reveal ACE inhibitor. She is a diabetic on metformin    Past Medical History:  Diagnosis Date  . Anxiety   . Depression   . GERD (gastroesophageal reflux disease)   . Hypercholesteremia   .  Hypothyroidism   . Trimalleolar fracture of ankle, closed, right, sequela   . Type 2 diabetes mellitus (HCC)     Past Surgical History:  Procedure Laterality Date  . ABDOMINAL HYSTERECTOMY    . CARDIAC CATHETERIZATION N/A 09/10/2015   Procedure: Left Heart Cath and Coronary Angiography;  Surgeon: Corky Crafts, MD;  Location: [facility];  Service: Cardiovascular;  Laterality: N/A;  . CHOLECYSTECTOMY    . OPEN REDUCTION INTERNAL FIXATION (ORIF) TIBIA/FIBULA FRACTURE Right 08/04/2016   Procedure: OPEN REDUCTION INTERNAL FIXATION (ORIF) RIGHT TRIMALLEOLAR FRACTURE;  Surgeon: Toni Arthurs, MD;  Location: [facility];  Service: Orthopedics;  Laterality: Right;  . WRIST SURGERY      Allergies  Allergen Reactions  . Codeine Nausea And Vomiting  . Dilaudid [Hydromorphone Hcl]     Drop in blood pressure  . Tape Swelling and Other (See Comments)    Rash(paper tape)  . Vicodin [Hydrocodone-Acetaminophen] Nausea And Vomiting  . Latex Rash    Social History   Social History  . Marital status: Married    Spouse name: N/A  . Number of children: 2  . Years of education: N/A   Occupational History  . property mgmt [employer]   Social History Main Topics  . Smoking status: Former Smoker    Types: Cigarettes    Quit date: 05/20/1991  . Smokeless tobacco: Never Used     Comment: Used to smoke a pack in 1-2 weeks/ 1992  .  Alcohol use 0.0 oz/week     Comment: social  . Drug use: No  . Sexual activity: Yes    Birth control/ protection: Surgical   Other Topics Concern  . Not on file   Social History Narrative  . No narrative on file       Family History  Problem Relation Age of Onset  . Coronary artery disease Father   . Heart disease Father   . Barrett's esophagus Father   . Hypertension Mother   . Coronary artery disease Paternal Uncle   . Coronary artery disease Cousin      Review of Systems Constitutional: negative for anorexia, fevers  and sweats  Eyes: negative for irritation, redness and visual disturbance  Ears, nose, mouth, throat, and face: negative for earaches, epistaxis, nasal congestion and sore throat  Respiratory: negative for  dyspnea on exertion, sputum and wheezing  Cardiovascular: negative for chest pain, dyspnea, lower extremity edema, orthopnea, palpitations and syncope  Gastrointestinal: negative for abdominal pain, constipation, diarrhea, melena, nausea and vomiting  Genitourinary:negative for dysuria, frequency and hematuria  Hematologic/lymphatic: negative for bleeding, easy bruising and lymphadenopathy  Musculoskeletal:negative for arthralgias, muscle weakness and stiff joints  Neurological: negative for coordination problems, gait problems, headaches and weakness  Endocrine: negative for diabetic symptoms including polydipsia, polyuria and weight loss     Objective:   Physical Exam   Gen. Pleasant, obese, in no distress, normal affect ENT - no lesions, no post nasal drip, class 2-3 airway Neck: No JVD, no thyromegaly, no carotid bruits Lungs: no use of accessory muscles, no dullness to percussion, decreased without rales or rhonchi  Cardiovascular: Rhythm regular, heart sounds  normal, no murmurs or gallops, no peripheral edema Abdomen: soft and non-tender, no hepatosplenomegaly, BS normal. Musculoskeletal: No deformities, no cyanosis or clubbing Neuro:  alert, non focal, no tremors        Assessment & Plan:

## 2016-12-07 LAB — RESPIRATORY ALLERGY PROFILE REGION II ~~LOC~~
ALLERGEN, OAK, T7: 0.12 kU/L — AB
Allergen, A. alternata, m6: <0.1 kU/L
Allergen, C. Herbarum, M2: <0.1 kU/L
Allergen, Cedar tree, t12: 0.11 kU/L — ABNORMAL HIGH
Allergen, Cottonwood, t14: <0.1 kU/L
Allergen, D pternoyssinus,d7: <0.1 kU/L
Allergen, Mouse Urine Protein, e78: <0.1 kU/L
Allergen, Mulberry, t76: <0.1 kU/L
Allergen, P. notatum, m1: <0.1 kU/L
Aspergillus fumigatus, m3: <0.1 kU/L
Bermuda Grass: 0.11 kU/L — ABNORMAL HIGH
Common Ragweed: <0.1 kU/L
D. farinae: <0.1 kU/L
Elm IgE: 0.1 kU/L — ABNORMAL HIGH
IGE (IMMUNOGLOBULIN E), SERUM: 14 kU/L (ref <115)
Johnson Grass: 0.1 kU/L — ABNORMAL HIGH
Pecan/Hickory Tree IgE: <0.1 kU/L
Rough Pigweed  IgE: <0.1 kU/L
Sheep Sorrel IgE: 0.12 kU/L — ABNORMAL HIGH
Timothy Grass: 0.11 kU/L — ABNORMAL HIGH

## 2016-12-08 ENCOUNTER — Encounter: Payer: Self-pay | Admitting: Pulmonary Disease

## 2016-12-28 ENCOUNTER — Encounter: Payer: Self-pay | Admitting: Adult Health

## 2016-12-28 ENCOUNTER — Ambulatory Visit (INDEPENDENT_AMBULATORY_CARE_PROVIDER_SITE_OTHER): Payer: No Typology Code available for payment source | Admitting: Adult Health

## 2016-12-28 VITALS — BP 128/90 | HR 95 | Ht 64.0 in | Wt 242.6 lb

## 2016-12-28 DIAGNOSIS — R053 Chronic cough: Secondary | ICD-10-CM

## 2016-12-28 DIAGNOSIS — R05 Cough: Secondary | ICD-10-CM

## 2016-12-28 LAB — NITRIC OXIDE: NITRIC OXIDE: 22

## 2016-12-28 MED ORDER — BENZONATATE 200 MG PO CAPS: 200.0000 mg | ORAL_CAPSULE | Freq: Three times a day (TID) | ORAL | 3 refills | Status: DC | PRN

## 2016-12-28 NOTE — Patient Instructions (Signed)
Begin Delsym 2 teaspoons twice daily for cough needed Tessalon 3 times a day for cough as needed Begin Claritin 10 mg daily in the morning Continue on chlorphentermine 4 mg at bedtime Avoid mint products Reflux diet CT sinus. Follow-up with Dr. Vassie LollAlva in 6 weeks with PFT and As needed   Please contact office for sooner follow up if symptoms do not improve or worsen or seek emergency care

## 2016-12-28 NOTE — Assessment & Plan Note (Signed)
Chronic Cough  FENO 22 today  Check PFT , CT sinus   Plan  Patient Instructions  Begin Delsym 2 teaspoons twice daily for cough needed Tessalon 3 times a day for cough as needed Begin Claritin 10 mg daily in the morning Continue on chlorphentermine 4 mg at bedtime Avoid mint products Reflux diet CT sinus. Follow-up with Dr. Vassie LollAlva in 6 weeks with PFT and As needed   Please contact office for sooner follow up if symptoms do not improve or worsen or seek emergency care

## 2016-12-28 NOTE — Progress Notes (Signed)
 @Patient  ID: Candice Stanley, female    DOB: May 12, 1966, 51 y.o.   MRN: [id number]  Chief Complaint  Patient presents with  . Follow-up    Cough     Referring provider: Toma DeitersHasanaj, Xaje A, MD  HPI: 51 yo female former smoker seen for pulmonary consult 12/06/16 for chronic cough that began 11/02/16.   TEST  10/2016 CT chest >neg PE , lung clear    12/28/2016 Follow up : Chronic cough  Patient returns for a two-week follow-up. She was seen last visit for pulmonary consult for chronic cough that began around April 2018. Patient says that at least once a year she gets a cough. It takes several weeks to go away. This year she got it in April and has taken antibiotic and a steroid without much relief. She was seen last visit. Lab work showed IgE was normal, positive to grass and trees.  CT chest in April was negative for pulmonary emboli Lungs were clear. Last visit. She was recommended to begin Chlor-Trimeton at bedtime. She says her cough is only minimally improved. She continues to have intermittent throat clearing and dry cough. She denies any fever, hemoptysis, chest pain. Patient says that she does have a lot of sinus congestion, drainage and postnasal drip. Patient feels that she's had chronic sinus problems over the years. She did have tooth extraction yesterday and is on amoxicillin for 1 week.  Allergies  Allergen Reactions  . Codeine Nausea And Vomiting  . Dilaudid [Hydromorphone Hcl]     Drop in blood pressure  . Tape Swelling and Other (See Comments)    Rash(paper tape)  . Vicodin [Hydrocodone-Acetaminophen] Nausea And Vomiting  . Latex Rash    Immunization History  Administered Date(s) Administered  . Tdap 05/09/2016    Past Medical History:  Diagnosis Date  . Anxiety   . Depression   . GERD (gastroesophageal reflux disease)   . Hypercholesteremia   . Hypothyroidism   . Trimalleolar fracture of ankle, closed, right, sequela   . Type 2 diabetes mellitus (HCC)      Tobacco History: History  Smoking Status  . Former Smoker  . Types: Cigarettes  . Quit date: 05/20/1991  Smokeless Tobacco  . Never Used    Comment: Used to smoke a pack in 1-2 weeks/ 1992   Counseling given: Not Answered   Outpatient Encounter Prescriptions as of 12/28/2016  Medication Sig  . ALPRAZolam (XANAX) 0.5 MG tablet Take 0.25 mg by mouth daily as needed. For anxiety  . aspirin EC 325 MG tablet Take 325 mg by mouth daily.  . Biotin 5000 MCG TABS Take 1 tablet by mouth daily.  Marland Kitchen. dexlansoprazole (DEXILANT) 60 MG capsule Take 1 capsule (60 mg total) by mouth daily.  Marland Kitchen. FLUoxetine (PROZAC) 40 MG capsule Take 40 mg by mouth daily.  Marland Kitchen. KRILL OIL PO Take 1 capsule by mouth daily.  Marland Kitchen. levothyroxine (SYNTHROID, LEVOTHROID) 50 MCG tablet Take 50 mcg by mouth daily.   Marland Kitchen. loratadine (CLARITIN) 10 MG tablet Take 10 mg by mouth daily.  Marland Kitchen. lovastatin (MEVACOR) 20 MG tablet Take 20 mg by mouth 4 (four) times daily.   . metFORMIN (GLUCOPHAGE) 500 MG tablet Take 500 mg by mouth daily with breakfast.  . Multiple Vitamin (MULTIVITAMIN WITH MINERALS) TABS Take 1 tablet by mouth daily.  . ranitidine (ZANTAC) 150 MG tablet Take 300 mg by mouth at bedtime.   . benzonatate (TESSALON) 200 MG capsule Take 1 capsule (200 mg total) by mouth  3 (three) times daily as needed for cough.  . Dextromethorphan-Guaifenesin (DELSYM COUGH/CHEST CONGEST DM) 5-100 MG/5ML LIQD Take 5-10 mLs by mouth daily as needed (cough).  . [DISCONTINUED] ondansetron (ZOFRAN ODT) 4 MG disintegrating tablet 4mg  ODT q4 hours prn nausea/vomit (Patient not taking: Reported on 12/28/2016)   No facility-administered encounter medications on file as of 12/28/2016.      Review of Systems  Constitutional:   No  weight loss, night sweats,  Fevers, chills, fatigue, or  lassitude.  HEENT:   No headaches,  Difficulty swallowing,  Tooth/dental problems, or  Sore throat,                No sneezing, itching, ear ache,  +nasal congestion,  post nasal drip,   CV:  No chest pain,  Orthopnea, PND, swelling in lower extremities, anasarca, dizziness, palpitations, syncope.   GI  No heartburn, indigestion, abdominal pain, nausea, vomiting, diarrhea, change in bowel habits, loss of appetite, bloody stools.   Resp:    No chest wall deformity  Skin: no rash or lesions.  GU: no dysuria, change in color of urine, no urgency or frequency.  No flank pain, no hematuria   MS:  No joint pain or swelling.  No decreased range of motion.  No back pain.    Physical Exam  BP 128/90 (BP Location: Left Arm, Cuff Size: Large)   Pulse 95   Ht 5\' 4"  (1.626 m)   Wt 242 lb 9.6 oz (110 kg)   SpO2 96%   BMI 41.64 kg/m   GEN: A/Ox3; pleasant , NAD    HEENT:  [place]/AT,  EACs-clear, TMs-wnl, NOSE-clear, THROAT-clear, no lesions, no postnasal drip or exudate noted.   NECK:  Supple w/ fair ROM; no JVD; normal carotid impulses w/o bruits; no thyromegaly or nodules palpated; no lymphadenopathy.    RESP  Clear  P & A; w/o, wheezes/ rales/ or rhonchi. no accessory muscle use, no dullness to percussion  CARD:  RRR, no m/r/g, no peripheral edema, pulses intact, no cyanosis or clubbing.  GI:   Soft & nt; nml bowel sounds; no organomegaly or masses detected.   Musco: Warm bil, no deformities or joint swelling noted.   Neuro: alert, no focal deficits noted.    Skin: Warm, no lesions or rashes    Lab Results:  CBC  BMET  BNP No results found for: BNP  ProBNP Imaging: No results found.   Assessment & Plan:   Chronic cough Chronic Cough  FENO 22 today  Check PFT , CT sinus   Plan  Patient Instructions  Begin Delsym 2 teaspoons twice daily for cough needed Tessalon 3 times a day for cough as needed Begin Claritin 10 mg daily in the morning Continue on chlorphentermine 4 mg at bedtime Avoid mint products Reflux diet CT sinus. Follow-up with Dr. Vassie Loll in 6 weeks with PFT and As needed   Please contact office for sooner follow up  if symptoms do not improve or worsen or seek emergency care         Rubye Oaks, NP 12/28/2016

## 2017-01-02 NOTE — Progress Notes (Signed)
Reviewed & agree with plan  

## 2017-01-18 ENCOUNTER — Inpatient Hospital Stay: Admission: RE | Admit: 2017-01-18 | Payer: Self-pay | Source: Ambulatory Visit

## 2017-01-23 NOTE — Addendum Note (Signed)
Addendum  created 01/23/17 1459 by Jairo BenJackson, Williamson Cavanah, MD   Sign clinical note

## 2017-01-23 NOTE — Anesthesia Postprocedure Evaluation (Deleted)
Anesthesia Post Note  Patient: Candice SaucerDawn B Shrode  Procedure(s) Performed: Procedure(s) (LRB): OPEN REDUCTION INTERNAL FIXATION (ORIF) RIGHT TRIMALLEOLAR FRACTURE (Right)     Anesthesia Post Evaluation  Last Vitals:  Vitals:   08/04/16 1800 08/04/16 1830  BP: 131/72 (!) 150/75  Pulse: 74 72  Resp: 17 18  Temp:  36.6 C    Last Pain:  Vitals:   08/05/16 1015  TempSrc:   PainSc: 2                  Audris Speaker,E. Darwyn Ponzo

## 2017-01-23 NOTE — Addendum Note (Signed)
Addendum  created 01/23/17 1659 by Jairo BenJackson, Ercell Razon, MD   Delete clinical note, Sign clinical note

## 2017-01-25 ENCOUNTER — Encounter: Payer: Self-pay | Admitting: Adult Health

## 2017-02-03 ENCOUNTER — Emergency Department (HOSPITAL_COMMUNITY): Payer: No Typology Code available for payment source

## 2017-02-03 ENCOUNTER — Emergency Department (HOSPITAL_COMMUNITY)
Admission: EM | Admit: 2017-02-03 | Discharge: 2017-02-03 | Disposition: A | Discharge status: Home / Self Care | Payer: No Typology Code available for payment source | Attending: Emergency Medicine | Admitting: Emergency Medicine

## 2017-02-03 ENCOUNTER — Encounter (HOSPITAL_COMMUNITY): Payer: Self-pay

## 2017-02-03 DIAGNOSIS — Z79899 Other long term (current) drug therapy: Secondary | ICD-10-CM | POA: Diagnosis not present

## 2017-02-03 DIAGNOSIS — E119 Type 2 diabetes mellitus without complications: Secondary | ICD-10-CM | POA: Insufficient documentation

## 2017-02-03 DIAGNOSIS — E039 Hypothyroidism, unspecified: Secondary | ICD-10-CM | POA: Insufficient documentation

## 2017-02-03 DIAGNOSIS — M79602 Pain in left arm: Secondary | ICD-10-CM | POA: Diagnosis present

## 2017-02-03 DIAGNOSIS — Z7984 Long term (current) use of oral hypoglycemic drugs: Secondary | ICD-10-CM | POA: Diagnosis not present

## 2017-02-03 DIAGNOSIS — Z7982 Long term (current) use of aspirin: Secondary | ICD-10-CM | POA: Insufficient documentation

## 2017-02-03 DIAGNOSIS — R110 Nausea: Secondary | ICD-10-CM | POA: Diagnosis not present

## 2017-02-03 LAB — CBC WITH DIFFERENTIAL/PLATELET
BASOS PCT: 0 %
Basophils Absolute: 0 10*3/uL (ref 0.0–0.1)
EOS ABS: 0.1 10*3/uL (ref 0.0–0.7)
Eosinophils Relative: 1 %
HEMATOCRIT: 36.9 % (ref 36.0–46.0)
Hemoglobin: 12.5 g/dL (ref 12.0–15.0)
Lymphocytes Relative: 54 %
Lymphs Abs: 2.3 10*3/uL (ref 0.7–4.0)
MCH: 29.8 pg (ref 26.0–34.0)
MCHC: 33.9 g/dL (ref 30.0–36.0)
MCV: 87.9 fL (ref 78.0–100.0)
MONO ABS: 0.3 10*3/uL (ref 0.1–1.0)
MONOS PCT: 7 %
NEUTROS ABS: 1.6 10*3/uL — AB (ref 1.7–7.7)
Neutrophils Relative %: 37 %
Platelets: 110 10*3/uL — ABNORMAL LOW (ref 150–400)
RBC: 4.2 MIL/uL (ref 3.87–5.11)
RDW: 14.2 % (ref 11.5–15.5)
WBC: 4.2 10*3/uL (ref 4.0–10.5)

## 2017-02-03 LAB — BASIC METABOLIC PANEL
ANION GAP: 8 (ref 5–15)
BUN: 14 mg/dL (ref 6–20)
CALCIUM: 9.7 mg/dL (ref 8.9–10.3)
CO2: 26 mmol/L (ref 22–32)
Chloride: 104 mmol/L (ref 101–111)
Creatinine, Ser: 0.71 mg/dL (ref 0.44–1.00)
GFR calc Af Amer: >60 mL/min (ref >60)
GFR calc non Af Amer: >60 mL/min (ref >60)
GLUCOSE: 173 mg/dL — AB (ref 65–99)
Potassium: 3.7 mmol/L (ref 3.5–5.1)
Sodium: 138 mmol/L (ref 135–145)

## 2017-02-03 LAB — HEPATIC FUNCTION PANEL
ALBUMIN: 4 g/dL (ref 3.5–5.0)
ALT: 35 U/L (ref 14–54)
AST: 58 U/L — AB (ref 15–41)
Alkaline Phosphatase: 137 U/L — ABNORMAL HIGH (ref 38–126)
BILIRUBIN DIRECT: 0.1 mg/dL (ref 0.1–0.5)
BILIRUBIN TOTAL: 0.8 mg/dL (ref 0.3–1.2)
Indirect Bilirubin: 0.7 mg/dL (ref 0.3–0.9)
Total Protein: 7.6 g/dL (ref 6.5–8.1)

## 2017-02-03 LAB — TROPONIN I
Troponin I: <0.03 ng/mL (ref <0.03)
Troponin I: <0.03 ng/mL (ref <0.03)

## 2017-02-03 LAB — LIPASE, BLOOD: Lipase: 50 U/L (ref 11–51)

## 2017-02-03 MED ORDER — ONDANSETRON HCL 4 MG/2ML IJ SOLN
4.0000 mg | Freq: Once | INTRAMUSCULAR | Status: AC
  Administered 2017-02-03: 4 mg via INTRAVENOUS
  Filled 2017-02-03: qty 2

## 2017-02-03 MED ORDER — GI COCKTAIL ~~LOC~~
30.0000 mL | Freq: Once | ORAL | Status: AC
  Administered 2017-02-03: 30 mL via ORAL
  Filled 2017-02-03: qty 30

## 2017-02-03 MED ORDER — PROMETHAZINE HCL 25 MG/ML IJ SOLN
12.5000 mg | Freq: Once | INTRAMUSCULAR | Status: AC
  Administered 2017-02-03: 12.5 mg via INTRAVENOUS
  Filled 2017-02-03: qty 1

## 2017-02-03 NOTE — Discharge Instructions (Signed)
Take your usual prescriptions as previously directed.  Call your regular medical doctor and your Cardiologist today to schedule a follow up appointment this week.  Return to the Emergency Department immediately sooner if worsening.

## 2017-02-03 NOTE — ED Provider Notes (Signed)
Pt received at sign out with 2nd troponin pending. Pt c/o waking up with aching left arm pain, nausea, one episode of emesis. This was followed by pressure in her chest and back. Took Tums. Pain free on arrival to ED.  GI cocktail and zofran given with improvement. No vomiting in ED. Doubt PE as cause for symptoms with low risk [place]. Doubt ACS as cause for symptoms with normal cardiac cath 1 year ago, normal troponin x2 and unchanged EKG from previous.  Tx symptomatically, f/u PMD. Dx and testing d/w pt.  Questions answered.  Verb understanding, agreeable to d/c home with outpt f/u.       Samuel JesterMcManus, Darien Kading, DO 02/03/17 [phone]

## 2017-02-03 NOTE — ED Notes (Signed)
Dr. Clarene DukeMcManus at bedside discussing results.

## 2017-02-03 NOTE — ED Notes (Addendum)
Pt made aware to return if symptoms worsen or if any life threatening symptoms occur.  Pt awake, alert and oriented and can stand and walk with good coordination. Spoke with Dr. Clarene DukeMcManus and pt is okay to drive self home.

## 2017-02-03 NOTE — ED Triage Notes (Signed)
Pt states she awoke with aching pain to her left arm, nausea, vomited a small amt, states now pain is at the base of her neck and upper back.

## 2017-02-03 NOTE — ED Provider Notes (Signed)
[facility]-EMERGENCY DEPT Provider Note   CSN: [id number] Arrival date & time: 02/03/17  0441     History   Chief Complaint Chief Complaint  Patient presents with  . Arm Pain    HPI Candice Stanley is a 51 y.o. female.  HPI  This is a 51 year old female with history of hypercholesterolemia, hypothyroidism, diabetes who presents with left arm pain and nausea. She reports she woke up from sleep with aching left arm pain and nausea. She did have one episode of emesis. She thought it might be her reflux so took some Tums.  She subsequently developed chest pressure and back pain. She has had similar symptoms in the past. She reports cardiac workup last March which was "normal." She denies any chest pain or sweating. Currently she is pain-free. She does report some residual left arm pain.  Strong family history of heart disease.  Chart reviewed. Patient with a normal cardiac catheterization in March 2017. Patient also had a CT scan to evaluate for PE in April 2018 which was negative.  Past Medical History:  Diagnosis Date  . Anxiety   . Depression   . GERD (gastroesophageal reflux disease)   . Hypercholesteremia   . Hypothyroidism   . Trimalleolar fracture of ankle, closed, right, sequela   . Type 2 diabetes mellitus [facility](HCC)     Patient Active Problem List   Diagnosis Date Noted  . Chronic cough 12/06/2016  . Precordial pain   . Family history of early CAD   . Chest pain 09/02/2015  . Dysphagia 04/16/2012  . Odynophagia 04/16/2012  . Unspecified hypothyroidism 11/03/2009  . HYPERLIPIDEMIA 11/03/2009  . OBESITY, UNSPECIFIED 11/03/2009  . DYSTHYMIC DISORDER 11/03/2009  . Esophageal reflux 11/03/2009  . Diaphragmatic hernia 11/03/2009  . CHEST PAIN UNSPECIFIED 11/03/2009    Past Surgical History:  Procedure Laterality Date  . ABDOMINAL HYSTERECTOMY    . CARDIAC CATHETERIZATION N/A 09/10/2015   Procedure: Left Heart Cath and Coronary Angiography;  Surgeon: Corky CraftsJayadeep S Varanasi, MD;   [facility];  Service: Cardiovascular;  Laterality: N/A;  . CHOLECYSTECTOMY    . OPEN REDUCTION INTERNAL FIXATION (ORIF) TIBIA/FIBULA FRACTURE Right 08/04/2016   Procedure: OPEN REDUCTION INTERNAL FIXATION (ORIF) RIGHT TRIMALLEOLAR FRACTURE;  Surgeon: Toni ArthursJohn Hewitt, MD;  Location: [facility];  Service: Orthopedics;  Laterality: Right;  . WRIST SURGERY      OB History    No data available       Home Medications    Prior to Admission medications   Medication Sig Start Date End Date Taking? Authorizing Provider  ALPRAZolam Prudy Feeler(XANAX) 0.5 MG tablet Take 0.25 mg by mouth daily as needed. For anxiety    [provider]  aspirin EC 325 MG tablet Take 325 mg by mouth daily.    [provider]  benzonatate (TESSALON) 200 MG capsule Take 1 capsule (200 mg total) by mouth 3 (three) times daily as needed for cough. 12/28/16   Parrett, Virgel Bouquetammy S, NP  Biotin 5000 MCG TABS Take 1 tablet by mouth daily.    [provider]  dexlansoprazole (DEXILANT) 60 MG capsule Take 1 capsule (60 mg total) by mouth daily. 04/16/12   Joselyn ArrowJones, Kandice L, NP  Dextromethorphan-Guaifenesin (DELSYM COUGH/CHEST CONGEST DM) 5-100 MG/5ML LIQD Take 5-10 mLs by mouth daily as needed (cough).    [provider]  FLUoxetine (PROZAC) 40 MG capsule Take 40 mg by mouth daily.    [provider]  KRILL OIL PO Take 1 capsule  by mouth daily.    [provider]  levothyroxine (SYNTHROID, LEVOTHROID) 50 MCG tablet Take 50 mcg by mouth daily.     [provider]  loratadine (CLARITIN) 10 MG tablet Take 10 mg by mouth daily.    [provider]  lovastatin (MEVACOR) 20 MG tablet Take 20 mg by mouth 4 (four) times daily.     [provider]  metFORMIN (GLUCOPHAGE) 500 MG tablet Take 500 mg by mouth daily with breakfast.    [provider]  Multiple Vitamin (MULTIVITAMIN WITH MINERALS) TABS Take 1 tablet by mouth daily.    [provider]  ranitidine (ZANTAC) 150 MG tablet Take 300 mg by mouth at bedtime.     [provider]    Family History Family History  Problem Relation Age of Onset  . Coronary artery disease Father   . Heart disease Father   . Barrett's esophagus Father   . Hypertension Mother   . Coronary artery disease Paternal Uncle   . Coronary artery disease Cousin     Social History Social History  Substance Use Topics  . Smoking status: Former Smoker    Types: Cigarettes    Quit date: 05/20/1991  . Smokeless tobacco: Never Used     Comment: Used to smoke a pack in 1-2 weeks/ 1992  . Alcohol use 0.0 oz/week     Comment: social     Allergies   Codeine; Dilaudid [hydromorphone hcl]; Tape; Vicodin [hydrocodone-acetaminophen]; and Latex   Review of Systems Review of Systems  Respiratory: Negative for shortness of breath.   Cardiovascular: Positive for chest pain. Negative for leg swelling.  Gastrointestinal: Positive for nausea and vomiting. Negative for abdominal pain, constipation and diarrhea.  All other systems reviewed and are negative.    Physical Exam Updated Vital Signs BP 113/65   Pulse 70   Temp 97.7 F (36.5 C) (Oral)   Resp (!) 22   Ht 5\' 3"  (1.6 m)   Wt 108.9 kg (240 lb)   SpO2 93%   BMI 42.51 kg/m   Physical Exam  Constitutional: She is oriented to person, place, and time. She appears well-developed and well-nourished.  Overweight, no acute distress  HENT:  Head: Normocephalic and atraumatic.  Cardiovascular: Normal rate, regular rhythm and normal heart sounds.   Pulmonary/Chest: Effort normal and breath sounds normal. No respiratory distress. She has no wheezes.  Abdominal: Soft. Bowel sounds are normal. There is no tenderness. There is no guarding.  Neurological: She is alert and oriented to person, place, and time.  Skin: Skin is warm and dry.  Psychiatric: She has a normal mood and affect.  Nursing note and vitals reviewed.    ED  Treatments / Results  Labs (all labs ordered are listed, but only abnormal results are displayed) Labs Reviewed  CBC WITH DIFFERENTIAL/PLATELET - Abnormal; Notable for the following:       Result Value   Platelets 110 (*)    Neutro Abs 1.6 (*)    All other components within normal limits  BASIC METABOLIC PANEL - Abnormal; Notable for the following:    Glucose, Bld 173 (*)    All other components within normal limits  HEPATIC FUNCTION PANEL - Abnormal; Notable for the following:    AST 58 (*)    Alkaline Phosphatase 137 (*)    All other components within normal limits  TROPONIN I  LIPASE, BLOOD  TROPONIN I    EKG  EKG Interpretation  Date/Time:  Friday February 03 2017 04:54:19 EDT Ventricular Rate:  78 PR Interval:    QRS Duration: 93 QT Interval:  391 QTC Calculation: 446 R Axis:   53 Text Interpretation:  Sinus rhythm Low voltage, precordial leads RSR' in V1 or V2, right VCD or RVH Nonspecific T wave abnormality Confirmed by Ross Marcus ([id number]) on 02/03/2017 5:01:02 AM       Radiology Dg Chest 2 View  Result Date: 02/03/2017 CLINICAL DATA:  Awoke with left upper extremity pain and nausea. EXAM: CHEST  2 VIEW COMPARISON:  11/02/2016 FINDINGS: The lungs are clear. The pulmonary vasculature is normal. Heart size is normal. Hilar and mediastinal contours are unremarkable. There is no pleural effusion. IMPRESSION: No active cardiopulmonary disease. Electronically Signed   By: Ellery Plunk M.D.   On: 02/03/2017 05:50    Procedures Procedures (including critical care time)  Medications Ordered in ED Medications  promethazine (PHENERGAN) injection 12.5 mg (not administered)  ondansetron (ZOFRAN) injection 4 mg (4 mg Intravenous Given 02/03/17 0522)  gi cocktail (Maalox,Lidocaine,Donnatal) (30 mLs Oral Given 02/03/17 0522)     Initial Impression / Assessment and Plan / ED Course  I have reviewed the triage vital signs and the nursing notes.  Pertinent labs &  imaging results that were available during my care of the patient were reviewed by me and considered in my medical decision making (see chart for details).     Patient presents with left arm pain, nausea, vomiting, and chest pain. Some features concerning for ACS; however, recent catheterization last year was completely clear coronaries. EKG is nonischemic.  Also with a significant history for GERD. She was given a GI cocktail and Zofran. Lab work is largely unremarkable including troponin. Given risk factors, will repeat troponin at 3 hours. On recheck, patient denies any recurrent chest pain but doesn't endorse persistent nausea. Some improvement with GI cocktail.  Final Clinical Impressions(s) / ED Diagnoses   Final diagnoses:  Left arm pain  Nausea    New Prescriptions New Prescriptions   No medications on file     Shon Baton, MD 02/03/17 [phone]

## 2017-02-16 ENCOUNTER — Other Ambulatory Visit: Payer: Self-pay | Admitting: Pulmonary Disease

## 2017-02-16 DIAGNOSIS — R0600 Dyspnea, unspecified: Secondary | ICD-10-CM

## 2017-02-17 ENCOUNTER — Ambulatory Visit: Payer: Self-pay | Admitting: Adult Health

## 2017-04-14 ENCOUNTER — Ambulatory Visit: Payer: Self-pay | Admitting: Pulmonary Disease

## 2017-04-17 NOTE — Telephone Encounter (Signed)
This encounter was created in error - please disregard.

## 2018-01-23 ENCOUNTER — Emergency Department (HOSPITAL_COMMUNITY): Payer: No Typology Code available for payment source

## 2018-01-23 ENCOUNTER — Other Ambulatory Visit: Payer: Self-pay

## 2018-01-23 ENCOUNTER — Emergency Department (HOSPITAL_COMMUNITY)
Admission: EM | Admit: 2018-01-23 | Discharge: 2018-01-23 | Disposition: A | Discharge status: Home / Self Care | Payer: No Typology Code available for payment source | Attending: Emergency Medicine | Admitting: Emergency Medicine

## 2018-01-23 DIAGNOSIS — R739 Hyperglycemia, unspecified: Secondary | ICD-10-CM

## 2018-01-23 DIAGNOSIS — Z87891 Personal history of nicotine dependence: Secondary | ICD-10-CM | POA: Insufficient documentation

## 2018-01-23 DIAGNOSIS — Z7984 Long term (current) use of oral hypoglycemic drugs: Secondary | ICD-10-CM | POA: Insufficient documentation

## 2018-01-23 DIAGNOSIS — Z79899 Other long term (current) drug therapy: Secondary | ICD-10-CM | POA: Insufficient documentation

## 2018-01-23 DIAGNOSIS — M7918 Myalgia, other site: Secondary | ICD-10-CM

## 2018-01-23 DIAGNOSIS — M791 Myalgia, unspecified site: Secondary | ICD-10-CM | POA: Diagnosis not present

## 2018-01-23 DIAGNOSIS — R079 Chest pain, unspecified: Secondary | ICD-10-CM | POA: Insufficient documentation

## 2018-01-23 DIAGNOSIS — Z7982 Long term (current) use of aspirin: Secondary | ICD-10-CM | POA: Diagnosis not present

## 2018-01-23 DIAGNOSIS — E1165 Type 2 diabetes mellitus with hyperglycemia: Secondary | ICD-10-CM | POA: Diagnosis not present

## 2018-01-23 DIAGNOSIS — E039 Hypothyroidism, unspecified: Secondary | ICD-10-CM | POA: Diagnosis not present

## 2018-01-23 LAB — CBC
HEMATOCRIT: 36.7 % (ref 36.0–46.0)
HEMOGLOBIN: 12.3 g/dL (ref 12.0–15.0)
MCH: 30.4 pg (ref 26.0–34.0)
MCHC: 33.5 g/dL (ref 30.0–36.0)
MCV: 90.6 fL (ref 78.0–100.0)
Platelets: 90 10*3/uL — ABNORMAL LOW (ref 150–400)
RBC: 4.05 MIL/uL (ref 3.87–5.11)
RDW: 13.9 % (ref 11.5–15.5)
WBC: 3.5 10*3/uL — ABNORMAL LOW (ref 4.0–10.5)

## 2018-01-23 LAB — TROPONIN I: Troponin I: <0.03 ng/mL (ref <0.03)

## 2018-01-23 LAB — D-DIMER, QUANTITATIVE: D-Dimer, Quant: 1.1 ug/mL-FEU — ABNORMAL HIGH (ref 0.00–0.50)

## 2018-01-23 LAB — BASIC METABOLIC PANEL
ANION GAP: 6 (ref 5–15)
BUN: 10 mg/dL (ref 6–20)
CALCIUM: 9.7 mg/dL (ref 8.9–10.3)
CO2: 23 mmol/L (ref 22–32)
Chloride: 111 mmol/L (ref 98–111)
Creatinine, Ser: 0.65 mg/dL (ref 0.44–1.00)
GFR calc non Af Amer: >60 mL/min (ref >60)
GLUCOSE: 201 mg/dL — AB (ref 70–99)
POTASSIUM: 3.5 mmol/L (ref 3.5–5.1)
Sodium: 140 mmol/L (ref 135–145)

## 2018-01-23 MED ORDER — IOPAMIDOL (ISOVUE-370) INJECTION 76%
100.0000 mL | Freq: Once | INTRAVENOUS | Status: AC | PRN
  Administered 2018-01-23: 06:00:00 via INTRAVENOUS

## 2018-01-23 MED ORDER — LORAZEPAM 2 MG/ML IJ SOLN
0.5000 mg | Freq: Once | INTRAMUSCULAR | Status: AC
  Administered 2018-01-23: 0.5 mg via INTRAVENOUS
  Filled 2018-01-23: qty 1

## 2018-01-23 MED ORDER — KETOROLAC TROMETHAMINE 30 MG/ML IJ SOLN
30.0000 mg | Freq: Once | INTRAMUSCULAR | Status: AC
  Administered 2018-01-23: 30 mg via INTRAVENOUS
  Filled 2018-01-23: qty 1

## 2018-01-23 MED ORDER — NAPROXEN 250 MG PO TABS: ORAL_TABLET | ORAL | 0 refills | Status: DC

## 2018-01-23 MED ORDER — CYCLOBENZAPRINE HCL 5 MG PO TABS: 5.0000 mg | ORAL_TABLET | Freq: Three times a day (TID) | ORAL | 0 refills | Status: DC | PRN

## 2018-01-23 MED ORDER — ONDANSETRON HCL 4 MG/2ML IJ SOLN
4.0000 mg | Freq: Once | INTRAMUSCULAR | Status: AC
  Administered 2018-01-23: 4 mg via INTRAVENOUS
  Filled 2018-01-23: qty 2
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
## 2018-01-23 NOTE — Discharge Instructions (Signed)
Use ice and heat over the sore muscles. Take the medication as prescribed. Recheck if you feel worse. Try to limit your junk food. Your CBG tonight was 201. Your doctor may need to do an A1C to see if you need to be back on your metformin again.

## 2018-01-23 NOTE — ED Triage Notes (Signed)
Pt c/o nausea and vomiting with pain to her chest and pain to her left arm earlier this evening; pt has had some tums with no relief; pt describes the pain as a tightness that radiates to her neck and jaw and around to her back

## 2018-05-19 ENCOUNTER — Emergency Department (HOSPITAL_COMMUNITY)
Admission: EM | Admit: 2018-05-19 | Discharge: 2018-05-19 | Disposition: A | Discharge status: Home / Self Care | Payer: No Typology Code available for payment source | Attending: Emergency Medicine | Admitting: Emergency Medicine

## 2018-05-19 ENCOUNTER — Emergency Department (HOSPITAL_COMMUNITY): Payer: No Typology Code available for payment source

## 2018-05-19 ENCOUNTER — Encounter (HOSPITAL_COMMUNITY): Payer: Self-pay | Admitting: Emergency Medicine

## 2018-05-19 ENCOUNTER — Other Ambulatory Visit: Payer: Self-pay

## 2018-05-19 DIAGNOSIS — Z79899 Other long term (current) drug therapy: Secondary | ICD-10-CM | POA: Diagnosis not present

## 2018-05-19 DIAGNOSIS — R072 Precordial pain: Secondary | ICD-10-CM

## 2018-05-19 DIAGNOSIS — Z7982 Long term (current) use of aspirin: Secondary | ICD-10-CM | POA: Insufficient documentation

## 2018-05-19 DIAGNOSIS — R112 Nausea with vomiting, unspecified: Secondary | ICD-10-CM | POA: Insufficient documentation

## 2018-05-19 DIAGNOSIS — E119 Type 2 diabetes mellitus without complications: Secondary | ICD-10-CM | POA: Insufficient documentation

## 2018-05-19 DIAGNOSIS — Z87891 Personal history of nicotine dependence: Secondary | ICD-10-CM | POA: Insufficient documentation

## 2018-05-19 DIAGNOSIS — R079 Chest pain, unspecified: Secondary | ICD-10-CM | POA: Diagnosis present

## 2018-05-19 DIAGNOSIS — R0602 Shortness of breath: Secondary | ICD-10-CM | POA: Diagnosis not present

## 2018-05-19 DIAGNOSIS — Z7984 Long term (current) use of oral hypoglycemic drugs: Secondary | ICD-10-CM | POA: Insufficient documentation

## 2018-05-19 DIAGNOSIS — E039 Hypothyroidism, unspecified: Secondary | ICD-10-CM | POA: Diagnosis not present

## 2018-05-19 DIAGNOSIS — Z91040 Latex allergy status: Secondary | ICD-10-CM | POA: Insufficient documentation

## 2018-05-19 LAB — I-STAT TROPONIN, ED
TROPONIN I, POC: 0 ng/mL (ref 0.00–0.08)
Troponin i, poc: 0 ng/mL (ref 0.00–0.08)

## 2018-05-19 LAB — BASIC METABOLIC PANEL
Anion gap: 7 (ref 5–15)
BUN: 10 mg/dL (ref 6–20)
CO2: 22 mmol/L (ref 22–32)
CREATININE: 0.68 mg/dL (ref 0.44–1.00)
Calcium: 9.8 mg/dL (ref 8.9–10.3)
Chloride: 110 mmol/L (ref 98–111)
GFR calc Af Amer: >60 mL/min (ref >60)
GLUCOSE: 169 mg/dL — AB (ref 70–99)
POTASSIUM: 3.5 mmol/L (ref 3.5–5.1)
Sodium: 139 mmol/L (ref 135–145)

## 2018-05-19 LAB — CBC
HCT: 37.7 % (ref 36.0–46.0)
HEMOGLOBIN: 12.3 g/dL (ref 12.0–15.0)
MCH: 29.3 pg (ref 26.0–34.0)
MCHC: 32.6 g/dL (ref 30.0–36.0)
MCV: 89.8 fL (ref 80.0–100.0)
Platelets: 91 10*3/uL — ABNORMAL LOW (ref 150–400)
RBC: 4.2 MIL/uL (ref 3.87–5.11)
RDW: 14.1 % (ref 11.5–15.5)
WBC: 3.6 10*3/uL — ABNORMAL LOW (ref 4.0–10.5)
nRBC: 0 % (ref 0.0–0.2)

## 2018-05-19 MED ORDER — NITROGLYCERIN 0.4 MG SL SUBL
0.4000 mg | SUBLINGUAL_TABLET | Freq: Once | SUBLINGUAL | Status: DC
  Filled 2018-05-19: qty 1

## 2018-05-19 MED ORDER — ALUM & MAG HYDROXIDE-SIMETH 200-200-20 MG/5ML PO SUSP
30.0000 mL | Freq: Once | ORAL | Status: AC
  Administered 2018-05-19: 30 mL via ORAL
  Filled 2018-05-19: qty 30

## 2018-05-19 MED ORDER — LORAZEPAM 0.5 MG PO TABS
0.5000 mg | ORAL_TABLET | Freq: Once | ORAL | Status: AC
  Administered 2018-05-19: 0.5 mg via ORAL
  Filled 2018-05-19: qty 1

## 2018-05-19 MED ORDER — ASPIRIN 81 MG PO CHEW
324.0000 mg | CHEWABLE_TABLET | Freq: Once | ORAL | Status: AC
  Administered 2018-05-19: 324 mg via ORAL
  Filled 2018-05-19: qty 4

## 2018-05-19 NOTE — ED Provider Notes (Signed)
[facility] EMERGENCY DEPARTMENT Provider Note   CSN: [id number] Arrival date & time: 05/19/18  0036     History   Chief Complaint Chief Complaint  Patient presents with  . Chest Pain    HPI Candice Stanley is a 52 y.o. female.  The history is provided by the patient and a relative.  Chest Pain   This is a new problem. The current episode started 6 to 12 hours ago. The problem occurs constantly. The problem has not changed since onset.Pain location: left chest. The pain is moderate. The quality of the pain is described as sharp. The pain does not radiate. The symptoms are aggravated by certain positions. Associated symptoms include nausea, shortness of breath and vomiting. Pertinent negatives include no abdominal pain, no fever and no syncope. Associated symptoms comments: Mild/brief diaphoresis . She has tried antacids for the symptoms. The treatment provided mild relief. Risk factors include obesity.  Pertinent negatives for past medical history include no CAD.  Her family medical history is significant for CAD.  PT With history of depression, GERD, hypercholesterolemia presents with chest pain.  She reports several hours ago she was having dull chest pain, she went to eat greasy food, and soon after began having sharp left-sided chest pain.  She will get this type of pain after eating greasy food, but it did not improve after she vomited.  She reports the pain is worse with movement and palpation of her chest Reports feeling mildly short of breath.  She had a brief episode of diaphoresis that is improved.  She thought this was likely due to reflux, but it did not improve with vomiting. Reports being under a lot of stress recently with her job, as well as family matters.  She feels that this is contributing to her symptoms Past Medical History:  Diagnosis Date  . Anxiety   . Depression   . GERD (gastroesophageal reflux disease)   . Hypercholesteremia   . Hypothyroidism   .  Trimalleolar fracture of ankle, closed, right, sequela   . Type 2 diabetes mellitus [facility])     Patient Active Problem List   Diagnosis Date Noted  . Chronic cough 12/06/2016  . Precordial pain   . Family history of early CAD   . Chest pain 09/02/2015  . Dysphagia 04/16/2012  . Odynophagia 04/16/2012  . Unspecified hypothyroidism 11/03/2009  . HYPERLIPIDEMIA 11/03/2009  . OBESITY, UNSPECIFIED 11/03/2009  . DYSTHYMIC DISORDER 11/03/2009  . Esophageal reflux 11/03/2009  . Diaphragmatic hernia 11/03/2009  . CHEST PAIN UNSPECIFIED 11/03/2009    Past Surgical History:  Procedure Laterality Date  . ABDOMINAL HYSTERECTOMY    . CARDIAC CATHETERIZATION N/A 09/10/2015   Procedure: Left Heart Cath and Coronary Angiography;  Surgeon: Corky Crafts, MD;  Location: [facility] INVASIVE CV LAB;  Service: Cardiovascular;  Laterality: N/A;  . CHOLECYSTECTOMY    . OPEN REDUCTION INTERNAL FIXATION (ORIF) TIBIA/FIBULA FRACTURE Right 08/04/2016   Procedure: OPEN REDUCTION INTERNAL FIXATION (ORIF) RIGHT TRIMALLEOLAR FRACTURE;  Surgeon: Toni Arthurs, MD;  Location: [facility];  Service: Orthopedics;  Laterality: Right;  . WRIST SURGERY       OB History   None      Home Medications    Prior to Admission medications   Medication Sig Start Date End Date Taking? Authorizing Provider  ALPRAZolam Prudy Feeler) 0.5 MG tablet Take 0.25 mg by mouth daily as needed. For anxiety    [provider]  aspirin EC 325 MG tablet Take 325 mg  by mouth daily.    [provider]  benzonatate (TESSALON) 200 MG capsule Take 1 capsule (200 mg total) by mouth 3 (three) times daily as needed for cough. 12/28/16   Parrett, Virgel Bouquet, NP  Biotin 5000 MCG TABS Take 1 tablet by mouth daily.    [provider]  cyclobenzaprine (FLEXERIL) 5 MG tablet Take 1 tablet (5 mg total) by mouth 3 (three) times daily as needed (muscle soreness). 01/23/18   Devoria Albe, MD  dexlansoprazole (DEXILANT) 60 MG  capsule Take 1 capsule (60 mg total) by mouth daily. 04/16/12   Joselyn Arrow, NP  Dextromethorphan-Guaifenesin (DELSYM COUGH/CHEST CONGEST DM) 5-100 MG/5ML LIQD Take 5-10 mLs by mouth daily as needed (cough).    [provider]  FLUoxetine (PROZAC) 40 MG capsule Take 40 mg by mouth daily.    [provider]  KRILL OIL PO Take 1 capsule by mouth daily.    [provider]  levothyroxine (SYNTHROID, LEVOTHROID) 50 MCG tablet Take 50 mcg by mouth daily.     [provider]  loratadine (CLARITIN) 10 MG tablet Take 10 mg by mouth daily.    [provider]  lovastatin (MEVACOR) 20 MG tablet Take 20 mg by mouth 4 (four) times daily.     [provider]  metFORMIN (GLUCOPHAGE) 500 MG tablet Take 500 mg by mouth daily with breakfast.    [provider]  Multiple Vitamin (MULTIVITAMIN WITH MINERALS) TABS Take 1 tablet by mouth daily.    [provider]  naproxen (NAPROSYN) 250 MG tablet Take 1 po BID with food prn pain 01/23/18   Devoria Albe, MD  ranitidine (ZANTAC) 150 MG tablet Take 300 mg by mouth at bedtime.     [provider]    Family History Family History  Problem Relation Age of Onset  . Coronary artery disease Father   . Heart disease Father   . Barrett's esophagus Father   . Hypertension Mother   . Coronary artery disease Paternal Uncle   . Coronary artery disease Cousin     Social History Social History   Tobacco Use  . Smoking status: Former Smoker    Types: Cigarettes    Last attempt to quit: 05/20/1991    Years since quitting: 27.0  . Smokeless tobacco: Never Used  . Tobacco comment: Used to smoke a pack in 1-2 weeks/ 1992  Substance Use Topics  . Alcohol use: Yes    Alcohol/week: 0.0 standard drinks    Comment: social  . Drug use: No     Allergies   Codeine; Dilaudid [hydromorphone hcl]; Tape; Vicodin [hydrocodone-acetaminophen]; and Latex   Review of Systems Review of Systems    Constitutional: Negative for fever.  Respiratory: Positive for shortness of breath.   Cardiovascular: Positive for chest pain. Negative for syncope.  Gastrointestinal: Positive for nausea and vomiting. Negative for abdominal pain.  Psychiatric/Behavioral: The patient is nervous/anxious.   All other systems reviewed and are negative.    Physical Exam Updated Vital Signs BP (!) 164/96 (BP Location: Left Arm)   Pulse 87   Temp (!) 97.5 F (36.4 C) (Oral)   Resp 20   Ht 1.626 m (5\' 4" )   Wt 104.3 kg   SpO2 96%   BMI 39.48 kg/m   Physical Exam CONSTITUTIONAL: Well developed/well nourished, anxious HEAD: Normocephalic/atraumatic EYES: EOMI/PERRL ENMT: Mucous membranes moist NECK: supple no meningeal signs SPINE/BACK:entire spine nontender CV: S1/S2 noted, no murmurs/rubs/gallops noted LUNGS: Lungs are clear to  auscultation bilaterally, no apparent distress Chest-left-sided chest wall tenderness ABDOMEN: soft, nontender  GU:no cva tenderness NEURO: Pt is awake/alert/appropriate, moves all extremitiesx4.  No facial droop.   EXTREMITIES: pulses normal/equal, full ROM SKIN: warm, color normal PSYCH: anxious  ED Treatments / Results  Labs (all labs ordered are listed, but only abnormal results are displayed) Labs Reviewed  BASIC METABOLIC PANEL - Abnormal; Notable for the following components:      Result Value   Glucose, Bld 169 (*)    All other components within normal limits  CBC - Abnormal; Notable for the following components:   WBC 3.6 (*)    Platelets 91 (*)    All other components within normal limits  I-STAT TROPONIN, ED  I-STAT TROPONIN, ED    EKG EKG Interpretation  Date/Time:  Saturday May 19 2018 00:53:04 EST Ventricular Rate:  82 PR Interval:    QRS Duration: 94 QT Interval:  376 QTC Calculation: 440 R Axis:   80 Text Interpretation:  Sinus rhythm Consider left atrial enlargement RSR' in V1 or V2, right VCD or RVH No significant change since  last tracing Confirmed by Zadie Rhine ([id number]) on 05/19/2018 12:56:33 AM   Radiology Dg Chest 2 View  Result Date: 05/19/2018 CLINICAL DATA:  Acute onset of generalized chest pain and left arm pain. Vomiting. EXAM: CHEST - 2 VIEW COMPARISON:  Chest radiograph, and CTA of the chest performed 01/23/2018 FINDINGS: The lungs are well-aerated and clear. There is no evidence of focal opacification, pleural effusion or pneumothorax. The heart is normal in size; the mediastinal contour is within normal limits. No acute osseous abnormalities are seen. IMPRESSION: No acute cardiopulmonary process seen. Electronically Signed   By: Roanna Raider M.D.   On: 05/19/2018 01:33    Procedures Procedures   Medications Ordered in ED Medications  nitroGLYCERIN (NITROSTAT) SL tablet 0.4 mg (0 mg Sublingual Hold 05/19/18 0330)  aspirin chewable tablet 324 mg (324 mg Oral Given 05/19/18 0201)  LORazepam (ATIVAN) tablet 0.5 mg (0.5 mg Oral Given 05/19/18 0202)  alum & mag hydroxide-simeth (MAALOX/MYLANTA) 200-200-20 MG/5ML suspension 30 mL (30 mLs Oral Given 05/19/18 0221)     Initial Impression / Assessment and Plan / ED Course  I have reviewed the triage vital signs and the nursing notes.  Pertinent labs & imaging results that were available during my care of the patient were reviewed by me and considered in my medical decision making (see chart for details).     3:43 AM Patient presents with chest pain.  She reports that initially was dull, and after eating a blooming onion at dinner she began having sharp chest pain.  She thought it was reflux, but after vomiting it did not improve.  She has had extensive work-up previously including a cardiac catheterization in 2017 that was negative CT angios chest July 2019- Initial chest x-ray/troponin are negative.  EKG is unchanged. Current heart score equals 3. Her chest pain is reproducible. Suspicion for ACS/dissection/PE is low 4:53 AM Repeat troponin  negative.  Vital signs have remained appropriate.  She is in no acute distress, and appears improved.  She reports 2 types of pain, one in which her chest is sore with movement, but also reports significant heartburn type symptoms.  She reports a recent change in her medications, and was switched to Tagamet as well as taken Dexilant, which may have triggered the severe episode tonight Encouraged her to continue this, and ensure she is taking maximum dosing, and if no improvement  to follow-up with gastroenterology Dr. Darrick Penna.  Patient is otherwise appropriate for discharge home.  Patient feels comfortable with plan  Final Clinical Impressions(s) / ED Diagnoses   Final diagnoses:  Precordial pain    ED Discharge Orders    None       Zadie Rhine, MD 05/19/18 [phone]

## 2018-05-19 NOTE — ED Triage Notes (Signed)
Patient states that chest pain started around 6:30 PM yesterday. Patient states pain is in the left arm and radiating. Patient states she did vomit. Patient had taken Tums thinking it was acid reflux.

## 2018-05-19 NOTE — Discharge Instructions (Signed)

## 2018-06-04 DIAGNOSIS — E119 Type 2 diabetes mellitus without complications: Secondary | ICD-10-CM | POA: Insufficient documentation

## 2018-06-04 DIAGNOSIS — E876 Hypokalemia: Secondary | ICD-10-CM | POA: Insufficient documentation

## 2018-06-04 DIAGNOSIS — I10 Essential (primary) hypertension: Secondary | ICD-10-CM | POA: Diagnosis not present

## 2018-06-04 DIAGNOSIS — F329 Major depressive disorder, single episode, unspecified: Secondary | ICD-10-CM | POA: Insufficient documentation

## 2018-06-04 DIAGNOSIS — R0789 Other chest pain: Secondary | ICD-10-CM | POA: Diagnosis present

## 2018-06-04 DIAGNOSIS — Z79899 Other long term (current) drug therapy: Secondary | ICD-10-CM | POA: Diagnosis not present

## 2018-06-04 DIAGNOSIS — E039 Hypothyroidism, unspecified: Secondary | ICD-10-CM | POA: Diagnosis not present

## 2018-06-04 DIAGNOSIS — D72819 Decreased white blood cell count, unspecified: Secondary | ICD-10-CM | POA: Diagnosis not present

## 2018-06-04 DIAGNOSIS — K219 Gastro-esophageal reflux disease without esophagitis: Secondary | ICD-10-CM | POA: Diagnosis not present

## 2018-06-04 DIAGNOSIS — D696 Thrombocytopenia, unspecified: Secondary | ICD-10-CM | POA: Insufficient documentation

## 2018-06-04 DIAGNOSIS — F419 Anxiety disorder, unspecified: Secondary | ICD-10-CM | POA: Diagnosis not present

## 2018-06-04 DIAGNOSIS — Z7982 Long term (current) use of aspirin: Secondary | ICD-10-CM | POA: Diagnosis not present

## 2018-06-04 DIAGNOSIS — E785 Hyperlipidemia, unspecified: Secondary | ICD-10-CM | POA: Insufficient documentation

## 2018-06-05 ENCOUNTER — Emergency Department (HOSPITAL_COMMUNITY): Payer: No Typology Code available for payment source

## 2018-06-05 ENCOUNTER — Observation Stay (HOSPITAL_COMMUNITY)
Admission: EM | Admit: 2018-06-05 | Discharge: 2018-06-05 | Disposition: A | Discharge status: Home / Self Care | Payer: No Typology Code available for payment source | Attending: Internal Medicine | Admitting: Internal Medicine

## 2018-06-05 ENCOUNTER — Observation Stay (HOSPITAL_BASED_OUTPATIENT_CLINIC_OR_DEPARTMENT_OTHER): Payer: No Typology Code available for payment source

## 2018-06-05 ENCOUNTER — Other Ambulatory Visit: Payer: Self-pay

## 2018-06-05 ENCOUNTER — Observation Stay (HOSPITAL_COMMUNITY): Payer: No Typology Code available for payment source

## 2018-06-05 ENCOUNTER — Encounter (HOSPITAL_COMMUNITY): Payer: Self-pay | Admitting: *Deleted

## 2018-06-05 DIAGNOSIS — E785 Hyperlipidemia, unspecified: Secondary | ICD-10-CM | POA: Diagnosis present

## 2018-06-05 DIAGNOSIS — K219 Gastro-esophageal reflux disease without esophagitis: Secondary | ICD-10-CM | POA: Diagnosis present

## 2018-06-05 DIAGNOSIS — I370 Nonrheumatic pulmonary valve stenosis: Secondary | ICD-10-CM

## 2018-06-05 DIAGNOSIS — R0789 Other chest pain: Secondary | ICD-10-CM | POA: Diagnosis present

## 2018-06-05 DIAGNOSIS — D72819 Decreased white blood cell count, unspecified: Secondary | ICD-10-CM | POA: Diagnosis present

## 2018-06-05 DIAGNOSIS — R072 Precordial pain: Secondary | ICD-10-CM

## 2018-06-05 DIAGNOSIS — D696 Thrombocytopenia, unspecified: Secondary | ICD-10-CM | POA: Diagnosis present

## 2018-06-05 DIAGNOSIS — E039 Hypothyroidism, unspecified: Secondary | ICD-10-CM | POA: Diagnosis present

## 2018-06-05 DIAGNOSIS — F418 Other specified anxiety disorders: Secondary | ICD-10-CM | POA: Diagnosis present

## 2018-06-05 DIAGNOSIS — R079 Chest pain, unspecified: Secondary | ICD-10-CM

## 2018-06-05 DIAGNOSIS — E876 Hypokalemia: Secondary | ICD-10-CM | POA: Diagnosis present

## 2018-06-05 DIAGNOSIS — R161 Splenomegaly, not elsewhere classified: Secondary | ICD-10-CM

## 2018-06-05 DIAGNOSIS — E119 Type 2 diabetes mellitus without complications: Secondary | ICD-10-CM

## 2018-06-05 DIAGNOSIS — E1165 Type 2 diabetes mellitus with hyperglycemia: Secondary | ICD-10-CM

## 2018-06-05 HISTORY — DX: Essential (primary) hypertension: I10

## 2018-06-05 LAB — BASIC METABOLIC PANEL
ANION GAP: 7 (ref 5–15)
BUN: 10 mg/dL (ref 6–20)
CO2: 22 mmol/L (ref 22–32)
Calcium: 9.3 mg/dL (ref 8.9–10.3)
Chloride: 108 mmol/L (ref 98–111)
Creatinine, Ser: 0.72 mg/dL (ref 0.44–1.00)
GFR calc Af Amer: >60 mL/min (ref >60)
Glucose, Bld: 166 mg/dL — ABNORMAL HIGH (ref 70–99)
POTASSIUM: 3.4 mmol/L — AB (ref 3.5–5.1)
SODIUM: 137 mmol/L (ref 135–145)

## 2018-06-05 LAB — GLUCOSE, CAPILLARY
Glucose-Capillary: 106 mg/dL — ABNORMAL HIGH (ref 70–99)
Glucose-Capillary: 144 mg/dL — ABNORMAL HIGH (ref 70–99)
Glucose-Capillary: 144 mg/dL — ABNORMAL HIGH (ref 70–99)

## 2018-06-05 LAB — CBC
HCT: 37.6 % (ref 36.0–46.0)
HEMOGLOBIN: 12.4 g/dL (ref 12.0–15.0)
MCH: 29.6 pg (ref 26.0–34.0)
MCHC: 33 g/dL (ref 30.0–36.0)
MCV: 89.7 fL (ref 80.0–100.0)
PLATELETS: 87 10*3/uL — AB (ref 150–400)
RBC: 4.19 MIL/uL (ref 3.87–5.11)
RDW: 13.9 % (ref 11.5–15.5)
WBC: 3.6 10*3/uL — AB (ref 4.0–10.5)
nRBC: 0 % (ref 0.0–0.2)

## 2018-06-05 LAB — I-STAT TROPONIN, ED
TROPONIN I, POC: 0 ng/mL (ref 0.00–0.08)
TROPONIN I, POC: 0.02 ng/mL (ref 0.00–0.08)

## 2018-06-05 LAB — ECHOCARDIOGRAM COMPLETE
HEIGHTINCHES: 64 in
WEIGHTICAEL: 4077.63 [oz_av]

## 2018-06-05 LAB — HEMOGLOBIN A1C
Hgb A1c MFr Bld: 5.2 % (ref 4.8–5.6)
MEAN PLASMA GLUCOSE: 102.54 mg/dL

## 2018-06-05 LAB — MAGNESIUM: Magnesium: 1.8 mg/dL (ref 1.7–2.4)

## 2018-06-05 LAB — I-STAT BETA HCG BLOOD, ED (MC, WL, AP ONLY): I-stat hCG, quantitative: <5 m[IU]/mL (ref <5)

## 2018-06-05 LAB — TROPONIN I: Troponin I: 0.03 ng/mL (ref <0.03)

## 2018-06-05 LAB — PHOSPHORUS: Phosphorus: 2.1 mg/dL — ABNORMAL LOW (ref 2.5–4.6)

## 2018-06-05 MED ORDER — MAGNESIUM SULFATE 2 GM/50ML IV SOLN
2.0000 g | Freq: Once | INTRAVENOUS | Status: AC
  Administered 2018-06-05: 2 g via INTRAVENOUS
  Filled 2018-06-05: qty 50

## 2018-06-05 MED ORDER — POTASSIUM PHOSPHATE MONOBASIC 500 MG PO TABS: 500.0000 mg | ORAL_TABLET | Freq: Three times a day (TID) | ORAL | Status: DC

## 2018-06-05 MED ORDER — HEPARIN SODIUM (PORCINE) 5000 UNIT/ML IJ SOLN: 5000.0000 [IU] | Freq: Three times a day (TID) | INTRAMUSCULAR | Status: DC

## 2018-06-05 MED ORDER — ONDANSETRON HCL 4 MG PO TABS: 4.0000 mg | ORAL_TABLET | Freq: Four times a day (QID) | ORAL | Status: DC | PRN

## 2018-06-05 MED ORDER — ALUM & MAG HYDROXIDE-SIMETH 200-200-20 MG/5ML PO SUSP
30.0000 mL | Freq: Once | ORAL | Status: AC
  Administered 2018-06-05: 30 mL via ORAL
  Filled 2018-06-05: qty 30

## 2018-06-05 MED ORDER — K PHOS MONO-SOD PHOS DI & MONO 155-852-130 MG PO TABS
500.0000 mg | ORAL_TABLET | Freq: Three times a day (TID) | ORAL | Status: DC
  Administered 2018-06-05: 500 mg via ORAL
  Filled 2018-06-05 (×2): qty 2

## 2018-06-05 MED ORDER — HEPARIN SODIUM (PORCINE) 5000 UNIT/ML IJ SOLN
5000.0000 [IU] | Freq: Three times a day (TID) | INTRAMUSCULAR | Status: DC
  Administered 2018-06-05: 5000 [IU] via SUBCUTANEOUS
  Filled 2018-06-05: qty 1

## 2018-06-05 MED ORDER — LORAZEPAM 2 MG/ML IJ SOLN
0.7500 mg | Freq: Once | INTRAMUSCULAR | Status: AC
  Administered 2018-06-05: 0.75 mg via INTRAVENOUS
  Filled 2018-06-05: qty 1

## 2018-06-05 MED ORDER — POTASSIUM CHLORIDE CRYS ER 20 MEQ PO TBCR
40.0000 meq | EXTENDED_RELEASE_TABLET | Freq: Once | ORAL | Status: AC
  Administered 2018-06-05: 40 meq via ORAL
  Filled 2018-06-05: qty 2

## 2018-06-05 MED ORDER — POTASSIUM CHLORIDE CRYS ER 20 MEQ PO TBCR
40.0000 meq | EXTENDED_RELEASE_TABLET | Freq: Once | ORAL | Status: AC
  Administered 2018-06-05: 40 meq via ORAL
  Filled 2018-06-05: qty 4

## 2018-06-05 MED ORDER — ASPIRIN 81 MG PO CHEW
324.0000 mg | CHEWABLE_TABLET | Freq: Once | ORAL | Status: AC
  Administered 2018-06-05: 324 mg via ORAL
  Filled 2018-06-05: qty 4

## 2018-06-05 MED ORDER — ACETAMINOPHEN 650 MG RE SUPP: 650.0000 mg | Freq: Four times a day (QID) | RECTAL | Status: DC | PRN

## 2018-06-05 MED ORDER — ACETAMINOPHEN 325 MG PO TABS
650.0000 mg | ORAL_TABLET | Freq: Four times a day (QID) | ORAL | Status: DC | PRN
  Administered 2018-06-05: 650 mg via ORAL
  Filled 2018-06-05: qty 2

## 2018-06-05 MED ORDER — ONDANSETRON HCL 4 MG/2ML IJ SOLN: 4.0000 mg | Freq: Four times a day (QID) | INTRAMUSCULAR | Status: DC | PRN

## 2018-06-05 MED ORDER — HYDROXYZINE HCL 25 MG PO TABS: 25.0000 mg | ORAL_TABLET | ORAL | Status: DC | PRN

## 2018-06-05 NOTE — ED Triage Notes (Signed)
Pt states a couple of hours ago she felt like her whole body had a "hot feeling" and then she became sob and having some chest pain with radiation to her left jaw that causes her to be nauseous

## 2018-06-05 NOTE — Plan of Care (Signed)
  Problem: Education: Goal: Knowledge of General Education information will improve Description Including pain rating scale, medication(s)/side effects and non-pharmacologic comfort measures 06/05/2018 1535 by Darreld Mcleanox, Rishaan Gunner, RN Outcome: Adequate for Discharge 06/05/2018 1126 by Darreld Mcleanox, Fonda Rochon, RN Outcome: Progressing   Problem: Health Behavior/Discharge Planning: Goal: Ability to manage health-related needs will improve 06/05/2018 1535 by Darreld Mcleanox, Zenovia Justman, RN Outcome: Adequate for Discharge 06/05/2018 1126 by Darreld Mcleanox, Chikita Dogan, RN Outcome: Progressing   Problem: Clinical Measurements: Goal: Ability to maintain clinical measurements within normal limits will improve 06/05/2018 1535 by Darreld Mcleanox, Arriana Lohmann, RN Outcome: Adequate for Discharge 06/05/2018 1126 by Darreld Mcleanox, Ximena Todaro, RN Outcome: Progressing Goal: Will remain free from infection 06/05/2018 1535 by Darreld Mcleanox, Ines Warf, RN Outcome: Adequate for Discharge 06/05/2018 1126 by Darreld Mcleanox, Tayden Duran, RN Outcome: Progressing Goal: Diagnostic test results will improve 06/05/2018 1535 by Darreld Mcleanox, Andreana Klingerman, RN Outcome: Adequate for Discharge 06/05/2018 1126 by Darreld Mcleanox, Jacqualine Weichel, RN Outcome: Progressing Goal: Respiratory complications will improve 06/05/2018 1535 by Darreld Mcleanox, Kauan Kloosterman, RN Outcome: Adequate for Discharge 06/05/2018 1126 by Darreld Mcleanox, Mina Babula, RN Outcome: Progressing Goal: Cardiovascular complication will be avoided 06/05/2018 1535 by Darreld Mcleanox, Makylah Bossard, RN Outcome: Adequate for Discharge 06/05/2018 1126 by Darreld Mcleanox, Hamna Asa, RN Outcome: Progressing   Problem: Activity: Goal: Risk for activity intolerance will decrease 06/05/2018 1535 by Darreld Mcleanox, Marieelena Bartko, RN Outcome: Adequate for Discharge 06/05/2018 1126 by Darreld Mcleanox, Ariannie Penaloza, RN Outcome: Progressing   Problem: Nutrition: Goal: Adequate nutrition will be maintained 06/05/2018 1535 by Darreld Mcleanox, Loyalty Arentz, RN Outcome: Adequate for Discharge 06/05/2018 1126 by Darreld Mcleanox, Shermeka Rutt, RN Outcome: Progressing   Problem: Coping: Goal: Level of anxiety will decrease 06/05/2018 1535 by Darreld Mcleanox, Eliza Grissinger,  RN Outcome: Adequate for Discharge 06/05/2018 1126 by Darreld Mcleanox, Falisa Lamora, RN Outcome: Progressing   Problem: Elimination: Goal: Will not experience complications related to bowel motility 06/05/2018 1535 by Darreld Mcleanox, Kimberl Vig, RN Outcome: Adequate for Discharge 06/05/2018 1126 by Darreld Mcleanox, Hovanes Hymas, RN Outcome: Progressing Goal: Will not experience complications related to urinary retention 06/05/2018 1535 by Darreld Mcleanox, Hong Moring, RN Outcome: Adequate for Discharge 06/05/2018 1126 by Darreld Mcleanox, Keneth Borg, RN Outcome: Progressing   Problem: Pain Managment: Goal: General experience of comfort will improve 06/05/2018 1535 by Darreld Mcleanox, Melvenia Favela, RN Outcome: Adequate for Discharge 06/05/2018 1126 by Darreld Mcleanox, Zelta Enfield, RN Outcome: Progressing   Problem: Safety: Goal: Ability to remain free from injury will improve 06/05/2018 1535 by Darreld Mcleanox, Lucindy Borel, RN Outcome: Adequate for Discharge 06/05/2018 1126 by Darreld Mcleanox, Tegh Franek, RN Outcome: Progressing   Problem: Skin Integrity: Goal: Risk for impaired skin integrity will decrease 06/05/2018 1535 by Darreld Mcleanox, Dracen Reigle, RN Outcome: Adequate for Discharge 06/05/2018 1126 by Darreld Mcleanox, Maddelynn Moosman, RN Outcome: Progressing

## 2018-06-05 NOTE — Discharge Instructions (Signed)
Chest Wall Pain °Chest wall pain is pain in or around the bones and muscles of your chest. Sometimes, an injury causes this pain. Sometimes, the cause may not be known. This pain may take several weeks or longer to get better. °Follow these instructions at home: °Pay attention to any changes in your symptoms. Take these actions to help with your pain: °· Rest as told by your health care provider. °· Avoid activities that cause pain. These include any activities that use your chest muscles or your abdominal and side muscles to lift heavy items. °· If directed, apply ice to the painful area: °? Put ice in a plastic bag. °? Place a towel between your skin and the bag. °? Leave the ice on for 20 minutes, 2-3 times per day. °· Take over-the-counter and prescription medicines only as told by your health care provider. °· Do not use tobacco products, including cigarettes, chewing tobacco, and e-cigarettes. If you need help quitting, ask your health care provider. °· Keep all follow-up visits as told by your health care provider. This is important. ° °Contact a health care provider if: °· You have a fever. °· Your chest pain becomes worse. °· You have new symptoms. °Get help right away if: °· You have nausea or vomiting. °· You feel sweaty or light-headed. °· You have a cough with phlegm (sputum) or you cough up blood. °· You develop shortness of breath. °This information is not intended to replace advice given to you by your health care provider. Make sure you discuss any questions you have with your health care provider. °Document Released: 06/27/2005 Document Revised: 11/05/2015 Document Reviewed: 09/22/2014 °[employer] Interactive Patient Education © 2018 [employer] ° °

## 2018-06-05 NOTE — H&P (Signed)
History and Physical    Candice Stanley ION:[id number] DOB: 1966-04-20 DOA: 06/05/2018  PCP: Toma Deiters, MD   Patient coming from: Home.  I have personally briefly reviewed patient's old medical records in [facility]  Chief Complaint:  Chest pain.  HPI: Candice Stanley is a 52 y.o. female with medical history significant of anxiety, depression, GERD, hyperlipidemia, hypertension, hypothyroidism, history of right ankle fracture, type 2 diabetes, morbid obesity with a BMI of 43.75 kg/m who is coming to the emergency department with complaints of having chest pain, burning like, radiated to her jaw and neck, associated with nausea, emesis, palpitations and mild dyspnea.  She states that her pain started around 2100 last night.  She initially thought that it was her reflux symptoms acting up since she ate [place] food, particularly salsa earlier in the evening.  She took her reflux medications, but this did not relieve her symptoms.  She became nauseous and had an episode of emesis.  She had a mild epigastric pain.  She denies PND, orthopnea or recent pitting edema of the lower extremities.  She states that she has decreased exercise tolerance.  She denies fever, chills or diarrhea.  She denies sore throat, rhinorrhea, wheezing or hemoptysis.  She denies constipation, melena or hematochezia.  No dysuria, frequency or hematuria.  She denies polyuria, polydipsia, polyphagia or blurred vision.  She has gained an unknown amount of weight, but had a 15 to 17 pounds decrease after trying a cleansing smoothly recommended by a coworker earlier this year.  Since then, she mentions that she has been off her "diabetes, thyroid and cholesterol medication" since earlier this year.  ED Course: Initial vital signs in the emergency department were: 06/05/18 0022  97.7 F (36.5 C)  88  -  18  151/67Abnormal   Sitting  98 %  Room Air   Troponin so far has been normal.  CBC showed a white count of 3.6,  hemoglobin of 12.4 g/dL and platelets of 87, which is consistent with previous values.  I-STAT hCG was normal.  BMP shows a potassium of 3.4 mmol/L and a glucose of 166 mg/dL, but all other values are within normal limits.  Magnesium was 1.8 and phosphorus 2.1 mg/dL.  EKG shows somewhat flattened lateral leads T waves, when compared to most recent previous EKG.  She had no acute cardiopulmonary pathology on her chest radiograph.  Review of Systems: As per HPI otherwise 10 point review of systems negative.   Past Medical History:  Diagnosis Date  . Anxiety   . Depression   . GERD (gastroesophageal reflux disease)   . Hypercholesteremia   . Hypertension   . Hypothyroidism   . Trimalleolar fracture of ankle, closed, right, sequela   . Type 2 diabetes mellitus (HCC)     Past Surgical History:  Procedure Laterality Date  . ABDOMINAL HYSTERECTOMY    . CARDIAC CATHETERIZATION N/A 09/10/2015   Procedure: Left Heart Cath and Coronary Angiography;  Surgeon: Corky Crafts, MD;  Location: [facility] INVASIVE CV LAB;  Service: Cardiovascular;  Laterality: N/A;  . CHOLECYSTECTOMY    . OPEN REDUCTION INTERNAL FIXATION (ORIF) TIBIA/FIBULA FRACTURE Right 08/04/2016   Procedure: OPEN REDUCTION INTERNAL FIXATION (ORIF) RIGHT TRIMALLEOLAR FRACTURE;  Surgeon: Toni Arthurs, MD;  Location: [facility];  Service: Orthopedics;  Laterality: Right;  . WRIST SURGERY       reports that she quit smoking about 27 years ago. Her  smoking use included cigarettes. She has never used smokeless tobacco. She reports that she drinks alcohol. She reports that she does not use drugs.  Allergies  Allergen Reactions  . Codeine Nausea And Vomiting  . Dilaudid [Hydromorphone Hcl]     Drop in blood pressure  . Tape Swelling and Other (See Comments)    Rash(paper tape)  . Vicodin [Hydrocodone-Acetaminophen] Nausea And Vomiting  . Latex Rash    Family History  Problem Relation Age of Onset  . Coronary artery  disease Father   . Heart disease Father   . Barrett's esophagus Father   . Hypertension Mother   . Coronary artery disease Paternal Uncle   . Coronary artery disease Cousin    Prior to Admission medications   Medication Sig Start Date End Date Taking? Authorizing Provider  dexlansoprazole (DEXILANT) 60 MG capsule Take 1 capsule (60 mg total) by mouth daily. 04/16/12  Yes Joselyn ArrowJones, Kandice L, NP  FLUoxetine (PROZAC) 40 MG capsule Take 40 mg by mouth daily.   Yes [provider]  loratadine (CLARITIN) 10 MG tablet Take 10 mg by mouth daily.   Yes [provider]  Multiple Vitamin (MULTIVITAMIN WITH MINERALS) TABS Take 1 tablet by mouth daily.   Yes [provider]  ranitidine (ZANTAC) 150 MG tablet Take 300 mg by mouth at bedtime.    Yes [provider]  ALPRAZolam Prudy Feeler(XANAX) 0.5 MG tablet Take 0.25 mg by mouth daily as needed. For anxiety    [provider]  aspirin EC 325 MG tablet Take 325 mg by mouth daily.    [provider]  Biotin 5000 MCG TABS Take 1 tablet by mouth daily.    [provider]  Dextromethorphan-Guaifenesin (DELSYM COUGH/CHEST CONGEST DM) 5-100 MG/5ML LIQD Take 5-10 mLs by mouth daily as needed (cough).    [provider]  KRILL OIL PO Take 1 capsule by mouth daily.    [provider]  levothyroxine (SYNTHROID, LEVOTHROID) 50 MCG tablet Take 50 mcg by mouth daily.     [provider]    Physical Exam: Vitals:   06/05/18 0200 06/05/18 0300 06/05/18 0524 06/05/18 0524  BP: 132/74 128/72  117/68  Pulse: 70 71  72  Resp: (!) 23 17  18   Temp:    97.8 F (36.6 C)  TempSrc:    Oral  SpO2: 94% 93%  98%  Weight:   115.6 kg   Height:   5\' 4"  (1.626 m)     Constitutional: Mildly anxious. Eyes: PERRL, lids and conjunctivae normal ENMT: Mucous membranes are moist. Posterior pharynx clear of any exudate or lesions. Neck: normal, supple, no masses, no thyromegaly Respiratory: clear to  auscultation bilaterally, no wheezing, no crackles. Normal respiratory effort. No accessory muscle use.  Cardiovascular: Regular rate and rhythm, no S3, no murmurs / rubs / gallops. No extremity edema. 2+ pedal pulses. No carotid bruits.  Abdomen: Obese, soft, no tenderness, no masses palpated. No hepatosplenomegaly. Bowel sounds positive.  Musculoskeletal: no clubbing / cyanosis. Good ROM, no contractures. Normal muscle tone.  Skin: no rashes, lesions, ulcers on very limited dermatological examination. Neurologic: CN 2-12 grossly intact. Sensation intact, DTR normal. Strength 5/5 in all 4.  Psychiatric: Normal judgment and insight. Alert and oriented x 4.  Mildly anxious mood.   Labs on Admission: I have personally reviewed following labs and imaging studies  CBC: Recent Labs  Lab 06/05/18 0102  WBC 3.6*  HGB 12.4  HCT 37.6  MCV 89.7  PLT 87*   Basic Metabolic Panel: Recent Labs  Lab 06/05/18 0102  NA 137  K 3.4*  CL 108  CO2 22  GLUCOSE 166*  BUN 10  CREATININE 0.72  CALCIUM 9.3  MG 1.8  PHOS 2.1*   GFR: Estimated Creatinine Clearance: 100.4 mL/min (by C-G formula based on SCr of 0.72 mg/dL). Liver Function Tests: No results for input(s): AST, ALT, ALKPHOS, BILITOT, PROT, ALBUMIN in the last 168 hours. No results for input(s): LIPASE, AMYLASE in the last 168 hours. No results for input(s): AMMONIA in the last 168 hours. Coagulation Profile: No results for input(s): INR, PROTIME in the last 168 hours. Cardiac Enzymes: No results for input(s): CKTOTAL, CKMB, CKMBINDEX, TROPONINI in the last 168 hours. BNP (last 3 results) No results for input(s): PROBNP in the last 8760 hours. HbA1C: No results for input(s): HGBA1C in the last 72 hours. CBG: No results for input(s): GLUCAP in the last 168 hours. Lipid Profile: No results for input(s): CHOL, HDL, LDLCALC, TRIG, CHOLHDL, LDLDIRECT in the last 72 hours. Thyroid Function Tests: No results for input(s): TSH, T4TOTAL,  FREET4, T3FREE, THYROIDAB in the last 72 hours. Anemia Panel: No results for input(s): VITAMINB12, FOLATE, FERRITIN, TIBC, IRON, RETICCTPCT in the last 72 hours. Urine analysis:    Component Value Date/Time   COLORURINE YELLOW 11/25/2016 0851   APPEARANCEUR CLEAR 11/25/2016 0851   LABSPEC 1.012 11/25/2016 0851   PHURINE 6.0 11/25/2016 0851   GLUCOSEU NEGATIVE 11/25/2016 0851   HGBUR SMALL (A) 11/25/2016 0851   BILIRUBINUR NEGATIVE 11/25/2016 0851   KETONESUR NEGATIVE 11/25/2016 0851   PROTEINUR NEGATIVE 11/25/2016 0851   UROBILINOGEN 0.2 08/26/2013 0500   NITRITE NEGATIVE 11/25/2016 0851   LEUKOCYTESUR NEGATIVE 11/25/2016 0851    Radiological Exams on Admission: Dg Chest 2 View  Result Date: 06/05/2018 CLINICAL DATA:  Left-sided chest pain and shortness of breath for approximately 5 hours. EXAM: CHEST - 2 VIEW COMPARISON:  05/19/2018 FINDINGS: The heart size and mediastinal contours are within normal limits. Both lungs are clear. The visualized skeletal structures are unremarkable. IMPRESSION: No active cardiopulmonary disease. Electronically Signed   By: Myles Rosenthal M.D.   On: 06/05/2018 02:12    09/10/2015  Left Heart Cath and Coronary Angiography   Conclusion    The left ventricular systolic function is normal.  No significant coronary artery disease.  Normal LVEDP.  Significant tortuosity of the right subclavian. This can be navigated with angle Glidewire but if cardiac cath was needed in an emergency setting, may have to consider other access sites.   Continue with aggressive preventative therapy including regular exercise, diet control, weight loss, blood pressure control, blood sugar control and lipid management. This was also stressed to the patient.   EKG: Independently reviewed. Vent. rate 80 BPM PR interval * ms QRS duration 95 ms QT/QTc 377/435 ms P-R-T axes 62 68 5 Sinus rhythm Probable right ventricular hypertrophy Borderline T abnormalities, anterior  leads T waves on lateral leads look flattened when compared to EKG from 05/19/2018.  Assessment/Plan Principal Problem:   Chest pain Observation/telemetry. The pain has some atypical features, but is different than her usual GERD symptomatology.  Given her multiple CAD risk factors, female gender with atypical symptomatology and pain reflected to her jaw and neck, recent decrease in exercise tolerance, family history of early CAD development on her father at age 10, untreated diabetes, hyperlipidemia and hypothyroidism, will trend troponin levels, get echocardiogram and consult cardiology.  Active Problems:   Hyperlipidemia No longer on  medical therapy. Per patient, her last cholesterol measurement was good.    Esophageal reflux Continue PPI. Continue famotidine at bedtime.    Hypothyroidism Not on levothyroxine. Check TSH level.    Type 2 diabetes mellitus (HCC) Carbohydrate modified diet. Check hemoglobin A1c. CBG monitoring before meals.    Hypophosphatemia Replacing. Follow-up level as needed.    Hypokalemia Replacing. Magnesium has been supplemented. I recommended the patient to have potassium rich foods.    Depression with anxiety Continue fluoxetine. Lorazepam 0.75 mg IVP x1 dose. Hydroxyzine 25 mg every 4 hours as needed for anxiety/nausea.    Thrombocytopenia (HCC) Has splenomegaly on CT earlier this year. NASH? US abdomen imaging. Should follow-up with hematology as an outpatient.    Leukopenia Consider hematology evaluation as an outpatient.      DVT prophylaxis: SCDs. Code Status: Full code. Family Communication:  Disposition Plan: Observation for troponin level trending, electrolyte replacement and further work-up. Consults called: Routine cardiology consult. Admission status: Observation/telemetry.   Bobette Mo MD [facility] Pager [phone].  If 7PM-7AM, please contact night-coverage [email or web address] Password  [email or web address]  06/05/2018, 7:22 AM

## 2018-06-05 NOTE — Discharge Summary (Signed)
Physician Discharge Summary  Candice Stanley ZOX:[id number] DOB: [birth year] DOA: 06/05/2018  PCP: Toma Deiters, MD  Admit date: 06/05/2018  Discharge date: 06/05/2018  Admitted From:Home  Disposition:  Home  Recommendations for Outpatient Follow-up:  1. Follow up with PCP in 1-2 weeks 2. Will need to follow-up with hematologist in the near future considering thrombocytopenia with leukopenia and splenomegaly.  Remain off antiplatelet agents indefinitely.  Home Health: None  Equipment/Devices: None  Discharge Condition: Stable  CODE STATUS: Full  Diet recommendation: Heart Healthy/type 2 diabetes  Brief/Interim Summary: Per HPI from Dr. Robb Matar: Candice Stanley is a 52 y.o. female with medical history significant of anxiety, depression, GERD, hyperlipidemia, hypertension, hypothyroidism, history of right ankle fracture, type 2 diabetes, morbid obesity with a BMI of 43.75 kg/m who is coming to the emergency department with complaints of having chest pain, burning like, radiated to her jaw and neck, associated with nausea, emesis, palpitations and mild dyspnea.  She states that her pain started around 2100 last night.  She initially thought that it was her reflux symptoms acting up since she ate [place] food, particularly salsa earlier in the evening.  She took her reflux medications, but this did not relieve her symptoms.  She became nauseous and had an episode of emesis.  She had a mild epigastric pain.  She denies PND, orthopnea or recent pitting edema of the lower extremities.  She states that she has decreased exercise tolerance.  She denies fever, chills or diarrhea.  She denies sore throat, rhinorrhea, wheezing or hemoptysis.  She denies constipation, melena or hematochezia.  No dysuria, frequency or hematuria.  She denies polyuria, polydipsia, polyphagia or blurred vision.  She has gained an unknown amount of weight, but had a 15 to 17 pounds decrease after trying a cleansing smoothly  recommended by a coworker earlier this year.  Since then, she mentions that she has been off her "diabetes, thyroid and cholesterol medication" since earlier this year.  Patient was admitted for atypical chest pain with negative troponins and EKG changes.  2D echocardiogram with no new findings noted.  There is some significant family history and patient was encouraged to remain on her home medications.  She does appear to have significant amounts of anxiety around this time of year and with the death of her father that occurred several years ago around this time as well.  She was encouraged to remain on her Prozac as well as Xanax as needed and to remain on Dexilant and be careful with her food intake that could stimulate gastritis or acid reflux issues.  She is encouraged to follow-up with her primary care physician as well as with hematology in the near future concerning the fact that she does have some thrombocytopenia with splenomegaly noted on abdominal ultrasound.  2D echocardiogram demonstrates LVEF 60 to 65% with no wall motion abnormalities.  Left ventricular diastolic function was within normal limits.  Discharge Diagnoses:  Principal Problem:   Chest pain Active Problems:   Hyperlipidemia   Esophageal reflux   Hypothyroidism   Type 2 diabetes mellitus (HCC)   Hypophosphatemia   Depression with anxiety   Thrombocytopenia (HCC)   Leukopenia   Hypokalemia  Principal discharge diagnosis: Atypical chest pain related to GERD and anxiety.  Discharge Instructions  Discharge Instructions    Diet - low sodium heart healthy   Complete by:  As directed    Increase activity slowly   Complete by:  As directed  Allergies as of 06/05/2018      Reactions   Codeine Nausea And Vomiting   Dilaudid [hydromorphone Hcl]    Drop in blood pressure   Tape Swelling, Other (See Comments)   Rash(paper tape)   Vicodin [hydrocodone-acetaminophen] Nausea And Vomiting   Latex Rash       Medication List    TAKE these medications   ALPRAZolam 0.5 MG tablet Commonly known as:  XANAX Take 0.25 mg by mouth daily as needed. For anxiety   cimetidine 200 MG tablet Commonly known as:  TAGAMET Take 400 mg by mouth at bedtime.   dexlansoprazole 60 MG capsule Commonly known as:  DEXILANT Take 1 capsule (60 mg total) by mouth daily.   FLUoxetine 40 MG capsule Commonly known as:  PROZAC Take 40 mg by mouth daily.   loratadine 10 MG tablet Commonly known as:  CLARITIN Take 10 mg by mouth daily.   multivitamin with minerals Tabs tablet Take 1 tablet by mouth daily.      Follow-up Information    Toma Deiters, MD Follow up in 1 week(s).   Specialty:  Internal Medicine Contact information: [address] [place] [address] [phone]          Allergies  Allergen Reactions  . Codeine Nausea And Vomiting  . Dilaudid [Hydromorphone Hcl]     Drop in blood pressure  . Tape Swelling and Other (See Comments)    Rash(paper tape)  . Vicodin [Hydrocodone-Acetaminophen] Nausea And Vomiting  . Latex Rash    Consultations:  None   Procedures/Studies: Dg Chest 2 View  Result Date: 06/05/2018 CLINICAL DATA:  Left-sided chest pain and shortness of breath for approximately 5 hours. EXAM: CHEST - 2 VIEW COMPARISON:  05/19/2018 FINDINGS: The heart size and mediastinal contours are within normal limits. Both lungs are clear. The visualized skeletal structures are unremarkable. IMPRESSION: No active cardiopulmonary disease. Electronically Signed   By: Myles Rosenthal M.D.   On: 06/05/2018 02:12   Dg Chest 2 View  Result Date: 05/19/2018 CLINICAL DATA:  Acute onset of generalized chest pain and left arm pain. Vomiting. EXAM: CHEST - 2 VIEW COMPARISON:  Chest radiograph, and CTA of the chest performed 01/23/2018 FINDINGS: The lungs are well-aerated and clear. There is no evidence of focal opacification, pleural effusion or pneumothorax. The heart is normal in size; the  mediastinal contour is within normal limits. No acute osseous abnormalities are seen. IMPRESSION: No acute cardiopulmonary process seen. Electronically Signed   By: Roanna Raider M.D.   On: 05/19/2018 01:33   US Abdomen Complete  Result Date: 06/05/2018 CLINICAL DATA:  Splenomegaly. EXAM: ABDOMEN ULTRASOUND COMPLETE COMPARISON:  CT 01/23/2018.  Abdominal series 11/02/2016. FINDINGS: Gallbladder: Cholecystectomy. Common bile duct: Diameter: 5.2 mm Liver: Increased echogenicity consistent fatty infiltration and/or hepatocellular disease. Portal vein is patent on color Doppler imaging with normal direction of blood flow towards the liver. IVC: No abnormality visualized. Pancreas: Visualized portion unremarkable. Spleen: Spleen is enlarged at 18.8 cm with a volume of 1943.4 cc. Right Kidney: Length: 10.3 cm. Echogenicity within normal limits. No mass or hydronephrosis visualized. Renal blood flow noted. Left Kidney: Length: 13.2 cm. Echogenicity within normal limits. No mass or hydronephrosis visualized. Renal blood flow noted. Abdominal aorta: No aneurysm visualized. Other findings: None. IMPRESSION: 1.  Cholecystectomy.  No biliary distention. 2. Increased hepatic echogenicity consistent fatty infiltration and/or hepatocellular disease. 3.  Splenomegaly. 4. Renal size discrepancy as above. No hydronephrosis noted. No renal mass noted. Bilateral renal  blood flow noted. Previously identified left adrenal mass best identified by prior CT. Electronically Signed   By: Maisie Fushomas  Register   On: 06/05/2018 10:28     Discharge Exam: Vitals:   06/05/18 0524 06/05/18 0927  BP: 117/68 122/61  Pulse: 72 73  Resp: 18 18  Temp: 97.8 F (36.6 C) 98.5 F (36.9 C)  SpO2: 98% 98%   Vitals:   06/05/18 0300 06/05/18 0524 06/05/18 0524 06/05/18 0927  BP: 128/72  117/68 122/61  Pulse: 71  72 73  Resp: 17  18 18   Temp:   97.8 F (36.6 C) 98.5 F (36.9 C)  TempSrc:   Oral Oral  SpO2: 93%  98% 98%  Weight:  115.6  kg    Height:  5\' 4"  (1.626 m)      General: Pt is alert, awake, not in acute distress Cardiovascular: RRR, S1/S2 +, no rubs, no gallops Respiratory: CTA bilaterally, no wheezing, no rhonchi Abdominal: Soft, NT, ND, bowel sounds + Extremities: no edema, no cyanosis    The results of significant diagnostics from this hospitalization (including imaging, microbiology, ancillary and laboratory) are listed below for reference.     Microbiology: No results found for this or any previous visit (from the past 240 hour(s)).   Labs: BNP (last 3 results) No results for input(s): BNP in the last 8760 hours. Basic Metabolic Panel: Recent Labs  Lab 06/05/18 0102  NA 137  K 3.4*  CL 108  CO2 22  GLUCOSE 166*  BUN 10  CREATININE 0.72  CALCIUM 9.3  MG 1.8  PHOS 2.1*   Liver Function Tests: No results for input(s): AST, ALT, ALKPHOS, BILITOT, PROT, ALBUMIN in the last 168 hours. No results for input(s): LIPASE, AMYLASE in the last 168 hours. No results for input(s): AMMONIA in the last 168 hours. CBC: Recent Labs  Lab 06/05/18 0102  WBC 3.6*  HGB 12.4  HCT 37.6  MCV 89.7  PLT 87*   Cardiac Enzymes: Recent Labs  Lab 06/05/18 1148  TROPONINI <0.03   BNP: Invalid input(s): POCBNP CBG: Recent Labs  Lab 06/05/18 0820 06/05/18 1110  GLUCAP 106* 144*   D-Dimer No results for input(s): DDIMER in the last 72 hours. Hgb A1c No results for input(s): HGBA1C in the last 72 hours. Lipid Profile No results for input(s): CHOL, HDL, LDLCALC, TRIG, CHOLHDL, LDLDIRECT in the last 72 hours. Thyroid function studies No results for input(s): TSH, T4TOTAL, T3FREE, THYROIDAB in the last 72 hours.  Invalid input(s): FREET3 Anemia work up No results for input(s): VITAMINB12, FOLATE, FERRITIN, TIBC, IRON, RETICCTPCT in the last 72 hours. Urinalysis    Component Value Date/Time   COLORURINE YELLOW 11/25/2016 0851   APPEARANCEUR CLEAR 11/25/2016 0851   LABSPEC 1.012 11/25/2016 0851    PHURINE 6.0 11/25/2016 0851   GLUCOSEU NEGATIVE 11/25/2016 0851   HGBUR SMALL (A) 11/25/2016 0851   BILIRUBINUR NEGATIVE 11/25/2016 0851   KETONESUR NEGATIVE 11/25/2016 0851   PROTEINUR NEGATIVE 11/25/2016 0851   UROBILINOGEN 0.2 08/26/2013 0500   NITRITE NEGATIVE 11/25/2016 0851   LEUKOCYTESUR NEGATIVE 11/25/2016 0851   Sepsis Labs Invalid input(s): PROCALCITONIN,  WBC,  LACTICIDVEN Microbiology No results found for this or any previous visit (from the past 240 hour(s)).   Time coordinating discharge: 35 minutes  SIGNED:   Erick BlinksPratik D Andreah Goheen, DO [facility] 06/05/2018, 3:26 PM Pager [phone]  If 7PM-7AM, please contact night-coverage [email or web address] Password [email or web address]

## 2018-06-05 NOTE — Progress Notes (Signed)
*  PRELIMINARY RESULTS* Echocardiogram 2D Echocardiogram has been performed.  Jeryl Columbialliott, Jasani Lengel 06/05/2018, 12:05 PM

## 2018-06-05 NOTE — ED Provider Notes (Signed)
[facility] EMERGENCY DEPARTMENT Provider Note   CSN: [id number] Arrival date & time: 06/04/18  2351     History   Chief Complaint Chief Complaint  Patient presents with  . Chest Pain    HPI Candice Stanley is a 52 y.o. female.  HPI 52 year old female comes in with chief complaint of chest pain. Patient has history of GERD, hypertension, hyperlipidemia, diet-controlled diabetes and family history of CAD. Patient states that her current episode started around 9:30 PM when she was laying in the bed.  She started having tingling sensation all over her body followed by chest pain.  Her chest pain is located on the left side below her breast and it is nonradiating.  Pain is described as sharp pain.  She also has noted some jaw pain, nausea and shortness of breath.  Patient has had similar symptoms and associated symptoms in the past -however the location of this pain is more left than typical midsternal pain.  Patient had a cath 3, 2017 which was normal.  She does not follow with cardiology at this time. Pt has no hx of PE, DVT and denies any exogenous hormone (testosterone / estrogen) use, long distance travels or surgery in the past 6 weeks, active cancer, recent immobilization.   Past Medical History:  Diagnosis Date  . Anxiety   . Depression   . GERD (gastroesophageal reflux disease)   . Hypercholesteremia   . Hypertension   . Hypothyroidism   . Trimalleolar fracture of ankle, closed, right, sequela   . Type 2 diabetes mellitus [facility])     Patient Active Problem List   Diagnosis Date Noted  . Chronic cough 12/06/2016  . Precordial pain   . Family history of early CAD   . Chest pain 09/02/2015  . Dysphagia 04/16/2012  . Odynophagia 04/16/2012  . Unspecified hypothyroidism 11/03/2009  . HYPERLIPIDEMIA 11/03/2009  . OBESITY, UNSPECIFIED 11/03/2009  . DYSTHYMIC DISORDER 11/03/2009  . Esophageal reflux 11/03/2009  . Diaphragmatic hernia 11/03/2009  . CHEST PAIN UNSPECIFIED  11/03/2009    Past Surgical History:  Procedure Laterality Date  . ABDOMINAL HYSTERECTOMY    . CARDIAC CATHETERIZATION N/A 09/10/2015   Procedure: Left Heart Cath and Coronary Angiography;  Surgeon: Corky Crafts, MD;  Location: [facility] INVASIVE CV LAB;  Service: Cardiovascular;  Laterality: N/A;  . CHOLECYSTECTOMY    . OPEN REDUCTION INTERNAL FIXATION (ORIF) TIBIA/FIBULA FRACTURE Right 08/04/2016   Procedure: OPEN REDUCTION INTERNAL FIXATION (ORIF) RIGHT TRIMALLEOLAR FRACTURE;  Surgeon: Toni Arthurs, MD;  Location: [facility];  Service: Orthopedics;  Laterality: Right;  . WRIST SURGERY       OB History   None      Home Medications    Prior to Admission medications   Medication Sig Start Date End Date Taking? Authorizing Provider  ALPRAZolam Prudy Feeler) 0.5 MG tablet Take 0.25 mg by mouth daily as needed. For anxiety    [provider]  aspirin EC 325 MG tablet Take 325 mg by mouth daily.    [provider]  Biotin 5000 MCG TABS Take 1 tablet by mouth daily.    [provider]  dexlansoprazole (DEXILANT) 60 MG capsule Take 1 capsule (60 mg total) by mouth daily. 04/16/12   Joselyn Arrow, NP  Dextromethorphan-Guaifenesin (DELSYM COUGH/CHEST CONGEST DM) 5-100 MG/5ML LIQD Take 5-10 mLs by mouth daily as needed (cough).    [provider]  FLUoxetine (PROZAC) 40 MG capsule Take 40 mg by mouth daily.  [provider]  KRILL OIL PO Take 1 capsule by mouth daily.    [provider]  levothyroxine (SYNTHROID, LEVOTHROID) 50 MCG tablet Take 50 mcg by mouth daily.     [provider]  loratadine (CLARITIN) 10 MG tablet Take 10 mg by mouth daily.    [provider]  lovastatin (MEVACOR) 20 MG tablet Take 20 mg by mouth 4 (four) times daily.     [provider]  metFORMIN (GLUCOPHAGE) 500 MG tablet Take 500 mg by mouth daily with breakfast.    [provider]  Multiple Vitamin (MULTIVITAMIN  WITH MINERALS) TABS Take 1 tablet by mouth daily.    [provider]  ranitidine (ZANTAC) 150 MG tablet Take 300 mg by mouth at bedtime.     [provider]    Family History Family History  Problem Relation Age of Onset  . Coronary artery disease Father   . Heart disease Father   . Barrett's esophagus Father   . Hypertension Mother   . Coronary artery disease Paternal Uncle   . Coronary artery disease Cousin     Social History Social History   Tobacco Use  . Smoking status: Former Smoker    Types: Cigarettes    Last attempt to quit: 05/20/1991    Years since quitting: 27.0  . Smokeless tobacco: Never Used  . Tobacco comment: Used to smoke a pack in 1-2 weeks/ 1992  Substance Use Topics  . Alcohol use: Yes    Alcohol/week: 0.0 standard drinks    Comment: social  . Drug use: No     Allergies   Codeine; Dilaudid [hydromorphone hcl]; Tape; Vicodin [hydrocodone-acetaminophen]; and Latex   Review of Systems Review of Systems  Constitutional: Positive for activity change.  Respiratory: Positive for shortness of breath.   Cardiovascular: Positive for chest pain.  Hematological: Does not bruise/bleed easily.  All other systems reviewed and are negative.    Physical Exam Updated Vital Signs BP 128/72   Pulse 71   Temp 97.7 F (36.5 C) (Oral)   Resp 17   Ht 5\' 4"  (1.626 m)   Wt 104.3 kg   SpO2 93%   BMI 39.48 kg/m   Physical Exam  Constitutional: She is oriented to person, place, and time. She appears well-developed.  HENT:  Head: Normocephalic and atraumatic.  Eyes: EOM are normal.  Neck: Neck supple.  Cardiovascular: Normal rate, intact distal pulses and normal pulses.  Pulmonary/Chest: Effort normal.  Abdominal: Bowel sounds are normal.  Musculoskeletal:       Right lower leg: She exhibits no edema.       Left lower leg: She exhibits no edema.  Neurological: She is alert and oriented to person, place, and time.  Skin: Skin is warm and  dry.  Nursing note and vitals reviewed.    ED Treatments / Results  Labs (all labs ordered are listed, but only abnormal results are displayed) Labs Reviewed  BASIC METABOLIC PANEL - Abnormal; Notable for the following components:      Result Value   Potassium 3.4 (*)    Glucose, Bld 166 (*)    All other components within normal limits  CBC - Abnormal; Notable for the following components:   WBC 3.6 (*)    Platelets 87 (*)    All other components within normal limits  I-STAT TROPONIN, ED  I-STAT BETA HCG BLOOD, ED ([facility], [facility], [facility] ONLY)  I-STAT TROPONIN, ED    EKG EKG Interpretation  Date/Time:  Tuesday June 05 2018 00:40:02 EST Ventricular Rate:  80 PR Interval:    QRS Duration: 95 QT Interval:  377 QTC Calculation: 435 R Axis:   68 Text Interpretation:  Sinus rhythm Probable right ventricular hypertrophy Borderline T abnormalities, anterior leads No acute changes No significant change since last tracing Confirmed by Derwood Kaplan [phone]) on 06/05/2018 12:43:38 AM   Radiology Dg Chest 2 View  Result Date: 06/05/2018 CLINICAL DATA:  Left-sided chest pain and shortness of breath for approximately 5 hours. EXAM: CHEST - 2 VIEW COMPARISON:  05/19/2018 FINDINGS: The heart size and mediastinal contours are within normal limits. Both lungs are clear. The visualized skeletal structures are unremarkable. IMPRESSION: No active cardiopulmonary disease. Electronically Signed   By: Myles Rosenthal M.D.   On: 06/05/2018 02:12    Procedures Procedures (including critical care time)  Medications Ordered in ED Medications  aspirin chewable tablet 324 mg (has no administration in time range)  alum & mag hydroxide-simeth (MAALOX/MYLANTA) 200-200-20 MG/5ML suspension 30 mL (30 mLs Oral Given 06/05/18 0328)     Initial Impression / Assessment and Plan / ED Course  I have reviewed the triage vital signs and the nursing notes.  Pertinent labs & imaging results that were available  during my care of the patient were reviewed by me and considered in my medical decision making (see chart for details).  Clinical Course as of Jun 05 358  Tue Jun 05, 2018  6045 I just went in to reassess the patient.  I was feeling comfortable with the plan of getting delta troponin and having patient follow-up with cardiology closely.  Patient states at that time that she had an episode of chest pain again few minutes ago with radiation down her left arm and nausea.  I think it might be better for [place] to keep this patient as a chest pain rule out.  We will give her GI cocktail.  She does appear to have history of GERD.  However, this pain does not appear to be related to GERD.   [AN]    Clinical Course User Index [AN] Derwood Kaplan, MD   52 year old female comes in with chief complaint of chest pain.  She has history of hypertension, hyperlipidemia and significant coronary artery disease in the family. Patient comes in with left-sided chest pain and she has some concerning constitutional such as jaw pain, nausea, shortness of breath.  However, patient has had similar type of symptoms in the past as well and she gets chest pain and shortness of breath with her anxiety as well.  Her symptoms are unprovoked and there does not appear to be a direct anxiety component to the onset of the pain.  It does appear that the chest pain did make her eventually a little anxious and that might have led to nausea and shortness of breath.  She had a clean cath in 2017 which is reassuring.  CT PE was done earlier in the year which was negative.  Plan is to get delta troponin right now heart score is 4.  We will reassess the patient and decide if she needs to be admitted or not.   Final Clinical Impressions(s) / ED Diagnoses   Final diagnoses:  Precordial chest pain    ED Discharge Orders    None       Derwood Kaplan, MD 06/05/18 [phone]

## 2018-06-05 NOTE — Plan of Care (Signed)

## 2018-06-05 NOTE — ED Notes (Signed)
ED Provider at bedside. 

## 2018-10-16 ENCOUNTER — Emergency Department (HOSPITAL_COMMUNITY): Payer: 59

## 2018-10-16 ENCOUNTER — Emergency Department (HOSPITAL_COMMUNITY)
Admission: EM | Admit: 2018-10-16 | Discharge: 2018-10-17 | Disposition: A | Discharge status: Home / Self Care | Payer: 59 | Attending: Emergency Medicine | Admitting: Emergency Medicine

## 2018-10-16 ENCOUNTER — Other Ambulatory Visit: Payer: Self-pay

## 2018-10-16 ENCOUNTER — Encounter (HOSPITAL_COMMUNITY): Payer: Self-pay | Admitting: *Deleted

## 2018-10-16 DIAGNOSIS — E039 Hypothyroidism, unspecified: Secondary | ICD-10-CM | POA: Insufficient documentation

## 2018-10-16 DIAGNOSIS — I10 Essential (primary) hypertension: Secondary | ICD-10-CM | POA: Diagnosis not present

## 2018-10-16 DIAGNOSIS — Z79899 Other long term (current) drug therapy: Secondary | ICD-10-CM | POA: Diagnosis not present

## 2018-10-16 DIAGNOSIS — R5381 Other malaise: Secondary | ICD-10-CM

## 2018-10-16 DIAGNOSIS — R5383 Other fatigue: Secondary | ICD-10-CM | POA: Diagnosis not present

## 2018-10-16 DIAGNOSIS — E119 Type 2 diabetes mellitus without complications: Secondary | ICD-10-CM | POA: Insufficient documentation

## 2018-10-16 DIAGNOSIS — Z7982 Long term (current) use of aspirin: Secondary | ICD-10-CM | POA: Diagnosis not present

## 2018-10-16 DIAGNOSIS — R110 Nausea: Secondary | ICD-10-CM | POA: Insufficient documentation

## 2018-10-16 DIAGNOSIS — Z87891 Personal history of nicotine dependence: Secondary | ICD-10-CM | POA: Insufficient documentation

## 2018-10-16 LAB — COMPREHENSIVE METABOLIC PANEL
ALT: 28 U/L (ref 0–44)
AST: 43 U/L — ABNORMAL HIGH (ref 15–41)
Albumin: 4.1 g/dL (ref 3.5–5.0)
Alkaline Phosphatase: 98 U/L (ref 38–126)
Anion gap: 8 (ref 5–15)
BUN: 8 mg/dL (ref 6–20)
CO2: 24 mmol/L (ref 22–32)
Calcium: 9.6 mg/dL (ref 8.9–10.3)
Chloride: 107 mmol/L (ref 98–111)
Creatinine, Ser: 0.67 mg/dL (ref 0.44–1.00)
GFR calc Af Amer: >60 mL/min (ref >60)
GFR calc non Af Amer: >60 mL/min (ref >60)
Glucose, Bld: 173 mg/dL — ABNORMAL HIGH (ref 70–99)
Potassium: 3.8 mmol/L (ref 3.5–5.1)
Sodium: 139 mmol/L (ref 135–145)
Total Bilirubin: 1.4 mg/dL — ABNORMAL HIGH (ref 0.3–1.2)
Total Protein: 7.6 g/dL (ref 6.5–8.1)

## 2018-10-16 LAB — URINALYSIS, ROUTINE W REFLEX MICROSCOPIC
Bilirubin Urine: NEGATIVE
Glucose, UA: NEGATIVE mg/dL
Hgb urine dipstick: NEGATIVE
Ketones, ur: NEGATIVE mg/dL
Leukocytes,Ua: NEGATIVE
Nitrite: NEGATIVE
Protein, ur: NEGATIVE mg/dL
Specific Gravity, Urine: 1.009 (ref 1.005–1.030)
pH: 5 (ref 5.0–8.0)

## 2018-10-16 LAB — CBC WITH DIFFERENTIAL/PLATELET
Abs Immature Granulocytes: 0.01 10*3/uL (ref 0.00–0.07)
Basophils Absolute: 0 10*3/uL (ref 0.0–0.1)
Basophils Relative: 1 %
Eosinophils Absolute: 0 10*3/uL (ref 0.0–0.5)
Eosinophils Relative: 1 %
HCT: 38.9 % (ref 36.0–46.0)
Hemoglobin: 13.1 g/dL (ref 12.0–15.0)
Immature Granulocytes: 0 %
Lymphocytes Relative: 31 %
Lymphs Abs: 1.3 10*3/uL (ref 0.7–4.0)
MCH: 31 pg (ref 26.0–34.0)
MCHC: 33.7 g/dL (ref 30.0–36.0)
MCV: 92 fL (ref 80.0–100.0)
Monocytes Absolute: 0.2 10*3/uL (ref 0.1–1.0)
Monocytes Relative: 5 %
Neutro Abs: 2.6 10*3/uL (ref 1.7–7.7)
Neutrophils Relative %: 62 %
Platelets: 86 10*3/uL — ABNORMAL LOW (ref 150–400)
RBC: 4.23 MIL/uL (ref 3.87–5.11)
RDW: 14 % (ref 11.5–15.5)
WBC: 4.1 10*3/uL (ref 4.0–10.5)
nRBC: 0 % (ref 0.0–0.2)

## 2018-10-16 LAB — LIPASE, BLOOD: Lipase: 31 U/L (ref 11–51)

## 2018-10-16 LAB — TROPONIN I: Troponin I: <0.03 ng/mL (ref <0.03)

## 2018-10-16 MED ORDER — LORAZEPAM 2 MG/ML IJ SOLN
0.5000 mg | Freq: Once | INTRAMUSCULAR | Status: AC
  Administered 2018-10-16: 21:00:00 0.5 mg via INTRAVENOUS
  Filled 2018-10-16: qty 1

## 2018-10-16 MED ORDER — ONDANSETRON HCL 4 MG/2ML IJ SOLN
4.0000 mg | Freq: Once | INTRAMUSCULAR | Status: AC
  Administered 2018-10-16: 21:00:00 4 mg via INTRAVENOUS
  Filled 2018-10-16: qty 2

## 2018-10-16 NOTE — Discharge Instructions (Addendum)
Your heart enzymes are negative for acute event.  I have reviewed to the electrocardiograms and both of them are negative for acute events.  Your electrolytes are within normal limits.  Your complete blood count is negative for an acute infection.  Your chest x-ray is well within normal limits.  The study for your pancreas is negative. Please see your primary physician for recheck.  Please return to the emergency department if any worsening of your symptoms, changes in your condition, problems, or concerns.  Please continue to self quarantine to finish the 10 to 14 days suggested by the [facility].

## 2018-10-16 NOTE — ED Triage Notes (Signed)
Pt was on the way to her daughter's tonight when she started having pain in bilateral jaw area, upper back and neck area with nausea, left arm "feels different" sudden onset of weakness, also lives in apartment complex with a neighbor who was known positive for covid about a week and half ago,

## 2018-10-16 NOTE — ED Provider Notes (Signed)
[facility] EMERGENCY DEPARTMENT Provider Note   CSN: [id number] Arrival date & time: 10/16/18  2003    History   Chief Complaint Chief Complaint  Patient presents with  . Nausea    HPI Candice Stanley is a 53 y.o. female.     Patient is a 53 year old female who presents to the emergency department with nausea and jaw pain.  The patient has a history of diabetes mellitus, hypertension, hypercholesterolemia, anxiety, and hypothyroid.   The patient states she has been under a great deal of stress recently.  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  1 of her tenants has recently been diagnosed with COVID-19.  She says that she has not been around this tenant.  She presents to the emergency department tonight because she said that she began to have some pain of her face under her eyes and of the maxillary jaw area.  She then noted some pain of the upper shoulder area between the shoulder blades, and then she noted some pain of the lower jaw area.  She states during that time she did not have any loss of consciousness.  There was no sweats.  She did have some mild nausea, but she said the nausea came after she ate a chocolate bar with peanut butter.  She says that she does not digest peanut butter at all and not had pain she feels as a result of that.  She went to visit her daughter and granddaughter, the pain continued to increase and so she came to the emergency department for additional evaluation and management of this issue.  It is of note that the patient has a history of hypertension, hyperlipidemia, diabetes mellitus, and a family history of cardiac disease.  Patient denies the use of cigarettes.  She says she was a former smoker.  The history is provided by the patient.    Past Medical History:  Diagnosis Date  . Anxiety   . Depression   . GERD (gastroesophageal reflux disease)   . Hypercholesteremia   . Hypertension   . Hypothyroidism   . Trimalleolar fracture  of ankle, closed, right, sequela   . Type 2 diabetes mellitus [facility])     Patient Active Problem List   Diagnosis Date Noted  . Hypothyroidism 06/05/2018  . Type 2 diabetes mellitus (HCC) 06/05/2018  . Hypophosphatemia 06/05/2018  . Depression with anxiety 06/05/2018  . Thrombocytopenia (HCC) 06/05/2018  . Leukopenia 06/05/2018  . Hypokalemia 06/05/2018  . Chronic cough 12/06/2016  . Precordial pain   . Family history of early CAD   . Chest pain 09/02/2015  . Dysphagia 04/16/2012  . Odynophagia 04/16/2012  . Unspecified hypothyroidism 11/03/2009  . Hyperlipidemia 11/03/2009  . OBESITY, UNSPECIFIED 11/03/2009  . DYSTHYMIC DISORDER 11/03/2009  . Esophageal reflux 11/03/2009  . Diaphragmatic hernia 11/03/2009  . CHEST PAIN UNSPECIFIED 11/03/2009    Past Surgical History:  Procedure Laterality Date  . ABDOMINAL HYSTERECTOMY    . CARDIAC CATHETERIZATION N/A 09/10/2015   Procedure: Left Heart Cath and Coronary Angiography;  Surgeon: Corky Crafts, MD;  Location: [facility] INVASIVE CV LAB;  Service: Cardiovascular;  Laterality: N/A;  . CHOLECYSTECTOMY    . OPEN REDUCTION INTERNAL FIXATION (ORIF) TIBIA/FIBULA FRACTURE Right 08/04/2016   Procedure: OPEN REDUCTION INTERNAL FIXATION (ORIF) RIGHT TRIMALLEOLAR FRACTURE;  Surgeon: Toni Arthurs, MD;  Location: [facility];  Service: Orthopedics;  Laterality: Right;  . WRIST SURGERY       OB History   No obstetric  history on file.      Home Medications    Prior to Admission medications   Medication Sig Start Date End Date Taking? Authorizing Provider  ALPRAZolam Prudy Feeler(XANAX) 0.5 MG tablet Take 0.25-0.5 mg by mouth daily as needed for anxiety.    Yes [provider]  aspirin EC 81 MG tablet Take 81 mg by mouth daily.   Yes [provider]  Aspirin-Acetaminophen-Caffeine (GOODY HEADACHE PO) Take 1 packet by mouth daily as needed (for pain/headache).   Yes [provider]  cimetidine (TAGAMET) 200 MG  tablet Take 400 mg by mouth at bedtime.   Yes [provider]  dexlansoprazole (DEXILANT) 60 MG capsule Take 1 capsule (60 mg total) by mouth daily. 04/16/12  Yes Joselyn ArrowJones, Kandice L, NP  FLUoxetine (PROZAC) 20 MG capsule Take 20 mg by mouth daily.    Yes [provider]  loratadine (CLARITIN) 10 MG tablet Take 10 mg by mouth daily.   Yes [provider]  Multiple Vitamin (MULTIVITAMIN WITH MINERALS) TABS Take 1 tablet by mouth daily.   Yes [provider]  vitamin C (ASCORBIC ACID) 500 MG tablet Take 500 mg by mouth daily.    Yes [provider]    Family History Family History  Problem Relation Age of Onset  . Coronary artery disease Father   . Heart disease Father   . Barrett's esophagus Father   . Hypertension Mother   . Coronary artery disease Paternal Uncle   . Coronary artery disease Cousin     Social History Social History   Tobacco Use  . Smoking status: Former Smoker    Types: Cigarettes    Last attempt to quit: 05/20/1991    Years since quitting: 27.4  . Smokeless tobacco: Never Used  . Tobacco comment: Used to smoke a pack in 1-2 weeks/ 1992  Substance Use Topics  . Alcohol use: Yes    Alcohol/week: 0.0 standard drinks    Comment: social  . Drug use: No     Allergies   Codeine; Dilaudid [hydromorphone hcl]; Tape; Vicodin [hydrocodone-acetaminophen]; and Latex   Review of Systems Review of Systems  Constitutional: Negative for activity change.       All ROS Neg except as noted in HPI  HENT: Negative for nosebleeds.        Facial pain.  Eyes: Negative for photophobia and discharge.  Respiratory: Negative for cough, shortness of breath, wheezing and stridor.   Cardiovascular: Negative for chest pain and palpitations.  Gastrointestinal: Positive for nausea. Negative for abdominal pain and blood in stool.  Genitourinary: Negative for dysuria, frequency and hematuria.  Musculoskeletal: Positive for arthralgias and back  pain. Negative for neck pain.       Shoulder pain  Skin: Negative.   Neurological: Negative for dizziness, seizures and speech difficulty.  Psychiatric/Behavioral: Negative for confusion and hallucinations. The patient is nervous/anxious.      Physical Exam Updated Vital Signs BP (!) 158/93   Pulse 80   Temp 98.1 F (36.7 C)   Resp 20   Ht 5\' 4"  (1.626 m)   Wt 108.9 kg   SpO2 99%   BMI 41.20 kg/m   Physical Exam Vitals signs and nursing note reviewed.  Constitutional:      Appearance: She is well-developed. She is not toxic-appearing.  HENT:     Head: Normocephalic.     Right Ear: Tympanic membrane and external ear normal.     Left Ear: Tympanic membrane and external ear normal.  Eyes:     General: Lids are normal.     Pupils: Pupils are equal, round, and reactive to light.  Neck:     Musculoskeletal: Normal range of motion and neck supple.     Vascular: No carotid bruit.  Cardiovascular:     Rate and Rhythm: Normal rate and regular rhythm.     Pulses: Normal pulses.     Heart sounds: Normal heart sounds.  Pulmonary:     Effort: No respiratory distress.     Breath sounds: Normal breath sounds.     Comments: There is symmetrical rise and fall of the chest.  The patient speaks in complete sentences without problem. Abdominal:     General: Bowel sounds are normal.     Palpations: Abdomen is soft.     Tenderness: There is no abdominal tenderness. There is no guarding.  Musculoskeletal: Normal range of motion.        General: No swelling.  Lymphadenopathy:     Head:     Right side of head: No submandibular adenopathy.     Left side of head: No submandibular adenopathy.     Cervical: No cervical adenopathy.  Skin:    General: Skin is warm and dry.  Neurological:     Mental Status: She is alert and oriented to person, place, and time.     Cranial Nerves: No cranial nerve deficit.     Sensory: No sensory deficit.  Psychiatric:        Speech: Speech normal.       ED Treatments / Results  Labs (all labs ordered are listed, but only abnormal results are displayed) Labs Reviewed  COMPREHENSIVE METABOLIC PANEL  LIPASE, BLOOD  TROPONIN I  CBC WITH DIFFERENTIAL/PLATELET  URINALYSIS, ROUTINE W REFLEX MICROSCOPIC    EKG None  Radiology No results found.  Procedures Procedures (including critical care time)  Medications Ordered in ED Medications  ondansetron (ZOFRAN) injection 4 mg (4 mg Intravenous Given 10/16/18 2124)  LORazepam (ATIVAN) injection 0.5 mg (0.5 mg Intravenous Given 10/16/18 2125)     Initial Impression / Assessment and Plan / ED Course  I have reviewed the triage vital signs and the nursing notes.  Pertinent labs & imaging results that were available during my care of the patient were reviewed by me and considered in my medical decision making (see chart for details).          Final Clinical Impressions(s) / ED Diagnoses MDM  Pulse oximetry is 99% on room air.  Within normal limits by my interpretation.  The blood pressure is elevated on admission to the emergency department, otherwise vital signs within normal limits.  The patient has been having some jaw pain, pain across the shoulder blades, left arm feeling funny, generally not feeling well, and nausea following a chocolate bar with peanut butter.  The patient states that she has a tenant that was diagnosed positive for the coronavirus, but she is not had contact with this tenant. Patient states she has been under a great deal of stress due to multiple factors recently.  Electrocardiogram showed a normal sinus rhythm.  No acute STEMI, or life-threatening arrhythmias. Urine analysis is within normal limits.  The comprehensive metabolic panel shows the glucose to be elevated at 173.  The total bilirubin is elevated at 1.4, otherwise within normal limits.  The anion gap is normal at 8.  The lipase is normal at 31.  Doubt this is related to her pancreas.  The troponin is  less than 0.03.  Doubt acute cardiac syndrome.  A second electrocardiogram was obtained and again shows a normal sinus rhythm without life-threatening arrhythmia or acute STEMI. Complete blood count is within normal limits with exception of the platelets being 86,000.  This is in the range of the patient's usual range.  The chest x-ray Shows no acute findings.  I discussed these findings with the patient in terms of which she understands.  I have asked her to see her primary physician to complete the work-up of her multiple symptoms.  I have asked her to self quarantine for 10 to 14days.  I have asked her to return to the emergency department immediately if any changes in her condition, worsening of her symptoms, problems, or concerns.     Final diagnoses:  Nausea  Malaise and fatigue    ED Discharge Orders    None       Ivery Quale, PA-C 10/17/18 1510    Vanetta Mulders, MD 10/17/18 1820

## 2018-10-24 DIAGNOSIS — M722 Plantar fascial fibromatosis: Secondary | ICD-10-CM | POA: Diagnosis not present

## 2018-10-24 DIAGNOSIS — E119 Type 2 diabetes mellitus without complications: Secondary | ICD-10-CM | POA: Diagnosis not present

## 2018-10-24 DIAGNOSIS — K210 Gastro-esophageal reflux disease with esophagitis: Secondary | ICD-10-CM | POA: Diagnosis not present

## 2018-10-24 DIAGNOSIS — Z6841 Body Mass Index (BMI) 40.0 and over, adult: Secondary | ICD-10-CM | POA: Diagnosis not present

## 2018-10-24 DIAGNOSIS — I10 Essential (primary) hypertension: Secondary | ICD-10-CM | POA: Diagnosis not present

## 2018-10-24 DIAGNOSIS — J40 Bronchitis, not specified as acute or chronic: Secondary | ICD-10-CM | POA: Diagnosis not present

## 2018-10-24 DIAGNOSIS — E038 Other specified hypothyroidism: Secondary | ICD-10-CM | POA: Diagnosis not present

## 2019-07-16 ENCOUNTER — Ambulatory Visit: Payer: No Typology Code available for payment source | Attending: Internal Medicine

## 2019-07-16 ENCOUNTER — Other Ambulatory Visit: Payer: Self-pay

## 2019-07-16 DIAGNOSIS — Z20822 Contact with and (suspected) exposure to covid-19: Secondary | ICD-10-CM

## 2019-07-16 LAB — NOVEL CORONAVIRUS, NAA: SARS-CoV-2, NAA: NOT DETECTED

## 2019-07-17 ENCOUNTER — Telehealth: Payer: Self-pay | Admitting: *Deleted

## 2019-07-17 NOTE — Telephone Encounter (Signed)
Pt called to check status of covid 19 test result. She is advised that he result is not back at this time. She voiced understanding.

## 2019-07-18 ENCOUNTER — Telehealth: Payer: Self-pay | Admitting: *Deleted

## 2019-07-18 ENCOUNTER — Telehealth: Payer: Self-pay

## 2019-07-18 NOTE — Telephone Encounter (Signed)
Pt. Checking on COVID 19 results, not available yet. °

## 2019-07-18 NOTE — Telephone Encounter (Signed)
Patient called for results ,still pending . 

## 2019-07-19 ENCOUNTER — Telehealth: Payer: Self-pay

## 2019-07-19 NOTE — Telephone Encounter (Signed)
Patient called and was informed that her COVID-19 test done 07/16/19 was still active and has not resulted.  She verbalized understanding and will call again later.

## 2019-07-24 ENCOUNTER — Telehealth: Payer: Self-pay

## 2019-07-24 NOTE — Telephone Encounter (Signed)
Rec'd. COVID test result via email from [facility].  COVID test performed on 07/16/19; "not detected".  Abstracted the result into pt's chart.  Will request [facility] to mail result to her home address, as pt. has requested.

## 2019-07-24 NOTE — Telephone Encounter (Signed)
Pateitn called for COVID-19 test result from 07/16/19. [facility] was contacted because patient chart sys order was discontinued. Spoke with Kym Groom to resolved issue. Patient was then called and informed that her test 07/16/19 for COVID-19 was negative.  She was not infected with the Novel Corona virus.  Patient verbalized understanding.  Result will be mailed to address on file.

## 2019-09-25 ENCOUNTER — Emergency Department (HOSPITAL_COMMUNITY)
Admission: EM | Admit: 2019-09-25 | Discharge: 2019-09-25 | Disposition: A | Discharge status: Home / Self Care | Payer: Self-pay | Attending: Emergency Medicine | Admitting: Emergency Medicine

## 2019-09-25 ENCOUNTER — Encounter (HOSPITAL_COMMUNITY): Payer: Self-pay | Admitting: *Deleted

## 2019-09-25 ENCOUNTER — Emergency Department (HOSPITAL_COMMUNITY): Payer: Self-pay

## 2019-09-25 ENCOUNTER — Other Ambulatory Visit: Payer: Self-pay

## 2019-09-25 DIAGNOSIS — Z7982 Long term (current) use of aspirin: Secondary | ICD-10-CM | POA: Insufficient documentation

## 2019-09-25 DIAGNOSIS — Z87891 Personal history of nicotine dependence: Secondary | ICD-10-CM | POA: Insufficient documentation

## 2019-09-25 DIAGNOSIS — R079 Chest pain, unspecified: Secondary | ICD-10-CM

## 2019-09-25 DIAGNOSIS — Z91040 Latex allergy status: Secondary | ICD-10-CM | POA: Insufficient documentation

## 2019-09-25 DIAGNOSIS — Z79899 Other long term (current) drug therapy: Secondary | ICD-10-CM | POA: Insufficient documentation

## 2019-09-25 DIAGNOSIS — E039 Hypothyroidism, unspecified: Secondary | ICD-10-CM | POA: Insufficient documentation

## 2019-09-25 DIAGNOSIS — I10 Essential (primary) hypertension: Secondary | ICD-10-CM | POA: Insufficient documentation

## 2019-09-25 DIAGNOSIS — R0789 Other chest pain: Secondary | ICD-10-CM | POA: Insufficient documentation

## 2019-09-25 DIAGNOSIS — E119 Type 2 diabetes mellitus without complications: Secondary | ICD-10-CM | POA: Insufficient documentation

## 2019-09-25 LAB — BASIC METABOLIC PANEL
Anion gap: 9 (ref 5–15)
BUN: 7 mg/dL (ref 6–20)
CO2: 24 mmol/L (ref 22–32)
Calcium: 9.4 mg/dL (ref 8.9–10.3)
Chloride: 105 mmol/L (ref 98–111)
Creatinine, Ser: 0.63 mg/dL (ref 0.44–1.00)
GFR calc Af Amer: >60 mL/min (ref >60)
GFR calc non Af Amer: >60 mL/min (ref >60)
Glucose, Bld: 156 mg/dL — ABNORMAL HIGH (ref 70–99)
Potassium: 3.3 mmol/L — ABNORMAL LOW (ref 3.5–5.1)
Sodium: 138 mmol/L (ref 135–145)

## 2019-09-25 LAB — CBC
HCT: 40.8 % (ref 36.0–46.0)
Hemoglobin: 13.6 g/dL (ref 12.0–15.0)
MCH: 31.6 pg (ref 26.0–34.0)
MCHC: 33.3 g/dL (ref 30.0–36.0)
MCV: 94.7 fL (ref 80.0–100.0)
Platelets: 73 10*3/uL — ABNORMAL LOW (ref 150–400)
RBC: 4.31 MIL/uL (ref 3.87–5.11)
RDW: 13.4 % (ref 11.5–15.5)
WBC: 3.2 10*3/uL — ABNORMAL LOW (ref 4.0–10.5)
nRBC: 0 % (ref 0.0–0.2)

## 2019-09-25 LAB — TROPONIN I (HIGH SENSITIVITY)
Troponin I (High Sensitivity): 2 ng/L (ref <18)
Troponin I (High Sensitivity): 3 ng/L (ref <18)

## 2019-09-25 LAB — TSH: TSH: 3.099 u[IU]/mL (ref 0.350–4.500)

## 2019-09-25 MED ORDER — SODIUM CHLORIDE 0.9% FLUSH
3.0000 mL | Freq: Once | INTRAVENOUS | Status: AC
  Administered 2019-09-25: 3 mL via INTRAVENOUS

## 2019-09-25 NOTE — ED Triage Notes (Signed)
Pt c/o intermittent left sided chest pain that started on Sunday. Pt described the pain as pounding. Pt reports family hx of early MI at the age of 75.

## 2019-09-25 NOTE — ED Provider Notes (Addendum)
[facility] EMERGENCY DEPARTMENT Provider Note   CSN: [id number] Arrival date & time: 09/25/19  5397     History Chief Complaint  Patient presents with  . Chest Pain    Candice Stanley is a 54 y.o. female.  Anterior chest pain described as tightness intermittently since Sunday worse with activity.  Cardiac risk factors include hypertension, hyperlipidemia, obesity, inactivity, family history (father with early CAD).  No diaphoresis or nausea.  Pain radiates to bilateral posterior shoulders, jaw, left arm.  Cardiac catheterization x2 in the past.  Last catheterization 3 to 4 years ago described as "normal".        Past Medical History:  Diagnosis Date  . Anxiety   . Depression   . GERD (gastroesophageal reflux disease)   . Hypercholesteremia   . Hypertension   . Hypothyroidism   . Trimalleolar fracture of ankle, closed, right, sequela   . Type 2 diabetes mellitus [facility])     Patient Active Problem List   Diagnosis Date Noted  . Hypothyroidism 06/05/2018  . Type 2 diabetes mellitus (HCC) 06/05/2018  . Hypophosphatemia 06/05/2018  . Depression with anxiety 06/05/2018  . Thrombocytopenia (HCC) 06/05/2018  . Leukopenia 06/05/2018  . Hypokalemia 06/05/2018  . Chronic cough 12/06/2016  . Precordial pain   . Family history of early CAD   . Chest pain 09/02/2015  . Dysphagia 04/16/2012  . Odynophagia 04/16/2012  . Unspecified hypothyroidism 11/03/2009  . Hyperlipidemia 11/03/2009  . OBESITY, UNSPECIFIED 11/03/2009  . DYSTHYMIC DISORDER 11/03/2009  . Esophageal reflux 11/03/2009  . Diaphragmatic hernia 11/03/2009  . CHEST PAIN UNSPECIFIED 11/03/2009    Past Surgical History:  Procedure Laterality Date  . ABDOMINAL HYSTERECTOMY    . CARDIAC CATHETERIZATION N/A 09/10/2015   Procedure: Left Heart Cath and Coronary Angiography;  Surgeon: Corky Crafts, MD;  Location: [facility] INVASIVE CV LAB;  Service: Cardiovascular;  Laterality: N/A;  . CHOLECYSTECTOMY    . OPEN  REDUCTION INTERNAL FIXATION (ORIF) TIBIA/FIBULA FRACTURE Right 08/04/2016   Procedure: OPEN REDUCTION INTERNAL FIXATION (ORIF) RIGHT TRIMALLEOLAR FRACTURE;  Surgeon: Toni Arthurs, MD;  Location: [facility];  Service: Orthopedics;  Laterality: Right;  . WRIST SURGERY       OB History   No obstetric history on file.     Family History  Problem Relation Age of Onset  . Coronary artery disease Father   . Heart disease Father   . Barrett's esophagus Father   . Hypertension Mother   . Coronary artery disease Paternal Uncle   . Coronary artery disease Cousin     Social History   Tobacco Use  . Smoking status: Former Smoker    Types: Cigarettes    Quit date: 05/20/1991    Years since quitting: 28.3  . Smokeless tobacco: Never Used  . Tobacco comment: Used to smoke a pack in 1-2 weeks/ 1992  Substance Use Topics  . Alcohol use: Not Currently    Alcohol/week: 0.0 standard drinks    Comment: social  . Drug use: No    Home Medications Prior to Admission medications   Medication Sig Start Date End Date Taking? Authorizing Provider  ALPRAZolam Prudy Feeler) 0.5 MG tablet Take 0.25-0.5 mg by mouth daily as needed for anxiety.     [provider]  aspirin EC 81 MG tablet Take 81 mg by mouth daily.    [provider]  Aspirin-Acetaminophen-Caffeine (GOODY HEADACHE PO) Take 1 packet by mouth daily as needed (for pain/headache).    [provider]  cimetidine (TAGAMET) 200 MG tablet Take 400 mg by mouth at bedtime.    [provider]  dexlansoprazole (DEXILANT) 60 MG capsule Take 1 capsule (60 mg total) by mouth daily. 04/16/12   Andria Meuse, NP  FLUoxetine (PROZAC) 20 MG capsule Take 20 mg by mouth daily.     [provider]  loratadine (CLARITIN) 10 MG tablet Take 10 mg by mouth daily.    [provider]  Multiple Vitamin (MULTIVITAMIN WITH MINERALS) TABS Take 1 tablet by mouth daily.    [provider]  vitamin C  (ASCORBIC ACID) 500 MG tablet Take 500 mg by mouth daily.     [provider]    Allergies    Codeine, Dilaudid [hydromorphone hcl], Tape, Vicodin [hydrocodone-acetaminophen], and Latex  Review of Systems   Review of Systems  All other systems reviewed and are negative.   Physical Exam Updated Vital Signs BP (!) 157/79 (BP Location: Left Arm)   Pulse 94   Temp 98.1 F (36.7 C) (Oral)   Resp 14   Ht 5\' 4"  (1.626 m)   Wt 124.7 kg   SpO2 98%   BMI 47.20 kg/m   Physical Exam Vitals and nursing note reviewed.  Constitutional:      Appearance: She is well-developed.     Comments: Elevated bmi  HENT:     Head: Normocephalic and atraumatic.  Eyes:     Conjunctiva/sclera: Conjunctivae normal.  Cardiovascular:     Rate and Rhythm: Normal rate and regular rhythm.  Pulmonary:     Effort: Pulmonary effort is normal.     Breath sounds: Normal breath sounds.  Abdominal:     General: Bowel sounds are normal.     Palpations: Abdomen is soft.  Musculoskeletal:        General: Normal range of motion.     Cervical back: Neck supple.  Skin:    General: Skin is warm and dry.  Neurological:     General: No focal deficit present.     Mental Status: She is alert and oriented to person, place, and time.  Psychiatric:        Behavior: Behavior normal.     ED Results / Procedures / Treatments   Labs (all labs ordered are listed, but only abnormal results are displayed) Labs Reviewed  BASIC METABOLIC PANEL - Abnormal; Notable for the following components:      Result Value   Potassium 3.3 (*)    Glucose, Bld 156 (*)    All other components within normal limits  CBC - Abnormal; Notable for the following components:   WBC 3.2 (*)    Platelets 73 (*)    All other components within normal limits  TROPONIN I (HIGH SENSITIVITY)    EKG EKG Interpretation  Date/Time:  Wednesday September 25 2019 09:41:00 EDT Ventricular Rate:  96 PR Interval:    QRS Duration: 105 QT  Interval:  367 QTC Calculation: 464 R Axis:   74 Text Interpretation: Sinus rhythm Confirmed by Nat Christen [id number]) on 09/25/2019 10:27:37 AM   Radiology No results found.  Procedures Procedures (including critical care time)  Medications Ordered in ED Medications  sodium chloride flush (NS) 0.9 % injection 3 mL (3 mLs Intravenous Given 09/25/19 0948)    ED Course  I have reviewed the triage vital signs and the nursing notes.  Pertinent labs & imaging results that were available during my care of the patient were reviewed by me and considered in my  medical decision making (see chart for details).    MDM Rules/Calculators/A&P                      Patient presents with chest pain and associated cardiac risk factors.  She is hemodynamically stable.  Will do typical cardiac work-up.  1400: Patient is hemodynamically stable.  Discussed test results including hyperglycemia.  Recommended cardiology follow-up.  Patient will return if worse. Final Clinical Impression(s) / ED Diagnoses Final diagnoses:  Chest pain, unspecified type    Rx / DC Orders ED Discharge Orders    None       Donnetta Hutching, MD 09/25/19 1043    Donnetta Hutching, MD 09/25/19 [phone]

## 2019-09-25 NOTE — ED Notes (Signed)
ED Provider at bedside. 

## 2019-09-25 NOTE — Discharge Instructions (Addendum)
Test showed no life-threatening condition.  Recommend follow-up with cardiology.  Phone number given.  Return if worse.

## 2020-02-07 ENCOUNTER — Emergency Department (HOSPITAL_COMMUNITY): Payer: Self-pay

## 2020-02-07 ENCOUNTER — Emergency Department (HOSPITAL_COMMUNITY)
Admission: EM | Admit: 2020-02-07 | Discharge: 2020-02-07 | Disposition: A | Discharge status: Home / Self Care | Payer: Self-pay | Attending: Emergency Medicine | Admitting: Emergency Medicine

## 2020-02-07 ENCOUNTER — Other Ambulatory Visit: Payer: Self-pay

## 2020-02-07 ENCOUNTER — Encounter (HOSPITAL_COMMUNITY): Payer: Self-pay | Admitting: Emergency Medicine

## 2020-02-07 DIAGNOSIS — E119 Type 2 diabetes mellitus without complications: Secondary | ICD-10-CM | POA: Insufficient documentation

## 2020-02-07 DIAGNOSIS — R110 Nausea: Secondary | ICD-10-CM | POA: Insufficient documentation

## 2020-02-07 DIAGNOSIS — Z87891 Personal history of nicotine dependence: Secondary | ICD-10-CM | POA: Insufficient documentation

## 2020-02-07 DIAGNOSIS — Z79899 Other long term (current) drug therapy: Secondary | ICD-10-CM | POA: Insufficient documentation

## 2020-02-07 DIAGNOSIS — R0789 Other chest pain: Secondary | ICD-10-CM

## 2020-02-07 DIAGNOSIS — I10 Essential (primary) hypertension: Secondary | ICD-10-CM | POA: Insufficient documentation

## 2020-02-07 DIAGNOSIS — Z91040 Latex allergy status: Secondary | ICD-10-CM | POA: Insufficient documentation

## 2020-02-07 DIAGNOSIS — R61 Generalized hyperhidrosis: Secondary | ICD-10-CM | POA: Insufficient documentation

## 2020-02-07 DIAGNOSIS — R0600 Dyspnea, unspecified: Secondary | ICD-10-CM | POA: Insufficient documentation

## 2020-02-07 DIAGNOSIS — D696 Thrombocytopenia, unspecified: Secondary | ICD-10-CM

## 2020-02-07 DIAGNOSIS — D72819 Decreased white blood cell count, unspecified: Secondary | ICD-10-CM

## 2020-02-07 DIAGNOSIS — Z7982 Long term (current) use of aspirin: Secondary | ICD-10-CM | POA: Insufficient documentation

## 2020-02-07 DIAGNOSIS — E039 Hypothyroidism, unspecified: Secondary | ICD-10-CM | POA: Insufficient documentation

## 2020-02-07 DIAGNOSIS — M79602 Pain in left arm: Secondary | ICD-10-CM | POA: Insufficient documentation

## 2020-02-07 LAB — CBC WITH DIFFERENTIAL/PLATELET
Abs Immature Granulocytes: 0.01 10*3/uL (ref 0.00–0.07)
Basophils Absolute: 0 10*3/uL (ref 0.0–0.1)
Basophils Relative: 1 %
Eosinophils Absolute: 0 10*3/uL (ref 0.0–0.5)
Eosinophils Relative: 1 %
HCT: 36 % (ref 36.0–46.0)
Hemoglobin: 12.1 g/dL (ref 12.0–15.0)
Immature Granulocytes: 0 %
Lymphocytes Relative: 45 %
Lymphs Abs: 1.2 10*3/uL (ref 0.7–4.0)
MCH: 31.3 pg (ref 26.0–34.0)
MCHC: 33.6 g/dL (ref 30.0–36.0)
MCV: 93 fL (ref 80.0–100.0)
Monocytes Absolute: 0.2 10*3/uL (ref 0.1–1.0)
Monocytes Relative: 7 %
Neutro Abs: 1.2 10*3/uL — ABNORMAL LOW (ref 1.7–7.7)
Neutrophils Relative %: 46 %
Platelets: 60 10*3/uL — ABNORMAL LOW (ref 150–400)
RBC: 3.87 MIL/uL (ref 3.87–5.11)
RDW: 13.9 % (ref 11.5–15.5)
WBC: 2.6 10*3/uL — ABNORMAL LOW (ref 4.0–10.5)
nRBC: 0 % (ref 0.0–0.2)

## 2020-02-07 LAB — BASIC METABOLIC PANEL
Anion gap: 8 (ref 5–15)
BUN: 8 mg/dL (ref 6–20)
CO2: 23 mmol/L (ref 22–32)
Calcium: 9.5 mg/dL (ref 8.9–10.3)
Chloride: 108 mmol/L (ref 98–111)
Creatinine, Ser: 0.65 mg/dL (ref 0.44–1.00)
GFR calc Af Amer: >60 mL/min (ref >60)
GFR calc non Af Amer: >60 mL/min (ref >60)
Glucose, Bld: 165 mg/dL — ABNORMAL HIGH (ref 70–99)
Potassium: 3.4 mmol/L — ABNORMAL LOW (ref 3.5–5.1)
Sodium: 139 mmol/L (ref 135–145)

## 2020-02-07 LAB — D-DIMER, QUANTITATIVE: D-Dimer, Quant: 1.61 ug/mL-FEU — ABNORMAL HIGH (ref 0.00–0.50)

## 2020-02-07 LAB — TROPONIN I (HIGH SENSITIVITY)
Troponin I (High Sensitivity): 5 ng/L (ref <18)
Troponin I (High Sensitivity): 4 ng/L (ref <18)

## 2020-02-07 MED ORDER — LIDOCAINE VISCOUS HCL 2 % MT SOLN
15.0000 mL | Freq: Once | OROMUCOSAL | Status: AC
  Administered 2020-02-07: 15 mL via ORAL
  Filled 2020-02-07: qty 15

## 2020-02-07 MED ORDER — NITROGLYCERIN 0.4 MG SL SUBL
0.4000 mg | SUBLINGUAL_TABLET | SUBLINGUAL | Status: DC | PRN
  Administered 2020-02-07: 0.4 mg via SUBLINGUAL
  Filled 2020-02-07: qty 1

## 2020-02-07 MED ORDER — ASPIRIN 81 MG PO CHEW
324.0000 mg | CHEWABLE_TABLET | Freq: Once | ORAL | Status: AC
  Administered 2020-02-07: 324 mg via ORAL
  Filled 2020-02-07: qty 4

## 2020-02-07 MED ORDER — ONDANSETRON HCL 4 MG/2ML IJ SOLN
4.0000 mg | Freq: Once | INTRAMUSCULAR | Status: AC
  Administered 2020-02-07: 4 mg via INTRAVENOUS
  Filled 2020-02-07: qty 2

## 2020-02-07 MED ORDER — IOHEXOL 350 MG/ML SOLN
75.0000 mL | Freq: Once | INTRAVENOUS | Status: AC | PRN
  Administered 2020-02-07: 75 mL via INTRAVENOUS

## 2020-02-07 MED ORDER — ALUM & MAG HYDROXIDE-SIMETH 200-200-20 MG/5ML PO SUSP
15.0000 mL | Freq: Once | ORAL | Status: AC
  Administered 2020-02-07: 15 mL via ORAL
  Filled 2020-02-07: qty 30

## 2020-02-07 NOTE — ED Provider Notes (Signed)
Blood pressure (!) 150/84, pulse 85, temperature 98.2 F (36.8 C), resp. rate 16, height 5\' 4"  (1.626 m), weight (!) 113.4 kg, SpO2 97 %.  Assuming care from Dr. .  In short, Candice Stanley is a 54 y.o. female with a chief complaint of Chest Pain .  Refer to the original H&P for additional details.  The current plan of care is to f/u on CTA and reassess.   EKG Interpretation  Date/Time:  Friday February 07 2020 04:47:05 EDT Ventricular Rate:  87 PR Interval:    QRS Duration: 95 QT Interval:  368 QTC Calculation: 443 R Axis:   107 Text Interpretation: Sinus rhythm Low voltage, precordial leads Right ventricular hypertrophy When compared with ECG of 09/25/2019, No significant change was found Confirmed by 09/27/2019 (Dione Booze) on 02/07/2020 4:53:35 AM      07:53 AM  CTA negative for PE or other acute process. Second troponin in process.   08:24 AM  Second troponin is negative.  Went back to evaluate the patient.  She notes some swelling in the arms and legs as well.  We discussed low-salt/DASH diet options.  Will place a referral in our system to the cardiology team to have her reestablish care there.  She is discussing her thrombocytopenia and leukopenia with her PCP who has discussed possible hematology referral in the future.  Advise she follow closely with her PCP as well.  Discussed ED return precautions in detail.   02/09/2020, MD 02/07/20 [phone]

## 2020-02-07 NOTE — Discharge Instructions (Signed)
You were seen in the emergency room today with chest pain and swelling.  Your lab work showed similar decrease in your white blood cells and platelets.  Please discuss this with your primary care doctor to decide on further work-up as an outpatient.  I have placed a referral to the cardiology group locally.  Please call to confirm your follow-up appointment.  In the meantime please monitor your symptoms closely.  I have attached information regarding the DASHdiet which is a low salt diet which can help with some of your swelling.  The cardiologist can advise you further on diet changes and drive further work-up as needed regarding your chest pain symptoms.   If you develop new or suddenly worsening chest pain, shortness of breath, lightheadedness please call 911 or return to the emergency department immediately.

## 2020-02-07 NOTE — ED Provider Notes (Signed)
[facility] EMERGENCY DEPARTMENT Provider Note   CSN: [id number] Arrival date & time: 02/07/20  0435    History Chief Complaint  Patient presents with  . Chest Pain    Candice Stanley is a 54 y.o. female.  The history is provided by the patient.  Chest Pain She has history of hypertension, diabetes, hyperlipidemia and comes in because of pain in her chest and left arm.  She states that her left arm felt a little uncomfortable when she went to bed, but she was awakened by severe pain in the left arm which she describes as a throbbing pain, and a heavy feeling in her chest.  There is associated dyspnea, nausea, diaphoresis.  She has noted that she gets short of breath with exertion, but walking did not necessarily make her symptoms worse.  She has not taken anything for the pain.  Nothing seems to make it better, nothing makes it worse.  She is a non-smoker.  There is a strong family history of premature coronary atherosclerosis.  She states that she is not currently getting any treatment for blood pressure or diabetes or hyperlipidemia.  Of note, she did have a long car ride about 4 weeks ago.  Past Medical History:  Diagnosis Date  . Anxiety   . Depression   . GERD (gastroesophageal reflux disease)   . Hypercholesteremia   . Hypertension   . Hypothyroidism   . Trimalleolar fracture of ankle, closed, right, sequela   . Type 2 diabetes mellitus [facility])     Patient Active Problem List   Diagnosis Date Noted  . Hypothyroidism 06/05/2018  . Type 2 diabetes mellitus (HCC) 06/05/2018  . Hypophosphatemia 06/05/2018  . Depression with anxiety 06/05/2018  . Thrombocytopenia (HCC) 06/05/2018  . Leukopenia 06/05/2018  . Hypokalemia 06/05/2018  . Chronic cough 12/06/2016  . Precordial pain   . Family history of early CAD   . Chest pain 09/02/2015  . Dysphagia 04/16/2012  . Odynophagia 04/16/2012  . Unspecified hypothyroidism 11/03/2009  . Hyperlipidemia 11/03/2009  . OBESITY,  UNSPECIFIED 11/03/2009  . DYSTHYMIC DISORDER 11/03/2009  . Esophageal reflux 11/03/2009  . Diaphragmatic hernia 11/03/2009  . CHEST PAIN UNSPECIFIED 11/03/2009    Past Surgical History:  Procedure Laterality Date  . ABDOMINAL HYSTERECTOMY    . CARDIAC CATHETERIZATION N/A 09/10/2015   Procedure: Left Heart Cath and Coronary Angiography;  Surgeon: Corky Crafts, MD;  Location: [facility] INVASIVE CV LAB;  Service: Cardiovascular;  Laterality: N/A;  . CHOLECYSTECTOMY    . OPEN REDUCTION INTERNAL FIXATION (ORIF) TIBIA/FIBULA FRACTURE Right 08/04/2016   Procedure: OPEN REDUCTION INTERNAL FIXATION (ORIF) RIGHT TRIMALLEOLAR FRACTURE;  Surgeon: Toni Arthurs, MD;  Location: [facility];  Service: Orthopedics;  Laterality: Right;  . WRIST SURGERY       OB History   No obstetric history on file.     Family History  Problem Relation Age of Onset  . Coronary artery disease Father   . Heart disease Father   . Barrett's esophagus Father   . Hypertension Mother   . Coronary artery disease Paternal Uncle   . Coronary artery disease Cousin     Social History   Tobacco Use  . Smoking status: Former Smoker    Types: Cigarettes    Quit date: 05/20/1991    Years since quitting: 28.7  . Smokeless tobacco: Never Used  . Tobacco comment: Used to smoke a pack in 1-2 weeks/ 1992  Vaping Use  . Vaping Use: Never used  Substance Use Topics  . Alcohol use: Not Currently    Alcohol/week: 0.0 standard drinks    Comment: social  . Drug use: No    Home Medications Prior to Admission medications   Medication Sig Start Date End Date Taking? Authorizing Provider  ALPRAZolam Prudy Feeler) 0.5 MG tablet Take 0.25-0.5 mg by mouth daily as needed for anxiety.     [provider]  Aspirin-Acetaminophen-Caffeine (GOODY HEADACHE PO) Take 1 packet by mouth daily as needed (for pain/headache).    [provider]  dexlansoprazole (DEXILANT) 60 MG capsule Take 1 capsule (60 mg total) by  mouth daily. 04/16/12   Joselyn Arrow, NP  famotidine (PEPCID) 20 MG tablet Take 20 mg by mouth at bedtime.    [provider]  FLUoxetine (PROZAC) 40 MG capsule Take 40 mg by mouth daily. 08/15/19   [provider]  loratadine (CLARITIN) 10 MG tablet Take 10 mg by mouth daily.    [provider]  Multiple Vitamins-Minerals (ZINC PO) Take 1 tablet by mouth daily.    [provider]  vitamin C (ASCORBIC ACID) 500 MG tablet Take 500 mg by mouth daily.     [provider]    Allergies    Codeine, Dilaudid [hydromorphone hcl], Tape, Vicodin [hydrocodone-acetaminophen], and Latex  Review of Systems   Review of Systems  Cardiovascular: Positive for chest pain.  All other systems reviewed and are negative.   Physical Exam Updated Vital Signs BP (!) 155/70 (BP Location: Right Arm)   Pulse 87   Temp 98.2 F (36.8 C)   Resp 13   Ht 5\' 4"  (1.626 m)   Wt (!) 113.4 kg   SpO2 97%   BMI 42.91 kg/m   Physical Exam Vitals and nursing note reviewed.   Morbidly obese 54 year old female, resting comfortably and in no acute distress. Vital signs are significant for elevated blood pressure. Oxygen saturation is 97%, which is normal. Head is normocephalic and atraumatic. PERRLA, EOMI. Oropharynx is clear. Neck is nontender and supple without adenopathy or JVD. Back is nontender and there is no CVA tenderness. Lungs are clear without rales, wheezes, or rhonchi. Chest is mildly tender in the left anterior chest wall. Heart has regular rate and rhythm without murmur. Abdomen is soft, flat, nontender without masses or hepatosplenomegaly and peristalsis is normoactive. Extremities have trace edema, full range of motion is present. Skin is warm and dry without rash. Neurologic: Mental status is normal, cranial nerves are intact, there are no motor or sensory deficits.  ED Results / Procedures / Treatments   Labs (all labs ordered are listed, but only  abnormal results are displayed) Labs Reviewed - No data to display  EKG EKG Interpretation  Date/Time:  Friday February 07 2020 04:47:05 EDT Ventricular Rate:  87 PR Interval:    QRS Duration: 95 QT Interval:  368 QTC Calculation: 443 R Axis:   107 Text Interpretation: Sinus rhythm Low voltage, precordial leads Right ventricular hypertrophy When compared with ECG of 09/25/2019, No significant change was found Confirmed by 09/27/2019 (Dione Booze) on 02/07/2020 4:53:35 AM   Radiology No results found.  Procedures Procedures   Medications Ordered in ED Medications - No data to display  ED Course  I have reviewed the triage vital signs and the nursing notes.  Pertinent labs & imaging results that were available during my care of the patient were reviewed by me and considered in my medical decision making (see chart for details).  MDM  Rules/Calculators/A&P Chest pain of uncertain cause.  Old records are reviewed, and she had a cardiac catheterization in 2017 which showed clean coronary arteries.  This makes ACS somewhat unlikely.  She does have a history of recent travel, so we will need to screen for pulmonary embolism with D-dimer.  She will be given therapeutic trial of nitroglycerin and will be given oral aspirin as well as intravenous ondansetron.  She feels significantly better following nitroglycerin.  Labs are significant for elevated D-dimer.  Also, leukopenia and thrombocytopenia which are unchanged from baseline.  Troponin is normal.  She is being sent for CT angiogram of the chest.  Case is signed out to Dr. Jacqulyn Bath.  Final Clinical Impression(s) / ED Diagnoses Final diagnoses:  Atypical chest pain    Rx / DC Orders ED Discharge Orders    None       Dione Booze, MD 02/07/20 [phone]

## 2020-02-07 NOTE — ED Triage Notes (Signed)
Pt states chest pain started about 2000 last night. PT states she took tums with little relief. Pt states then she woke up with severe chest pain about 0400 this morning.

## 2020-02-10 ENCOUNTER — Telehealth: Payer: Self-pay | Admitting: Internal Medicine

## 2020-02-10 NOTE — Telephone Encounter (Signed)
New Message  New Patient appointment scheduled with Dr. Jacques Navy at 1:20 pm.

## 2020-02-11 ENCOUNTER — Other Ambulatory Visit: Payer: Self-pay

## 2020-02-11 ENCOUNTER — Encounter: Payer: Self-pay | Admitting: Internal Medicine

## 2020-02-11 ENCOUNTER — Ambulatory Visit (INDEPENDENT_AMBULATORY_CARE_PROVIDER_SITE_OTHER): Payer: Self-pay | Admitting: Internal Medicine

## 2020-02-11 VITALS — BP 164/96 | HR 76 | Ht 64.0 in | Wt 287.2 lb

## 2020-02-11 DIAGNOSIS — R400 Somnolence: Secondary | ICD-10-CM

## 2020-02-11 DIAGNOSIS — R072 Precordial pain: Secondary | ICD-10-CM

## 2020-02-11 DIAGNOSIS — R0683 Snoring: Secondary | ICD-10-CM

## 2020-02-11 NOTE — Patient Instructions (Addendum)
Medication Instructions:  No Changes *If you need a refill on your cardiac medications before your next appointment, please call your pharmacy*   Lab Work: None Ordered If you have labs (blood work) drawn today and your tests are completely normal, you will receive your results only by: Marland Kitchen MyChart Message (if you have MyChart) OR . A paper copy in the mail If you have any lab test that is abnormal or we need to change your treatment, we will call you to review the results.   Testing/Procedures: This will take place at [address] [place]. Your physician has requested that you have an echocardiogram. Echocardiography is a painless test that uses sound waves to create images of your heart. It provides your doctor with information about the size and shape of your heart and how well your heart's chambers and valves are working. This procedure takes approximately one hour. There are no restrictions for this procedure.  This will take place at [facility]. This will be scheduled after authorization is given by the insurance company. Your physician has recommended that you have a sleep study. This test records several body functions during sleep, including: brain activity, eye movement, oxygen and carbon dioxide blood levels, heart rate and rhythm, breathing rate and rhythm, the flow of air through your mouth and nose, snoring, body muscle movements, and chest and belly movement.    Follow-Up: At [facility], you and your health needs are our priority.  As part of our continuing mission to provide you with exceptional heart care, we have created designated Provider Care Teams.  These Care Teams include your primary Cardiologist (physician) and Advanced Practice Providers (APPs -  Physician Assistants and Nurse Practitioners) who all work together to provide you with the care you need, when you need it.   Your next appointment:   1 month(s) To be Seen after the  Echo is completed   The format for your next appointment:   In Person  Provider:   Weston Brass, MD   Other Instructions          Mediterranean Diet  Why follow it? Research shows. . Those who follow the Mediterranean diet have a reduced risk of heart disease  . The diet is associated with a reduced incidence of Parkinson's and Alzheimer's diseases . People following the diet may have longer life expectancies and lower rates of chronic diseases  . The Dietary Guidelines for Americans recommends the Mediterranean diet as an eating plan to promote health and prevent disease  What Is the Mediterranean Diet?  . Healthy eating plan based on typical foods and recipes of Mediterranean-style cooking . The diet is primarily a plant based diet; these foods should make up a majority of meals   Starches - Plant based foods should make up a majority of meals - They are an important sources of vitamins, minerals, energy, antioxidants, and fiber - Choose whole grains, foods high in fiber and minimally processed items  - Typical grain sources include wheat, oats, barley, corn, brown rice, bulgar, farro, millet, polenta, couscous  - Various types of beans include chickpeas, lentils, fava beans, black beans, white beans   Fruits  Veggies - Large quantities of antioxidant rich fruits & veggies; 6 or more servings  - Vegetables can be eaten raw or lightly drizzled with oil and cooked  - Vegetables common to the traditional Mediterranean Diet include: artichokes, arugula, beets, broccoli, brussel sprouts, cabbage, carrots, celery, collard greens,  cucumbers, eggplant, kale, leeks, lemons, lettuce, mushrooms, okra, onions, peas, peppers, potatoes, pumpkin, radishes, rutabaga, shallots, spinach, sweet potatoes, turnips, zucchini - Fruits common to the Mediterranean Diet include: apples, apricots, avocados, cherries, clementines, dates, figs, grapefruits, grapes, melons, nectarines, oranges, peaches,  pears, pomegranates, strawberries, tangerines  Fats - Replace butter and margarine with healthy oils, such as olive oil, canola oil, and tahini  - Limit nuts to no more than a handful a day  - Nuts include walnuts, almonds, pecans, pistachios, pine nuts  - Limit or avoid candied, honey roasted or heavily salted nuts - Olives are central to the [employer] - can be eaten whole or used in a variety of dishes   Meats Protein - Limiting red meat: no more than a few times a month - When eating red meat: choose lean cuts and keep the portion to the size of deck of cards - Eggs: approx. 0 to 4 times a week  - Fish and lean poultry: at least 2 a week  - Healthy protein sources include, chicken, [place], lean beef, lamb - Increase intake of seafood such as tuna, salmon, trout, mackerel, shrimp, scallops - Avoid or limit high fat processed meats such as sausage and bacon  Dairy - Include moderate amounts of low fat dairy products  - Focus on healthy dairy such as fat free yogurt, skim milk, low or reduced fat cheese - Limit dairy products higher in fat such as whole or 2% milk, cheese, ice cream  Alcohol - Moderate amounts of red wine is ok  - No more than 5 oz daily for women (all ages) and men older than age 21  - No more than 10 oz of wine daily for men younger than 8  Other - Limit sweets and other desserts  - Use herbs and spices instead of salt to flavor foods  - Herbs and spices common to the traditional Mediterranean Diet include: basil, bay leaves, chives, cloves, cumin, fennel, garlic, lavender, marjoram, mint, oregano, parsley, pepper, rosemary, sage, savory, sumac, tarragon, thyme   It's not just a diet, it's a lifestyle:  . The Mediterranean diet includes lifestyle factors typical of those in the region  . Foods, drinks and meals are best eaten with others and savored . Daily physical activity is important for overall good health . This could be strenuous exercise like running  and aerobics . This could also be more leisurely activities such as walking, housework, yard-work, or taking the stairs . Moderation is the key; a balanced and healthy diet accommodates most foods and drinks . Consider portion sizes and frequency of consumption of certain foods   Meal Ideas & Options:  . Breakfast:  o Whole wheat toast or whole wheat English muffins with peanut butter & hard boiled egg o Steel cut oats topped with apples & cinnamon and skim milk  o Fresh fruit: banana, strawberries, melon, berries, peaches  o Smoothies: strawberries, bananas, greek yogurt, peanut butter o Low fat greek yogurt with blueberries and granola  o Egg white omelet with spinach and mushrooms o Breakfast couscous: whole wheat couscous, apricots, skim milk, cranberries  . Sandwiches:  o Hummus and grilled vegetables (peppers, zucchini, squash) on whole wheat bread   o Grilled chicken on whole wheat pita with lettuce, tomatoes, cucumbers or tzatziki  o Tuna salad on whole wheat bread: tuna salad made with greek yogurt, olives, red peppers, capers, green onions o Garlic rosemary lamb pita: lamb sauted with garlic, rosemary, salt & pepper;  add lettuce, cucumber, greek yogurt to pita - flavor with lemon juice and black pepper  . Seafood:  o Mediterranean grilled salmon, seasoned with garlic, basil, parsley, lemon juice and black pepper o Shrimp, lemon, and spinach whole-grain pasta salad made with low fat greek yogurt  o Seared scallops with lemon orzo  o Seared tuna steaks seasoned salt, pepper, coriander topped with tomato mixture of olives, tomatoes, olive oil, minced garlic, parsley, green onions and cappers  . Meats:  o Herbed greek chicken salad with kalamata olives, cucumber, feta  o Red bell peppers stuffed with spinach, bulgur, lean ground beef (or lentils) & topped with feta   o Kebabs: skewers of chicken, tomatoes, onions, zucchini, squash  o [place] burgers: made with red onions, mint,  dill, lemon juice, feta cheese topped with roasted red peppers . Vegetarian o Cucumber salad: cucumbers, artichoke hearts, celery, red onion, feta cheese, tossed in olive oil & lemon juice  o Hummus and whole grain pita points with a greek salad (lettuce, tomato, feta, olives, cucumbers, red onion) o Lentil soup with celery, carrots made with vegetable broth, garlic, salt and pepper  o Tabouli salad: parsley, bulgur, mint, scallions, cucumbers, tomato, radishes, lemon juice, olive oil, salt and pepper.

## 2020-02-11 NOTE — Progress Notes (Signed)
Cardiology Office Note:    Date:  02/11/2020   ID:  Candice Stanley, DOB 15-Oct-1965, MRN [id number]  PCP:  Benita Stabile, MD  Cardiologist:  No primary care provider on file.  Electrophysiologist:  None   Referring MD: Maia Plan, MD   Chief Complaint: chest pain, fatigue  History of Present Illness:    Candice Stanley is a 54 y.o. female with a history of HTN, HLD, DM2 who presents for chest pain.   Severe pain in left arm, throbbing, and heavy feeling substernal and left chest for minutes at a time. Dyspnea, nausea and diaphoresis associated. DOE as well. No aggravating or alleviating factors that she has been able to identify. FHX of premature CAD. Symptoms similar in 2017 - Clean cath in 2017. Normal LVEDP.  She also notes excessive daytime fatigue and snoring. Primary concern is lack of energy. She has not yet had an evaluation for sleep apnea.   Past Medical History:  Diagnosis Date  . Anxiety   . Depression   . GERD (gastroesophageal reflux disease)   . Hypercholesteremia   . Hypertension   . Hypothyroidism   . Trimalleolar fracture of ankle, closed, right, sequela   . Type 2 diabetes mellitus (HCC)     Past Surgical History:  Procedure Laterality Date  . ABDOMINAL HYSTERECTOMY    . CARDIAC CATHETERIZATION N/A 09/10/2015   Procedure: Left Heart Cath and Coronary Angiography;  Surgeon: Corky Crafts, MD;  Location: [facility] INVASIVE CV LAB;  Service: Cardiovascular;  Laterality: N/A;  . CHOLECYSTECTOMY    . OPEN REDUCTION INTERNAL FIXATION (ORIF) TIBIA/FIBULA FRACTURE Right 08/04/2016   Procedure: OPEN REDUCTION INTERNAL FIXATION (ORIF) RIGHT TRIMALLEOLAR FRACTURE;  Surgeon: Toni Arthurs, MD;  Location: [facility];  Service: Orthopedics;  Laterality: Right;  . WRIST SURGERY      Current Medications: Current Meds  Medication Sig  . ALPRAZolam (XANAX) 0.5 MG tablet Take 0.25-0.5 mg by mouth daily as needed for anxiety.   . Aspirin-Acetaminophen-Caffeine  (GOODY HEADACHE PO) Take 1 packet by mouth daily as needed (for pain/headache).  Marland Kitchen dexlansoprazole (DEXILANT) 60 MG capsule Take 1 capsule (60 mg total) by mouth daily.  . famotidine (PEPCID) 20 MG tablet Take 20 mg by mouth at bedtime.  Marland Kitchen FLUoxetine (PROZAC) 40 MG capsule Take 40 mg by mouth daily.  Marland Kitchen loratadine (CLARITIN) 10 MG tablet Take 10 mg by mouth daily.  . Multiple Vitamins-Minerals (ZINC PO) Take 1 tablet by mouth daily.  . vitamin C (ASCORBIC ACID) 500 MG tablet Take 500 mg by mouth daily.      Allergies:   Codeine, Dilaudid [hydromorphone hcl], Tape, Vicodin [hydrocodone-acetaminophen], and Latex   Social History   Socioeconomic History  . Marital status: Married    Spouse name: Not on file  . Number of children: 2  . Years of education: Not on file  . Highest education level: Not on file  Occupational History  . Occupation: Orthoptist: [employer]  Tobacco Use  . Smoking status: Former Smoker    Types: Cigarettes    Quit date: 05/20/1991    Years since quitting: 28.8  . Smokeless tobacco: Never Used  . Tobacco comment: Used to smoke a pack in 1-2 weeks/ 1992  Vaping Use  . Vaping Use: Never used  Substance and Sexual Activity  . Alcohol use: Not Currently    Alcohol/week: 0.0 standard drinks    Comment: social  . Drug use: No  .  Sexual activity: Yes    Birth control/protection: Surgical  Other Topics Concern  . Not on file  Social History Narrative  . Not on file   Social Determinants of Health   Financial Resource Strain:   . Difficulty of Paying Living Expenses: Not on file  Food Insecurity:   . Worried About Programme researcher, broadcasting/film/video in the Last Year: Not on file  . Ran Out of Food in the Last Year: Not on file  Transportation Needs:   . Lack of Transportation (Medical): Not on file  . Lack of Transportation (Non-Medical): Not on file  Physical Activity:   . Days of Exercise per Week: Not on file  . Minutes of Exercise per Session:  Not on file  Stress:   . Feeling of Stress : Not on file  Social Connections:   . Frequency of Communication with Friends and Family: Not on file  . Frequency of Social Gatherings with Friends and Family: Not on file  . Attends Religious Services: Not on file  . Active Member of Clubs or Organizations: Not on file  . Attends Banker Meetings: Not on file  . Marital Status: Not on file     Family History: The patient's family history includes Barrett's esophagus in her father; Coronary artery disease in her cousin, father, and paternal uncle; Heart disease in her father; Hypertension in her mother.  ROS:   Please see the history of present illness.    All other systems reviewed and are negative.  EKGs/Labs/Other Studies Reviewed:    The following studies were reviewed today:  EKG:  NSR, rate 66  I have independently reviewed the images from CT angio chest PE 02/07/20. No coronary calcifications.  Recent Labs: 09/25/2019: TSH 3.099 02/07/2020: BUN 8; Creatinine, Ser 0.65; Hemoglobin 12.1; Platelets 60; Potassium 3.4; Sodium 139  Recent Lipid Panel    Component Value Date/Time   CHOL 220 (H) 01/17/2012 0522   TRIG 195 (H) 01/17/2012 0522   HDL 48 01/17/2012 0522   CHOLHDL 4.6 01/17/2012 0522   VLDL 39 01/17/2012 0522   LDLCALC 133 (H) 01/17/2012 0522    Physical Exam:    VS:  BP (!) 164/96 (BP Location: Left Arm, Patient Position: Sitting, Cuff Size: Large)   Pulse 76   Ht 5\' 4"  (1.626 m)   Wt 287 lb 3.2 oz (130.3 kg)   BMI 49.30 kg/m     Wt Readings from Last 5 Encounters:  02/11/20 287 lb 3.2 oz (130.3 kg)  02/07/20 (!) 250 lb (113.4 kg)  09/25/19 275 lb (124.7 kg)  10/16/18 240 lb (108.9 kg)  06/05/18 254 lb 13.6 oz (115.6 kg)     Constitutional: No acute distress Eyes: sclera non-icteric, normal conjunctiva and lids ENMT: normal dentition, moist mucous membranes Cardiovascular: regular rhythm, normal rate, no murmurs. S1 and S2 normal. Radial  pulses normal bilaterally. No jugular venous distention.  Respiratory: clear to auscultation bilaterally GI : normal bowel sounds, soft and nontender. No distention.   MSK: extremities warm, well perfused. No edema.  NEURO: grossly nonfocal exam, moves all extremities. PSYCH: alert and oriented x 3, normal mood and affect.   ASSESSMENT:    1. Precordial pain   2. Daytime sleepiness   3. Snoring    PLAN:    Precordial pain - Plan: EKG 12-Lead, ECHOCARDIOGRAM COMPLETE - Chest pain in setting of normal cath in 2017 with clean coronaries and normal LVEDP. Will perform an echocardiogram to rule out structural issues,  including right heart concerns in setting of possible sleep apnea. Will check RVSP.   Daytime sleepiness - Plan: Split night study Snoring - Plan: Split night study - need sleep study to further evaluate. We discussed benefits of treatment of sleep apnea and natural history of sleep apnea and deleterious effects on heart health at length today.   HTN - BP elevated, not currently on therapy. Will recheck at follow up, if still elevated will discuss treatment.   HLD - elevated lipids from last year with PCP, LDL 142. Recommend starting a statin, needs repeat lipids.   DM2 - last A1C available is 5.6. Review with PCP.   Weston Brass, MD   [facility]    Medication Adjustments/Labs and Tests Ordered: Current medicines are reviewed at length with the patient today.  Concerns regarding medicines are outlined above.  Orders Placed This Encounter  Procedures  . EKG 12-Lead  . ECHOCARDIOGRAM COMPLETE  . Split night study   No orders of the defined types were placed in this encounter.   Patient Instructions   Medication Instructions:  No Changes *If you need a refill on your cardiac medications before your next appointment, please call your pharmacy*   Lab Work: None Ordered If you have labs (blood work) drawn today and your tests are completely  normal, you will receive your results only by: Marland Kitchen MyChart Message (if you have MyChart) OR . A paper copy in the mail If you have any lab test that is abnormal or we need to change your treatment, we will call you to review the results.   Testing/Procedures: This will take place at [address] [place]. Your physician has requested that you have an echocardiogram. Echocardiography is a painless test that uses sound waves to create images of your heart. It provides your doctor with information about the size and shape of your heart and how well your heart's chambers and valves are working. This procedure takes approximately one hour. There are no restrictions for this procedure.  This will take place at [facility]. This will be scheduled after authorization is given by the insurance company. Your physician has recommended that you have a sleep study. This test records several body functions during sleep, including: brain activity, eye movement, oxygen and carbon dioxide blood levels, heart rate and rhythm, breathing rate and rhythm, the flow of air through your mouth and nose, snoring, body muscle movements, and chest and belly movement.    Follow-Up: At [facility], you and your health needs are our priority.  As part of our continuing mission to provide you with exceptional heart care, we have created designated Provider Care Teams.  These Care Teams include your primary Cardiologist (physician) and Advanced Practice Providers (APPs -  Physician Assistants and Nurse Practitioners) who all work together to provide you with the care you need, when you need it.   Your next appointment:   1 month(s) To be Seen after the Echo is completed   The format for your next appointment:   In Person  Provider:   Weston Brass, MD   Other Instructions          Mediterranean Diet  Why follow it? Research shows. . Those who follow the Mediterranean diet have  a reduced risk of heart disease  . The diet is associated with a reduced incidence of Parkinson's and Alzheimer's diseases . People following the diet may have longer life expectancies and lower rates  of chronic diseases  . The Dietary Guidelines for Americans recommends the Mediterranean diet as an eating plan to promote health and prevent disease  What Is the Mediterranean Diet?  . Healthy eating plan based on typical foods and recipes of Mediterranean-style cooking . The diet is primarily a plant based diet; these foods should make up a majority of meals   Starches - Plant based foods should make up a majority of meals - They are an important sources of vitamins, minerals, energy, antioxidants, and fiber - Choose whole grains, foods high in fiber and minimally processed items  - Typical grain sources include wheat, oats, barley, corn, brown rice, bulgar, farro, millet, polenta, couscous  - Various types of beans include chickpeas, lentils, fava beans, black beans, white beans   Fruits  Veggies - Large quantities of antioxidant rich fruits & veggies; 6 or more servings  - Vegetables can be eaten raw or lightly drizzled with oil and cooked  - Vegetables common to the traditional Mediterranean Diet include: artichokes, arugula, beets, broccoli, brussel sprouts, cabbage, carrots, celery, collard greens, cucumbers, eggplant, kale, leeks, lemons, lettuce, mushrooms, okra, onions, peas, peppers, potatoes, pumpkin, radishes, rutabaga, shallots, spinach, sweet potatoes, turnips, zucchini - Fruits common to the Mediterranean Diet include: apples, apricots, avocados, cherries, clementines, dates, figs, grapefruits, grapes, melons, nectarines, oranges, peaches, pears, pomegranates, strawberries, tangerines  Fats - Replace butter and margarine with healthy oils, such as olive oil, canola oil, and tahini  - Limit nuts to no more than a handful a day  - Nuts include walnuts, almonds, pecans, pistachios, pine  nuts  - Limit or avoid candied, honey roasted or heavily salted nuts - Olives are central to the [employer] diet - can be eaten whole or used in a variety of dishes   Meats Protein - Limiting red meat: no more than a few times a month - When eating red meat: choose lean cuts and keep the portion to the size of deck of cards - Eggs: approx. 0 to 4 times a week  - Fish and lean poultry: at least 2 a week  - Healthy protein sources include, chicken, [place], lean beef, lamb - Increase intake of seafood such as tuna, salmon, trout, mackerel, shrimp, scallops - Avoid or limit high fat processed meats such as sausage and bacon  Dairy - Include moderate amounts of low fat dairy products  - Focus on healthy dairy such as fat free yogurt, skim milk, low or reduced fat cheese - Limit dairy products higher in fat such as whole or 2% milk, cheese, ice cream  Alcohol - Moderate amounts of red wine is ok  - No more than 5 oz daily for women (all ages) and men older than age 56  - No more than 10 oz of wine daily for men younger than 1365  Other - Limit sweets and other desserts  - Use herbs and spices instead of salt to flavor foods  - Herbs and spices common to the traditional Mediterranean Diet include: basil, bay leaves, chives, cloves, cumin, fennel, garlic, lavender, marjoram, mint, oregano, parsley, pepper, rosemary, sage, savory, sumac, tarragon, thyme   It's not just a diet, it's a lifestyle:  . The Mediterranean diet includes lifestyle factors typical of those in the region  . Foods, drinks and meals are best eaten with others and savored . Daily physical activity is important for overall good health . This could be strenuous exercise like running and aerobics . This could also be more  leisurely activities such as walking, housework, yard-work, or taking the stairs . Moderation is the key; a balanced and healthy diet accommodates most foods and drinks . Consider portion sizes and frequency of  consumption of certain foods   Meal Ideas & Options:  . Breakfast:  o Whole wheat toast or whole wheat English muffins with peanut butter & hard boiled egg o Steel cut oats topped with apples & cinnamon and skim milk  o Fresh fruit: banana, strawberries, melon, berries, peaches  o Smoothies: strawberries, bananas, greek yogurt, peanut butter o Low fat greek yogurt with blueberries and granola  o Egg white omelet with spinach and mushrooms o Breakfast couscous: whole wheat couscous, apricots, skim milk, cranberries  . Sandwiches:  o Hummus and grilled vegetables (peppers, zucchini, squash) on whole wheat bread   o Grilled chicken on whole wheat pita with lettuce, tomatoes, cucumbers or tzatziki  o Tuna salad on whole wheat bread: tuna salad made with greek yogurt, olives, red peppers, capers, green onions o Garlic rosemary lamb pita: lamb sauted with garlic, rosemary, salt & pepper; add lettuce, cucumber, greek yogurt to pita - flavor with lemon juice and black pepper  . Seafood:  o Mediterranean grilled salmon, seasoned with garlic, basil, parsley, lemon juice and black pepper o Shrimp, lemon, and spinach whole-grain pasta salad made with low fat greek yogurt  o Seared scallops with lemon orzo  o Seared tuna steaks seasoned salt, pepper, coriander topped with tomato mixture of olives, tomatoes, olive oil, minced garlic, parsley, green onions and cappers  . Meats:  o Herbed greek chicken salad with kalamata olives, cucumber, feta  o Red bell peppers stuffed with spinach, bulgur, lean ground beef (or lentils) & topped with feta   o Kebabs: skewers of chicken, tomatoes, onions, zucchini, squash  o [place] burgers: made with red onions, mint, dill, lemon juice, feta cheese topped with roasted red peppers . Vegetarian o Cucumber salad: cucumbers, artichoke hearts, celery, red onion, feta cheese, tossed in olive oil & lemon juice  o Hummus and whole grain pita points with a greek salad  (lettuce, tomato, feta, olives, cucumbers, red onion) o Lentil soup with celery, carrots made with vegetable broth, garlic, salt and pepper  o Tabouli salad: parsley, bulgur, mint, scallions, cucumbers, tomato, radishes, lemon juice, olive oil, salt and pepper.

## 2020-02-26 ENCOUNTER — Other Ambulatory Visit (HOSPITAL_COMMUNITY): Payer: Self-pay

## 2020-03-09 ENCOUNTER — Other Ambulatory Visit (HOSPITAL_COMMUNITY): Payer: Self-pay

## 2020-03-10 ENCOUNTER — Ambulatory Visit (HOSPITAL_COMMUNITY): Payer: Self-pay | Attending: Cardiology

## 2020-03-10 ENCOUNTER — Other Ambulatory Visit: Payer: Self-pay

## 2020-03-10 DIAGNOSIS — R072 Precordial pain: Secondary | ICD-10-CM

## 2020-03-10 LAB — ECHOCARDIOGRAM COMPLETE
AR max vel: 2.12 cm2
AV Area VTI: 1.91 cm2
AV Area mean vel: 2.18 cm2
AV Mean grad: 7 mmHg
AV Peak grad: 17.1 mmHg
Ao pk vel: 2.07 m/s
Area-P 1/2: 3.6 cm2
S' Lateral: 3.3 cm

## 2020-03-11 ENCOUNTER — Other Ambulatory Visit: Payer: Self-pay

## 2020-03-11 DIAGNOSIS — R072 Precordial pain: Secondary | ICD-10-CM

## 2020-03-30 ENCOUNTER — Ambulatory Visit: Payer: Self-pay | Admitting: Internal Medicine

## 2020-04-10 ENCOUNTER — Telehealth: Payer: Self-pay

## 2020-04-10 ENCOUNTER — Ambulatory Visit: Payer: Self-pay | Admitting: Internal Medicine

## 2020-04-10 NOTE — Telephone Encounter (Signed)
Called patient in regards to rescheduling appointment from today, patient had already rescheduled for 10/18. Also spoke with patient about getting sleep study rescheduled and done prior to next appointment with Dr. Jacques Navy. Advised patient I would forward this message scheduling to try and get this done. Patient verbalized understanding.

## 2020-04-27 ENCOUNTER — Ambulatory Visit: Payer: Self-pay | Admitting: Internal Medicine

## 2020-05-29 ENCOUNTER — Telehealth: Payer: Self-pay | Admitting: *Deleted

## 2020-05-29 NOTE — Telephone Encounter (Signed)
Patient is scheduled for lab study on 06/11/20. Patient understands her sleep study will be done at [facility] sleep lab. Patient understands she will receive a sleep packet in a week or so. Patient understands to call if she does not receive the sleep packet in a timely manner. Per dpr Left detailed message on voicemail with date and time of titration and informed patient to call back to confirm or reschedule.

## 2020-07-11 ENCOUNTER — Emergency Department (HOSPITAL_COMMUNITY)
Admission: EM | Admit: 2020-07-11 | Discharge: 2020-07-11 | Disposition: A | Discharge status: Home / Self Care | Payer: Self-pay | Attending: Emergency Medicine | Admitting: Emergency Medicine

## 2020-07-11 ENCOUNTER — Encounter (HOSPITAL_COMMUNITY): Payer: Self-pay | Admitting: Emergency Medicine

## 2020-07-11 ENCOUNTER — Emergency Department (HOSPITAL_COMMUNITY): Payer: Self-pay

## 2020-07-11 ENCOUNTER — Other Ambulatory Visit: Payer: Self-pay

## 2020-07-11 DIAGNOSIS — E039 Hypothyroidism, unspecified: Secondary | ICD-10-CM | POA: Insufficient documentation

## 2020-07-11 DIAGNOSIS — E119 Type 2 diabetes mellitus without complications: Secondary | ICD-10-CM | POA: Insufficient documentation

## 2020-07-11 DIAGNOSIS — Z20822 Contact with and (suspected) exposure to covid-19: Secondary | ICD-10-CM | POA: Insufficient documentation

## 2020-07-11 DIAGNOSIS — Z87891 Personal history of nicotine dependence: Secondary | ICD-10-CM | POA: Insufficient documentation

## 2020-07-11 DIAGNOSIS — Z79899 Other long term (current) drug therapy: Secondary | ICD-10-CM | POA: Insufficient documentation

## 2020-07-11 DIAGNOSIS — R0789 Other chest pain: Secondary | ICD-10-CM | POA: Insufficient documentation

## 2020-07-11 DIAGNOSIS — Z91040 Latex allergy status: Secondary | ICD-10-CM | POA: Insufficient documentation

## 2020-07-11 DIAGNOSIS — I10 Essential (primary) hypertension: Secondary | ICD-10-CM | POA: Insufficient documentation

## 2020-07-11 DIAGNOSIS — Z7982 Long term (current) use of aspirin: Secondary | ICD-10-CM | POA: Insufficient documentation

## 2020-07-11 LAB — CBG MONITORING, ED: Glucose-Capillary: 223 mg/dL — ABNORMAL HIGH (ref 70–99)

## 2020-07-11 LAB — BASIC METABOLIC PANEL
Anion gap: 5 (ref 5–15)
BUN: 8 mg/dL (ref 6–20)
CO2: 23 mmol/L (ref 22–32)
Calcium: 9.2 mg/dL (ref 8.9–10.3)
Chloride: 108 mmol/L (ref 98–111)
Creatinine, Ser: 0.57 mg/dL (ref 0.44–1.00)
GFR, Estimated: >60 mL/min (ref >60)
Glucose, Bld: 223 mg/dL — ABNORMAL HIGH (ref 70–99)
Potassium: 3.4 mmol/L — ABNORMAL LOW (ref 3.5–5.1)
Sodium: 136 mmol/L (ref 135–145)

## 2020-07-11 LAB — SARS CORONAVIRUS 2 (TAT 6-24 HRS): SARS Coronavirus 2: NEGATIVE

## 2020-07-11 LAB — CBC
HCT: 36.9 % (ref 36.0–46.0)
Hemoglobin: 12.5 g/dL (ref 12.0–15.0)
MCH: 31.6 pg (ref 26.0–34.0)
MCHC: 33.9 g/dL (ref 30.0–36.0)
MCV: 93.2 fL (ref 80.0–100.0)
Platelets: 64 10*3/uL — ABNORMAL LOW (ref 150–400)
RBC: 3.96 MIL/uL (ref 3.87–5.11)
RDW: 13.9 % (ref 11.5–15.5)
WBC: 2.7 10*3/uL — ABNORMAL LOW (ref 4.0–10.5)
nRBC: 0 % (ref 0.0–0.2)

## 2020-07-11 LAB — TROPONIN I (HIGH SENSITIVITY)
Troponin I (High Sensitivity): 5 ng/L (ref <18)
Troponin I (High Sensitivity): 5 ng/L (ref <18)

## 2020-07-11 LAB — D-DIMER, QUANTITATIVE: D-Dimer, Quant: 1.16 ug/mL-FEU — ABNORMAL HIGH (ref 0.00–0.50)

## 2020-07-11 MED ORDER — HYDROCODONE-ACETAMINOPHEN 5-325 MG PO TABS: 1.0000 | ORAL_TABLET | Freq: Four times a day (QID) | ORAL | 0 refills | Status: DC | PRN

## 2020-07-11 MED ORDER — ONDANSETRON HCL 4 MG/2ML IJ SOLN
4.0000 mg | Freq: Once | INTRAMUSCULAR | Status: AC
  Administered 2020-07-11: 4 mg via INTRAVENOUS
  Filled 2020-07-11: qty 2

## 2020-07-11 MED ORDER — MORPHINE SULFATE (PF) 2 MG/ML IV SOLN
2.0000 mg | Freq: Once | INTRAVENOUS | Status: AC
  Administered 2020-07-11: 2 mg via INTRAVENOUS
  Filled 2020-07-11: qty 1

## 2020-07-11 MED ORDER — IOHEXOL 350 MG/ML SOLN
75.0000 mL | Freq: Once | INTRAVENOUS | Status: AC | PRN
  Administered 2020-07-11: 75 mL via INTRAVENOUS

## 2020-07-11 NOTE — Discharge Instructions (Addendum)
Follow-up with your doctor next week if any problems 

## 2020-07-11 NOTE — ED Provider Notes (Signed)
[facility] EMERGENCY DEPARTMENT Provider Note   CSN: [id number] Arrival date & time: 07/11/20  0556     History Chief Complaint  Patient presents with   Chest Pain    Candice Stanley is a 55 y.o. female.  Pt complains of chest pain in the left chest.  No fevers no chills no cough no shortness of breath  The history is provided by the patient and medical records. No language interpreter was used.  Chest Pain Pain location:  L chest Pain quality: aching   Pain radiates to:  Does not radiate Pain severity:  Moderate Onset quality:  Sudden Timing:  Intermittent Progression:  Waxing and waning Chronicity:  New Context: not breathing   Relieved by:  Nothing Worsened by:  Nothing Ineffective treatments:  None tried Associated symptoms: no abdominal pain, no back pain, no cough, no fatigue and no headache        Past Medical History:  Diagnosis Date   Anxiety    Depression    GERD (gastroesophageal reflux disease)    Hypercholesteremia    Hypertension    Hypothyroidism    Trimalleolar fracture of ankle, closed, right, sequela    Type 2 diabetes mellitus (HCC)     Patient Active Problem List   Diagnosis Date Noted   Hypothyroidism 06/05/2018   Type 2 diabetes mellitus (HCC) 06/05/2018   Hypophosphatemia 06/05/2018   Depression with anxiety 06/05/2018   Thrombocytopenia (HCC) 06/05/2018   Leukopenia 06/05/2018   Hypokalemia 06/05/2018   Chronic cough 12/06/2016   Precordial pain    Family history of early CAD    Chest pain 09/02/2015   Dysphagia 04/16/2012   Odynophagia 04/16/2012   Unspecified hypothyroidism 11/03/2009   Hyperlipidemia 11/03/2009   OBESITY, UNSPECIFIED 11/03/2009   DYSTHYMIC DISORDER 11/03/2009   Esophageal reflux 11/03/2009   Diaphragmatic hernia 11/03/2009   CHEST PAIN UNSPECIFIED 11/03/2009    Past Surgical History:  Procedure Laterality Date   ABDOMINAL HYSTERECTOMY     CARDIAC CATHETERIZATION N/A  09/10/2015   Procedure: Left Heart Cath and Coronary Angiography;  Surgeon: Corky Crafts, MD;  Location: [facility] INVASIVE CV LAB;  Service: Cardiovascular;  Laterality: N/A;   CHOLECYSTECTOMY     OPEN REDUCTION INTERNAL FIXATION (ORIF) TIBIA/FIBULA FRACTURE Right 08/04/2016   Procedure: OPEN REDUCTION INTERNAL FIXATION (ORIF) RIGHT TRIMALLEOLAR FRACTURE;  Surgeon: Toni Arthurs, MD;  Location: [facility];  Service: Orthopedics;  Laterality: Right;   WRIST SURGERY       OB History   No obstetric history on file.     Family History  Problem Relation Age of Onset   Coronary artery disease Father    Heart disease Father    Barrett's esophagus Father    Hypertension Mother    Coronary artery disease Paternal Uncle    Coronary artery disease Cousin     Social History   Tobacco Use   Smoking status: Former Smoker    Types: Cigarettes    Quit date: 05/20/1991    Years since quitting: 29.1   Smokeless tobacco: Never Used   Tobacco comment: Used to smoke a pack in 1-2 weeks/ 1992  Vaping Use   Vaping Use: Never used  Substance Use Topics   Alcohol use: Not Currently    Alcohol/week: 0.0 standard drinks    Comment: social   Drug use: No    Home Medications Prior to Admission medications   Medication Sig Start Date End Date Taking? Authorizing Provider  HYDROcodone-acetaminophen (NORCO/VICODIN) 5-325 MG  tablet Take 1 tablet by mouth every 6 (six) hours as needed. 07/11/20  Yes Milton Ferguson, MD  ALPRAZolam Duanne Moron) 0.5 MG tablet Take 0.25-0.5 mg by mouth daily as needed for anxiety.     [provider]  Aspirin-Acetaminophen-Caffeine (GOODY HEADACHE PO) Take 1 packet by mouth daily as needed (for pain/headache).    [provider]  dexlansoprazole (DEXILANT) 60 MG capsule Take 1 capsule (60 mg total) by mouth daily. 04/16/12   Andria Meuse, NP  famotidine (PEPCID) 20 MG tablet Take 20 mg by mouth at bedtime.    [provider]   FLUoxetine (PROZAC) 40 MG capsule Take 40 mg by mouth daily. 08/15/19   [provider]  loratadine (CLARITIN) 10 MG tablet Take 10 mg by mouth daily.    [provider]  Multiple Vitamins-Minerals (ZINC PO) Take 1 tablet by mouth daily.    [provider]  vitamin C (ASCORBIC ACID) 500 MG tablet Take 500 mg by mouth daily.     [provider]    Allergies    Codeine, Dilaudid [hydromorphone hcl], Tape, Vicodin [hydrocodone-acetaminophen], and Latex  Review of Systems   Review of Systems  Constitutional: Negative for appetite change and fatigue.  HENT: Negative for congestion, ear discharge and sinus pressure.   Eyes: Negative for discharge.  Respiratory: Negative for cough.   Cardiovascular: Positive for chest pain.  Gastrointestinal: Negative for abdominal pain and diarrhea.  Genitourinary: Negative for frequency and hematuria.  Musculoskeletal: Negative for back pain.  Skin: Negative for rash.  Neurological: Negative for seizures and headaches.  Psychiatric/Behavioral: Negative for hallucinations.    Physical Exam Updated Vital Signs BP (!) 156/75    Pulse 67    Temp 97.7 F (36.5 C) (Oral)    Resp 17    Ht 5\' 4"  (1.626 m)    Wt 113.4 kg    SpO2 94%    BMI 42.91 kg/m   Physical Exam Vitals and nursing note reviewed.  Constitutional:      Appearance: She is well-developed.  HENT:     Head: Normocephalic.     Right Ear: Tympanic membrane normal.     Nose: Nose normal.     Mouth/Throat:     Mouth: Mucous membranes are moist.  Eyes:     General: No scleral icterus.    Extraocular Movements: EOM normal.     Conjunctiva/sclera: Conjunctivae normal.  Neck:     Thyroid: No thyromegaly.  Cardiovascular:     Rate and Rhythm: Normal rate and regular rhythm.     Heart sounds: No murmur heard. No friction rub. No gallop.   Pulmonary:     Breath sounds: No stridor. No wheezing or rales.  Chest:     Chest wall: No tenderness.  Abdominal:      General: There is no distension.     Tenderness: There is no abdominal tenderness. There is no rebound.  Musculoskeletal:        General: No edema. Normal range of motion.     Cervical back: Neck supple.  Lymphadenopathy:     Cervical: No cervical adenopathy.  Skin:    Findings: No erythema or rash.  Neurological:     Mental Status: She is alert and oriented to person, place, and time.     Motor: No abnormal muscle tone.     Coordination: Coordination normal.  Psychiatric:        Mood and Affect: Mood and affect normal.  Behavior: Behavior normal.     ED Results / Procedures / Treatments   Labs (all labs ordered are listed, but only abnormal results are displayed) Labs Reviewed  BASIC METABOLIC PANEL - Abnormal; Notable for the following components:      Result Value   Potassium 3.4 (*)    Glucose, Bld 223 (*)    All other components within normal limits  CBC - Abnormal; Notable for the following components:   WBC 2.7 (*)    Platelets 64 (*)    All other components within normal limits  D-DIMER, QUANTITATIVE (NOT AT [facility]) - Abnormal; Notable for the following components:   D-Dimer, Quant 1.16 (*)    All other components within normal limits  CBG MONITORING, ED - Abnormal; Notable for the following components:   Glucose-Capillary 223 (*)    All other components within normal limits  SARS CORONAVIRUS 2 (TAT 6-24 HRS)  TROPONIN I (HIGH SENSITIVITY)  TROPONIN I (HIGH SENSITIVITY)    EKG EKG Interpretation  Date/Time:  Saturday July 11 2020 06:11:04 EST Ventricular Rate:  91 PR Interval:    QRS Duration: 102 QT Interval:  362 QTC Calculation: 446 R Axis:   67 Text Interpretation: Sinus rhythm Atrial premature complex Low voltage, precordial leads Consider right ventricular hypertrophy Probable anteroseptal infarct, old No significant change since last tracing Confirmed by Zadie Rhine ([id number]) on 07/11/2020 6:14:08 AM   Radiology CT Angio Chest PE W  and/or Wo Contrast  Result Date: 07/11/2020 CLINICAL DATA:  Left chest pain.  Assess for pulmonary embolus. EXAM: CT ANGIOGRAPHY CHEST WITH CONTRAST TECHNIQUE: Multidetector CT imaging of the chest was performed using the standard protocol during bolus administration of intravenous contrast. Multiplanar CT image reconstructions and MIPs were obtained to evaluate the vascular anatomy. CONTRAST:  65mL OMNIPAQUE IOHEXOL 350 MG/ML SOLN COMPARISON:  February 07, 2020 FINDINGS: Cardiovascular: Satisfactory opacification of the pulmonary arteries to the segmental level. No evidence of pulmonary embolism. Normal heart size. No pericardial effusion. Mediastinum/Nodes: No enlarged mediastinal, hilar, or axillary lymph nodes. Thyroid gland, trachea, and esophagus demonstrate no significant findings. Lungs/Pleura: Lungs are clear. Minimal atelectasis of bilateral lung bases are noted. No pleural effusion or pneumothorax. Upper Abdomen: Status post prior cholecystectomy. Left adrenal mass is unchanged. Spleen is enlarged unchanged. Musculoskeletal: Degenerative joint changes of the spine are noted. Review of the MIP images confirms the above findings. IMPRESSION: 1. No pulmonary embolus. 2. Minimal atelectasis of bilateral lung bases. Electronically Signed   By: Sherian Rein M.D.   On: 07/11/2020 13:48   DG Chest Portable 1 View  Result Date: 07/11/2020 CLINICAL DATA:  Chest pain EXAM: PORTABLE CHEST 1 VIEW COMPARISON:  02/07/2020 chest radiograph. FINDINGS: Stable cardiomediastinal silhouette with normal heart size. No pneumothorax. No pleural effusion. Lungs appear clear, with no acute consolidative airspace disease and no pulmonary edema. IMPRESSION: No active disease. Electronically Signed   By: Delbert Phenix M.D.   On: 07/11/2020 06:48    Procedures Procedures (including critical care time)  Medications Ordered in ED Medications  morphine 2 MG/ML injection 2 mg (2 mg Intravenous Given 07/11/20 0751)  ondansetron  (ZOFRAN) injection 4 mg (4 mg Intravenous Given 07/11/20 0751)  iohexol (OMNIPAQUE) 350 MG/ML injection 75 mL (75 mLs Intravenous Contrast Given 07/11/20 1343)    ED Course  I have reviewed the triage vital signs and the nursing notes.  Pertinent labs & imaging results that were available during my care of the patient were reviewed by me and considered in  my medical decision making (see chart for details).    MDM Rules/Calculators/A&P                          Patient with atypical chest pain.  Most likely chest wall pain.  Troponin is normal CT angios negative.  She will be given some pain medicine will follow up Final Clinical Impression(s) / ED Diagnoses Final diagnoses:  Atypical chest pain    Rx / DC Orders ED Discharge Orders         Ordered    HYDROcodone-acetaminophen (NORCO/VICODIN) 5-325 MG tablet  Every 6 hours PRN        07/11/20 1437           Bethann Berkshire, MD 07/16/20 1135

## 2020-07-11 NOTE — ED Triage Notes (Signed)
Pt c/o left chest pain that radiates around to her back for the past 2 hours. Pt states it is similar to her chest pain in the past.

## 2020-07-11 NOTE — ED Notes (Signed)
Phone call from pt's daughter, Aggie Cosier, for update on pt; verbal permission obtained from pt to update daughter, update on pt status given

## 2020-10-10 ENCOUNTER — Ambulatory Visit
Admission: EM | Admit: 2020-10-10 | Discharge: 2020-10-10 | Disposition: A | Discharge status: Home / Self Care | Payer: Self-pay

## 2020-10-10 ENCOUNTER — Ambulatory Visit (INDEPENDENT_AMBULATORY_CARE_PROVIDER_SITE_OTHER): Payer: Self-pay

## 2020-10-10 ENCOUNTER — Other Ambulatory Visit: Payer: Self-pay

## 2020-10-10 ENCOUNTER — Encounter: Payer: Self-pay | Admitting: Emergency Medicine

## 2020-10-10 DIAGNOSIS — R059 Cough, unspecified: Secondary | ICD-10-CM

## 2020-10-10 DIAGNOSIS — R0602 Shortness of breath: Secondary | ICD-10-CM

## 2020-10-10 DIAGNOSIS — J209 Acute bronchitis, unspecified: Secondary | ICD-10-CM

## 2020-10-10 DIAGNOSIS — R062 Wheezing: Secondary | ICD-10-CM

## 2020-10-10 MED ORDER — PREDNISONE 10 MG (21) PO TBPK: ORAL_TABLET | Freq: Every day | ORAL | 0 refills | Status: DC

## 2020-10-10 MED ORDER — BENZONATATE 100 MG PO CAPS: 100.0000 mg | ORAL_CAPSULE | Freq: Three times a day (TID) | ORAL | 0 refills | Status: DC

## 2020-10-10 MED ORDER — ALBUTEROL SULFATE HFA 108 (90 BASE) MCG/ACT IN AERS: 1.0000 | INHALATION_SPRAY | Freq: Four times a day (QID) | RESPIRATORY_TRACT | 0 refills | Status: DC | PRN

## 2020-10-10 NOTE — ED Triage Notes (Signed)
SOB on exertion wheezing since last night, deep cough.  States she feel tired

## 2020-10-10 NOTE — Discharge Instructions (Signed)
X-rays concerning for possible mild bronchitis.  WE will cover you for that today Get plenty of rest and push fluids Prednisone prescribed.  Take as directed and to completion Tessalon for cough Albuterol inhaler for wheezing and shortness of breath Follow up with PCP next week for recheck Return or go to ER if you have any new or worsening symptoms such as fever, chills, fatigue, shortness of breath, wheezing, chest pain, nausea, changes in bowel or bladder habits, etc..Candice Stanley

## 2020-10-10 NOTE — ED Provider Notes (Signed)
[facility] CARE CENTER   [id number] 10/10/20 Arrival Time: 1318  Cc: COUGH  SUBJECTIVE:  Candice Stanley is a 55 y.o. female who presents with fatigue, lightheadedness, cough, wheezing and SOB x 1 week.  Admits to positive sick exposure or precipitating event.  Describes cough as intermittent and dry.  Worse with deep breath..  Has tried OTC medications without relief.  Symptoms are made worse with at night.  Denies previous symptoms in the past.   Denies fever, chills, chest pain, nausea, changes in bowel or bladder habits.    ROS: As per HPI.  All other pertinent ROS negative.     Past Medical History:  Diagnosis Date  . Anxiety   . Depression   . GERD (gastroesophageal reflux disease)   . Hypercholesteremia   . Hypertension   . Hypothyroidism   . Trimalleolar fracture of ankle, closed, right, sequela   . Type 2 diabetes mellitus (HCC)    Past Surgical History:  Procedure Laterality Date  . ABDOMINAL HYSTERECTOMY    . CARDIAC CATHETERIZATION N/A 09/10/2015   Procedure: Left Heart Cath and Coronary Angiography;  Surgeon: Corky Crafts, MD;  Location: [facility] INVASIVE CV LAB;  Service: Cardiovascular;  Laterality: N/A;  . CHOLECYSTECTOMY    . OPEN REDUCTION INTERNAL FIXATION (ORIF) TIBIA/FIBULA FRACTURE Right 08/04/2016   Procedure: OPEN REDUCTION INTERNAL FIXATION (ORIF) RIGHT TRIMALLEOLAR FRACTURE;  Surgeon: Toni Arthurs, MD;  Location: [facility];  Service: Orthopedics;  Laterality: Right;  . WRIST SURGERY     Allergies  Allergen Reactions  . Codeine Nausea And Vomiting  . Dilaudid [Hydromorphone Hcl]     Drop in blood pressure  . Tape Swelling and Other (See Comments)    Rash(paper tape)  . Vicodin [Hydrocodone-Acetaminophen] Nausea And Vomiting  . Latex Rash   No current facility-administered medications on file prior to encounter.   Current Outpatient Medications on File Prior to Encounter  Medication Sig Dispense Refill  . traZODone (DESYREL) 100 MG  tablet Take 100 mg by mouth at bedtime.    . ALPRAZolam (XANAX) 0.5 MG tablet Take 0.25-0.5 mg by mouth daily as needed for anxiety.     . Aspirin-Acetaminophen-Caffeine (GOODY HEADACHE PO) Take 1 packet by mouth daily as needed (for pain/headache).    Marland Kitchen dexlansoprazole (DEXILANT) 60 MG capsule Take 1 capsule (60 mg total) by mouth daily. 31 capsule 2  . famotidine (PEPCID) 20 MG tablet Take 20 mg by mouth at bedtime.    Marland Kitchen FLUoxetine (PROZAC) 40 MG capsule Take 40 mg by mouth daily.    Marland Kitchen HYDROcodone-acetaminophen (NORCO/VICODIN) 5-325 MG tablet Take 1 tablet by mouth every 6 (six) hours as needed. 20 tablet 0  . loratadine (CLARITIN) 10 MG tablet Take 10 mg by mouth daily.    . Multiple Vitamins-Minerals (ZINC PO) Take 1 tablet by mouth daily.    . vitamin C (ASCORBIC ACID) 500 MG tablet Take 500 mg by mouth daily.       Social History   Socioeconomic History  . Marital status: Married    Spouse name: Not on file  . Number of children: 2  . Years of education: Not on file  . Highest education level: Not on file  Occupational History  . Occupation: Orthoptist: [employer]  Tobacco Use  . Smoking status: Former Smoker    Types: Cigarettes    Quit date: 05/20/1991    Years since quitting: 29.4  . Smokeless tobacco: Never Used  .  Tobacco comment: Used to smoke a pack in 1-2 weeks/ 1992  Vaping Use  . Vaping Use: Never used  Substance and Sexual Activity  . Alcohol use: Not Currently    Alcohol/week: 0.0 standard drinks    Comment: social  . Drug use: No  . Sexual activity: Yes    Birth control/protection: Surgical  Other Topics Concern  . Not on file  Social History Narrative  . Not on file   Social Determinants of Health   Financial Resource Strain: Not on file  Food Insecurity: Not on file  Transportation Needs: Not on file  Physical Activity: Not on file  Stress: Not on file  Social Connections: Not on file  Intimate Partner Violence: Not on file    Family History  Problem Relation Age of Onset  . Coronary artery disease Father   . Heart disease Father   . Barrett's esophagus Father   . Hypertension Mother   . Coronary artery disease Paternal Uncle   . Coronary artery disease Cousin      OBJECTIVE:  Vitals:   10/10/20 1355  BP: (!) 160/94  Pulse: 89  Resp: 18  Temp: 98.8 F (37.1 C)  TempSrc: Oral  SpO2: 95%     General appearance: Alert, appears fatigued, but nontoxic; speaking in full sentences without difficulty HEENT:NCAT; Ears: EACs clear, TMs pearly gray; Eyes: PERRL.  EOM grossly intact. Nose: nares patent without rhinorrhea; Throat: tonsils nonerythematous or enlarged, uvula midline  Neck: supple without LAD Lungs: clear to auscultation bilaterally without adventitious breath sounds; normal respiratory effort; mild cough present Heart: regular rate and rhythm.  Skin: warm and dry Psychological: alert and cooperative; normal mood and affect  DIAGNOSTIC STUDIES:  DG Chest 2 View  Result Date: 10/10/2020 CLINICAL DATA:  Shortness of breath with wheezing and cough today. No known injury. EXAM: CHEST - 2 VIEW COMPARISON:  Radiographs 07/11/2020.  CT 07/11/2020. FINDINGS: The heart size and mediastinal contours are stable. The pulmonary vascularity is normal, although there is mildly increased interstitial, central airway and fissural thickening. There is no confluent airspace opacity, pleural effusion or pneumothorax. The bones appear unremarkable. Cholecystectomy clips are noted. IMPRESSION: Interval mildly increased pulmonary interstitial markings which may reflect interstitial edema or mild bronchitis. No focal airspace disease. Electronically Signed   By: Carey Bullocks M.D.   On: 10/10/2020 14:14    I have reviewed the x-rays myself and the radiologist interpretation. I am in agreement with the radiologist interpretation.     ASSESSMENT & PLAN:  1. Cough   2. Wheezing   3. Acute bronchitis, unspecified  organism     Meds ordered this encounter  Medications  . predniSONE (STERAPRED UNI-PAK 21 TAB) 10 MG (21) TBPK tablet    Sig: Take by mouth daily. Take 6 tabs by mouth daily  for 2 days, then 5 tabs for 2 days, then 4 tabs for 2 days, then 3 tabs for 2 days, 2 tabs for 2 days, then 1 tab by mouth daily for 2 days    Dispense:  42 tablet    Refill:  0    Order Specific Question:   Supervising Provider    Answer:   Eustace Moore [[id number]]  . benzonatate (TESSALON) 100 MG capsule    Sig: Take 1 capsule (100 mg total) by mouth every 8 (eight) hours.    Dispense:  21 capsule    Refill:  0    Order Specific Question:   Supervising Provider  AnswerEustace Moore [[id number]]  . albuterol (VENTOLIN HFA) 108 (90 Base) MCG/ACT inhaler    Sig: Inhale 1-2 puffs into the lungs every 6 (six) hours as needed for wheezing or shortness of breath.    Dispense:  18 g    Refill:  0    Order Specific Question:   Supervising Provider    Answer:   Eustace Moore [[id number]]    Orders Placed This Encounter  Procedures  . DG Chest 2 View    Standing Status:   Standing    Number of Occurrences:   1    Order Specific Question:   Reason for Exam (SYMPTOM  OR DIAGNOSIS REQUIRED)    Answer:   SOB, wheezing    X-rays concerning for possible mild bronchitis.  WE will cover you for that today Get plenty of rest and push fluids Prednisone prescribed.  Take as directed and to completion Tessalon for cough Albuterol inhaler for wheezing and shortness of breath Follow up with PCP next week for recheck Return or go to ER if you have any new or worsening symptoms such as fever, chills, fatigue, shortness of breath, wheezing, chest pain, nausea, changes in bowel or bladder habits, etc...  Reviewed expectations re: course of current medical issues. Questions answered. Outlined signs and symptoms indicating need for more acute intervention. Patient verbalized understanding. After Visit Summary  given.          Rennis Harding, PA-C 10/10/20 1429

## 2021-03-02 ENCOUNTER — Encounter (HOSPITAL_COMMUNITY): Payer: Self-pay | Admitting: Internal Medicine

## 2021-03-19 ENCOUNTER — Other Ambulatory Visit: Payer: Self-pay

## 2021-03-19 ENCOUNTER — Ambulatory Visit (HOSPITAL_COMMUNITY): Payer: Self-pay | Attending: Internal Medicine

## 2021-03-19 DIAGNOSIS — R072 Precordial pain: Secondary | ICD-10-CM | POA: Insufficient documentation

## 2021-03-19 LAB — ECHOCARDIOGRAM COMPLETE
AR max vel: 1.74 cm2
AV Area VTI: 1.65 cm2
AV Area mean vel: 1.75 cm2
AV Mean grad: 10 mmHg
AV Peak grad: 20.1 mmHg
Ao pk vel: 2.24 m/s
Area-P 1/2: 3.97 cm2
S' Lateral: 3.9 cm

## 2021-03-29 ENCOUNTER — Telehealth: Payer: Self-pay | Admitting: Internal Medicine

## 2021-03-29 NOTE — Telephone Encounter (Signed)
Called patient, gave response from MD.  Patient verbalized understanding.   Will route to primary RN as Lorain Childes that patient was given results.

## 2021-03-29 NOTE — Telephone Encounter (Signed)
Patient returning call.

## 2021-03-29 NOTE — Telephone Encounter (Signed)
Returned call to patient, patient requesting echo results.  Advised once reviewed by MD will call with results.

## 2021-03-29 NOTE — Telephone Encounter (Signed)
Pt is reaching out wanting her Echo results verbally explained to her... please advise

## 2021-03-29 NOTE — Telephone Encounter (Signed)
Attempted to call patient, left message for patient to call back to office.   

## 2021-04-09 ENCOUNTER — Inpatient Hospital Stay (HOSPITAL_COMMUNITY): Payer: Self-pay

## 2021-04-09 ENCOUNTER — Other Ambulatory Visit: Payer: Self-pay

## 2021-04-09 ENCOUNTER — Inpatient Hospital Stay (HOSPITAL_COMMUNITY): Payer: Self-pay | Attending: Hematology | Admitting: Hematology

## 2021-04-09 DIAGNOSIS — D72819 Decreased white blood cell count, unspecified: Secondary | ICD-10-CM | POA: Insufficient documentation

## 2021-04-09 DIAGNOSIS — Z87891 Personal history of nicotine dependence: Secondary | ICD-10-CM | POA: Insufficient documentation

## 2021-04-09 DIAGNOSIS — E119 Type 2 diabetes mellitus without complications: Secondary | ICD-10-CM | POA: Insufficient documentation

## 2021-04-09 DIAGNOSIS — Z90710 Acquired absence of both cervix and uterus: Secondary | ICD-10-CM | POA: Insufficient documentation

## 2021-04-09 DIAGNOSIS — Z803 Family history of malignant neoplasm of breast: Secondary | ICD-10-CM | POA: Insufficient documentation

## 2021-04-09 DIAGNOSIS — D696 Thrombocytopenia, unspecified: Secondary | ICD-10-CM | POA: Insufficient documentation

## 2021-04-09 DIAGNOSIS — Z1231 Encounter for screening mammogram for malignant neoplasm of breast: Secondary | ICD-10-CM

## 2021-04-09 DIAGNOSIS — I10 Essential (primary) hypertension: Secondary | ICD-10-CM | POA: Insufficient documentation

## 2021-04-09 DIAGNOSIS — D61818 Other pancytopenia: Secondary | ICD-10-CM

## 2021-04-09 LAB — CBC WITH DIFFERENTIAL/PLATELET
Abs Immature Granulocytes: 0 10*3/uL (ref 0.00–0.07)
Basophils Absolute: 0 10*3/uL (ref 0.0–0.1)
Basophils Relative: 1 %
Eosinophils Absolute: 0 10*3/uL (ref 0.0–0.5)
Eosinophils Relative: 1 %
HCT: 36.4 % (ref 36.0–46.0)
Hemoglobin: 12.1 g/dL (ref 12.0–15.0)
Immature Granulocytes: 0 %
Lymphocytes Relative: 36 %
Lymphs Abs: 0.8 10*3/uL (ref 0.7–4.0)
MCH: 32.4 pg (ref 26.0–34.0)
MCHC: 33.2 g/dL (ref 30.0–36.0)
MCV: 97.3 fL (ref 80.0–100.0)
Monocytes Absolute: 0.1 10*3/uL (ref 0.1–1.0)
Monocytes Relative: 6 %
Neutro Abs: 1.3 10*3/uL — ABNORMAL LOW (ref 1.7–7.7)
Neutrophils Relative %: 56 %
Platelets: 55 10*3/uL — ABNORMAL LOW (ref 150–400)
RBC: 3.74 MIL/uL — ABNORMAL LOW (ref 3.87–5.11)
RDW: 14.1 % (ref 11.5–15.5)
WBC: 2.3 10*3/uL — ABNORMAL LOW (ref 4.0–10.5)
nRBC: 0 % (ref 0.0–0.2)

## 2021-04-09 LAB — HEPATITIS C ANTIBODY: HCV Ab: NONREACTIVE

## 2021-04-09 LAB — FOLATE: Folate: 21.5 ng/mL (ref >5.9)

## 2021-04-09 LAB — LACTATE DEHYDROGENASE: LDH: 163 U/L (ref 98–192)

## 2021-04-09 LAB — VITAMIN B12: Vitamin B-12: 778 pg/mL (ref 180–914)

## 2021-04-09 LAB — HEPATITIS B SURFACE ANTIBODY,QUALITATIVE: Hep B S Ab: NONREACTIVE

## 2021-04-09 NOTE — Progress Notes (Signed)
[facility] CONSULT NOTE  Patient Care Team: Celene Squibb, MD as PCP - General (Internal Medicine) Danie Binder, MD (Inactive) as Consulting Physician (Gastroenterology)  CHIEF COMPLAINTS/PURPOSE OF CONSULTATION:  Severe leukopenia and thrombocytopenia  HISTORY OF PRESENTING ILLNESS:  Candice Stanley 55 y.o. female is seen in consultation today for further work-up of wound management of leukopenia and thrombocytopenia.  Recent labs at Dr. Juel Burrow office from 03/31/2021 with white count 2.1, ANC 0.9, platelet count 48.  Occasional metamyelocytes seen.  Hemoglobin was 11.7 with normal MCV.  She denies any bleeding but has easy bruising.  Denies any fevers, night sweats or weight loss.  No recurrent infections.  No new onset pains.  No history of transfusions.  No history of hepatitis in the past.  New medication includes Cozaar which was recently started for blood pressure.  She lives at home with her husband.  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  Non-smoker.  Mother has Karlene Lineman cirrhosis.  Maternal grandmother had breast cancer and paternal aunt had breast cancer.  MEDICAL HISTORY:  Past Medical History:  Diagnosis Date   Anxiety    Depression    GERD (gastroesophageal reflux disease)    Hypercholesteremia    Hypertension    Hypothyroidism    Trimalleolar fracture of ankle, closed, right, sequela    Type 2 diabetes mellitus ([place])     SURGICAL HISTORY: Past Surgical History:  Procedure Laterality Date   ABDOMINAL HYSTERECTOMY     CARDIAC CATHETERIZATION N/A 09/10/2015   Procedure: Left Heart Cath and Coronary Angiography;  Surgeon: Jettie Booze, MD;  Location: [facility];  Service: Cardiovascular;  Laterality: N/A;   CHOLECYSTECTOMY     OPEN REDUCTION INTERNAL FIXATION (ORIF) TIBIA/FIBULA FRACTURE Right 08/04/2016   Procedure: OPEN REDUCTION INTERNAL FIXATION (ORIF) RIGHT TRIMALLEOLAR FRACTURE;  Surgeon: Wylene Simmer, MD;   Location: [facility];  Service: Orthopedics;  Laterality: Right;   WRIST SURGERY      SOCIAL HISTORY: Social History   Socioeconomic History   Marital status: Married    Spouse name: Not on file   Number of children: 2   Years of education: Not on file   Highest education level: Not on file  Occupational History   Occupation: property mgmt    Employer: [employer]  Tobacco Use   Smoking status: Former    Types: Cigarettes    Quit date: 05/20/1991    Years since quitting: 29.9   Smokeless tobacco: Never   Tobacco comments:    Used to smoke a pack in 1-2 weeks/ 1992  Vaping Use   Vaping Use: Never used  Substance and Sexual Activity   Alcohol use: Not Currently    Alcohol/week: 0.0 standard drinks    Comment: social   Drug use: No   Sexual activity: Yes    Birth control/protection: Surgical  Other Topics Concern   Not on file  Social History Narrative   Not on file   Social Determinants of Health   Financial Resource Strain: Not on file  Food Insecurity: Not on file  Transportation Needs: Not on file  Physical Activity: Not on file  Stress: Not on file  Social Connections: Not on file  Intimate Partner Violence: Not on file    FAMILY HISTORY: Family History  Problem Relation Age of Onset   Coronary artery disease Father    Heart disease Father    Barrett's esophagus Father  Hypertension Mother    Coronary artery disease Paternal Uncle    Coronary artery disease Cousin     ALLERGIES:  is allergic to codeine, dilaudid [hydromorphone hcl], tape, vicodin [hydrocodone-acetaminophen], and latex.  MEDICATIONS:  Current Outpatient Medications  Medication Sig Dispense Refill   metFORMIN (GLUCOPHAGE) 500 MG tablet Take by mouth 2 (two) times daily with a meal.     ALPRAZolam (XANAX) 0.5 MG tablet Take 0.25-0.5 mg by mouth daily as needed for anxiety.  (Patient not taking: Reported on 04/09/2021)     Aspirin-Acetaminophen-Caffeine (GOODY  HEADACHE PO) Take 1 packet by mouth daily as needed (for pain/headache). (Patient not taking: Reported on 04/09/2021)     benzonatate (TESSALON) 100 MG capsule Take 1 capsule (100 mg total) by mouth every 8 (eight) hours. 21 capsule 0   dexlansoprazole (DEXILANT) 60 MG capsule Take 1 capsule (60 mg total) by mouth daily. 31 capsule 2   famotidine (PEPCID) 20 MG tablet Take 20 mg by mouth at bedtime.     FLUoxetine (PROZAC) 40 MG capsule Take 40 mg by mouth daily.     HYDROcodone-acetaminophen (NORCO/VICODIN) 5-325 MG tablet Take 1 tablet by mouth every 6 (six) hours as needed. (Patient not taking: Reported on 04/09/2021) 20 tablet 0   loratadine (CLARITIN) 10 MG tablet Take 10 mg by mouth daily.     losartan (COZAAR) 25 MG tablet Take 25 mg by mouth daily.     Multiple Vitamins-Minerals (ZINC PO) Take 1 tablet by mouth daily.     predniSONE (STERAPRED UNI-PAK 21 TAB) 10 MG (21) TBPK tablet Take by mouth daily. Take 6 tabs by mouth daily  for 2 days, then 5 tabs for 2 days, then 4 tabs for 2 days, then 3 tabs for 2 days, 2 tabs for 2 days, then 1 tab by mouth daily for 2 days 42 tablet 0   traZODone (DESYREL) 100 MG tablet Take 100 mg by mouth at bedtime.     vitamin C (ASCORBIC ACID) 500 MG tablet Take 500 mg by mouth daily.      No current facility-administered medications for this visit.    REVIEW OF SYSTEMS:   Constitutional: Denies fevers, chills or abnormal night sweats Eyes: Denies blurriness of vision, double vision or watery eyes Ears, nose, mouth, throat, and face: Denies mucositis or sore throat Respiratory: Positive for shortness of breath on exertion. Cardiovascular: Denies palpitation, chest discomfort or lower extremity swelling Gastrointestinal: Positive for constipation and nausea occasionally. Skin: Denies abnormal skin rashes Lymphatics: Denies new lymphadenopathy or easy bruising Neurological:Denies numbness, tingling or new weaknesses Behavioral/Psych: Mood is stable, no  new changes  All other systems were reviewed with the patient and are negative.  PHYSICAL EXAMINATION: ECOG PERFORMANCE STATUS: 0 - Asymptomatic  There were no vitals filed for this visit. There were no vitals filed for this visit.  GENERAL:alert, no distress and comfortable SKIN: skin color, texture, turgor are normal, no rashes or significant lesions EYES: normal, conjunctiva are pink and non-injected, sclera clear OROPHARYNX:no exudate, no erythema and lips, buccal mucosa, and tongue normal  NECK: supple, thyroid normal size, non-tender, without nodularity LYMPH:  no palpable lymphadenopathy in the cervical, axillary or inguinal LUNGS: clear to auscultation and percussion with normal breathing effort HEART: regular rate & rhythm and no murmurs and no lower extremity edema ABDOMEN:abdomen soft, non-tender and normal bowel sounds Musculoskeletal:no cyanosis of digits and no clubbing  PSYCH: alert & oriented x 3 with fluent speech NEURO: no focal motor/sensory deficits  LABORATORY  DATA:  I have reviewed the data as listed Lab Results  Component Value Date   WBC 2.3 (L) 04/09/2021   HGB 12.1 04/09/2021   HCT 36.4 04/09/2021   MCV 97.3 04/09/2021   PLT 55 (L) 04/09/2021     Chemistry      Component Value Date/Time   NA 136 07/11/2020 0622   K 3.4 (L) 07/11/2020 0622   CL 108 07/11/2020 0622   CO2 23 07/11/2020 0622   BUN 8 07/11/2020 0622   CREATININE 0.57 07/11/2020 0622      Component Value Date/Time   CALCIUM 9.2 07/11/2020 0622   ALKPHOS 98 10/16/2018 2035   AST 43 (H) 10/16/2018 2035   ALT 28 10/16/2018 2035   BILITOT 1.4 (H) 10/16/2018 2035       RADIOGRAPHIC STUDIES: I have personally reviewed the radiological images as listed and agreed with the findings in the report. ECHOCARDIOGRAM COMPLETE  Result Date: 03/19/2021    ECHOCARDIOGRAM REPORT   Patient Name:   Candice Stanley Date of Exam: 03/19/2021 Medical Rec #:  [id number]     Height:       64.0 in Accession  #:    [id number]    Weight:       250.0 lb Date of Birth:  07-Apr-1966     BSA:          2.151 m Patient Age:    55 years      BP:           164/96 mmHg Patient Gender: F             HR:           67 bpm. Exam Location:  [address] Procedure: 2D Echo, Cardiac Doppler and Color Doppler Indications:    R07.2 Precordial pain  History:        Patient has prior history of Echocardiogram examinations, most                 recent 03/10/2020. Signs/Symptoms:Chest Pain; Risk                 Factors:Hypertension, Diabetes, Dyslipidemia and Former Smoker.  Sonographer:    Coralyn Helling RDCS Referring Phys: [id number] Elouise Munroe  Sonographer Comments: Patient is morbidly obese and Technically difficult study due to poor echo windows. Image acquisition challenging due to patient body habitus. IMPRESSIONS  1. Left ventricular ejection fraction, by estimation, is 60 to 65%. The left ventricle has normal function. The left ventricle has no regional wall motion abnormalities. Left ventricular diastolic parameters were normal.  2. Right ventricular systolic function is normal. The right ventricular size is normal. There is normal pulmonary artery systolic pressure. The estimated right ventricular systolic pressure is 65.0 mmHg.  3. Left atrial size was moderately dilated.  4. The mitral valve is grossly normal. Mild mitral valve regurgitation.  5. The aortic valve was not well visualized. Aortic valve regurgitation is not visualized. Mild to moderate aortic valve sclerosis/calcification is present, without any evidence of aortic stenosis. Aortic valve mean gradient measures 10.0 mmHg. Borderline stenosis by calculation - DI is 0.53 - valve could not be well visualized directly.  6. The inferior vena cava is normal in size with greater than 50% respiratory variability, suggesting right atrial pressure of 3 mmHg. Comparison(s): Changes from prior study are noted. LVEF 60-65%, mild MR, aortic valve sclerosis - mean gradient 7  mmHg. FINDINGS  Left Ventricle: Left ventricular ejection fraction, by estimation, is  60 to 65%. The left ventricle has normal function. The left ventricle has no regional wall motion abnormalities. The left ventricular internal cavity size was normal in size. There is  no left ventricular hypertrophy. Left ventricular diastolic parameters were normal. Right Ventricle: The right ventricular size is normal. No increase in right ventricular wall thickness. Right ventricular systolic function is normal. There is normal pulmonary artery systolic pressure. The tricuspid regurgitant velocity is 2.45 m/s, and  with an assumed right atrial pressure of 3 mmHg, the estimated right ventricular systolic pressure is 30.1 mmHg. Left Atrium: Left atrial size was moderately dilated. Right Atrium: Right atrial size was normal in size. Pericardium: There is no evidence of pericardial effusion. Mitral Valve: The mitral valve is grossly normal. Mild mitral valve regurgitation. Tricuspid Valve: The tricuspid valve is grossly normal. Tricuspid valve regurgitation is trivial. Aortic Valve: The aortic valve was not well visualized. Aortic valve regurgitation is not visualized. Mild to moderate aortic valve sclerosis/calcification is present, without any evidence of aortic stenosis. Aortic valve mean gradient measures 10.0 mmHg. Aortic valve peak gradient measures 20.1 mmHg. Aortic valve area, by VTI measures 1.65 cm. Pulmonic Valve: The pulmonic valve was normal in structure. Pulmonic valve regurgitation is not visualized. Aorta: The aortic root and ascending aorta are structurally normal, with no evidence of dilitation. Venous: The inferior vena cava is normal in size with greater than 50% respiratory variability, suggesting right atrial pressure of 3 mmHg. IAS/Shunts: No atrial level shunt detected by color flow Doppler.  LEFT VENTRICLE PLAX 2D LVIDd:         5.70 cm  Diastology LVIDs:         3.90 cm  LV e' medial:    9.03 cm/s LV PW:          1.00 cm  LV E/e' medial:  12.8 LV IVS:        1.00 cm  LV e' lateral:   14.10 cm/s LVOT diam:     2.00 cm  LV E/e' lateral: 8.2 LV SV:         79 LV SV Index:   37 LVOT Area:     3.14 cm  RIGHT VENTRICLE            IVC TAPSE (M-mode): 2.7 cm     IVC diam: 1.70 cm RVSP:           27.0 mmHg LEFT ATRIUM              Index       RIGHT ATRIUM           Index LA diam:        4.60 cm  2.14 cm/m  RA Pressure: 3.00 mmHg LA Vol (A2C):   87.0 ml  40.45 ml/m RA Area:     15.80 cm LA Vol (A4C):   109.0 ml 50.67 ml/m RA Volume:   39.90 ml  18.55 ml/m LA Biplane Vol: 98.4 ml  45.75 ml/m  AORTIC VALVE AV Area (Vmax):    1.74 cm AV Area (Vmean):   1.75 cm AV Area (VTI):     1.65 cm AV Vmax:           224.00 cm/s AV Vmean:          150.500 cm/s AV VTI:            0.478 m AV Peak Grad:      20.1 mmHg AV Mean Grad:      10.0 mmHg  LVOT Vmax:         124.00 cm/s LVOT Vmean:        83.800 cm/s LVOT VTI:          0.251 m LVOT/AV VTI ratio: 0.53  AORTA Ao Root diam: 3.10 cm Ao Asc diam:  3.00 cm MITRAL VALVE                TRICUSPID VALVE MV Area (PHT): 3.97 cm     TR Peak grad:   24.0 mmHg MV Decel Time: 191 msec     TR Vmax:        245.00 cm/s MV E velocity: 116.00 cm/s  Estimated RAP:  3.00 mmHg MV A velocity: 59.70 cm/s   RVSP:           27.0 mmHg MV E/A ratio:  1.94                             SHUNTS                             Systemic VTI:  0.25 m                             Systemic Diam: 2.00 cm Lyman Bishop MD Electronically signed by Lyman Bishop MD Signature Date/Time: 03/19/2021/1:44:32 PM    Final     ASSESSMENT:  1.  Leukopenia and thrombocytopenia: - Patient seen at the request of Dr. Juel Burrow office. - CBC on 03/31/2021 with white count 2.1, platelet count 48, ANC 900.  Hemoglobin was 11.7. - CBC on 12/30/2020 white count 2.5, platelet count 57, hemoglobin 12.5, ANC 1.2. - Ultrasound abdomen on 06/05/2018 with increased hepatic echogenicity, fatty infiltration and splenomegaly measuring 18.8 cm, volume  1943. - No bleeding although easy bruising.  No B symptoms or infections.  No history of transfusions or hepatitis.  2.  Social/family history: - Lives at home with her husband.  She works as a Secondary school teacher.  She is accompanied by her daughter today.  Denies any chemical exposure.  She is a non-smoker. - Her mother hadsNash cirrhosis.  Maternal grandmother had breast cancer.  Paternal aunt had breast cancer.  PLAN:  1.  Leukopenia and thrombocytopenia: - She has leukopenia since 2018 although gradually worsening. - She has thrombocytopenia since July 2018, with progressive worsening. - New medication is losartan for blood pressure which was started few months ago. - Differential diagnosis includes hypersplenism versus bone marrow infiltrative disorder. - We have repeated CBC today which showed white count 2.3 and platelet count 55 with ANC of 1.3.  MCV was normal. - We will check for nutritional deficiencies and hepatitis serology. - Recommend bone marrow aspiration and biopsy to rule out infiltrative process. - RTC 4 weeks for follow-up.  2.  Health maintenance: - She did not have recent mammogram in the last 10 years. - Would recommend mammogram.   Orders Placed This Encounter  Procedures   MM Digital Screening    Standing Status:   Future    Standing Expiration Date:   04/09/2022    Order Specific Question:   Reason for Exam (SYMPTOM  OR DIAGNOSIS REQUIRED)    Answer:   breast cancer screening    Order Specific Question:   Is the patient pregnant?    Answer:   No    Order  Specific Question:   Preferred imaging location?    Answer:   [facility]   CT BONE MARROW BIOPSY    Standing Status:   Future    Standing Expiration Date:   04/09/2022    Order Specific Question:   Reason for Exam (SYMPTOM  OR DIAGNOSIS REQUIRED)    Answer:   pancytopenia    Order Specific Question:   Is patient pregnant?    Answer:   No    Order Specific Question:   Preferred location?     Answer:   [facility]   CT Biopsy    Flow cytometry chromosomes    Standing Status:   Future    Standing Expiration Date:   04/09/2022    Order Specific Question:   Lab orders requested (DO NOT place separate lab orders, these will be automatically ordered during procedure specimen collection):    Answer:   Surgical Pathology    Order Specific Question:   Reason for Exam (SYMPTOM  OR DIAGNOSIS REQUIRED)    Answer:   pancytopenia    Order Specific Question:   Is patient pregnant?    Answer:   No    Order Specific Question:   Preferred location?    Answer:   [facility]    Order Specific Question:   Call Results- Best Contact Number?    Answer:   [phone]   CBC with Differential    Standing Status:   Future    Number of Occurrences:   1    Standing Expiration Date:   04/09/2022   Lactate dehydrogenase    Standing Status:   Future    Number of Occurrences:   1    Standing Expiration Date:   04/09/2022   Vitamin B12    Standing Status:   Future    Number of Occurrences:   1    Standing Expiration Date:   04/09/2022   Folate    Standing Status:   Future    Number of Occurrences:   1    Standing Expiration Date:   04/09/2022   Methylmalonic acid, serum    Standing Status:   Future    Number of Occurrences:   1    Standing Expiration Date:   04/09/2022   Protein electrophoresis, serum    Standing Status:   Future    Number of Occurrences:   1    Standing Expiration Date:   04/09/2022   Copper, serum    Standing Status:   Future    Number of Occurrences:   1    Standing Expiration Date:   04/09/2022   Hepatitis C Antibody   Hepatitis B surface antibody    Standing Status:   Future    Number of Occurrences:   1    Standing Expiration Date:   04/09/2022   Pathologist smear review    Standing Status:   Future    Number of Occurrences:   1    Standing Expiration Date:   04/09/2022    All questions were answered. The patient knows to call the clinic with any  problems, questions or concerns.      Derek Jack, MD 04/09/2021 2:30 PM

## 2021-04-11 LAB — COPPER, SERUM: Copper: 92 ug/dL (ref 80–158)

## 2021-04-12 ENCOUNTER — Ambulatory Visit (HOSPITAL_COMMUNITY): Payer: Self-pay | Admitting: Hematology

## 2021-04-12 LAB — PATHOLOGIST SMEAR REVIEW

## 2021-04-12 LAB — METHYLMALONIC ACID, SERUM: Methylmalonic Acid, Quantitative: 104 nmol/L (ref 0–378)

## 2021-04-13 LAB — PROTEIN ELECTROPHORESIS, SERUM
A/G Ratio: 1.1 (ref 0.7–1.7)
Albumin ELP: 3.1 g/dL (ref 2.9–4.4)
Alpha-1-Globulin: 0.2 g/dL (ref 0.0–0.4)
Alpha-2-Globulin: 0.4 g/dL (ref 0.4–1.0)
Beta Globulin: 1 g/dL (ref 0.7–1.3)
Gamma Globulin: 1.2 g/dL (ref 0.4–1.8)
Globulin, Total: 2.8 g/dL (ref 2.2–3.9)
Total Protein ELP: 5.9 g/dL — ABNORMAL LOW (ref 6.0–8.5)

## 2021-04-14 ENCOUNTER — Ambulatory Visit (HOSPITAL_COMMUNITY)
Admission: RE | Admit: 2021-04-14 | Discharge: 2021-04-14 | Disposition: A | Discharge status: Home / Self Care | Payer: Self-pay | Source: Ambulatory Visit | Attending: Hematology | Admitting: Hematology

## 2021-04-14 ENCOUNTER — Other Ambulatory Visit: Payer: Self-pay

## 2021-04-14 DIAGNOSIS — Z1231 Encounter for screening mammogram for malignant neoplasm of breast: Secondary | ICD-10-CM | POA: Insufficient documentation

## 2021-04-19 ENCOUNTER — Other Ambulatory Visit (HOSPITAL_COMMUNITY): Payer: Self-pay | Admitting: Hematology

## 2021-04-19 ENCOUNTER — Other Ambulatory Visit (HOSPITAL_COMMUNITY): Payer: Self-pay | Admitting: Physician Assistant

## 2021-04-19 DIAGNOSIS — R928 Other abnormal and inconclusive findings on diagnostic imaging of breast: Secondary | ICD-10-CM

## 2021-04-19 DIAGNOSIS — N6489 Other specified disorders of breast: Secondary | ICD-10-CM

## 2021-04-21 ENCOUNTER — Other Ambulatory Visit: Payer: Self-pay

## 2021-04-21 ENCOUNTER — Encounter (HOSPITAL_COMMUNITY): Payer: Self-pay

## 2021-04-21 ENCOUNTER — Ambulatory Visit (HOSPITAL_COMMUNITY)
Admission: RE | Admit: 2021-04-21 | Discharge: 2021-04-21 | Disposition: A | Discharge status: Home / Self Care | Payer: Self-pay | Source: Ambulatory Visit | Attending: Hematology | Admitting: Hematology

## 2021-04-21 DIAGNOSIS — Z885 Allergy status to narcotic agent status: Secondary | ICD-10-CM | POA: Insufficient documentation

## 2021-04-21 DIAGNOSIS — Z888 Allergy status to other drugs, medicaments and biological substances status: Secondary | ICD-10-CM | POA: Insufficient documentation

## 2021-04-21 DIAGNOSIS — Z79899 Other long term (current) drug therapy: Secondary | ICD-10-CM | POA: Insufficient documentation

## 2021-04-21 DIAGNOSIS — Z7984 Long term (current) use of oral hypoglycemic drugs: Secondary | ICD-10-CM | POA: Insufficient documentation

## 2021-04-21 DIAGNOSIS — D61818 Other pancytopenia: Secondary | ICD-10-CM | POA: Insufficient documentation

## 2021-04-21 DIAGNOSIS — I10 Essential (primary) hypertension: Secondary | ICD-10-CM | POA: Insufficient documentation

## 2021-04-21 DIAGNOSIS — E7800 Pure hypercholesterolemia, unspecified: Secondary | ICD-10-CM | POA: Insufficient documentation

## 2021-04-21 DIAGNOSIS — Z87891 Personal history of nicotine dependence: Secondary | ICD-10-CM | POA: Insufficient documentation

## 2021-04-21 DIAGNOSIS — E119 Type 2 diabetes mellitus without complications: Secondary | ICD-10-CM | POA: Insufficient documentation

## 2021-04-21 LAB — CBC WITH DIFFERENTIAL/PLATELET
Abs Immature Granulocytes: 0 10*3/uL (ref 0.00–0.07)
Basophils Absolute: 0 10*3/uL (ref 0.0–0.1)
Basophils Relative: 0 %
Eosinophils Absolute: 0 10*3/uL (ref 0.0–0.5)
Eosinophils Relative: 1 %
HCT: 37.4 % (ref 36.0–46.0)
Hemoglobin: 12.3 g/dL (ref 12.0–15.0)
Immature Granulocytes: 0 %
Lymphocytes Relative: 43 %
Lymphs Abs: 1.1 10*3/uL (ref 0.7–4.0)
MCH: 31.1 pg (ref 26.0–34.0)
MCHC: 32.9 g/dL (ref 30.0–36.0)
MCV: 94.4 fL (ref 80.0–100.0)
Monocytes Absolute: 0.2 10*3/uL (ref 0.1–1.0)
Monocytes Relative: 6 %
Neutro Abs: 1.2 10*3/uL — ABNORMAL LOW (ref 1.7–7.7)
Neutrophils Relative %: 50 %
Platelets: 57 10*3/uL — ABNORMAL LOW (ref 150–400)
RBC: 3.96 MIL/uL (ref 3.87–5.11)
RDW: 14.1 % (ref 11.5–15.5)
WBC: 2.5 10*3/uL — ABNORMAL LOW (ref 4.0–10.5)
nRBC: 0 % (ref 0.0–0.2)

## 2021-04-21 LAB — GLUCOSE, CAPILLARY: Glucose-Capillary: 121 mg/dL — ABNORMAL HIGH (ref 70–99)

## 2021-04-21 MED ORDER — FENTANYL CITRATE (PF) 100 MCG/2ML IJ SOLN
INTRAMUSCULAR | Status: DC | PRN
  Administered 2021-04-21 (×2): 50 ug via INTRAVENOUS

## 2021-04-21 MED ORDER — SODIUM CHLORIDE 0.9 % IV SOLN: INTRAVENOUS | Status: DC

## 2021-04-21 MED ORDER — MIDAZOLAM HCL 2 MG/2ML IJ SOLN
INTRAMUSCULAR | Status: AC
  Filled 2021-04-21: qty 4

## 2021-04-21 MED ORDER — MIDAZOLAM HCL 2 MG/2ML IJ SOLN
INTRAMUSCULAR | Status: DC | PRN
  Administered 2021-04-21 (×4): 1 mg via INTRAVENOUS

## 2021-04-21 MED ORDER — FENTANYL CITRATE (PF) 100 MCG/2ML IJ SOLN
INTRAMUSCULAR | Status: AC
  Filled 2021-04-21: qty 2

## 2021-04-21 NOTE — Procedures (Signed)
Interventional Radiology Procedure Note  Procedure: CT guided bone marrow aspiration and biopsy  Complications: None  EBL: < 10 mL  Findings: Aspirate and core biopsy performed of bone marrow in right iliac bone.  Plan: Bedrest supine x 1 hrs  Mercie Balsley T. [facility], M.D Pager:  [phone]   

## 2021-04-21 NOTE — Discharge Instructions (Signed)

## 2021-04-21 NOTE — Consult Note (Signed)
Chief Complaint: Patient was seen in consultation today for CT-guided bone marrow biopsy  Referring Physician(s): Odell  Supervising Physician: Aletta Edouard  Patient Status: [facility] - Out-pt  History of Present Illness: Candice Stanley is a 55 y.o. female with past medical history of anxiety/depression, GERD, hyperlipidemia, hypertension, hypothyroidism and diabetes.  History also significant for leukopenia/thrombocytopenia and splenomegaly of uncertain etiology.  She is scheduled today for CT-guided bone marrow biopsy for further evaluation.  Past Medical History:  Diagnosis Date   Anxiety    Depression    GERD (gastroesophageal reflux disease)    Hypercholesteremia    Hypertension    Hypothyroidism    Trimalleolar fracture of ankle, closed, right, sequela    Type 2 diabetes mellitus ([place])     Past Surgical History:  Procedure Laterality Date   ABDOMINAL HYSTERECTOMY     CARDIAC CATHETERIZATION N/A 09/10/2015   Procedure: Left Heart Cath and Coronary Angiography;  Surgeon: Jettie Booze, MD;  Location: [place] [facility];  Service: Cardiovascular;  Laterality: N/A;   CHOLECYSTECTOMY     OPEN REDUCTION INTERNAL FIXATION (ORIF) TIBIA/FIBULA FRACTURE Right 08/04/2016   Procedure: OPEN REDUCTION INTERNAL FIXATION (ORIF) RIGHT TRIMALLEOLAR FRACTURE;  Surgeon: Wylene Simmer, MD;  Location: [place];  Service: Orthopedics;  Laterality: Right;   WRIST SURGERY      Allergies: Codeine, Dilaudid [hydromorphone hcl], Tape, Vicodin [hydrocodone-acetaminophen], and Latex  Medications: Prior to Admission medications   Medication Sig Start Date End Date Taking? Authorizing Provider  dexlansoprazole (DEXILANT) 60 MG capsule Take 1 capsule (60 mg total) by mouth daily. 04/16/12  Yes Andria Meuse, NP  famotidine (PEPCID) 20 MG tablet Take 20 mg by mouth at bedtime.   Yes [provider]  FLUoxetine (PROZAC) 40 MG capsule Take 40 mg by mouth  daily. 08/15/19  Yes [provider]  loratadine (CLARITIN) 10 MG tablet Take 10 mg by mouth daily.   Yes [provider]  losartan (COZAAR) 25 MG tablet Take 25 mg by mouth daily. 04/03/21  Yes [provider]  metFORMIN (GLUCOPHAGE) 500 MG tablet Take by mouth 2 (two) times daily with a meal.   Yes [provider]  Multiple Vitamins-Minerals (ZINC PO) Take 1 tablet by mouth daily.   Yes [provider]  vitamin C (ASCORBIC ACID) 500 MG tablet Take 500 mg by mouth daily.    Yes [provider]  ALPRAZolam Duanne Moron) 0.5 MG tablet Take 0.25-0.5 mg by mouth daily as needed for anxiety.  Patient not taking: Reported on 04/09/2021    [provider]  Aspirin-Acetaminophen-Caffeine (GOODY HEADACHE PO) Take 1 packet by mouth daily as needed (for pain/headache). Patient not taking: Reported on 04/09/2021    [provider]  benzonatate (TESSALON) 100 MG capsule Take 1 capsule (100 mg total) by mouth every 8 (eight) hours. 10/10/20   Wurst, [place], PA-C  HYDROcodone-acetaminophen (NORCO/VICODIN) 5-325 MG tablet Take 1 tablet by mouth every 6 (six) hours as needed. Patient not taking: Reported on 04/09/2021 07/11/20   Milton Ferguson, MD  predniSONE (STERAPRED UNI-PAK 21 TAB) 10 MG (21) TBPK tablet Take by mouth daily. Take 6 tabs by mouth daily  for 2 days, then 5 tabs for 2 days, then 4 tabs for 2 days, then 3 tabs for 2 days, 2 tabs for 2 days, then 1 tab by mouth daily for 2 days 10/10/20   Stacey Drain, [place], PA-C  traZODone (DESYREL) 100 MG tablet Take 100 mg by mouth at bedtime.  [provider]     Family History  Problem Relation Age of Onset   Coronary artery disease Father    Heart disease Father    Barrett's esophagus Father    Hypertension Mother    Coronary artery disease Paternal Uncle    Coronary artery disease Cousin     Social History   Socioeconomic History   Marital status: Married    Spouse name: Not on file    Number of children: 2   Years of education: Not on file   Highest education level: Not on file  Occupational History   Occupation: property mgmt    Employer: [employer]  Tobacco Use   Smoking status: Former    Types: Cigarettes    Quit date: 05/20/1991    Years since quitting: 29.9   Smokeless tobacco: Never   Tobacco comments:    Used to smoke a pack in 1-2 weeks/ 1992  Vaping Use   Vaping Use: Never used  Substance and Sexual Activity   Alcohol use: Not Currently    Alcohol/week: 0.0 standard drinks    Comment: social   Drug use: No   Sexual activity: Yes    Birth control/protection: Surgical  Other Topics Concern   Not on file  Social History Narrative   Not on file   Social Determinants of Health   Financial Resource Strain: Not on file  Food Insecurity: Not on file  Transportation Needs: Not on file  Physical Activity: Not on file  Stress: Not on file  Social Connections: Not on file      Review of Systems denies fever, headache, chest pain, dyspnea, cough, abdominal/back pain, nausea, vomiting or bleeding.  She is anxious and also has occasional palpitations.  Vital Signs: BP (!) 170/82   Pulse 78   Temp 98.5 F (36.9 C) (Oral)   Resp 18   Ht _0  (1.626 m)   Wt 280 lb (127 kg)   SpO2 97%   BMI 48.06 kg/m   Physical Exam awake, alert.  Chest clear to auscultation bilaterally.  Heart with regular rate and rhythm.  Abdomen soft, positive bowel sounds, nontender.  No lower extremity edema.  Imaging: MM 3D SCREEN BREAST BILATERAL  Result Date: 04/18/2021 CLINICAL DATA:  Screening. New baseline examination. EXAM: DIGITAL SCREENING BILATERAL MAMMOGRAM WITH TOMOSYNTHESIS AND CAD TECHNIQUE: Bilateral screening digital craniocaudal and mediolateral oblique mammograms were obtained. Bilateral screening digital breast tomosynthesis was performed. The images were evaluated with computer-aided detection. COMPARISON:  None. ACR Breast Density Category b:  There are scattered areas of fibroglandular density. FINDINGS: In the right breast, a possible asymmetry warrants further evaluation. In the left breast, no findings suspicious for malignancy. IMPRESSION: Further evaluation is suggested for possible asymmetry in the right breast. RECOMMENDATION: Diagnostic mammogram and possibly ultrasound of the right breast. (Code:[id number]-82M) The patient will be contacted regarding the findings, and additional imaging will be scheduled. BI-RADS CATEGORY  0: Incomplete. Need additional imaging evaluation and/or prior mammograms for comparison. Electronically Signed   By: Margarette Canada M.D.   On: 04/18/2021 15:58    Labs:  CBC: Recent Labs    07/11/20 0622 04/09/21 0954 04/21/21 0720  WBC 2.7* 2.3* 2.5*  HGB 12.5 12.1 12.3  HCT 36.9 36.4 37.4  PLT 64* 55* 57*    COAGS: No results for input(s): INR, APTT in the last 8760 hours.  BMP: Recent Labs    07/11/20 0622  NA 136  K 3.4*  CL 108  CO2  23  GLUCOSE 223*  BUN 8  CALCIUM 9.2  CREATININE 0.57  GFRNONAA >60    LIVER FUNCTION TESTS: No results for input(s): BILITOT, AST, ALT, ALKPHOS, PROT, ALBUMIN in the last 8760 hours.  TUMOR MARKERS: No results for input(s): AFPTM, CEA, CA199, CHROMGRNA in the last 8760 hours.  Assessment and Plan: 55 y.o. female with past medical history of anxiety/depression, GERD, hyperlipidemia, hypertension, hypothyroidism and diabetes.  History also significant for leukopenia/thrombocytopenia and splenomegaly of uncertain etiology.  She is scheduled today for CT-guided bone marrow biopsy for further evaluation.Risks and benefits of procedure was discussed with the patient including, but not limited to bleeding, infection, damage to adjacent structures or low yield requiring additional tests.  All of the questions were answered and there is agreement to proceed.  Consent signed and in chart.    Thank you for this interesting consult.  I greatly enjoyed meeting Candice  B Stanley and look forward to participating in their care.  A copy of this report was sent to the requesting provider on this date.  Electronically Signed: D. Rowe Robert, PA-C 04/21/2021, 8:32 AM   I spent a total of  20 minutes   in face to face in clinical consultation, greater than 50% of which was counseling/coordinating care for CT-guided bone marrow biopsy

## 2021-04-22 LAB — SURGICAL PATHOLOGY

## 2021-04-23 ENCOUNTER — Encounter (HOSPITAL_COMMUNITY): Payer: Self-pay

## 2021-04-26 ENCOUNTER — Encounter (HOSPITAL_COMMUNITY): Payer: Self-pay

## 2021-04-26 NOTE — Progress Notes (Signed)
Follow-up to patient's MyChart message via telephone call. Dr. Ellin Saba reviewed BMBx results, no note of cancer. Patient aware but coming to discuss results further with Dr. Ellin Saba tomorrow at 1415 due to additional questions. Patient appreciative of my call and agreeable for tomorrow's appt.

## 2021-04-26 NOTE — Progress Notes (Signed)
Candice Stanley, Candice Stanley [id number]   CLINIC:  Medical Oncology/Hematology  PCP:  Celene Squibb, MD [address] [place] [address]  [phone]  REASON FOR VISIT:  Follow-up for leukopenia and thrombocytopenia  PRIOR THERAPY: none  CURRENT THERAPY: under work-up  INTERVAL HISTORY:  Candice Stanley, a 55 y.o. female, returns for routine follow-up for her leukopenia and thrombocytopenia. Candice Stanley was last seen on 04/09/2021.  Today she reports feeling well. She denies recent infections. She takes prednisone prn for seasonal allergies.   REVIEW OF SYSTEMS:  Review of Systems  Constitutional:  Positive for fatigue (40%). Negative for appetite change (60%).  Respiratory:  Positive for shortness of breath.   Cardiovascular:  Positive for palpitations.  Neurological:  Positive for headaches.  Psychiatric/Behavioral:  Positive for depression and sleep disturbance. The patient is nervous/anxious.   All other systems reviewed and are negative.  PAST MEDICAL/SURGICAL HISTORY:  Past Medical History:  Diagnosis Date   Anxiety    Depression    GERD (gastroesophageal reflux disease)    Hypercholesteremia    Hypertension    Hypothyroidism    Trimalleolar fracture of ankle, closed, right, sequela    Type 2 diabetes mellitus ([place])    Past Surgical History:  Procedure Laterality Date   ABDOMINAL HYSTERECTOMY     CARDIAC CATHETERIZATION N/A 09/10/2015   Procedure: Left Heart Cath and Coronary Angiography;  Surgeon: Jettie Booze, MD;  Location: [place] [facility];  Service: Cardiovascular;  Laterality: N/A;   CHOLECYSTECTOMY     OPEN REDUCTION INTERNAL FIXATION (ORIF) TIBIA/FIBULA FRACTURE Right 08/04/2016   Procedure: OPEN REDUCTION INTERNAL FIXATION (ORIF) RIGHT TRIMALLEOLAR FRACTURE;  Surgeon: Wylene Simmer, MD;  Location: [place];  Service: Orthopedics;  Laterality: Right;   WRIST SURGERY      SOCIAL HISTORY:  Social  History   Socioeconomic History   Marital status: Married    Spouse name: Not on file   Number of children: 2   Years of education: Not on file   Highest education level: Not on file  Occupational History   Occupation: property mgmt    Employer: [employer]  Tobacco Use   Smoking status: Former    Types: Cigarettes    Quit date: 05/20/1991    Years since quitting: 29.9   Smokeless tobacco: Never   Tobacco comments:    Used to smoke a pack in 1-2 weeks/ 1992  Vaping Use   Vaping Use: Never used  Substance and Sexual Activity   Alcohol use: Not Currently    Alcohol/week: 0.0 standard drinks    Comment: social   Drug use: No   Sexual activity: Yes    Birth control/protection: Surgical  Other Topics Concern   Not on file  Social History Narrative   Not on file   Social Determinants of Health   Financial Resource Strain: Not on file  Food Insecurity: Not on file  Transportation Needs: Not on file  Physical Activity: Not on file  Stress: Not on file  Social Connections: Not on file  Intimate Partner Violence: Not on file    FAMILY HISTORY:  Family History  Problem Relation Age of Onset   Coronary artery disease Father    Heart disease Father    Barrett's esophagus Father    Hypertension Mother    Coronary artery disease Paternal Uncle    Coronary artery disease Cousin     CURRENT MEDICATIONS:  Current Outpatient Medications  Medication Sig Dispense Refill   ALPRAZolam (XANAX) 0.5 MG tablet Take 0.25-0.5 mg by mouth daily as needed for anxiety.  (Patient not taking: Reported on 04/09/2021)     Aspirin-Acetaminophen-Caffeine (GOODY HEADACHE PO) Take 1 packet by mouth daily as needed (for pain/headache). (Patient not taking: Reported on 04/09/2021)     benzonatate (TESSALON) 100 MG capsule Take 1 capsule (100 mg total) by mouth every 8 (eight) hours. 21 capsule 0   dexlansoprazole (DEXILANT) 60 MG capsule Take 1 capsule (60 mg total) by mouth daily. 31 capsule 2    famotidine (PEPCID) 20 MG tablet Take 20 mg by mouth at bedtime.     FLUoxetine (PROZAC) 40 MG capsule Take 40 mg by mouth daily.     HYDROcodone-acetaminophen (NORCO/VICODIN) 5-325 MG tablet Take 1 tablet by mouth every 6 (six) hours as needed. (Patient not taking: Reported on 04/09/2021) 20 tablet 0   loratadine (CLARITIN) 10 MG tablet Take 10 mg by mouth daily.     losartan (COZAAR) 25 MG tablet Take 25 mg by mouth daily.     metFORMIN (GLUCOPHAGE) 500 MG tablet Take by mouth 2 (two) times daily with a meal.     Multiple Vitamins-Minerals (ZINC PO) Take 1 tablet by mouth daily.     predniSONE (STERAPRED UNI-PAK 21 TAB) 10 MG (21) TBPK tablet Take by mouth daily. Take 6 tabs by mouth daily  for 2 days, then 5 tabs for 2 days, then 4 tabs for 2 days, then 3 tabs for 2 days, 2 tabs for 2 days, then 1 tab by mouth daily for 2 days 42 tablet 0   traZODone (DESYREL) 100 MG tablet Take 100 mg by mouth at bedtime.     vitamin C (ASCORBIC ACID) 500 MG tablet Take 500 mg by mouth daily.      No current facility-administered medications for this visit.    ALLERGIES:  Allergies  Allergen Reactions   Codeine Nausea And Vomiting   Dilaudid [Hydromorphone Hcl]     Drop in blood pressure   Tape Swelling and Other (See Comments)    Rash(paper tape)   Vicodin [Hydrocodone-Acetaminophen] Nausea And Vomiting   Latex Rash    PHYSICAL EXAM:  Performance status (ECOG): 0 - Asymptomatic  There were no vitals filed for this visit. Wt Readings from Last 3 Encounters:  04/21/21 280 lb (127 kg)  07/11/20 250 lb (113.4 kg)  02/11/20 287 lb 3.2 oz (130.3 kg)   Physical Exam Vitals reviewed.  Constitutional:      Appearance: Normal appearance.  Cardiovascular:     Rate and Rhythm: Normal rate and regular rhythm.     Pulses: Normal pulses.     Heart sounds: Normal heart sounds.  Pulmonary:     Effort: Pulmonary effort is normal.     Breath sounds: Normal breath sounds.  Neurological:     General: No  focal deficit present.     Mental Status: She is alert and oriented to person, place, and time.  Psychiatric:        Mood and Affect: Mood normal.        Behavior: Behavior normal.    LABORATORY DATA:  I have reviewed the labs as listed.  CBC Latest Ref Rng & Units 04/21/2021 04/09/2021 07/11/2020  WBC 4.0 - 10.5 K/uL 2.5(L) 2.3(L) 2.7(L)  Hemoglobin 12.0 - 15.0 g/dL 12.3 12.1 12.5  Hematocrit 36.0 - 46.0 % 37.4 36.4 36.9  Platelets 150 - 400 K/uL 57(L) 55(L)  64(L)   CMP Latest Ref Rng & Units 07/11/2020 02/07/2020 09/25/2019  Glucose 70 - 99 mg/dL 223(H) 165(H) 156(H)  BUN 6 - 20 mg/dL '8 8 7  ' Creatinine 0.44 - 1.00 mg/dL 0.57 0.65 0.63  Sodium 135 - 145 mmol/L 136 139 138  Potassium 3.5 - 5.1 mmol/L 3.4(L) 3.4(L) 3.3(L)  Chloride 98 - 111 mmol/L 108 108 105  CO2 22 - 32 mmol/L '23 23 24  ' Calcium 8.9 - 10.3 mg/dL 9.2 9.5 9.4  Total Protein 6.5 - 8.1 g/dL - - -  Total Bilirubin 0.3 - 1.2 mg/dL - - -  Alkaline Phos 38 - 126 U/L - - -  AST 15 - 41 U/L - - -  ALT 0 - 44 U/L - - -      Component Value Date/Time   RBC 3.96 04/21/2021 0720   MCV 94.4 04/21/2021 0720   MCH 31.1 04/21/2021 0720   MCHC 32.9 04/21/2021 0720   RDW 14.1 04/21/2021 0720   LYMPHSABS 1.1 04/21/2021 0720   MONOABS 0.2 04/21/2021 0720   EOSABS 0.0 04/21/2021 0720   BASOSABS 0.0 04/21/2021 0720    DIAGNOSTIC IMAGING:  I have independently reviewed the scans and discussed with the patient. CT Biopsy  Result Date: 04/21/2021 CLINICAL DATA:  Pancytopenia. EXAM: CT GUIDED BONE MARROW ASPIRATION AND BIOPSY ANESTHESIA/SEDATION: Versed 4.0 mg IV, Fentanyl 100 mcg IV Total Moderate Sedation Time:   14 minutes. The patient's level of consciousness and physiologic status were continuously monitored during the procedure by Radiology nursing. PROCEDURE: The procedure risks, benefits, and alternatives were explained to the patient. Questions regarding the procedure were encouraged and answered. The patient understands and  consents to the procedure. A time out was performed prior to initiating the procedure. The right gluteal region was prepped with chlorhexidine. Sterile gown and sterile gloves were used for the procedure. Local anesthesia was provided with 1% Lidocaine. Under CT guidance, an 11 gauge On Control bone cutting needle was advanced from a posterior approach into the right iliac bone. Needle positioning was confirmed with CT. Initial non heparinized and heparinized aspirate samples were obtained of bone marrow. Core biopsy was performed via the On Control drill needle. COMPLICATIONS: None FINDINGS: Inspection of initial aspirate did reveal visible particles. Intact core biopsy sample was obtained. IMPRESSION: CT guided bone marrow biopsy of right posterior iliac bone with both aspirate and core samples obtained. Electronically Signed   By: Aletta Edouard M.D.   On: 04/21/2021 11:11   CT BONE MARROW BIOPSY  Result Date: 04/21/2021 CLINICAL DATA:  Pancytopenia. EXAM: CT GUIDED BONE MARROW ASPIRATION AND BIOPSY ANESTHESIA/SEDATION: Versed 4.0 mg IV, Fentanyl 100 mcg IV Total Moderate Sedation Time:   14 minutes. The patient's level of consciousness and physiologic status were continuously monitored during the procedure by Radiology nursing. PROCEDURE: The procedure risks, benefits, and alternatives were explained to the patient. Questions regarding the procedure were encouraged and answered. The patient understands and consents to the procedure. A time out was performed prior to initiating the procedure. The right gluteal region was prepped with chlorhexidine. Sterile gown and sterile gloves were used for the procedure. Local anesthesia was provided with 1% Lidocaine. Under CT guidance, an 11 gauge On Control bone cutting needle was advanced from a posterior approach into the right iliac bone. Needle positioning was confirmed with CT. Initial non heparinized and heparinized aspirate samples were obtained of bone  marrow. Core biopsy was performed via the On Control drill needle. COMPLICATIONS: None FINDINGS: Inspection  of initial aspirate did reveal visible particles. Intact core biopsy sample was obtained. IMPRESSION: CT guided bone marrow biopsy of right posterior iliac bone with both aspirate and core samples obtained. Electronically Signed   By: Aletta Edouard M.D.   On: 04/21/2021 11:11   MM 3D SCREEN BREAST BILATERAL  Result Date: 04/18/2021 CLINICAL DATA:  Screening. New baseline examination. EXAM: DIGITAL SCREENING BILATERAL MAMMOGRAM WITH TOMOSYNTHESIS AND CAD TECHNIQUE: Bilateral screening digital craniocaudal and mediolateral oblique mammograms were obtained. Bilateral screening digital breast tomosynthesis was performed. The images were evaluated with computer-aided detection. COMPARISON:  None. ACR Breast Density Category b: There are scattered areas of fibroglandular density. FINDINGS: In the right breast, a possible asymmetry warrants further evaluation. In the left breast, no findings suspicious for malignancy. IMPRESSION: Further evaluation is suggested for possible asymmetry in the right breast. RECOMMENDATION: Diagnostic mammogram and possibly ultrasound of the right breast. (Code:[id number]-38M) The patient will be contacted regarding the findings, and additional imaging will be scheduled. BI-RADS CATEGORY  0: Incomplete. Need additional imaging evaluation and/or prior mammograms for comparison. Electronically Signed   By: Margarette Canada M.D.   On: 04/18/2021 15:58     ASSESSMENT:  1.  Leukopenia and thrombocytopenia: - Patient seen at the request of Dr. Juel Burrow office. - CBC on 03/31/2021 with white count 2.1, platelet count 48, ANC 900.  Hemoglobin was 11.7. - CBC on 12/30/2020 white count 2.5, platelet count 57, hemoglobin 12.5, ANC 1.2. - Ultrasound abdomen on 06/05/2018 with increased hepatic echogenicity, fatty infiltration and splenomegaly measuring 18.8 cm, volume 1943. - No bleeding although  easy bruising.  No B symptoms or infections.  No history of transfusions or hepatitis.  2.  Social/family history: - Lives at home with her husband.  She works as a Secondary school teacher.  She is accompanied by her daughter today.  Denies any chemical exposure.  She is a non-smoker. - Her mother hadsNash cirrhosis.  Maternal grandmother had breast cancer.  Paternal aunt had breast cancer.   PLAN:  1.  Leukopenia and thrombocytopenia: - Progressive worsening of leukopenia and thrombocytopenia since 2018. - Reviewed bone marrow biopsy results from 04/21/2021 which showed slightly hypercellular bone marrow with trilineage hematopoiesis.  Slight lymphocytosis of primarily small lymphoid cells with lack of significant aggregates. - Chromosome analysis is pending at this time. - CBC on 04/21/2021 shows white count 2.5, ANC 1.2, platelet count 57.  Hemoglobin was normal. - Most likely etiology is splenomegaly.  Differential also includes immune mediated cytopenias. - Recommend dexamethasone 40 mg for 4 days to see if she responds.  RTC 05/06/2021 with repeat CBC.  2.  Health maintenance: - We have reviewed mammogram from 04/14/2021 which was BI-RADS Category 0 based on possible asymmetry in the right breast. - She was told to proceed with diagnostic right mammogram and ultrasound.  Orders placed this encounter:  No orders of the defined types were placed in this encounter.    Derek Jack, MD [place] [phone]   I, Thana Ates, am acting as a scribe for Dr. Derek Jack.  I, Derek Jack MD, have reviewed the above documentation for accuracy and completeness, and I agree with the above.

## 2021-04-27 ENCOUNTER — Inpatient Hospital Stay (HOSPITAL_COMMUNITY): Payer: Self-pay | Attending: Hematology | Admitting: Hematology

## 2021-04-27 ENCOUNTER — Other Ambulatory Visit: Payer: Self-pay

## 2021-04-27 VITALS — BP 163/90 | HR 85 | Temp 97.9°F | Resp 20 | Wt 274.5 lb

## 2021-04-27 DIAGNOSIS — D696 Thrombocytopenia, unspecified: Secondary | ICD-10-CM | POA: Insufficient documentation

## 2021-04-27 DIAGNOSIS — Z79899 Other long term (current) drug therapy: Secondary | ICD-10-CM | POA: Insufficient documentation

## 2021-04-27 DIAGNOSIS — Z7984 Long term (current) use of oral hypoglycemic drugs: Secondary | ICD-10-CM | POA: Insufficient documentation

## 2021-04-27 DIAGNOSIS — D72819 Decreased white blood cell count, unspecified: Secondary | ICD-10-CM | POA: Insufficient documentation

## 2021-04-27 DIAGNOSIS — D61818 Other pancytopenia: Secondary | ICD-10-CM

## 2021-04-27 DIAGNOSIS — Z87891 Personal history of nicotine dependence: Secondary | ICD-10-CM | POA: Insufficient documentation

## 2021-04-27 DIAGNOSIS — Z7952 Long term (current) use of systemic steroids: Secondary | ICD-10-CM | POA: Insufficient documentation

## 2021-04-27 MED ORDER — DEXAMETHASONE 4 MG PO TABS: 40.0000 mg | ORAL_TABLET | Freq: Every day | ORAL | 0 refills | Status: AC

## 2021-04-27 NOTE — Patient Instructions (Signed)
[facility] at [facility] Discharge Instructions  You were seen and examined today by Dr. Ellin Saba. He is starting you on Decadron 40 mg  you will take 10 pills once daily for 4 days. This is to see if your platelets will increase with steroids and let us know if you have an autoimmune response. Please follow up as scheduled.   Thank you for choosing  Cancer Center at [facility] to provide your oncology and hematology care.  To afford each patient quality time with our provider, please arrive at least 15 minutes before your scheduled appointment time.   If you have a lab appointment with the Cancer Center please come in thru the Main Entrance and check in at the main information desk.  You need to re-schedule your appointment should you arrive 10 or more minutes late.  We strive to give you quality time with our providers, and arriving late affects you and other patients whose appointments are after yours.  Also, if you no show three or more times for appointments you may be dismissed from the clinic at the providers discretion.     Again, thank you for choosing [facility].  Our hope is that these requests will decrease the amount of time that you wait before being seen by our physicians.       _____________________________________________________________  Should you have questions after your visit to [facility], please contact our office at [phone] and follow the prompts.  Our office hours are 8:00 a.m. and 4:30 p.m. Monday - Friday.  Please note that voicemails left after 4:00 p.m. may not be returned until the following business day.  We are closed weekends and major holidays.  You do have access to a nurse 24-7, just call the main number to the clinic [phone] and do not press any options, hold on the line and a nurse will answer the phone.    For prescription refill requests, have your pharmacy contact our office  and allow 72 hours.    Due to Covid, you will need to wear a mask upon entering the hospital. If you do not have a mask, a mask will be given to you at the Main Entrance upon arrival. For doctor visits, patients may have 1 support person age 47 or older with them. For treatment visits, patients can not have anyone with them due to social distancing guidelines and our immunocompromised population.

## 2021-04-29 ENCOUNTER — Encounter (HOSPITAL_COMMUNITY): Payer: Self-pay | Admitting: Hematology

## 2021-05-03 ENCOUNTER — Telehealth (HOSPITAL_COMMUNITY): Payer: Self-pay | Admitting: *Deleted

## 2021-05-03 ENCOUNTER — Other Ambulatory Visit (HOSPITAL_COMMUNITY): Payer: Self-pay | Admitting: *Deleted

## 2021-05-03 DIAGNOSIS — E119 Type 2 diabetes mellitus without complications: Secondary | ICD-10-CM

## 2021-05-03 NOTE — Telephone Encounter (Signed)
Per Dr Scharlene Gloss office, okay to add to labs for this patient for him to review.

## 2021-05-05 ENCOUNTER — Inpatient Hospital Stay (HOSPITAL_COMMUNITY): Payer: Self-pay

## 2021-05-05 ENCOUNTER — Ambulatory Visit (HOSPITAL_COMMUNITY)
Admission: RE | Admit: 2021-05-05 | Discharge: 2021-05-05 | Disposition: A | Discharge status: Home / Self Care | Payer: Self-pay | Source: Ambulatory Visit | Attending: Hematology | Admitting: Hematology

## 2021-05-05 ENCOUNTER — Other Ambulatory Visit: Payer: Self-pay

## 2021-05-05 DIAGNOSIS — N6489 Other specified disorders of breast: Secondary | ICD-10-CM

## 2021-05-05 DIAGNOSIS — E119 Type 2 diabetes mellitus without complications: Secondary | ICD-10-CM

## 2021-05-05 DIAGNOSIS — R928 Other abnormal and inconclusive findings on diagnostic imaging of breast: Secondary | ICD-10-CM | POA: Insufficient documentation

## 2021-05-05 DIAGNOSIS — D61818 Other pancytopenia: Secondary | ICD-10-CM

## 2021-05-05 LAB — CBC
HCT: 39.8 % (ref 36.0–46.0)
Hemoglobin: 13.4 g/dL (ref 12.0–15.0)
MCH: 31.3 pg (ref 26.0–34.0)
MCHC: 33.7 g/dL (ref 30.0–36.0)
MCV: 93 fL (ref 80.0–100.0)
Platelets: 54 10*3/uL — ABNORMAL LOW (ref 150–400)
RBC: 4.28 MIL/uL (ref 3.87–5.11)
RDW: 13.9 % (ref 11.5–15.5)
WBC: 4.8 10*3/uL (ref 4.0–10.5)
nRBC: 0 % (ref 0.0–0.2)

## 2021-05-05 LAB — HEMOGLOBIN A1C
Hgb A1c MFr Bld: 6.4 % — ABNORMAL HIGH (ref 4.8–5.6)
Mean Plasma Glucose: 136.98 mg/dL

## 2021-05-05 NOTE — Progress Notes (Signed)
Candice Stanley,  [id number]   CLINIC:  Medical Oncology/Hematology  PCP:  Celene Squibb, MD [address] Candice Stanley [place] [address]  [phone]  REASON FOR VISIT:  Follow-up for leukopenia and thrombocytopenia  PRIOR THERAPY: none  CURRENT THERAPY: surveillance  INTERVAL HISTORY:  [name], a 55 y.o. female, returns for routine follow-up for her leukopenia and thrombocytopenia. Sarabi was last seen on 04/27/2021.  Today she reports feeling well. She reports mild fatigue, and she denies constipation and diarrhea.   REVIEW OF SYSTEMS:  Review of Systems  Constitutional:  Positive for fatigue (40%). Negative for appetite change (60%).  Respiratory:  Positive for shortness of breath.   Gastrointestinal:  Positive for abdominal pain (4/10 stomach). Negative for constipation and diarrhea.  Psychiatric/Behavioral:  The patient is nervous/anxious.   All other systems reviewed and are negative.  PAST MEDICAL/SURGICAL HISTORY:  Past Medical History:  Diagnosis Date   Anxiety    Depression    GERD (gastroesophageal reflux disease)    Hypercholesteremia    Hypertension    Hypothyroidism    Trimalleolar fracture of ankle, closed, right, sequela    Type 2 diabetes mellitus ([place])    Past Surgical History:  Procedure Laterality Date   ABDOMINAL HYSTERECTOMY     CARDIAC CATHETERIZATION N/A 09/10/2015   Procedure: Left Heart Cath and Coronary Angiography;  Surgeon: Jettie Booze, MD;  Location: [facility];  Service: Cardiovascular;  Laterality: N/A;   CHOLECYSTECTOMY     OPEN REDUCTION INTERNAL FIXATION (ORIF) TIBIA/FIBULA FRACTURE Right 08/04/2016   Procedure: OPEN REDUCTION INTERNAL FIXATION (ORIF) RIGHT TRIMALLEOLAR FRACTURE;  Surgeon: Wylene Simmer, MD;  Location: [facility];  Service: Orthopedics;  Laterality: Right;   WRIST SURGERY      SOCIAL HISTORY:  Social History   Socioeconomic History    Marital status: Married    Spouse name: Not on file   Number of children: 2   Years of education: Not on file   Highest education level: Not on file  Occupational History   Occupation: property mgmt    Employer: [employer]  Tobacco Use   Smoking status: Former    Types: Cigarettes    Quit date: 05/20/1991    Years since quitting: 29.9   Smokeless tobacco: Never   Tobacco comments:    Used to smoke a pack in 1-2 weeks/ 1992  Vaping Use   Vaping Use: Never used  Substance and Sexual Activity   Alcohol use: Not Currently    Alcohol/week: 0.0 standard drinks    Comment: social   Drug use: No   Sexual activity: Yes    Birth control/protection: Surgical  Other Topics Concern   Not on file  Social History Narrative   Not on file   Social Determinants of Health   Financial Resource Strain: Not on file  Food Insecurity: Not on file  Transportation Needs: Not on file  Physical Activity: Not on file  Stress: Not on file  Social Connections: Not on file  Intimate Partner Violence: Not on file    FAMILY HISTORY:  Family History  Problem Relation Age of Onset   Coronary artery disease Father    Heart disease Father    Barrett's esophagus Father    Hypertension Mother    Coronary artery disease Paternal Uncle    Coronary artery disease Cousin     CURRENT MEDICATIONS:  Current Outpatient Medications  Medication  Sig Dispense Refill   ALPRAZolam (XANAX) 0.5 MG tablet Take 0.25-0.5 mg by mouth daily as needed for anxiety.  (Patient not taking: Reported on 04/27/2021)     Aspirin-Acetaminophen-Caffeine (GOODY HEADACHE PO) Take 1 packet by mouth daily as needed (for pain/headache). (Patient not taking: Reported on 04/27/2021)     benzonatate (TESSALON) 100 MG capsule Take 1 capsule (100 mg total) by mouth every 8 (eight) hours. 21 capsule 0   dexlansoprazole (DEXILANT) 60 MG capsule Take 1 capsule (60 mg total) by mouth daily. 31 capsule 2   famotidine (PEPCID) 20 MG tablet  Take 20 mg by mouth at bedtime.     FLUoxetine (PROZAC) 40 MG capsule Take 40 mg by mouth daily.     HYDROcodone-acetaminophen (NORCO/VICODIN) 5-325 MG tablet Take 1 tablet by mouth every 6 (six) hours as needed. (Patient not taking: Reported on 04/27/2021) 20 tablet 0   loratadine (CLARITIN) 10 MG tablet Take 10 mg by mouth daily.     losartan (COZAAR) 25 MG tablet Take 25 mg by mouth daily.     metFORMIN (GLUCOPHAGE) 500 MG tablet Take by mouth 2 (two) times daily with a meal.     Multiple Vitamins-Minerals (ZINC PO) Take 1 tablet by mouth daily.     predniSONE (STERAPRED UNI-PAK 21 TAB) 10 MG (21) TBPK tablet Take by mouth daily. Take 6 tabs by mouth daily  for 2 days, then 5 tabs for 2 days, then 4 tabs for 2 days, then 3 tabs for 2 days, 2 tabs for 2 days, then 1 tab by mouth daily for 2 days 42 tablet 0   traZODone (DESYREL) 100 MG tablet Take 100 mg by mouth at bedtime.     vitamin C (ASCORBIC ACID) 500 MG tablet Take 500 mg by mouth daily.      No current facility-administered medications for this visit.    ALLERGIES:  Allergies  Allergen Reactions   Codeine Nausea And Vomiting   Dilaudid [Hydromorphone Hcl]     Drop in blood pressure   Tape Swelling and Other (See Comments)    Rash(paper tape)   Vicodin [Hydrocodone-Acetaminophen] Nausea And Vomiting   Latex Rash    PHYSICAL EXAM:  Performance status (ECOG): 0 - Asymptomatic  There were no vitals filed for this visit. Wt Readings from Last 3 Encounters:  04/27/21 274 lb 7.6 oz (124.5 kg)  04/21/21 280 lb (127 kg)  07/11/20 250 lb (113.4 kg)   Physical Exam Vitals reviewed.  Constitutional:      Appearance: Normal appearance. She is obese.  Cardiovascular:     Rate and Rhythm: Normal rate and regular rhythm.     Pulses: Normal pulses.     Heart sounds: Normal heart sounds.  Pulmonary:     Effort: Pulmonary effort is normal.     Breath sounds: Normal breath sounds.  Neurological:     General: No focal deficit  present.     Mental Status: She is alert and oriented to person, place, and time.  Psychiatric:        Mood and Affect: Mood normal.        Behavior: Behavior normal.    LABORATORY DATA:  I have reviewed the labs as listed.  CBC Latest Ref Rng & Units 04/21/2021 04/09/2021 07/11/2020  WBC 4.0 - 10.5 K/uL 2.5(L) 2.3(L) 2.7(L)  Hemoglobin 12.0 - 15.0 g/dL 12.3 12.1 12.5  Hematocrit 36.0 - 46.0 % 37.4 36.4 36.9  Platelets 150 - 400 K/uL 57(L) 55(L) 64(L)  CMP Latest Ref Rng & Units 07/11/2020 02/07/2020 09/25/2019  Glucose 70 - 99 mg/dL 223(H) 165(H) 156(H)  BUN 6 - 20 mg/dL '8 8 7  ' Creatinine 0.44 - 1.00 mg/dL 0.57 0.65 0.63  Sodium 135 - 145 mmol/L 136 139 138  Potassium 3.5 - 5.1 mmol/L 3.4(L) 3.4(L) 3.3(L)  Chloride 98 - 111 mmol/L 108 108 105  CO2 22 - 32 mmol/L '23 23 24  ' Calcium 8.9 - 10.3 mg/dL 9.2 9.5 9.4  Total Protein 6.5 - 8.1 g/dL - - -  Total Bilirubin 0.3 - 1.2 mg/dL - - -  Alkaline Phos 38 - 126 U/L - - -  AST 15 - 41 U/L - - -  ALT 0 - 44 U/L - - -      Component Value Date/Time   RBC 3.96 04/21/2021 0720   MCV 94.4 04/21/2021 0720   MCH 31.1 04/21/2021 0720   MCHC 32.9 04/21/2021 0720   RDW 14.1 04/21/2021 0720   LYMPHSABS 1.1 04/21/2021 0720   MONOABS 0.2 04/21/2021 0720   EOSABS 0.0 04/21/2021 0720   BASOSABS 0.0 04/21/2021 0720    DIAGNOSTIC IMAGING:  I have independently reviewed the scans and discussed with the patient. CT Biopsy  Result Date: 04/21/2021 CLINICAL DATA:  Pancytopenia. EXAM: CT GUIDED BONE MARROW ASPIRATION AND BIOPSY ANESTHESIA/SEDATION: Versed 4.0 mg IV, Fentanyl 100 mcg IV Total Moderate Sedation Time:   14 minutes. The patient's level of consciousness and physiologic status were continuously monitored during the procedure by Radiology nursing. PROCEDURE: The procedure risks, benefits, and alternatives were explained to the patient. Questions regarding the procedure were encouraged and answered. The patient understands and consents to the  procedure. A time out was performed prior to initiating the procedure. The right gluteal region was prepped with chlorhexidine. Sterile gown and sterile gloves were used for the procedure. Local anesthesia was provided with 1% Lidocaine. Under CT guidance, an 11 gauge On Control bone cutting needle was advanced from a posterior approach into the right iliac bone. Needle positioning was confirmed with CT. Initial non heparinized and heparinized aspirate samples were obtained of bone marrow. Core biopsy was performed via the On Control drill needle. COMPLICATIONS: None FINDINGS: Inspection of initial aspirate did reveal visible particles. Intact core biopsy sample was obtained. IMPRESSION: CT guided bone marrow biopsy of right posterior iliac bone with both aspirate and core samples obtained. Electronically Signed   By: Aletta Edouard M.D.   On: 04/21/2021 11:11   CT BONE MARROW BIOPSY  Result Date: 04/21/2021 CLINICAL DATA:  Pancytopenia. EXAM: CT GUIDED BONE MARROW ASPIRATION AND BIOPSY ANESTHESIA/SEDATION: Versed 4.0 mg IV, Fentanyl 100 mcg IV Total Moderate Sedation Time:   14 minutes. The patient's level of consciousness and physiologic status were continuously monitored during the procedure by Radiology nursing. PROCEDURE: The procedure risks, benefits, and alternatives were explained to the patient. Questions regarding the procedure were encouraged and answered. The patient understands and consents to the procedure. A time out was performed prior to initiating the procedure. The right gluteal region was prepped with chlorhexidine. Sterile gown and sterile gloves were used for the procedure. Local anesthesia was provided with 1% Lidocaine. Under CT guidance, an 11 gauge On Control bone cutting needle was advanced from a posterior approach into the right iliac bone. Needle positioning was confirmed with CT. Initial non heparinized and heparinized aspirate samples were obtained of bone marrow. Core biopsy  was performed via the On Control drill needle. COMPLICATIONS: None FINDINGS: Inspection of initial aspirate  did reveal visible particles. Intact core biopsy sample was obtained. IMPRESSION: CT guided bone marrow biopsy of right posterior iliac bone with both aspirate and core samples obtained. Electronically Signed   By: Aletta Edouard M.D.   On: 04/21/2021 11:11   MM 3D SCREEN BREAST BILATERAL  Result Date: 04/18/2021 CLINICAL DATA:  Screening. New baseline examination. EXAM: DIGITAL SCREENING BILATERAL MAMMOGRAM WITH TOMOSYNTHESIS AND CAD TECHNIQUE: Bilateral screening digital craniocaudal and mediolateral oblique mammograms were obtained. Bilateral screening digital breast tomosynthesis was performed. The images were evaluated with computer-aided detection. COMPARISON:  None. ACR Breast Density Category b: There are scattered areas of fibroglandular density. FINDINGS: In the right breast, a possible asymmetry warrants further evaluation. In the left breast, no findings suspicious for malignancy. IMPRESSION: Further evaluation is suggested for possible asymmetry in the right breast. RECOMMENDATION: Diagnostic mammogram and possibly ultrasound of the right breast. (Code:[id number]) The patient will be contacted regarding the findings, and additional imaging will be scheduled. BI-RADS CATEGORY  0: Incomplete. Need additional imaging evaluation and/or prior mammograms for comparison. Electronically Signed   By: Margarette Canada M.D.   On: 04/18/2021 15:58     ASSESSMENT:  1.  Leukopenia and thrombocytopenia: - Patient seen at the request of Dr. Juel Burrow office. - CBC on 03/31/2021 with white count 2.1, platelet count 48, ANC 900.  Hemoglobin was 11.7. - CBC on 12/30/2020 white count 2.5, platelet count 57, hemoglobin 12.5, ANC 1.2. - Ultrasound abdomen on 06/05/2018 with increased hepatic echogenicity, fatty infiltration and splenomegaly measuring 18.8 cm, volume 1943. - No bleeding although easy bruising.  No B  symptoms or infections.  No history of transfusions or hepatitis. - Progressive thrombocytopenia and leukopenia since 2018. - BMBX on 04/21/2021 showed slightly hypercellular bone marrow with trilineage hematopoiesis.  Slight lymphocytosis of primarily small lymphoid cells with lack of significant aggregates.  Chromosome analysis is normal.  2.  Social/family history: - Lives at home with her husband.  She works as a Secondary school teacher.  She is accompanied by her daughter today.  Denies any chemical exposure.  She is a non-smoker. - Her mother hadsNash cirrhosis.  Maternal grandmother had breast cancer.  Paternal aunt had breast cancer.   PLAN:  1.  Leukopenia and thrombocytopenia: - She has progressive worsening of leukopenia and thrombocytopenia since 2018. - Leukopenia and thrombocytopenia likely from splenomegaly. - I have given a trial of dexamethasone 40 mg for 4 days last week.  We repeated CBC yesterday which showed platelet count did not improve.  It remained around 54.  White count improved to 4.8. - We will closely monitor platelet count in 4 months. - If there is any worsening of platelet count to around 30 K, consider splenic embolization.   2.  Health maintenance: - Mammogram on 04/14/2021 was BI-RADS Category 0 based on possible asymmetry in the right breast. - Right breast ultrasound on 05/05/2021 showed 8 mm probable benign complicated cyst in the 4 o'clock position of the right breast.  Repeat right breast ultrasound was recommended in 6 months.  Orders placed this encounter:  No orders of the defined types were placed in this encounter.    Derek Jack, MD [place] [phone]   I, Thana Ates, am acting as a scribe for Dr. Derek Jack.  I, Derek Jack MD, have reviewed the above documentation for accuracy and completeness, and I agree with the above.

## 2021-05-06 ENCOUNTER — Other Ambulatory Visit (HOSPITAL_COMMUNITY): Payer: Self-pay

## 2021-05-06 ENCOUNTER — Inpatient Hospital Stay (HOSPITAL_BASED_OUTPATIENT_CLINIC_OR_DEPARTMENT_OTHER): Payer: Self-pay | Admitting: Hematology

## 2021-05-06 VITALS — BP 118/62 | HR 77 | Temp 98.0°F | Resp 18 | Wt 287.3 lb

## 2021-05-06 DIAGNOSIS — D61818 Other pancytopenia: Secondary | ICD-10-CM

## 2021-05-06 NOTE — Patient Instructions (Signed)
[facility] at [facility] Discharge Instructions  You were seen and examined today by Dr. Ellin Saba. He reviewed your most recent labs. Please follow up as scheduled.   Thank you for choosing [facility] at [facility] to provide your oncology and hematology care.  To afford each patient quality time with our provider, please arrive at least 15 minutes before your scheduled appointment time.   If you have a lab appointment with the Cancer Center please come in thru the Main Entrance and check in at the main information desk.  You need to re-schedule your appointment should you arrive 10 or more minutes late.  We strive to give you quality time with our providers, and arriving late affects you and other patients whose appointments are after yours.  Also, if you no show three or more times for appointments you may be dismissed from the clinic at the providers discretion.     Again, thank you for choosing [facility].  Our hope is that these requests will decrease the amount of time that you wait before being seen by our physicians.       _____________________________________________________________  Should you have questions after your visit to [facility], please contact our office at [phone] and follow the prompts.  Our office hours are 8:00 a.m. and 4:30 p.m. Monday - Friday.  Please note that voicemails left after 4:00 p.m. may not be returned until the following business day.  We are closed weekends and major holidays.  You do have access to a nurse 24-7, just call the main number to the clinic [phone] and do not press any options, hold on the line and a nurse will answer the phone.    For prescription refill requests, have your pharmacy contact our office and allow 72 hours.    Due to Covid, you will need to wear a mask upon entering the hospital. If you do not have a mask, a mask will be given to you at the  Main Entrance upon arrival. For doctor visits, patients may have 1 support person age 24 or older with them. For treatment visits, patients can not have anyone with them due to social distancing guidelines and our immunocompromised population.

## 2021-05-10 ENCOUNTER — Ambulatory Visit (HOSPITAL_COMMUNITY): Payer: Self-pay | Admitting: Hematology

## 2021-05-12 ENCOUNTER — Encounter (HOSPITAL_COMMUNITY): Payer: Self-pay

## 2021-05-12 ENCOUNTER — Other Ambulatory Visit (HOSPITAL_COMMUNITY): Payer: Self-pay

## 2021-05-12 MED ORDER — PANTOPRAZOLE SODIUM 40 MG PO TBEC: 40.0000 mg | DELAYED_RELEASE_TABLET | Freq: Every day | ORAL | 0 refills | Status: DC

## 2021-06-05 ENCOUNTER — Telehealth: Payer: Self-pay | Admitting: Nurse Practitioner

## 2021-06-05 DIAGNOSIS — J208 Acute bronchitis due to other specified organisms: Secondary | ICD-10-CM

## 2021-06-05 MED ORDER — PREDNISONE 10 MG (21) PO TBPK: ORAL_TABLET | Freq: Every day | ORAL | 0 refills | Status: DC

## 2021-06-05 MED ORDER — PROMETHAZINE-DM 6.25-15 MG/5ML PO SYRP
5.0000 mL | ORAL_SOLUTION | Freq: Four times a day (QID) | ORAL | 0 refills | Status: DC | PRN
Start: 2021-06-05 — End: 2021-06-28

## 2021-06-05 NOTE — Patient Instructions (Signed)
Candice Stanley, thank you for joining Claiborne Rigg, NP for today's virtual visit.  While this provider is not your primary care provider (PCP), if your PCP is located in our provider database this encounter information will be shared with them immediately following your visit.  Consent: (Patient) Candice Stanley provided verbal consent for this virtual visit at the beginning of the encounter.  Current Medications:  Current Outpatient Medications:    promethazine-dextromethorphan (PROMETHAZINE-DM) 6.25-15 MG/5ML syrup, Take 5 mLs by mouth 4 (four) times daily as needed for cough., Disp: 240 mL, Rfl: 0   ALPRAZolam (XANAX) 0.5 MG tablet, Take 0.25-0.5 mg by mouth daily as needed for anxiety.  (Patient not taking: Reported on 05/06/2021), Disp: , Rfl:    Aspirin-Acetaminophen-Caffeine (GOODY HEADACHE PO), Take 1 packet by mouth daily as needed (for pain/headache). (Patient not taking: Reported on 05/06/2021), Disp: , Rfl:    benzonatate (TESSALON) 100 MG capsule, Take 1 capsule (100 mg total) by mouth every 8 (eight) hours., Disp: 21 capsule, Rfl: 0   dexlansoprazole (DEXILANT) 60 MG capsule, Take 1 capsule (60 mg total) by mouth daily., Disp: 31 capsule, Rfl: 2   famotidine (PEPCID) 20 MG tablet, Take 20 mg by mouth at bedtime., Disp: , Rfl:    FLUoxetine (PROZAC) 40 MG capsule, Take 40 mg by mouth daily., Disp: , Rfl:    HYDROcodone-acetaminophen (NORCO/VICODIN) 5-325 MG tablet, Take 1 tablet by mouth every 6 (six) hours as needed. (Patient not taking: Reported on 05/06/2021), Disp: 20 tablet, Rfl: 0   loratadine (CLARITIN) 10 MG tablet, Take 10 mg by mouth daily., Disp: , Rfl:    losartan (COZAAR) 25 MG tablet, Take 25 mg by mouth daily., Disp: , Rfl:    metFORMIN (GLUCOPHAGE) 500 MG tablet, Take by mouth 2 (two) times daily with a meal., Disp: , Rfl:    Multiple Vitamins-Minerals (ZINC PO), Take 1 tablet by mouth daily., Disp: , Rfl:    pantoprazole (PROTONIX) 40 MG tablet, Take 1 tablet (40  mg total) by mouth daily., Disp: 30 tablet, Rfl: 0   predniSONE (STERAPRED UNI-PAK 21 TAB) 10 MG (21) TBPK tablet, Take by mouth daily. Take 6 tabs by mouth daily  for 2 days, then 5 tabs for 2 days, then 4 tabs for 2 days, then 3 tabs for 2 days, 2 tabs for 2 days, then 1 tab by mouth daily for 2 days, Disp: 42 tablet, Rfl: 0   traZODone (DESYREL) 100 MG tablet, Take 100 mg by mouth at bedtime., Disp: , Rfl:    vitamin C (ASCORBIC ACID) 500 MG tablet, Take 500 mg by mouth daily. , Disp: , Rfl:    Medications ordered in this encounter:  Meds ordered this encounter  Medications   predniSONE (STERAPRED UNI-PAK 21 TAB) 10 MG (21) TBPK tablet    Sig: Take by mouth daily. Take 6 tabs by mouth daily  for 2 days, then 5 tabs for 2 days, then 4 tabs for 2 days, then 3 tabs for 2 days, 2 tabs for 2 days, then 1 tab by mouth daily for 2 days    Dispense:  42 tablet    Refill:  0    Order Specific Question:   Supervising Provider    Answer:   Eber Hong [[id number]]   promethazine-dextromethorphan (PROMETHAZINE-DM) 6.25-15 MG/5ML syrup    Sig: Take 5 mLs by mouth 4 (four) times daily as needed for cough.    Dispense:  240 mL    Refill:  0    Order Specific Question:   Supervising Provider    Answer:   Eber Hong [[id number]]     *If you need refills on other medications prior to your next appointment, please contact your pharmacy*  Follow-Up: Call back or seek an in-person evaluation if the symptoms worsen or if the condition fails to improve as anticipated.    If you have been instructed to have an in-person evaluation today at a local Urgent Care facility, please use the link below. It will take you to a list of all of our available [facility], including address, phone number and hours of operation. Please do not delay care.  [facility]  If you or a family member do not have a primary care provider, use the link below to schedule a visit and establish care. When you  choose a [facility] primary care physician or advanced practice provider, you gain a long-term partner in health. Find a Primary Care Provider  Learn more about [facility]'s in-office and virtual care options: [facility] - Get Care Now

## 2021-06-05 NOTE — Progress Notes (Signed)
I, Claiborne Rigg, NP, attempted to connect with Candice Stanley; MRN [id number] on 06/05/21 via [facility] to complete a video urgent care visit. The patient was unable to successfully connect to the video platform. As such, the patient was contacted by this provider via phone to complete the encounter.     Virtual Visit Consent   Candice Stanley, you are scheduled for a virtual visit with a [facility] provider today.     Just as with appointments in the office, your consent must be obtained to participate.  Your consent will be active for this visit and any virtual visit you may have with one of our providers in the next 365 days.     If you have a MyChart account, a copy of this consent can be sent to you electronically.  All virtual visits are billed to your insurance company just like a traditional visit in the office.    As this is a virtual visit, video technology does not allow for your provider to perform a traditional examination.  This may limit your provider's ability to fully assess your condition.  If your provider identifies any concerns that need to be evaluated in person or the need to arrange testing (such as labs, EKG, etc.), we will make arrangements to do so.     Although advances in technology are sophisticated, we cannot ensure that it will always work on either your end or our end.  If the connection with a video visit is poor, the visit may have to be switched to a telephone visit.  With either a video or telephone visit, we are not always able to ensure that we have a secure connection.     I need to obtain your verbal consent now.   Are you willing to proceed with your visit today?    Candice Stanley has provided verbal consent on 06/05/2021 for a virtual visit (video or telephone).   Claiborne Rigg, NP   Date: 06/05/2021 6:18 PM   Virtual Visit via Video Note   I, Claiborne Rigg, connected with  Candice Stanley  ([id number], 11/18/1965) on 06/05/21 at  6:30 PM EST by a  video-enabled telemedicine application and verified that I am speaking with the correct person using two identifiers.  Location: Patient: Virtual Visit Location Patient: Home Provider: Virtual Visit Location Provider: Home Office   I discussed the limitations of evaluation and management by telemedicine and the availability of in person appointments. The patient expressed understanding and agreed to proceed.    History of Present Illness: Candice Stanley is a 55 y.o. who identifies as a female who was assigned female at birth, and is being seen today for acute bronchitis.  HPI:  Ongoing dry hacking cough x 2 weeks. Associated symptoms: vocal hoarseness, sore throat. States she gets this every year and usually is prescribed prednisone and cough syrup. Tried OTC cough medications with no relief.  Lozenges ineffective. She is not a smoker. Negative for COVID  Problems:  Patient Active Problem List   Diagnosis Date Noted   Hypothyroidism 06/05/2018   Type 2 diabetes mellitus (HCC) 06/05/2018   Hypophosphatemia 06/05/2018   Depression with anxiety 06/05/2018   Thrombocytopenia (HCC) 06/05/2018   Leukopenia 06/05/2018   Hypokalemia 06/05/2018   Chronic cough 12/06/2016   Precordial pain    Family history of early CAD    Chest pain 09/02/2015   Dysphagia 04/16/2012   Odynophagia 04/16/2012   Unspecified hypothyroidism 11/03/2009  Hyperlipidemia 11/03/2009   OBESITY, UNSPECIFIED 11/03/2009   DYSTHYMIC DISORDER 11/03/2009   Esophageal reflux 11/03/2009   Diaphragmatic hernia 11/03/2009   CHEST PAIN UNSPECIFIED 11/03/2009    Allergies:  Allergies  Allergen Reactions   Codeine Nausea And Vomiting   Dilaudid [Hydromorphone Hcl]     Drop in blood pressure   Tape Swelling and Other (See Comments)    Rash(paper tape)   Vicodin [Hydrocodone-Acetaminophen] Nausea And Vomiting   Latex Rash   Medications:  Current Outpatient Medications:    promethazine-dextromethorphan  (PROMETHAZINE-DM) 6.25-15 MG/5ML syrup, Take 5 mLs by mouth 4 (four) times daily as needed for cough., Disp: 240 mL, Rfl: 0   ALPRAZolam (XANAX) 0.5 MG tablet, Take 0.25-0.5 mg by mouth daily as needed for anxiety.  (Patient not taking: Reported on 05/06/2021), Disp: , Rfl:    Aspirin-Acetaminophen-Caffeine (GOODY HEADACHE PO), Take 1 packet by mouth daily as needed (for pain/headache). (Patient not taking: Reported on 05/06/2021), Disp: , Rfl:    benzonatate (TESSALON) 100 MG capsule, Take 1 capsule (100 mg total) by mouth every 8 (eight) hours., Disp: 21 capsule, Rfl: 0   dexlansoprazole (DEXILANT) 60 MG capsule, Take 1 capsule (60 mg total) by mouth daily., Disp: 31 capsule, Rfl: 2   famotidine (PEPCID) 20 MG tablet, Take 20 mg by mouth at bedtime., Disp: , Rfl:    FLUoxetine (PROZAC) 40 MG capsule, Take 40 mg by mouth daily., Disp: , Rfl:    HYDROcodone-acetaminophen (NORCO/VICODIN) 5-325 MG tablet, Take 1 tablet by mouth every 6 (six) hours as needed. (Patient not taking: Reported on 05/06/2021), Disp: 20 tablet, Rfl: 0   loratadine (CLARITIN) 10 MG tablet, Take 10 mg by mouth daily., Disp: , Rfl:    losartan (COZAAR) 25 MG tablet, Take 25 mg by mouth daily., Disp: , Rfl:    metFORMIN (GLUCOPHAGE) 500 MG tablet, Take by mouth 2 (two) times daily with a meal., Disp: , Rfl:    Multiple Vitamins-Minerals (ZINC PO), Take 1 tablet by mouth daily., Disp: , Rfl:    pantoprazole (PROTONIX) 40 MG tablet, Take 1 tablet (40 mg total) by mouth daily., Disp: 30 tablet, Rfl: 0   predniSONE (STERAPRED UNI-PAK 21 TAB) 10 MG (21) TBPK tablet, Take by mouth daily. Take 6 tabs by mouth daily  for 2 days, then 5 tabs for 2 days, then 4 tabs for 2 days, then 3 tabs for 2 days, 2 tabs for 2 days, then 1 tab by mouth daily for 2 days, Disp: 42 tablet, Rfl: 0   traZODone (DESYREL) 100 MG tablet, Take 100 mg by mouth at bedtime., Disp: , Rfl:    vitamin C (ASCORBIC ACID) 500 MG tablet, Take 500 mg by mouth daily. , Disp:  , Rfl:   Observations/Objective: Patient is well-developed, well-nourished in no acute distress.  Resting comfortably at home.  Head is normocephalic, atraumatic.  No labored breathing.  Speech is clear and coherent with logical content.  Patient is alert and oriented at baseline.    Assessment and Plan: 1. Viral bronchitis - predniSONE (STERAPRED UNI-PAK 21 TAB) 10 MG (21) TBPK tablet; Take by mouth daily. Take 6 tabs by mouth daily  for 2 days, then 5 tabs for 2 days, then 4 tabs for 2 days, then 3 tabs for 2 days, 2 tabs for 2 days, then 1 tab by mouth daily for 2 days  Dispense: 42 tablet; Refill: 0 - promethazine-dextromethorphan (PROMETHAZINE-DM) 6.25-15 MG/5ML syrup; Take 5 mLs by mouth 4 (four) times daily as  needed for cough.  Dispense: 240 mL; Refill: 0   Follow Up Instructions: I discussed the assessment and treatment plan with the patient. The patient was provided an opportunity to ask questions and all were answered. The patient agreed with the plan and demonstrated an understanding of the instructions.  A copy of instructions were sent to the patient via MyChart unless otherwise noted below.   The patient was advised to call back or seek an in-person evaluation if the symptoms worsen or if the condition fails to improve as anticipated.  Time:  I spent 10 minutes with the patient via telehealth technology discussing the above problems/concerns.    Claiborne Rigg, NP

## 2021-06-28 ENCOUNTER — Ambulatory Visit (HOSPITAL_COMMUNITY)
Admission: RE | Admit: 2021-06-28 | Discharge: 2021-06-28 | Disposition: A | Discharge status: Home / Self Care | Payer: Medicaid Other | Source: Ambulatory Visit | Attending: Urgent Care | Admitting: Urgent Care

## 2021-06-28 ENCOUNTER — Other Ambulatory Visit: Payer: Self-pay

## 2021-06-28 ENCOUNTER — Ambulatory Visit
Admission: EM | Admit: 2021-06-28 | Discharge: 2021-06-28 | Disposition: A | Discharge status: Home / Self Care | Payer: Medicaid Other | Attending: Urgent Care | Admitting: Urgent Care

## 2021-06-28 ENCOUNTER — Ambulatory Visit: Payer: Self-pay

## 2021-06-28 ENCOUNTER — Telehealth: Payer: Self-pay | Admitting: Urgent Care

## 2021-06-28 DIAGNOSIS — R053 Chronic cough: Secondary | ICD-10-CM

## 2021-06-28 DIAGNOSIS — R042 Hemoptysis: Secondary | ICD-10-CM

## 2021-06-28 DIAGNOSIS — J9801 Acute bronchospasm: Secondary | ICD-10-CM | POA: Insufficient documentation

## 2021-06-28 DIAGNOSIS — R0602 Shortness of breath: Secondary | ICD-10-CM | POA: Insufficient documentation

## 2021-06-28 DIAGNOSIS — R059 Cough, unspecified: Secondary | ICD-10-CM | POA: Insufficient documentation

## 2021-06-28 MED ORDER — LEVOCETIRIZINE DIHYDROCHLORIDE 5 MG PO TABS: 5.0000 mg | ORAL_TABLET | Freq: Every evening | ORAL | 0 refills | Status: DC

## 2021-06-28 MED ORDER — PREDNISONE 50 MG PO TABS: 50.0000 mg | ORAL_TABLET | Freq: Every day | ORAL | 0 refills | Status: DC

## 2021-06-28 MED ORDER — ALBUTEROL SULFATE HFA 108 (90 BASE) MCG/ACT IN AERS: 1.0000 | INHALATION_SPRAY | Freq: Four times a day (QID) | RESPIRATORY_TRACT | 0 refills | Status: DC | PRN

## 2021-06-28 MED ORDER — PROMETHAZINE-DM 6.25-15 MG/5ML PO SYRP: 5.0000 mL | ORAL_SOLUTION | Freq: Three times a day (TID) | ORAL | 0 refills | Status: DC | PRN

## 2021-06-28 NOTE — ED Triage Notes (Signed)
Pt reports cough and  hoarse x 6 weeks; and ear pain on and off x 2 days. Steroids and cough meds gives no relief.

## 2021-06-28 NOTE — Telephone Encounter (Signed)
Chest x-ray completely negative.  We will use 1 more round of steroid and emphasized need to schedule albuterol and also take Xyzal daily.

## 2021-06-28 NOTE — ED Provider Notes (Signed)
[facility]   MRN: [id number] DOB: 1965-08-12  Subjective:   Candice Stanley is a 55 y.o. female presenting for 6 week history of persistent hacking cough. Symptoms worse with breathing cold air. No difficulties at night with her cough. Has tried cough syrup. Has already undergone a steroid course but has not been helpful to her. Has difficulty with a cough like this yearly. No history of asthma. No history of pulmonary embolism. She is currently undergoing work up with hematology but does not believe it is necessarily related to this. Has been tried on medication for acid reflux.  Currently takes 2 PPIs and famotidine.  She is not a smoker.  No current facility-administered medications for this encounter.  Current Outpatient Medications:    ALPRAZolam (XANAX) 0.5 MG tablet, Take 0.25-0.5 mg by mouth daily as needed for anxiety.  (Patient not taking: Reported on 05/06/2021), Disp: , Rfl:    Aspirin-Acetaminophen-Caffeine (GOODY HEADACHE PO), Take 1 packet by mouth daily as needed (for pain/headache). (Patient not taking: Reported on 05/06/2021), Disp: , Rfl:    benzonatate (TESSALON) 100 MG capsule, Take 1 capsule (100 mg total) by mouth every 8 (eight) hours., Disp: 21 capsule, Rfl: 0   dexlansoprazole (DEXILANT) 60 MG capsule, Take 1 capsule (60 mg total) by mouth daily., Disp: 31 capsule, Rfl: 2   famotidine (PEPCID) 20 MG tablet, Take 20 mg by mouth at bedtime., Disp: , Rfl:    FLUoxetine (PROZAC) 40 MG capsule, Take 40 mg by mouth daily., Disp: , Rfl:    HYDROcodone-acetaminophen (NORCO/VICODIN) 5-325 MG tablet, Take 1 tablet by mouth every 6 (six) hours as needed. (Patient not taking: Reported on 05/06/2021), Disp: 20 tablet, Rfl: 0   loratadine (CLARITIN) 10 MG tablet, Take 10 mg by mouth daily., Disp: , Rfl:    losartan (COZAAR) 25 MG tablet, Take 25 mg by mouth daily., Disp: , Rfl:    metFORMIN (GLUCOPHAGE) 500 MG tablet, Take by mouth 2 (two) times daily with a meal.,  Disp: , Rfl:    Multiple Vitamins-Minerals (ZINC PO), Take 1 tablet by mouth daily., Disp: , Rfl:    pantoprazole (PROTONIX) 40 MG tablet, Take 1 tablet (40 mg total) by mouth daily., Disp: 30 tablet, Rfl: 0   predniSONE (STERAPRED UNI-PAK 21 TAB) 10 MG (21) TBPK tablet, Take by mouth daily. Take 6 tabs by mouth daily  for 2 days, then 5 tabs for 2 days, then 4 tabs for 2 days, then 3 tabs for 2 days, 2 tabs for 2 days, then 1 tab by mouth daily for 2 days, Disp: 42 tablet, Rfl: 0   promethazine-dextromethorphan (PROMETHAZINE-DM) 6.25-15 MG/5ML syrup, Take 5 mLs by mouth 4 (four) times daily as needed for cough., Disp: 240 mL, Rfl: 0   traZODone (DESYREL) 100 MG tablet, Take 100 mg by mouth at bedtime., Disp: , Rfl:    vitamin C (ASCORBIC ACID) 500 MG tablet, Take 500 mg by mouth daily. , Disp: , Rfl:    Allergies  Allergen Reactions   Codeine Nausea And Vomiting   Dilaudid [Hydromorphone Hcl]     Drop in blood pressure   Tape Swelling and Other (See Comments)    Rash(paper tape)   Vicodin [Hydrocodone-Acetaminophen] Nausea And Vomiting   Latex Rash    Past Medical History:  Diagnosis Date   Anxiety    Depression    GERD (gastroesophageal reflux disease)    Hypercholesteremia    Hypertension    Hypothyroidism    Trimalleolar fracture  of ankle, closed, right, sequela    Type 2 diabetes mellitus (HCC)      Past Surgical History:  Procedure Laterality Date   ABDOMINAL HYSTERECTOMY     CARDIAC CATHETERIZATION N/A 09/10/2015   Procedure: Left Heart Cath and Coronary Angiography;  Surgeon: Corky Crafts, MD;  Location: [facility] INVASIVE CV LAB;  Service: Cardiovascular;  Laterality: N/A;   CHOLECYSTECTOMY     OPEN REDUCTION INTERNAL FIXATION (ORIF) TIBIA/FIBULA FRACTURE Right 08/04/2016   Procedure: OPEN REDUCTION INTERNAL FIXATION (ORIF) RIGHT TRIMALLEOLAR FRACTURE;  Surgeon: Toni Arthurs, MD;  Location: [facility];  Service: Orthopedics;  Laterality: Right;   WRIST  SURGERY      Family History  Problem Relation Age of Onset   Coronary artery disease Father    Heart disease Father    Barrett's esophagus Father    Hypertension Mother    Coronary artery disease Paternal Uncle    Coronary artery disease Cousin     Social History   Tobacco Use   Smoking status: Former    Types: Cigarettes    Quit date: 05/20/1991    Years since quitting: 30.1   Smokeless tobacco: Never   Tobacco comments:    Used to smoke a pack in 1-2 weeks/ 1992  Vaping Use   Vaping Use: Never used  Substance Use Topics   Alcohol use: Not Currently    Alcohol/week: 0.0 standard drinks    Comment: social   Drug use: No    ROS   Objective:   Vitals: BP (!) 151/78 (BP Location: Right Arm)    Pulse 98    Temp 98.7 F (37.1 C) (Oral)    Resp (!) 22    SpO2 96%   Physical Exam Constitutional:      General: She is not in acute distress.    Appearance: Normal appearance. She is well-developed. She is not ill-appearing, toxic-appearing or diaphoretic.  HENT:     Head: Normocephalic and atraumatic.     Nose: Nose normal.     Mouth/Throat:     Mouth: Mucous membranes are moist.  Eyes:     Extraocular Movements: Extraocular movements intact.     Pupils: Pupils are equal, round, and reactive to light.  Cardiovascular:     Rate and Rhythm: Normal rate and regular rhythm.     Pulses: Normal pulses.     Heart sounds: Normal heart sounds. No murmur heard.   No friction rub. No gallop.  Pulmonary:     Effort: Pulmonary effort is normal. No respiratory distress.     Breath sounds: Normal breath sounds. No stridor. No wheezing, rhonchi or rales.  Skin:    General: Skin is warm and dry.     Findings: No rash.  Neurological:     Mental Status: She is alert and oriented to person, place, and time.  Psychiatric:        Mood and Affect: Mood normal.        Behavior: Behavior normal.        Thought Content: Thought content normal.    Assessment and Plan :   PDMP not  reviewed this encounter.  1. Bronchospasm   2. Coughing up blood   3. Persistent cough    Recommended starting albuterol inhaler and will consider more steroids once we see her radiology overread.  For supportive care otherwise.  Will defer respiratory panel testing given timeline of her cough which I suspect is just more chronic in nature.  Counseled  on possibility of her having bronchospasms and she is agreeable to using the albuterol.  Follow-up with PCP otherwise.  Radiology overread pending. Counseled patient on potential for adverse effects with medications prescribed/recommended today, ER and return-to-clinic precautions discussed, patient verbalized understanding.    Wallis Bamberg, PA-C 06/28/21 [id number]

## 2021-07-08 ENCOUNTER — Ambulatory Visit
Admission: EM | Admit: 2021-07-08 | Discharge: 2021-07-08 | Disposition: A | Discharge status: Home / Self Care | Payer: Self-pay | Attending: Emergency Medicine | Admitting: Emergency Medicine

## 2021-07-08 ENCOUNTER — Encounter (HOSPITAL_COMMUNITY): Payer: Self-pay

## 2021-07-08 ENCOUNTER — Emergency Department (HOSPITAL_COMMUNITY): Payer: Self-pay

## 2021-07-08 ENCOUNTER — Other Ambulatory Visit: Payer: Self-pay

## 2021-07-08 ENCOUNTER — Emergency Department (HOSPITAL_COMMUNITY)
Admission: EM | Admit: 2021-07-08 | Discharge: 2021-07-08 | Disposition: A | Discharge status: Home / Self Care | Payer: Self-pay | Attending: Emergency Medicine | Admitting: Emergency Medicine

## 2021-07-08 ENCOUNTER — Encounter: Payer: Self-pay | Admitting: Emergency Medicine

## 2021-07-08 DIAGNOSIS — R042 Hemoptysis: Secondary | ICD-10-CM

## 2021-07-08 DIAGNOSIS — Z87891 Personal history of nicotine dependence: Secondary | ICD-10-CM | POA: Insufficient documentation

## 2021-07-08 DIAGNOSIS — E039 Hypothyroidism, unspecified: Secondary | ICD-10-CM | POA: Insufficient documentation

## 2021-07-08 DIAGNOSIS — I10 Essential (primary) hypertension: Secondary | ICD-10-CM | POA: Insufficient documentation

## 2021-07-08 DIAGNOSIS — J189 Pneumonia, unspecified organism: Secondary | ICD-10-CM | POA: Insufficient documentation

## 2021-07-08 DIAGNOSIS — Z79899 Other long term (current) drug therapy: Secondary | ICD-10-CM | POA: Insufficient documentation

## 2021-07-08 DIAGNOSIS — R0789 Other chest pain: Secondary | ICD-10-CM | POA: Insufficient documentation

## 2021-07-08 DIAGNOSIS — R052 Subacute cough: Secondary | ICD-10-CM

## 2021-07-08 DIAGNOSIS — E119 Type 2 diabetes mellitus without complications: Secondary | ICD-10-CM | POA: Insufficient documentation

## 2021-07-08 LAB — CBC WITH DIFFERENTIAL/PLATELET
Abs Immature Granulocytes: 0 10*3/uL (ref 0.00–0.07)
Band Neutrophils: 0 %
Basophils Absolute: 0 10*3/uL (ref 0.0–0.1)
Basophils Relative: 1 %
Blasts: 0 %
Eosinophils Absolute: 0 10*3/uL (ref 0.0–0.5)
Eosinophils Relative: 1 %
HCT: 36.2 % (ref 36.0–46.0)
Hemoglobin: 11.8 g/dL — ABNORMAL LOW (ref 12.0–15.0)
Lymphocytes Relative: 22 %
Lymphs Abs: 1 10*3/uL (ref 0.7–4.0)
MCH: 31.8 pg (ref 26.0–34.0)
MCHC: 32.6 g/dL (ref 30.0–36.0)
MCV: 97.6 fL (ref 80.0–100.0)
Metamyelocytes Relative: 0 %
Monocytes Absolute: 0.4 10*3/uL (ref 0.1–1.0)
Monocytes Relative: 10 %
Myelocytes: 0 %
Neutro Abs: 2.9 10*3/uL (ref 1.7–7.7)
Neutrophils Relative %: 66 %
Other: 0 %
Platelets: 60 10*3/uL — ABNORMAL LOW (ref 150–400)
Promyelocytes Relative: 0 %
RBC: 3.71 MIL/uL — ABNORMAL LOW (ref 3.87–5.11)
RDW: 15 % (ref 11.5–15.5)
Smear Review: DECREASED
WBC: 4.4 10*3/uL (ref 4.0–10.5)
nRBC: 0 % (ref 0.0–0.2)
nRBC: 0 /100 WBC

## 2021-07-08 LAB — COMPREHENSIVE METABOLIC PANEL
ALT: 22 U/L (ref 0–44)
AST: 26 U/L (ref 15–41)
Albumin: 3.1 g/dL — ABNORMAL LOW (ref 3.5–5.0)
Alkaline Phosphatase: 116 U/L (ref 38–126)
Anion gap: 6 (ref 5–15)
BUN: 8 mg/dL (ref 6–20)
CO2: 25 mmol/L (ref 22–32)
Calcium: 8.9 mg/dL (ref 8.9–10.3)
Chloride: 105 mmol/L (ref 98–111)
Creatinine, Ser: 0.51 mg/dL (ref 0.44–1.00)
GFR, Estimated: >60 mL/min (ref >60)
Glucose, Bld: 283 mg/dL — ABNORMAL HIGH (ref 70–99)
Potassium: 3.8 mmol/L (ref 3.5–5.1)
Sodium: 136 mmol/L (ref 135–145)
Total Bilirubin: 2.1 mg/dL — ABNORMAL HIGH (ref 0.3–1.2)
Total Protein: 6.1 g/dL — ABNORMAL LOW (ref 6.5–8.1)

## 2021-07-08 LAB — D-DIMER, QUANTITATIVE: D-Dimer, Quant: 3.18 ug/mL-FEU — ABNORMAL HIGH (ref 0.00–0.50)

## 2021-07-08 MED ORDER — AZITHROMYCIN 250 MG PO TABS: 250.0000 mg | ORAL_TABLET | Freq: Every day | ORAL | 0 refills | Status: DC

## 2021-07-08 MED ORDER — FLUTICASONE PROPIONATE HFA 110 MCG/ACT IN AERO: 2.0000 | INHALATION_SPRAY | Freq: Two times a day (BID) | RESPIRATORY_TRACT | 12 refills | Status: DC

## 2021-07-08 MED ORDER — IOHEXOL 350 MG/ML SOLN
100.0000 mL | Freq: Once | INTRAVENOUS | Status: AC | PRN
  Administered 2021-07-08: 21:00:00 100 mL via INTRAVENOUS

## 2021-07-08 MED ORDER — AMOXICILLIN-POT CLAVULANATE 875-125 MG PO TABS
1.0000 | ORAL_TABLET | Freq: Once | ORAL | Status: AC
  Administered 2021-07-08: 23:00:00 1 via ORAL
  Filled 2021-07-08: qty 1

## 2021-07-08 MED ORDER — ALBUTEROL SULFATE HFA 108 (90 BASE) MCG/ACT IN AERS
2.0000 | INHALATION_SPRAY | RESPIRATORY_TRACT | Status: DC | PRN
  Filled 2021-07-08 (×2): qty 6.7

## 2021-07-08 MED ORDER — AMOXICILLIN-POT CLAVULANATE 875-125 MG PO TABS: 1.0000 | ORAL_TABLET | Freq: Two times a day (BID) | ORAL | 0 refills | Status: DC

## 2021-07-08 NOTE — ED Provider Notes (Signed)
HPI  SUBJECTIVE:  Candice Stanley is a 55 y.o. female who presents with a persistent, nonproductive intractable cough since October.  She reports hemoptysis.  She states that her throat is sore and raw, and that her body hurts because of the cough.  She denies pleuritic chest pain.  Reports shortness of breath after coughing only.  No fevers, dyspnea on exertion, calf pain or swelling, surgery in the past 4 weeks, recent immobilization, exogenous estrogen, recent COVID infection.  She denies nasal congestion, sinus pain or pressure, postnasal drip.  Patient had an ED visit on 11/26, diagnosed with a viral bronchitis.  She was prescribed prednisone and Promethazine DM.  Returned here on 12/19, chest x-ray was negative, thought to have bronchospasm, and was started on albuterol,, Xyzal.  Patient has tried all of these things and nothing has worked.  She states that the cough is changed recently.  She states it feels that it is further up into her chest.  No real alleviating factors.  Symptoms worse with exposure to cold air.  She has a past medical history of diabetes, hypertension, pancytopenia, GERD, but states that it is not bothering her.  She is compliant with her medications.  No history of pulmonary disease, smoking, PE, DVT, hypercoagulability, COVID, cancer.  PMD: Celene Squibb, MD   Past Medical History:  Diagnosis Date   Anxiety    Depression    GERD (gastroesophageal reflux disease)    Hypercholesteremia    Hypertension    Hypothyroidism    Trimalleolar fracture of ankle, closed, right, sequela    Type 2 diabetes mellitus ([facility])     Past Surgical History:  Procedure Laterality Date   ABDOMINAL HYSTERECTOMY     CARDIAC CATHETERIZATION N/A 09/10/2015   Procedure: Left Heart Cath and Coronary Angiography;  Surgeon: Jettie Booze, MD;  Location: [facility];  Service: Cardiovascular;  Laterality: N/A;   CHOLECYSTECTOMY     OPEN REDUCTION INTERNAL FIXATION (ORIF) TIBIA/FIBULA  FRACTURE Right 08/04/2016   Procedure: OPEN REDUCTION INTERNAL FIXATION (ORIF) RIGHT TRIMALLEOLAR FRACTURE;  Surgeon: Wylene Simmer, MD;  Location: [facility];  Service: Orthopedics;  Laterality: Right;   WRIST SURGERY      Family History  Problem Relation Age of Onset   Coronary artery disease Father    Heart disease Father    Barrett's esophagus Father    Hypertension Mother    Coronary artery disease Paternal Uncle    Coronary artery disease Cousin     Social History   Tobacco Use   Smoking status: Former    Types: Cigarettes    Quit date: 05/20/1991    Years since quitting: 30.1   Smokeless tobacco: Never   Tobacco comments:    Used to smoke a pack in 1-2 weeks/ 1992  Vaping Use   Vaping Use: Never used  Substance Use Topics   Alcohol use: Not Currently    Alcohol/week: 0.0 standard drinks    Comment: social   Drug use: No    No current facility-administered medications for this encounter.  Current Outpatient Medications:    albuterol (VENTOLIN HFA) 108 (90 Base) MCG/ACT inhaler, Inhale 1-2 puffs into the lungs every 6 (six) hours as needed for wheezing or shortness of breath., Disp: 18 g, Rfl: 0   ALPRAZolam (XANAX) 0.5 MG tablet, Take 0.25-0.5 mg by mouth daily as needed for anxiety.  (Patient not taking: Reported on 05/06/2021), Disp: , Rfl:    Aspirin-Acetaminophen-Caffeine (GOODY HEADACHE PO), Take  1 packet by mouth daily as needed (for pain/headache). (Patient not taking: Reported on 05/06/2021), Disp: , Rfl:    benzonatate (TESSALON) 100 MG capsule, Take 1 capsule (100 mg total) by mouth every 8 (eight) hours., Disp: 21 capsule, Rfl: 0   dexlansoprazole (DEXILANT) 60 MG capsule, Take 1 capsule (60 mg total) by mouth daily., Disp: 31 capsule, Rfl: 2   famotidine (PEPCID) 20 MG tablet, Take 20 mg by mouth at bedtime., Disp: , Rfl:    FLUoxetine (PROZAC) 40 MG capsule, Take 40 mg by mouth daily., Disp: , Rfl:    HYDROcodone-acetaminophen (NORCO/VICODIN)  5-325 MG tablet, Take 1 tablet by mouth every 6 (six) hours as needed. (Patient not taking: Reported on 05/06/2021), Disp: 20 tablet, Rfl: 0   levocetirizine (XYZAL) 5 MG tablet, Take 1 tablet (5 mg total) by mouth every evening., Disp: 90 tablet, Rfl: 0   loratadine (CLARITIN) 10 MG tablet, Take 10 mg by mouth daily., Disp: , Rfl:    losartan (COZAAR) 25 MG tablet, Take 25 mg by mouth daily., Disp: , Rfl:    metFORMIN (GLUCOPHAGE) 500 MG tablet, Take by mouth 2 (two) times daily with a meal., Disp: , Rfl:    Multiple Vitamins-Minerals (ZINC PO), Take 1 tablet by mouth daily., Disp: , Rfl:    pantoprazole (PROTONIX) 40 MG tablet, Take 1 tablet (40 mg total) by mouth daily., Disp: 30 tablet, Rfl: 0   predniSONE (DELTASONE) 50 MG tablet, Take 1 tablet (50 mg total) by mouth daily with breakfast., Disp: 5 tablet, Rfl: 0   promethazine-dextromethorphan (PROMETHAZINE-DM) 6.25-15 MG/5ML syrup, Take 5 mLs by mouth 3 (three) times daily as needed for cough., Disp: 100 mL, Rfl: 0   traZODone (DESYREL) 100 MG tablet, Take 100 mg by mouth at bedtime., Disp: , Rfl:    vitamin C (ASCORBIC ACID) 500 MG tablet, Take 500 mg by mouth daily. , Disp: , Rfl:   Allergies  Allergen Reactions   Codeine Nausea And Vomiting   Dilaudid [Hydromorphone Hcl]     Drop in blood pressure   Tape Swelling and Other (See Comments)    Rash(paper tape)   Vicodin [Hydrocodone-Acetaminophen] Nausea And Vomiting   Latex Rash     ROS  As noted in HPI.   Physical Exam  BP (!) 169/99 (BP Location: Right Arm)    Pulse (!) 103    Temp 99.1 F (37.3 C) (Oral)    Resp 20    SpO2 92%   Constitutional: Well developed, well nourished, no acute distress.  Coughing. Eyes:  EOMI, conjunctiva normal bilaterally HENT: Normocephalic, atraumatic,mucus membranes moist Respiratory: Normal inspiratory effort, lungs clear bilaterally, good air movement.  No anterior, lateral chest wall tenderness Cardiovascular: Normal rate, regular  tachycardia GI: nondistended skin: No rash, skin intact Musculoskeletal: Symmetric, nontender, no edema Neurologic: Alert & oriented x 3, no focal neuro deficits Psychiatric: Speech and behavior appropriate   ED Course   Medications - No data to display  No orders of the defined types were placed in this encounter.   No results found for this or any previous visit (from the past 24 hour(s)). No results found.  ED Clinical Impression  1. Subacute cough   2. Hemoptysis      ED Assessment/Plan  Previous records reviewed.  As noted in HPI.  Patient's oxygen saturation is trending down, she is tachycardic today.  Heart rate is consistently 103, with oxygen saturation ranging in between 91 and 92%.  She states that her cough  has recently changed.  She has had a normal chest x-ray, and has been treated with steroids, bronchodilators, allergy medication without any improvement in her symptoms.  She could have severe bronchospasm, acid reflux, however, in the differential is PE.  Transferring to the ED for further work-up.  She is stable to go by private vehicle.  Had a long discussion with patient about the medical decision making, rationale for transfer to the emergency department.  Patient agrees to go.    No orders of the defined types were placed in this encounter.     *This clinic note was created using Dragon dictation software. Therefore, there may be occasional mistakes despite careful proofreading.  ?    Melynda Ripple, MD 07/08/21 [phone]

## 2021-07-08 NOTE — ED Provider Notes (Signed)
[facility] EMERGENCY DEPARTMENT Provider Note   CSN: [id number] Arrival date & time: 07/08/21  1557     History Chief Complaint  Patient presents with   Cough    Candice Stanley is a 55 y.o. female.   Cough  This patient is a 55 year old female, she has a known history of diabetes, hypothyroidism, leukopenia, recurrent bronchitis and states that every single year for the last 5 years she has developed a period of several months where she has trouble coughing, and last for a couple months and gradually goes away.  She has been seen by multiple doctors initially but since that time has been so frustrated by the coughing and no improvement that she has not been recently.  She has been to urgent care and been prescribed dextromethorphan with promethazine, she has been to the urgent care and been given benzonatate and today when she went back to the urgent care after failing these medications as well as failing prednisone and albuterol at home she was sent to the emergency department for evaluation as she was complaining of having some blood in her sputum as well.  Of note the patient does take losartan for her blood pressure, she does not take an ACE inhibitor.  She has no significant swelling of her legs, no fevers or chills, no risk factors for pulmonary embolism or DVT.  In fact when she started coughing 5 years ago she had a DVT work-up at that time which was negative.  Past Medical History:  Diagnosis Date   Anxiety    Depression    GERD (gastroesophageal reflux disease)    Hypercholesteremia    Hypertension    Hypothyroidism    Trimalleolar fracture of ankle, closed, right, sequela    Type 2 diabetes mellitus (HCC)     Patient Active Problem List   Diagnosis Date Noted   Hypothyroidism 06/05/2018   Type 2 diabetes mellitus (HCC) 06/05/2018   Hypophosphatemia 06/05/2018   Depression with anxiety 06/05/2018   Thrombocytopenia (HCC) 06/05/2018   Leukopenia 06/05/2018    Hypokalemia 06/05/2018   Chronic cough 12/06/2016   Precordial pain    Family history of early CAD    Chest pain 09/02/2015   Dysphagia 04/16/2012   Odynophagia 04/16/2012   Unspecified hypothyroidism 11/03/2009   Hyperlipidemia 11/03/2009   OBESITY, UNSPECIFIED 11/03/2009   DYSTHYMIC DISORDER 11/03/2009   Esophageal reflux 11/03/2009   Diaphragmatic hernia 11/03/2009   CHEST PAIN UNSPECIFIED 11/03/2009    Past Surgical History:  Procedure Laterality Date   ABDOMINAL HYSTERECTOMY     CARDIAC CATHETERIZATION N/A 09/10/2015   Procedure: Left Heart Cath and Coronary Angiography;  Surgeon: Corky Crafts, MD;  Location: [facility] INVASIVE CV LAB;  Service: Cardiovascular;  Laterality: N/A;   CHOLECYSTECTOMY     OPEN REDUCTION INTERNAL FIXATION (ORIF) TIBIA/FIBULA FRACTURE Right 08/04/2016   Procedure: OPEN REDUCTION INTERNAL FIXATION (ORIF) RIGHT TRIMALLEOLAR FRACTURE;  Surgeon: Toni Arthurs, MD;  Location: [facility];  Service: Orthopedics;  Laterality: Right;   WRIST SURGERY       OB History   No obstetric history on file.     Family History  Problem Relation Age of Onset   Coronary artery disease Father    Heart disease Father    Barrett's esophagus Father    Hypertension Mother    Coronary artery disease Paternal Uncle    Coronary artery disease Cousin     Social History   Tobacco Use   Smoking status: Former  Types: Cigarettes    Quit date: 05/20/1991    Years since quitting: 30.1   Smokeless tobacco: Never   Tobacco comments:    Used to smoke a pack in 1-2 weeks/ 1992  Vaping Use   Vaping Use: Never used  Substance Use Topics   Alcohol use: Not Currently    Alcohol/week: 0.0 standard drinks    Comment: social   Drug use: No    Home Medications Prior to Admission medications   Medication Sig Start Date End Date Taking? Authorizing Provider  amoxicillin-clavulanate (AUGMENTIN) 875-125 MG tablet Take 1 tablet by mouth every 12 (twelve) hours.  07/08/21  Yes Eber Hong, MD  azithromycin (ZITHROMAX Z-PAK) 250 MG tablet Take 1 tablet (250 mg total) by mouth daily.  PO day 1, then  PO days 205 07/08/21  Yes Eber Hong, MD  fluticasone (FLOVENT HFA) 110 MCG/ACT inhaler Inhale 2 puffs into the lungs in the morning and at bedtime for 10 days. 07/08/21 07/18/21 Yes Eber Hong, MD  albuterol (VENTOLIN HFA) 108 (90 Base) MCG/ACT inhaler Inhale 1-2 puffs into the lungs every 6 (six) hours as needed for wheezing or shortness of breath. 06/28/21   Wallis Bamberg, PA-C  ALPRAZolam Prudy Feeler) 0.5 MG tablet Take 0.25-0.5 mg by mouth daily as needed for anxiety.  Patient not taking: Reported on 05/06/2021    [provider]  Aspirin-Acetaminophen-Caffeine (GOODY HEADACHE PO) Take 1 packet by mouth daily as needed (for pain/headache). Patient not taking: Reported on 05/06/2021    [provider]  benzonatate (TESSALON) 100 MG capsule Take 1 capsule (100 mg total) by mouth every 8 (eight) hours. 10/10/20   Wurst, [place], PA-C  dexlansoprazole (DEXILANT) 60 MG capsule Take 1 capsule (60 mg total) by mouth daily. 04/16/12   Joselyn Arrow, NP  famotidine (PEPCID) 20 MG tablet Take 20 mg by mouth at bedtime.    [provider]  FLUoxetine (PROZAC) 40 MG capsule Take 40 mg by mouth daily. 08/15/19   [provider]  HYDROcodone-acetaminophen (NORCO/VICODIN) 5-325 MG tablet Take 1 tablet by mouth every 6 (six) hours as needed. Patient not taking: Reported on 05/06/2021 07/11/20   Bethann Berkshire, MD  levocetirizine (XYZAL) 5 MG tablet Take 1 tablet (5 mg total) by mouth every evening. 06/28/21   Wallis Bamberg, PA-C  loratadine (CLARITIN) 10 MG tablet Take 10 mg by mouth daily.    [provider]  losartan (COZAAR) 25 MG tablet Take 25 mg by mouth daily. 04/03/21   [provider]  metFORMIN (GLUCOPHAGE) 500 MG tablet Take by mouth 2 (two) times daily with a meal.    [provider]  Multiple  Vitamins-Minerals (ZINC PO) Take 1 tablet by mouth daily.    [provider]  pantoprazole (PROTONIX) 40 MG tablet Take 1 tablet (40 mg total) by mouth daily. 05/12/21   Doreatha Massed, MD  predniSONE (DELTASONE) 50 MG tablet Take 1 tablet (50 mg total) by mouth daily with breakfast. 06/28/21   Wallis Bamberg, PA-C  promethazine-dextromethorphan (PROMETHAZINE-DM) 6.25-15 MG/5ML syrup Take 5 mLs by mouth 3 (three) times daily as needed for cough. 06/28/21   Wallis Bamberg, PA-C  traZODone (DESYREL) 100 MG tablet Take 100 mg by mouth at bedtime.    [provider]  vitamin C (ASCORBIC ACID) 500 MG tablet Take 500 mg by mouth daily.     [provider]    Allergies    Codeine, Dilaudid [hydromorphone hcl], Tape, Vicodin [hydrocodone-acetaminophen], and Latex  Review of  Systems   Review of Systems  Respiratory:  Positive for cough.   All other systems reviewed and are negative.  Physical Exam Updated Vital Signs BP (!) 119/56 (BP Location: Right Arm)    Pulse 92    Temp 98.5 F (36.9 C) (Oral)    Resp 18    Ht 1.626 m (5\' 4" )    Wt 113.4 kg    SpO2 99%    BMI 42.91 kg/m   Physical Exam Vitals and nursing note reviewed.  Constitutional:      General: She is not in acute distress.    Appearance: She is well-developed.  HENT:     Head: Normocephalic and atraumatic.     Mouth/Throat:     Pharynx: No oropharyngeal exudate.  Eyes:     General: No scleral icterus.       Right eye: No discharge.        Left eye: No discharge.     Conjunctiva/sclera: Conjunctivae normal.     Pupils: Pupils are equal, round, and reactive to light.  Neck:     Thyroid: No thyromegaly.     Vascular: No JVD.  Cardiovascular:     Rate and Rhythm: Normal rate and regular rhythm.     Heart sounds: Normal heart sounds. No murmur heard.   No friction rub. No gallop.  Pulmonary:     Effort: Pulmonary effort is normal. No respiratory distress.     Breath sounds: Normal breath sounds. No  wheezing or rales.  Abdominal:     General: Bowel sounds are normal. There is no distension.     Palpations: Abdomen is soft. There is no mass.     Tenderness: There is no abdominal tenderness.  Musculoskeletal:        General: No tenderness. Normal range of motion.     Cervical back: Normal range of motion and neck supple.  Lymphadenopathy:     Cervical: No cervical adenopathy.  Skin:    General: Skin is warm and dry.     Findings: No erythema or rash.  Neurological:     Mental Status: She is alert.     Coordination: Coordination normal.  Psychiatric:        Behavior: Behavior normal.    ED Results / Procedures / Treatments   Labs (all labs ordered are listed, but only abnormal results are displayed) Labs Reviewed  CBC WITH DIFFERENTIAL/PLATELET - Abnormal; Notable for the following components:      Result Value   RBC 3.71 (*)    Hemoglobin 11.8 (*)    Platelets 60 (*)    All other components within normal limits  COMPREHENSIVE METABOLIC PANEL - Abnormal; Notable for the following components:   Glucose, Bld 283 (*)    Total Protein 6.1 (*)    Albumin 3.1 (*)    Total Bilirubin 2.1 (*)    All other components within normal limits  D-DIMER, QUANTITATIVE - Abnormal; Notable for the following components:   D-Dimer, Quant 3.18 (*)    All other components within normal limits    EKG None  Radiology DG Chest 2 View  Result Date: 07/08/2021 CLINICAL DATA:  Shortness of breath, cough, and chest pain for 1 week EXAM: CHEST - 2 VIEW COMPARISON:  06/28/2021 FINDINGS: Shallow inspiration. Heart size and pulmonary vascularity are normal for technique. No airspace disease or consolidation in the lungs. No pleural effusions. No pneumothorax. Mediastinal contours appear intact. IMPRESSION: Shallow inspiration.  No evidence of active pulmonary disease.  Electronically Signed   By: Burman Nieves M.D.   On: 07/08/2021 17:10   CT Angio Chest PE W and/or Wo Contrast  Result Date:  07/08/2021 CLINICAL DATA:  Concern for pulmonary embolism. EXAM: CT ANGIOGRAPHY CHEST WITH CONTRAST TECHNIQUE: Multidetector CT imaging of the chest was performed using the standard protocol during bolus administration of intravenous contrast. Multiplanar CT image reconstructions and MIPs were obtained to evaluate the vascular anatomy. CONTRAST:  OMNIPAQUE IOHEXOL 350 MG/ML SOLN COMPARISON:  Chest CT dated 07/11/2020. FINDINGS: Evaluation of this exam is limited due to respiratory motion artifact. Cardiovascular: Borderline cardiomegaly. No pericardial effusion. The thoracic aorta is unremarkable. The origins of the great vessels of the aortic arch appear patent as visualized. Evaluation of the pulmonary arteries is limited due to respiratory motion artifact. No obvious large or central pulmonary artery emboli identified. Mediastinum/Nodes: No hilar or mediastinal adenopathy. The esophagus is grossly unremarkable. No mediastinal fluid collection. Lungs/Pleura: Apparent clusters of ground-glass density involving the left infrahilar region and left lower lobe most consistent with pneumonia. Clinical correlation and follow-up to resolution recommended. Additional smaller clusters of ground-glass density noted in the right upper lobe. No lobar consolidation, pleural effusion, pneumothorax. The central airways are patent. Upper Abdomen: Fatty liver with changes of cirrhosis. Small ascites and splenomegaly measuring 17 cm in length. Indeterminate 2 cm left adrenal nodule. Cholecystectomy. Upper abdominal varices. Musculoskeletal: Degenerative changes of the spine. No acute osseous pathology. Review of the MIP images confirms the above findings. IMPRESSION: 1. No CT evidence of central pulmonary artery emboli. 2. Apparent clusters of ground-glass density involving the left infrahilar region and left lower lobe most consistent with pneumonia. Clinical correlation and follow-up to resolution recommended. 3. Cirrhosis  with evidence of portal hypertension, small ascites, and splenomegaly. 4. Indeterminate 2 cm left adrenal nodule. Electronically Signed   By: Elgie Collard M.D.   On: 07/08/2021 21:44    Procedures Procedures   Medications Ordered in ED Medications  albuterol (VENTOLIN HFA) 108 (90 Base) MCG/ACT inhaler 2 puff (has no administration in time range)  amoxicillin-clavulanate (AUGMENTIN) 875-125 MG per tablet 1 tablet (has no administration in time range)  iohexol (OMNIPAQUE) 350 MG/ML injection 100 mL (100 mLs Intravenous Contrast Given 07/08/21 2059)    ED Course  I have reviewed the triage vital signs and the nursing notes.  Pertinent labs & imaging results that were available during my care of the patient were reviewed by me and considered in my medical decision making (see chart for details).    MDM Rules/Calculators/A&P                          The patient's lab work-up has been initiated to make sure that her D-dimer is normal.  I think she is low risk for DVT or PE but she was sent over for that evaluation.  Her x-ray has been performed I personally viewed this and interpreted it, there is no signs of acute infiltrates or pneumothorax.    Will talk with the patient regarding the need to have decreased air movement across her vocal cords and through her bronchus to help reduce her cough, she states that she talks constantly all day long.  Will also give albuterol nebulizer for home as well as a steroid inhaler.  She does not want oral steroids as they have "tore up my stomach" in the past   CT scan does not reveal any signs of pulmonary embolism, it does  however reveal that there is likely pneumonia, the patient will be treated with Augmentin and Zithromax, it shows that she does have some possible cirrhosis, she was given a copy of her CT scan report and expressed her understanding to the indications for close follow-up.  She is very stable for discharge     Final Clinical  Impression(s) / ED Diagnoses Final diagnoses:  Community acquired pneumonia, unspecified laterality    Rx / DC Orders ED Discharge Orders          Ordered    amoxicillin-clavulanate (AUGMENTIN) 875-125 MG tablet  Every 12 hours        07/08/21 2226    azithromycin (ZITHROMAX Z-PAK) 250 MG tablet  Daily        07/08/21 2226    fluticasone (FLOVENT HFA) 110 MCG/ACT inhaler  2 times daily        07/08/21 2227             Eber Hong, MD 07/08/21 2227

## 2021-07-08 NOTE — Discharge Instructions (Addendum)
I have looked at your CT scan and it appears that you do have some pneumonia in your lung.  It also appears that you have some chronic liver disease called cirrhosis, you will need to follow-up with your family doctor regarding this finding so that they can do more test to make sure they know exactly what is going on.  At this time I think it is important to add antibiotics to help reduce the coughing that you are having.  I have prescribed Augmentin that you should take twice a day for 7 days and Zithromax for 5 days as well as a Flovent inhaler which may help with some of the coughing.  You may continue to use the albuterol inhaler  Again I would recommend that you minimize the amount of talking that you are doing as this can cause the cough to continue  ER for worsening symptoms

## 2021-07-08 NOTE — ED Notes (Signed)
Patient is being discharged from the Urgent Care and sent to the Emergency Department via private vehicle . Per Dr. Bridgette Habermann, patient is in need of higher level of care due to low O2 sat and high heart rate. Patient is aware and verbalizes understanding of plan of care.  Vitals:   07/08/21 1358  BP: (!) 169/99  Pulse: (!) 103  Resp: 20  Temp: 99.1 F (37.3 C)  SpO2: 92%

## 2021-07-08 NOTE — Discharge Instructions (Addendum)
Concerns with your heart rates and lower oxygen level, and the fact that your cough has changed, that this could be a blood clot in your lung.  It could also be severe bronchospasm that may need more than a few puffs of albuterol.  Please go to the emergency department.  Let them know if anything changes

## 2021-07-08 NOTE — ED Triage Notes (Signed)
Reports productive cough that is different all the time.  Reports being sick x 6-8weeks denies fevers

## 2021-07-08 NOTE — ED Triage Notes (Signed)
Has had several visits for same symptoms.  Cough x several weeks, voice comes and goes.  States ears, back, and stomach are hurt when coughing.  Had chest x-ray done recently 12/19 and states it was clear.

## 2021-09-09 ENCOUNTER — Other Ambulatory Visit: Payer: Self-pay

## 2021-09-09 ENCOUNTER — Inpatient Hospital Stay (HOSPITAL_COMMUNITY): Payer: Self-pay | Attending: Hematology

## 2021-09-09 DIAGNOSIS — D61818 Other pancytopenia: Secondary | ICD-10-CM

## 2021-09-09 DIAGNOSIS — D72819 Decreased white blood cell count, unspecified: Secondary | ICD-10-CM | POA: Insufficient documentation

## 2021-09-09 DIAGNOSIS — D696 Thrombocytopenia, unspecified: Secondary | ICD-10-CM | POA: Insufficient documentation

## 2021-09-09 LAB — CBC WITH DIFFERENTIAL/PLATELET
Abs Immature Granulocytes: 0 10*3/uL (ref 0.00–0.07)
Basophils Absolute: 0 10*3/uL (ref 0.0–0.1)
Basophils Relative: 1 %
Eosinophils Absolute: 0 10*3/uL (ref 0.0–0.5)
Eosinophils Relative: 1 %
HCT: 34.4 % — ABNORMAL LOW (ref 36.0–46.0)
Hemoglobin: 10.9 g/dL — ABNORMAL LOW (ref 12.0–15.0)
Immature Granulocytes: 0 %
Lymphocytes Relative: 43 %
Lymphs Abs: 1.1 10*3/uL (ref 0.7–4.0)
MCH: 30.4 pg (ref 26.0–34.0)
MCHC: 31.7 g/dL (ref 30.0–36.0)
MCV: 95.8 fL (ref 80.0–100.0)
Monocytes Absolute: 0.2 10*3/uL (ref 0.1–1.0)
Monocytes Relative: 7 %
Neutro Abs: 1.2 10*3/uL — ABNORMAL LOW (ref 1.7–7.7)
Neutrophils Relative %: 48 %
Platelets: 54 10*3/uL — ABNORMAL LOW (ref 150–400)
RBC: 3.59 MIL/uL — ABNORMAL LOW (ref 3.87–5.11)
RDW: 15.8 % — ABNORMAL HIGH (ref 11.5–15.5)
WBC: 2.6 10*3/uL — ABNORMAL LOW (ref 4.0–10.5)
nRBC: 0 % (ref 0.0–0.2)

## 2021-09-14 NOTE — Progress Notes (Signed)
Has follow up 09/16/21

## 2021-09-16 ENCOUNTER — Inpatient Hospital Stay (HOSPITAL_COMMUNITY): Payer: Self-pay | Admitting: Hematology

## 2021-09-17 ENCOUNTER — Other Ambulatory Visit: Payer: Self-pay

## 2021-09-17 ENCOUNTER — Inpatient Hospital Stay (HOSPITAL_COMMUNITY)
Admission: EM | Admit: 2021-09-17 | Discharge: 2021-09-25 | DRG: 871 | Disposition: A | Discharge status: Home / Self Care | Payer: Self-pay | Attending: Internal Medicine | Admitting: Internal Medicine

## 2021-09-17 ENCOUNTER — Ambulatory Visit: Payer: Self-pay

## 2021-09-17 ENCOUNTER — Encounter (HOSPITAL_COMMUNITY): Payer: Self-pay | Admitting: *Deleted

## 2021-09-17 ENCOUNTER — Ambulatory Visit
Admission: EM | Admit: 2021-09-17 | Discharge: 2021-09-17 | Disposition: A | Discharge status: Home / Self Care | Payer: Self-pay | Attending: Family Medicine | Admitting: Family Medicine

## 2021-09-17 ENCOUNTER — Ambulatory Visit (INDEPENDENT_AMBULATORY_CARE_PROVIDER_SITE_OTHER): Payer: Self-pay

## 2021-09-17 DIAGNOSIS — F419 Anxiety disorder, unspecified: Secondary | ICD-10-CM | POA: Diagnosis present

## 2021-09-17 DIAGNOSIS — E039 Hypothyroidism, unspecified: Secondary | ICD-10-CM | POA: Diagnosis present

## 2021-09-17 DIAGNOSIS — J189 Pneumonia, unspecified organism: Principal | ICD-10-CM

## 2021-09-17 DIAGNOSIS — A419 Sepsis, unspecified organism: Principal | ICD-10-CM | POA: Diagnosis present

## 2021-09-17 DIAGNOSIS — Z6841 Body Mass Index (BMI) 40.0 and over, adult: Secondary | ICD-10-CM

## 2021-09-17 DIAGNOSIS — K766 Portal hypertension: Secondary | ICD-10-CM

## 2021-09-17 DIAGNOSIS — E7800 Pure hypercholesterolemia, unspecified: Secondary | ICD-10-CM | POA: Diagnosis present

## 2021-09-17 DIAGNOSIS — Z20822 Contact with and (suspected) exposure to covid-19: Secondary | ICD-10-CM | POA: Diagnosis present

## 2021-09-17 DIAGNOSIS — D61818 Other pancytopenia: Secondary | ICD-10-CM | POA: Diagnosis present

## 2021-09-17 DIAGNOSIS — R1310 Dysphagia, unspecified: Secondary | ICD-10-CM

## 2021-09-17 DIAGNOSIS — K2101 Gastro-esophageal reflux disease with esophagitis, with bleeding: Secondary | ICD-10-CM | POA: Diagnosis present

## 2021-09-17 DIAGNOSIS — D696 Thrombocytopenia, unspecified: Secondary | ICD-10-CM

## 2021-09-17 DIAGNOSIS — R0602 Shortness of breath: Secondary | ICD-10-CM

## 2021-09-17 DIAGNOSIS — I110 Hypertensive heart disease with heart failure: Secondary | ICD-10-CM | POA: Diagnosis present

## 2021-09-17 DIAGNOSIS — R188 Other ascites: Secondary | ICD-10-CM | POA: Diagnosis present

## 2021-09-17 DIAGNOSIS — K7460 Unspecified cirrhosis of liver: Secondary | ICD-10-CM | POA: Diagnosis present

## 2021-09-17 DIAGNOSIS — E1165 Type 2 diabetes mellitus with hyperglycemia: Secondary | ICD-10-CM | POA: Diagnosis present

## 2021-09-17 DIAGNOSIS — J811 Chronic pulmonary edema: Secondary | ICD-10-CM

## 2021-09-17 DIAGNOSIS — K3189 Other diseases of stomach and duodenum: Secondary | ICD-10-CM

## 2021-09-17 DIAGNOSIS — D72819 Decreased white blood cell count, unspecified: Secondary | ICD-10-CM

## 2021-09-17 DIAGNOSIS — Z7984 Long term (current) use of oral hypoglycemic drugs: Secondary | ICD-10-CM

## 2021-09-17 DIAGNOSIS — J9601 Acute respiratory failure with hypoxia: Secondary | ICD-10-CM | POA: Diagnosis present

## 2021-09-17 DIAGNOSIS — Z87891 Personal history of nicotine dependence: Secondary | ICD-10-CM

## 2021-09-17 DIAGNOSIS — Z91040 Latex allergy status: Secondary | ICD-10-CM

## 2021-09-17 DIAGNOSIS — R161 Splenomegaly, not elsewhere classified: Secondary | ICD-10-CM | POA: Diagnosis present

## 2021-09-17 DIAGNOSIS — R059 Cough, unspecified: Secondary | ICD-10-CM

## 2021-09-17 DIAGNOSIS — J810 Acute pulmonary edema: Secondary | ICD-10-CM | POA: Diagnosis present

## 2021-09-17 DIAGNOSIS — Z8249 Family history of ischemic heart disease and other diseases of the circulatory system: Secondary | ICD-10-CM

## 2021-09-17 DIAGNOSIS — K209 Esophagitis, unspecified without bleeding: Secondary | ICD-10-CM

## 2021-09-17 DIAGNOSIS — D6959 Other secondary thrombocytopenia: Secondary | ICD-10-CM | POA: Diagnosis present

## 2021-09-17 DIAGNOSIS — R0789 Other chest pain: Secondary | ICD-10-CM

## 2021-09-17 DIAGNOSIS — R0682 Tachypnea, not elsewhere classified: Secondary | ICD-10-CM

## 2021-09-17 DIAGNOSIS — F32A Depression, unspecified: Secondary | ICD-10-CM | POA: Diagnosis present

## 2021-09-17 DIAGNOSIS — J181 Lobar pneumonia, unspecified organism: Secondary | ICD-10-CM | POA: Diagnosis present

## 2021-09-17 DIAGNOSIS — B3781 Candidal esophagitis: Secondary | ICD-10-CM

## 2021-09-17 DIAGNOSIS — K219 Gastro-esophageal reflux disease without esophagitis: Secondary | ICD-10-CM

## 2021-09-17 DIAGNOSIS — Z91048 Other nonmedicinal substance allergy status: Secondary | ICD-10-CM

## 2021-09-17 DIAGNOSIS — Z8701 Personal history of pneumonia (recurrent): Secondary | ICD-10-CM

## 2021-09-17 DIAGNOSIS — R6520 Severe sepsis without septic shock: Secondary | ICD-10-CM | POA: Diagnosis present

## 2021-09-17 DIAGNOSIS — K5900 Constipation, unspecified: Secondary | ICD-10-CM | POA: Diagnosis present

## 2021-09-17 DIAGNOSIS — Z885 Allergy status to narcotic agent status: Secondary | ICD-10-CM

## 2021-09-17 DIAGNOSIS — D693 Immune thrombocytopenic purpura: Secondary | ICD-10-CM | POA: Diagnosis present

## 2021-09-17 DIAGNOSIS — E872 Acidosis, unspecified: Secondary | ICD-10-CM | POA: Diagnosis present

## 2021-09-17 DIAGNOSIS — Z79899 Other long term (current) drug therapy: Secondary | ICD-10-CM

## 2021-09-17 DIAGNOSIS — E876 Hypokalemia: Secondary | ICD-10-CM | POA: Diagnosis present

## 2021-09-17 DIAGNOSIS — F418 Other specified anxiety disorders: Secondary | ICD-10-CM | POA: Diagnosis present

## 2021-09-17 DIAGNOSIS — I10 Essential (primary) hypertension: Secondary | ICD-10-CM

## 2021-09-17 LAB — CBC WITH DIFFERENTIAL/PLATELET
Abs Immature Granulocytes: 0.02 10*3/uL (ref 0.00–0.07)
Basophils Absolute: 0 10*3/uL (ref 0.0–0.1)
Basophils Relative: 0 %
Eosinophils Absolute: 0 10*3/uL (ref 0.0–0.5)
Eosinophils Relative: 0 %
HCT: 35 % — ABNORMAL LOW (ref 36.0–46.0)
Hemoglobin: 11.3 g/dL — ABNORMAL LOW (ref 12.0–15.0)
Immature Granulocytes: 1 %
Lymphocytes Relative: 19 %
Lymphs Abs: 0.5 10*3/uL — ABNORMAL LOW (ref 0.7–4.0)
MCH: 30.5 pg (ref 26.0–34.0)
MCHC: 32.3 g/dL (ref 30.0–36.0)
MCV: 94.3 fL (ref 80.0–100.0)
Monocytes Absolute: 0.2 10*3/uL (ref 0.1–1.0)
Monocytes Relative: 8 %
Neutro Abs: 2 10*3/uL (ref 1.7–7.7)
Neutrophils Relative %: 72 %
Platelets: 49 10*3/uL — ABNORMAL LOW (ref 150–400)
RBC: 3.71 MIL/uL — ABNORMAL LOW (ref 3.87–5.11)
RDW: 16.2 % — ABNORMAL HIGH (ref 11.5–15.5)
WBC: 2.7 10*3/uL — ABNORMAL LOW (ref 4.0–10.5)
nRBC: 0 % (ref 0.0–0.2)

## 2021-09-17 LAB — BASIC METABOLIC PANEL
Anion gap: 8 (ref 5–15)
BUN: 10 mg/dL (ref 6–20)
CO2: 23 mmol/L (ref 22–32)
Calcium: 8.7 mg/dL — ABNORMAL LOW (ref 8.9–10.3)
Chloride: 105 mmol/L (ref 98–111)
Creatinine, Ser: 0.58 mg/dL (ref 0.44–1.00)
GFR, Estimated: >60 mL/min (ref >60)
Glucose, Bld: 170 mg/dL — ABNORMAL HIGH (ref 70–99)
Potassium: 3.4 mmol/L — ABNORMAL LOW (ref 3.5–5.1)
Sodium: 136 mmol/L (ref 135–145)

## 2021-09-17 LAB — RESP PANEL BY RT-PCR (FLU A&B, COVID) ARPGX2
Influenza A by PCR: NEGATIVE
Influenza B by PCR: NEGATIVE
SARS Coronavirus 2 by RT PCR: NEGATIVE

## 2021-09-17 LAB — LACTIC ACID, PLASMA
Lactic Acid, Venous: 2.4 mmol/L (ref 0.5–1.9)
Lactic Acid, Venous: 1.8 mmol/L (ref 0.5–1.9)

## 2021-09-17 MED ORDER — IPRATROPIUM-ALBUTEROL 0.5-2.5 (3) MG/3ML IN SOLN
3.0000 mL | Freq: Once | RESPIRATORY_TRACT | Status: AC
  Administered 2021-09-17: 3 mL via RESPIRATORY_TRACT
  Filled 2021-09-17: qty 3

## 2021-09-17 MED ORDER — PROMETHAZINE-DM 6.25-15 MG/5ML PO SYRP: 5.0000 mL | ORAL_SOLUTION | Freq: Three times a day (TID) | ORAL | 0 refills | Status: DC | PRN

## 2021-09-17 MED ORDER — SODIUM CHLORIDE 0.9 % IV BOLUS
1000.0000 mL | Freq: Once | INTRAVENOUS | Status: AC
  Administered 2021-09-17: 1000 mL via INTRAVENOUS

## 2021-09-17 MED ORDER — SODIUM CHLORIDE 0.9 % IV SOLN
500.0000 mg | Freq: Once | INTRAVENOUS | Status: AC
  Administered 2021-09-17: 500 mg via INTRAVENOUS
  Filled 2021-09-17: qty 5

## 2021-09-17 MED ORDER — SODIUM CHLORIDE 0.9 % IV SOLN
1.0000 g | Freq: Once | INTRAVENOUS | Status: AC
  Administered 2021-09-17: 1 g via INTRAVENOUS
  Filled 2021-09-17: qty 10

## 2021-09-17 MED ORDER — AZITHROMYCIN 250 MG PO TABS: 250.0000 mg | ORAL_TABLET | Freq: Every day | ORAL | 0 refills | Status: DC

## 2021-09-17 MED ORDER — AMOXICILLIN-POT CLAVULANATE 875-125 MG PO TABS: 1.0000 | ORAL_TABLET | Freq: Two times a day (BID) | ORAL | 0 refills | Status: DC

## 2021-09-17 MED ORDER — PREDNISONE 50 MG PO TABS: 50.0000 mg | ORAL_TABLET | Freq: Every day | ORAL | 0 refills | Status: DC

## 2021-09-17 MED ORDER — ALBUTEROL SULFATE (2.5 MG/3ML) 0.083% IN NEBU
2.5000 mg | INHALATION_SOLUTION | Freq: Once | RESPIRATORY_TRACT | Status: AC
  Administered 2021-09-17: 2.5 mg via RESPIRATORY_TRACT
  Filled 2021-09-17: qty 3

## 2021-09-17 NOTE — ED Provider Notes (Signed)
[facility]    CSN: [id number] Arrival date & time: 09/17/21  0850      History   Chief Complaint Chief Complaint  Patient presents with   Shortness of Breath   Weakness    HPI ROSHONDA Stanley is a 56 y.o. female.   Presenting today with 2 days of progressively worsening cough, shortness of breath, fatigue, weakness.  States she has not slept in 2 days due to severe coughing, chest tightness.  She she has been coughing for months now, had pneumonia diagnosed in the emergency department 3 months ago and now feels the same as she did then.  Denies known fever, chills, body aches, abdominal pain, nausea vomiting or diarrhea.  Taking cough drops, over-the-counter cough syrup and Claritin with minimal relief.  No new sick contacts recently.  No known history of underlying chronic pulmonary disease.   Past Medical History:  Diagnosis Date   Anxiety    Depression    GERD (gastroesophageal reflux disease)    Hypercholesteremia    Hypertension    Hypothyroidism    Trimalleolar fracture of ankle, closed, right, sequela    Type 2 diabetes mellitus (HCC)     Patient Active Problem List   Diagnosis Date Noted   Hypothyroidism 06/05/2018   Type 2 diabetes mellitus (HCC) 06/05/2018   Hypophosphatemia 06/05/2018   Depression with anxiety 06/05/2018   Thrombocytopenia (HCC) 06/05/2018   Leukopenia 06/05/2018   Hypokalemia 06/05/2018   Chronic cough 12/06/2016   Precordial pain    Family history of early CAD    Chest pain 09/02/2015   Dysphagia 04/16/2012   Odynophagia 04/16/2012   Unspecified hypothyroidism 11/03/2009   Hyperlipidemia 11/03/2009   OBESITY, UNSPECIFIED 11/03/2009   DYSTHYMIC DISORDER 11/03/2009   Esophageal reflux 11/03/2009   Diaphragmatic hernia 11/03/2009   CHEST PAIN UNSPECIFIED 11/03/2009    Past Surgical History:  Procedure Laterality Date   ABDOMINAL HYSTERECTOMY     CARDIAC CATHETERIZATION N/A 09/10/2015   Procedure: Left Heart Cath and  Coronary Angiography;  Surgeon: Corky Crafts, MD;  Location: [facility] INVASIVE CV LAB;  Service: Cardiovascular;  Laterality: N/A;   CHOLECYSTECTOMY     OPEN REDUCTION INTERNAL FIXATION (ORIF) TIBIA/FIBULA FRACTURE Right 08/04/2016   Procedure: OPEN REDUCTION INTERNAL FIXATION (ORIF) RIGHT TRIMALLEOLAR FRACTURE;  Surgeon: Toni Arthurs, MD;  Location: [facility];  Service: Orthopedics;  Laterality: Right;   WRIST SURGERY      OB History   No obstetric history on file.      Home Medications    Prior to Admission medications   Medication Sig Start Date End Date Taking? Authorizing Provider  albuterol (VENTOLIN HFA) 108 (90 Base) MCG/ACT inhaler Inhale 1-2 puffs into the lungs every 6 (six) hours as needed for wheezing or shortness of breath. 06/28/21   Wallis Bamberg, PA-C  ALPRAZolam Prudy Feeler) 0.5 MG tablet Take 0.25-0.5 mg by mouth daily as needed for anxiety.  Patient not taking: Reported on 05/06/2021    [provider]  amoxicillin-clavulanate (AUGMENTIN) 875-125 MG tablet Take 1 tablet by mouth every 12 (twelve) hours. 09/17/21   Particia Nearing, PA-C  Aspirin-Acetaminophen-Caffeine (GOODY HEADACHE PO) Take 1 packet by mouth daily as needed (for pain/headache). Patient not taking: Reported on 05/06/2021    [provider]  azithromycin (ZITHROMAX Z-PAK) 250 MG tablet Take 1 tablet (250 mg total) by mouth daily.  PO day 1, then  PO days 205 09/17/21   Particia Nearing, PA-C  benzonatate [facility]) 100  MG capsule Take 1 capsule (100 mg total) by mouth every 8 (eight) hours. 10/10/20   Wurst, [place]Britt[place], PA-C  dexlansoprazole (DEXILANT) 60 MG capsule Take 1 capsule (60 mg total) by mouth daily. 04/16/12   Candice Stanley, Candice L, NP  famotidine (PEPCID) 20 MG tablet Take 20 mg by mouth at bedtime.    [provider]  FLUoxetine (PROZAC) 40 MG capsule Take 40 mg by mouth daily. 08/15/19   [provider]  fluticasone (FLOVENT HFA) 110  MCG/ACT inhaler Inhale 2 puffs into the lungs in the morning and at bedtime for 10 days. 07/08/21 07/18/21  Eber Stanley, Brian, MD  HYDROcodone-acetaminophen (NORCO/VICODIN) 5-325 MG tablet Take 1 tablet by mouth every 6 (six) hours as needed. Patient not taking: Reported on 05/06/2021 07/11/20   Bethann Stanley, Joseph, MD  levocetirizine (XYZAL) 5 MG tablet Take 1 tablet (5 mg total) by mouth every evening. 06/28/21   Wallis BambergMani, Mario, PA-C  loratadine (CLARITIN) 10 MG tablet Take 10 mg by mouth daily.    [provider]  losartan (COZAAR) 25 MG tablet Take 25 mg by mouth daily. 04/03/21   [provider]  metFORMIN (GLUCOPHAGE) 500 MG tablet Take by mouth 2 (two) times daily with a meal.    [provider]  Multiple Vitamins-Minerals (ZINC PO) Take 1 tablet by mouth daily.    [provider]  pantoprazole (PROTONIX) 40 MG tablet Take 1 tablet (40 mg total) by mouth daily. 05/12/21   Doreatha MassedKatragadda, Sreedhar, MD  predniSONE (DELTASONE) 50 MG tablet Take 1 tablet (50 mg total) by mouth daily with breakfast. 09/17/21   Particia NearingLane, Sharlee Rufino Elizabeth, PA-C  promethazine-dextromethorphan (PROMETHAZINE-DM) 6.25-15 MG/5ML syrup Take 5 mLs by mouth 3 (three) times daily as needed for cough. 09/17/21   Particia NearingLane, Lili Harts Elizabeth, PA-C  traZODone (DESYREL) 100 MG tablet Take 100 mg by mouth at bedtime.    [provider]  vitamin C (ASCORBIC ACID) 500 MG tablet Take 500 mg by mouth daily.     [provider]    Family History Family History  Problem Relation Age of Onset   Coronary artery disease Father    Heart disease Father    Barrett's esophagus Father    Hypertension Mother    Coronary artery disease Paternal Uncle    Coronary artery disease Cousin     Social History Social History   Tobacco Use   Smoking status: Former    Types: Cigarettes    Quit date: 05/20/1991    Years since quitting: 30.3   Smokeless tobacco: Never   Tobacco comments:    Used to smoke a pack in 1-2  weeks/ 1992  Vaping Use   Vaping Use: Never used  Substance Use Topics   Alcohol use: Not Currently    Alcohol/week: 0.0 standard drinks    Comment: social   Drug use: No     Allergies   Codeine, Dilaudid [hydromorphone hcl], Tape, Vicodin [hydrocodone-acetaminophen], and Latex   Review of Systems Review of Systems Per HPI  Physical Exam Triage Vital Signs ED Triage Vitals [09/17/21 0904]  Enc Vitals Group     BP (!) 149/82     Pulse Rate 82     Resp (!) 22     Temp 98.6 F (37 C)     Temp src      SpO2 91 %     Weight      Height      Head Circumference      Peak Flow  Pain Score      Pain Loc      Pain Edu?      Excl. in GC?    No data found.  Updated Vital Signs BP (!) 149/82    Pulse 82    Temp 98.6 F (37 C)    Resp (!) 22    SpO2 91%   Visual Acuity Right Eye Distance:   Left Eye Distance:   Bilateral Distance:    Right Eye Near:   Left Eye Near:    Bilateral Near:     Physical Exam Vitals and nursing note reviewed.  Constitutional:      Appearance: She is not ill-appearing.     Comments: Fatigued but alert, appropriate  HENT:     Head: Atraumatic.     Nose: Congestion present.     Mouth/Throat:     Mouth: Mucous membranes are moist.     Pharynx: Posterior oropharyngeal erythema present. No oropharyngeal exudate.  Eyes:     Extraocular Movements: Extraocular movements intact.     Conjunctiva/sclera: Conjunctivae normal.  Cardiovascular:     Rate and Rhythm: Normal rate and regular rhythm.     Heart sounds: Normal heart sounds.  Pulmonary:     Effort: Pulmonary effort is normal.     Breath sounds: Wheezing and rales present.     Comments: Mildly decreased breath sounds throughout, minimal wheezes scattered, mild Rales bilaterally at bases Musculoskeletal:        General: Normal range of motion.     Cervical back: Normal range of motion and neck supple.  Skin:    General: Skin is warm and dry.  Neurological:     Mental Status:  She is oriented to person, place, and time.  Psychiatric:        Mood and Affect: Mood normal.        Thought Content: Thought content normal.        Judgment: Judgment normal.     UC Treatments / Results  Labs (all labs ordered are listed, but only abnormal results are displayed) Labs Reviewed - No data to display  EKG   Radiology DG Chest 2 View  Result Date: 09/17/2021 CLINICAL DATA:  Progressively work is thinning shortness of breath with weakness. EXAM: CHEST - 2 VIEW COMPARISON:  07/08/2021 FINDINGS: Indistinct airspace type densities at the bases. No Kerley lines, effusion, or pneumothorax. Mild cardiomegaly. Stable mediastinal and hilar contours including hilar prominence which is likely vascular based on 07/08/2021 CT IMPRESSION: Infiltrates at both lung bases. Followup PA and lateral chest X-ray is recommended in 3-4 weeks following trial of antibiotic therapy to ensure resolution. Electronically Signed   By: Tiburcio Pea M.D.   On: 09/17/2021 09:24    Procedures Procedures (including critical care time)  Medications Ordered in UC Medications - No data to display  Initial Impression / Assessment and Plan / UC Course  I have reviewed the triage vital signs and the nursing notes.  Pertinent labs & imaging results that were available during my care of the patient were reviewed by me and considered in my medical decision making (see chart for details).     Borderline O2 saturation, tachypnea, mild hypertension in triage, O2 saturation improved to 91 to 92% at rest with deep breaths.  She is in no acute distress currently.  Discussed potential of going to the hospital for more support and monitoring but she adamantly declines.  Chest x-ray today showing bilateral lower lobe pneumonia, will treat  with azithromycin, Augmentin, prednisone, Phenergan DM in addition to inhaler regimen.  Return for any acutely worsening symptoms.  Final Clinical Impressions(s) / UC Diagnoses    Final diagnoses:  Pneumonia of both lower lobes due to infectious organism  Tachypnea  SOB (shortness of breath)   Discharge Instructions   None    ED Prescriptions     Medication Sig Dispense Auth. Provider   azithromycin (ZITHROMAX Z-PAK) 250 MG tablet Take 1 tablet (250 mg total) by mouth daily. 500mg  PO day 1, then 250mg  PO days 205 6 tablet , PA-C   amoxicillin-clavulanate (AUGMENTIN) 875-125 MG tablet Take 1 tablet by mouth every 12 (twelve) hours. 14 tablet , Particia Stanley   predniSONE (DELTASONE) 50 MG tablet Take 1 tablet (50 mg total) by mouth daily with breakfast. 5 tablet Particia Nearing, PA-C   promethazine-dextromethorphan (PROMETHAZINE-DM) 6.25-15 MG/5ML syrup Take 5 mLs by mouth 3 (three) times daily as needed for cough. 100 mL Particia Stanley, 09-12-1992      PDMP not reviewed this encounter.   Particia Stanley [place], Roosvelt Maser 09/17/21 [phone]

## 2021-09-17 NOTE — ED Triage Notes (Signed)
Found in the parking lot unable to get inside, came from urgent care, diagnosed with bilateral pneumonia ?

## 2021-09-17 NOTE — ED Provider Notes (Signed)
[facility] EMERGENCY DEPARTMENT Provider Note   CSN: [id number] Arrival date & time: 09/17/21  1752     History Chief Complaint  Patient presents with   Shortness of Breath    Candice Stanley is a 56 y.o. female with history of hypertension, type 2 diabetes, hyperlipidemia, depression, and anxiety who presents the emergency department with shortness of breath, chest tightness and worsening cough over the last 3 days.  Patient was seen evaluated at urgent care earlier today and was diagnosed with bilateral lower lobe pneumonia.  She was given azithromycin, Augmentin, prednisone, and Phenergan DM and an albuterol inhaler.  Patient is just having a hard time breathing which prompted her arrival to the emergency department today.  She states that she has not slept in 2 days secondary to her cough.   Shortness of Breath     Home Medications Prior to Admission medications   Medication Sig Start Date End Date Taking? Authorizing Provider  albuterol (VENTOLIN HFA) 108 (90 Base) MCG/ACT inhaler Inhale 1-2 puffs into the lungs every 6 (six) hours as needed for wheezing or shortness of breath. 06/28/21   Jaynee Eagles, PA-C  ALPRAZolam Duanne Moron) 0.5 MG tablet Take 0.25-0.5 mg by mouth daily as needed for anxiety.  Patient not taking: Reported on 05/06/2021    [provider]  amoxicillin-clavulanate (AUGMENTIN) 875-125 MG tablet Take 1 tablet by mouth every 12 (twelve) hours. 09/17/21   Volney American, PA-C  Aspirin-Acetaminophen-Caffeine (GOODY HEADACHE PO) Take 1 packet by mouth daily as needed (for pain/headache). Patient not taking: Reported on 05/06/2021    [provider]  azithromycin (ZITHROMAX Z-PAK) 250 MG tablet Take 1 tablet (250 mg total) by mouth daily. 500mg  PO day 1, then 250mg  PO days 205 09/17/21   Volney American, PA-C  benzonatate (TESSALON) 100 MG capsule Take 1 capsule (100 mg total) by mouth every 8 (eight) hours. 10/10/20   Wurst, [place], PA-C   dexlansoprazole (DEXILANT) 60 MG capsule Take 1 capsule (60 mg total) by mouth daily. 04/16/12   Andria Meuse, NP  famotidine (PEPCID) 20 MG tablet Take 20 mg by mouth at bedtime.    [provider]  FLUoxetine (PROZAC) 40 MG capsule Take 40 mg by mouth daily. 08/15/19   [provider]  fluticasone (FLOVENT HFA) 110 MCG/ACT inhaler Inhale 2 puffs into the lungs in the morning and at bedtime for 10 days. 07/08/21 07/18/21  Noemi Chapel, MD  HYDROcodone-acetaminophen (NORCO/VICODIN) 5-325 MG tablet Take 1 tablet by mouth every 6 (six) hours as needed. Patient not taking: Reported on 05/06/2021 07/11/20   Milton Ferguson, MD  levocetirizine (XYZAL) 5 MG tablet Take 1 tablet (5 mg total) by mouth every evening. 06/28/21   Jaynee Eagles, PA-C  loratadine (CLARITIN) 10 MG tablet Take 10 mg by mouth daily.    [provider]  losartan (COZAAR) 25 MG tablet Take 25 mg by mouth daily. 04/03/21   [provider]  metFORMIN (GLUCOPHAGE) 500 MG tablet Take by mouth 2 (two) times daily with a meal.    [provider]  Multiple Vitamins-Minerals (ZINC PO) Take 1 tablet by mouth daily.    [provider]  pantoprazole (PROTONIX) 40 MG tablet Take 1 tablet (40 mg total) by mouth daily. 05/12/21   Derek Jack, MD  predniSONE (DELTASONE) 50 MG tablet Take 1 tablet (50 mg total) by mouth daily with breakfast. 09/17/21   Volney American, PA-C  promethazine-dextromethorphan (PROMETHAZINE-DM) 6.25-15 MG/5ML syrup Take 5 mLs  by mouth 3 (three) times daily as needed for cough. 09/17/21   Volney American, PA-C  traZODone (DESYREL) 100 MG tablet Take 100 mg by mouth at bedtime.    [provider]  vitamin C (ASCORBIC ACID) 500 MG tablet Take 500 mg by mouth daily.     [provider]      Allergies    Codeine, Dilaudid [hydromorphone hcl], Tape, Vicodin [hydrocodone-acetaminophen], and Latex    Review of Systems   Review of Systems   Respiratory:  Positive for shortness of breath.   All other systems reviewed and are negative.  Physical Exam Updated Vital Signs BP (!) 157/68    Pulse 92    Temp 98.4 F (36.9 C)    Resp (!) 24    Ht 5\' 4"  (1.626 m)    Wt 121.6 kg    SpO2 93%    BMI 46.00 kg/m  Physical Exam Vitals and nursing note reviewed.  Constitutional:      General: She is not in acute distress.    Appearance: Normal appearance.  HENT:     Head: Normocephalic and atraumatic.  Eyes:     General:        Right eye: No discharge.        Left eye: No discharge.  Cardiovascular:     Comments: Regular rate and rhythm.  S1/S2 are distinct without any evidence of murmur, rubs, or gallops.  Radial pulses are 2+ bilaterally.  Dorsalis pedis pulses are 2+ bilaterally.  No evidence of pedal edema. Pulmonary:     Comments: Decreased breath sounds bilaterally and faint wheezing.  Tachypneic. Abdominal:     General: Abdomen is flat. Bowel sounds are normal. There is no distension.     Tenderness: There is no abdominal tenderness. There is no guarding or rebound.  Musculoskeletal:        General: Normal range of motion.     Cervical back: Neck supple.  Skin:    General: Skin is warm and dry.     Findings: No rash.  Neurological:     General: No focal deficit present.     Mental Status: She is alert.  Psychiatric:        Mood and Affect: Mood normal.        Behavior: Behavior normal.    ED Results / Procedures / Treatments   Labs (all labs ordered are listed, but only abnormal results are displayed) Labs Reviewed  RESP PANEL BY RT-PCR (FLU A&B, COVID) [id number]  CBC WITH DIFFERENTIAL/PLATELET  BASIC METABOLIC PANEL  LACTIC ACID, PLASMA  LACTIC ACID, PLASMA    EKG EKG Interpretation  Date/Time:  Friday September 17 2021 18:03:38 EST Ventricular Rate:  88 PR Interval:  150 QRS Duration: 93 QT Interval:  362 QTC Calculation: 438 R Axis:   62 Text Interpretation: Sinus rhythm Atrial premature complex Low  voltage, precordial leads Confirmed by Dorie Rank [phone]) on 09/17/2021 6:07:17 PM  Radiology DG Chest 2 View  Result Date: 09/17/2021 CLINICAL DATA:  Progressively work is thinning shortness of breath with weakness. EXAM: CHEST - 2 VIEW COMPARISON:  07/08/2021 FINDINGS: Indistinct airspace type densities at the bases. No Kerley lines, effusion, or pneumothorax. Mild cardiomegaly. Stable mediastinal and hilar contours including hilar prominence which is likely vascular based on 07/08/2021 CT IMPRESSION: Infiltrates at both lung bases. Followup PA and lateral chest X-ray is recommended in 3-4 weeks following trial of antibiotic therapy to ensure resolution. Electronically Signed   By: Roderic Palau  Watts M.D.   On: 09/17/2021 09:24    Procedures Procedures    Medications Ordered in ED Medications  ipratropium-albuterol (DUONEB) 0.5-2.5 (3) MG/3ML nebulizer solution 3 mL (3 mLs Nebulization Given 09/17/21 1835)    ED Course/ Medical Decision Making/ A&P                           Medical Decision Making Amount and/or Complexity of Data Reviewed Labs: ordered.  Risk Prescription drug management.   This patient presents to the ED for concern of new diagnosis of pneumonia and shortness of breath, this involves an extensive number of treatment options, and is a complaint that carries with it a high risk of complications and morbidity.  The differential diagnosis includes worsening pneumonia.  I have a low suspicion for cardiac causes of her shortness of breath including pleural effusions, CHF, ACS.   Co morbidities that complicate the patient evaluation  Diabetes High blood pressure High cholesterol   Additional history obtained:  Additional history obtained from nursing note and old records External records from outside source obtained and reviewed including urgent care note from earlier today which I highlighted above.   Lab Tests:  I Ordered, and personally interpreted labs.  The  pertinent results include: CBC which is still pending.  Initial lactic is 2.4.  BMP is pending.  COVID and flu are pending.   Imaging Studies ordered:  Chest x-ray was performed at urgent care today which showed bilateral lower lobe pneumonia.  I do not feel that repeating a chest x-ray is warranted at this time.   Problem List / ED Course:  Pneumonia and shortness of breath.  Majority of her work-up is still pending.  Respiratory care will be transferred to [facility], PA-C.  Given her new diagnosis pneumonia and worsening shortness of breath patient may have to come into the hospital for further evaluation and medical management.  Dispostion:  Ultimate disposition will be made by Marcene Brawn, PA-C after the rest of her work-up results.  Final Clinical Impression(s) / ED Diagnoses Final diagnoses:  None    Rx / DC Orders ED Discharge Orders     None         Cherrie Gauze 09/17/21 1904    Dorie Rank, MD 09/18/21 [phone]

## 2021-09-17 NOTE — H&P (Signed)
History and Physical    Patient: Candice Stanley JQB:[id number] DOB: 1965/09/04 DOA: 09/17/2021 DOS: the patient was seen and examined on 09/18/2021 PCP: Candice Stabile, MD  Patient coming from: Home  Chief Complaint:  Chief Complaint  Patient presents with   Shortness of Breath   HPI: Candice Stanley is a 56 y.o. female with medical history significant of hypertension, GERD, T2DM, chronic leukopenia thrombocytopenia (follows with Candice Stanley), anxiety and depression who presents to the emergency department due to several month onset of cough which progressively worsened within the last 2 days with production of brownish sputum which was associated with shortness of breath and fever up to 102F at home, she complained of chest and abdominal soreness from the cough and also complained of fatigue and weakness.  Patient states that she was diagnosed with pneumonia about 3 months ago and the current symptoms were similar to last episode of pneumonia, she states that she usually gets pneumonia during seasonal changes.  Patient complained of decreased oral intake within the last 2 days due to incessant cough with worsening shortness of breath whenever she eats.  Cough drops and over-the-counter cough syrup and Claritin only provided minimal relief.  She went to an urgent care this morning, chest x-ray was done and she was diagnosed to have bilateral lower lobe pneumonia, she was treated with Augmentin, azithromycin, prednisone and Phenergan and albuterol inhaler.  She decided to go to the ED for further evaluation and management due to worsening shortness of breath.  Patient also complained of minimal sleep within the last 2 days due to cough.  ED course: In the emergency department, she was tachypneic, BP was 108/42 and other vital signs were within normal range.  Work-up in the ED showed leukopenia and thrombocytopenia, hypokalemia, hyperglycemia.  Lactic acid 2.4 > 1.8.  Influenza A, B, SARS coronavirus 2  was negative. Chest x-ray showed infiltrates at both lung bases She was treated with IV ceftriaxone and azithromycin, breathing treatment with albuterol and DuoNebs were provided and IV hydration was given.  Hospitalist was asked to admit patient for further evaluation and management.    Review of Systems: As mentioned in the history of present illness. All other systems reviewed and are negative.  Past Medical History:  Diagnosis Date   Anxiety    Depression    GERD (gastroesophageal reflux disease)    Hypercholesteremia    Hypertension    Hypothyroidism    Trimalleolar fracture of ankle, closed, right, sequela    Type 2 diabetes mellitus (HCC)    Past Surgical History:  Procedure Laterality Date   ABDOMINAL HYSTERECTOMY     CARDIAC CATHETERIZATION N/A 09/10/2015   Procedure: Left Heart Cath and Coronary Angiography;  Surgeon: Candice Crafts, MD;  Location: [facility] INVASIVE CV LAB;  Service: Cardiovascular;  Laterality: N/A;   CHOLECYSTECTOMY     OPEN REDUCTION INTERNAL FIXATION (ORIF) TIBIA/FIBULA FRACTURE Right 08/04/2016   Procedure: OPEN REDUCTION INTERNAL FIXATION (ORIF) RIGHT TRIMALLEOLAR FRACTURE;  Surgeon: Candice Arthurs, MD;  Location: [facility];  Service: Orthopedics;  Laterality: Right;   WRIST SURGERY     Social History:  reports that she quit smoking about 30 years ago. Her smoking use included cigarettes. She has never used smokeless tobacco. She reports that she does not currently use alcohol. She reports that she does not use drugs.  Allergies  Allergen Reactions   Codeine Nausea And Vomiting   Dilaudid [Hydromorphone Hcl]     Drop in blood  pressure   Tape Swelling and Other (See Comments)    Rash(paper tape)   Vicodin [Hydrocodone-Acetaminophen] Nausea And Vomiting   Latex Rash    Family History  Problem Relation Age of Onset   Coronary artery disease Father    Heart disease Father    Barrett's esophagus Father    Hypertension Mother     Coronary artery disease Paternal Uncle    Coronary artery disease Cousin     Prior to Admission medications   Medication Sig Start Date End Date Taking? Authorizing Provider  albuterol (VENTOLIN HFA) 108 (90 Base) MCG/ACT inhaler Inhale 1-2 puffs into the lungs every 6 (six) hours as needed for wheezing or shortness of breath. 06/28/21   Candice Bamberg, PA-C  ALPRAZolam Prudy Feeler) 0.5 MG tablet Take 0.25-0.5 mg by mouth daily as needed for anxiety.  Patient not taking: Reported on 05/06/2021    [provider]  amoxicillin-clavulanate (AUGMENTIN) 875-125 MG tablet Take 1 tablet by mouth every 12 (twelve) hours. 09/17/21   Candice Nearing, PA-C  Aspirin-Acetaminophen-Caffeine (GOODY HEADACHE PO) Take 1 packet by mouth daily as needed (for pain/headache). Patient not taking: Reported on 05/06/2021    [provider]  azithromycin (ZITHROMAX Z-PAK) 250 MG tablet Take 1 tablet (250 mg total) by mouth daily. 500mg  PO day 1, then 250mg  PO days 205 09/17/21   , PA-C  benzonatate (TESSALON) 100 MG capsule Take 1 capsule (100 mg total) by mouth every 8 (eight) hours. 10/10/20   Candice Stanley, Candice Nearing, PA-C  dexlansoprazole (DEXILANT) 60 MG capsule Take 1 capsule (60 mg total) by mouth daily. 04/16/12   [place], NP  famotidine (PEPCID) 20 MG tablet Take 20 mg by mouth at bedtime.    [provider]  FLUoxetine (PROZAC) 40 MG capsule Take 40 mg by mouth daily. 08/15/19   [provider]  fluticasone (FLOVENT HFA) 110 MCG/ACT inhaler Inhale 2 puffs into the lungs in the morning and at bedtime for 10 days. 07/08/21 07/18/21  07/10/21, MD  HYDROcodone-acetaminophen (NORCO/VICODIN) 5-325 MG tablet Take 1 tablet by mouth every 6 (six) hours as needed. Patient not taking: Reported on 05/06/2021 07/11/20   05/08/2021, MD  levocetirizine (XYZAL) 5 MG tablet Take 1 tablet (5 mg total) by mouth every evening. 06/28/21   Candice Berkshire, PA-C  loratadine  (CLARITIN) 10 MG tablet Take 10 mg by mouth daily.    [provider]  losartan (COZAAR) 25 MG tablet Take 25 mg by mouth daily. 04/03/21   [provider]  metFORMIN (GLUCOPHAGE) 500 MG tablet Take by mouth 2 (two) times daily with a meal.    [provider]  Multiple Vitamins-Minerals (ZINC PO) Take 1 tablet by mouth daily.    [provider]  pantoprazole (PROTONIX) 40 MG tablet Take 1 tablet (40 mg total) by mouth daily. 05/12/21   04/05/21, MD  predniSONE (DELTASONE) 50 MG tablet Take 1 tablet (50 mg total) by mouth daily with breakfast. 09/17/21   Doreatha Massed, PA-C  promethazine-dextromethorphan (PROMETHAZINE-DM) 6.25-15 MG/5ML syrup Take 5 mLs by mouth 3 (three) times daily as needed for cough. 09/17/21   09-12-1990, PA-C  traZODone (DESYREL) 100 MG tablet Take 100 mg by mouth at bedtime.    [provider]  vitamin C (ASCORBIC ACID) 500 MG tablet Take 500 mg by mouth daily.     [provider]    Physical Exam: Vitals:   09/18/21 0030 09/18/21 0045 09/18/21 0100  09/18/21 0130  BP: (!) 114/58 (!) 124/56 (!) 137/57 (!) 126/46  Pulse: 77 79 87 86  Resp: (!) 27 (!) 27 (!) 32 (!) 30  Temp:      SpO2: 95% 95% 93% 93%  Weight:      Height:       General: Patient was awake and alert and oriented x3. Not in any acute distress.  HEENT: NCAT.  PERRLA. EOMI. Sclerae anicteric.  Moist mucosal membranes. Neck: Neck supple without lymphadenopathy. No carotid bruits. No masses palpated.  Cardiovascular: Regular rate with normal S1-S2 sounds. No murmurs, rubs or gallops auscultated. No JVD.  Respiratory: Tachypnea.  Bilateral lower lobe rhonchi on auscultation. Abdomen: Soft, nontender, nondistended. Active bowel sounds. No masses or hepatosplenomegaly  Skin: No rashes, lesions, or ulcerations.  Dry, warm to touch. Musculoskeletal:  2+ dorsalis pedis and radial pulses. Good ROM.  No contractures  Psychiatric:  Intact judgment and insight.  Mood appropriate to current condition. Neurologic: No focal neurological deficits. Strength is 5/5 x 4.  CN II - XII grossly intact.  Data Reviewed: Normal sinus rhythm at a rate of 83 bpm with PACs    Assessment and Plan: * Community acquired pneumonia Patient was started on ceftriaxone and azithromycin, we shall continue same at this time with plan to de-escalate/discontinue based on blood culture, sputum culture, urine Legionella, strep pneumo and procalcitonin Continue Tylenol as needed Continue Mucinex, Robitussin, DuoNebs as needed, incentive spirometry, flutter valve     Acute respiratory failure with hypoxia (HCC) Patient was reported to be hypoxic in the high 80s when ambulating, supplemental oxygen via [place] was provided at 2 LPM with improved oxygenation to 94% - 98%. Continue supplemental oxygen to maintain O2 sats > 92% with plan to wean patient off this as tolerated (patient does not use supplemental oxygen at baseline).  Chronic idiopathic thrombocytopenia (HCC) Platelets 49, patient without any bleeding She follows with Candice Stanley  Lactic acidosis Resolved (2.4 > 1.8)  Hypokalemia K+ is 3.4 K+ will be replenished Please monitor for AM K+ for further replenishmemnt   Chronic leukopenia WBC 2.7, patient follows with Candice Stanley Continue to monitor WBC  Essential hypertension Continue losartan  Type 2 diabetes mellitus with hyperglycemia (HCC) Hemoglobin A1c on 05/05/21 was 6.4 (prediabetes) Continue ISS and hypoglycemic protocol Metformin will be held at this time Continue carbohydrate modified diet Continue diet, lifestyle modification and weight loss   Depression with anxiety Continue Prozac and temporarily hold Xanax due to hypoxia  GERD (gastroesophageal reflux disease) Continue Protonix  Obesity, unspecified BMI 46.0Kg/m Continue diet, lifestyle modification and weight loss      Advance Care Planning:    Code Status: Prior   Consults: None  Family Communication: None at bedside  Severity of Illness: The appropriate patient status for this patient is INPATIENT. Inpatient status is judged to be reasonable and necessary in order to provide the required intensity of service to ensure the patient's safety. The patient's presenting symptoms, physical exam findings, and initial radiographic and laboratory data in the context of their chronic comorbidities is felt to place them at high risk for further clinical deterioration. Furthermore, it is not anticipated that the patient will be medically stable for discharge from the hospital within 2 midnights of admission.   * I certify that at the point of admission it is my clinical judgment that the patient will require inpatient hospital care spanning beyond 2 midnights from the point of admission due to high intensity of service, high  risk for further deterioration and high frequency of surveillance required.*  Author: Frankey Shownladapo Trygve Thal, DO 09/18/2021 1:51 AM  For on call review [email or web address].

## 2021-09-17 NOTE — ED Triage Notes (Signed)
Pt presents with sob and weakness, reports having cough since wednesday , pt unable to ambulate due to sob ?

## 2021-09-17 NOTE — ED Provider Notes (Signed)
Pt's care assumed at 7 pm  labs pending.  Pt diagnosed with pneumonia today at Urgent care.  Pt reports increased shortness of breath.  Pt has not started antibiotics.   ? Pt had 02 sats in upper 80's.  Pt reports she has had a cough since having pneumonia in October that has not gone away.  Pt reports cough is worse.  Pt complains of feeling weak.  Pt states she has felt like she would pass out.   ?Pt given albuterol,  Pt reports some relief during treatment.  ?Pt on 02 2 liters.   ?Chest xray reviewed and shows lower low infiltrates.  Pt has wbc of 2.7 and platelets of 49.  Pt is followed by Dr. Lamonte Richer for thrombocytopenia and leukopenia.  ? ?IV rocephin and zithromax ordered.  Pt given repeat albuterol neb treatment.  I will discuss with Hospitalist for admission  ?  ?Fransico Meadow, [place] ?09/17/21 2239 ? ?  ?Dorie Rank, MD ?09/18/21 1620 ? ?

## 2021-09-18 DIAGNOSIS — J181 Lobar pneumonia, unspecified organism: Secondary | ICD-10-CM

## 2021-09-18 DIAGNOSIS — R188 Other ascites: Secondary | ICD-10-CM

## 2021-09-18 DIAGNOSIS — I10 Essential (primary) hypertension: Secondary | ICD-10-CM

## 2021-09-18 DIAGNOSIS — E66813 Obesity, class 3: Secondary | ICD-10-CM

## 2021-09-18 DIAGNOSIS — A419 Sepsis, unspecified organism: Secondary | ICD-10-CM

## 2021-09-18 DIAGNOSIS — R6520 Severe sepsis without septic shock: Secondary | ICD-10-CM

## 2021-09-18 DIAGNOSIS — K7460 Unspecified cirrhosis of liver: Secondary | ICD-10-CM

## 2021-09-18 DIAGNOSIS — J9601 Acute respiratory failure with hypoxia: Secondary | ICD-10-CM

## 2021-09-18 DIAGNOSIS — D696 Thrombocytopenia, unspecified: Secondary | ICD-10-CM

## 2021-09-18 LAB — COMPREHENSIVE METABOLIC PANEL
ALT: 24 U/L (ref 0–44)
AST: 49 U/L — ABNORMAL HIGH (ref 15–41)
Albumin: 2.8 g/dL — ABNORMAL LOW (ref 3.5–5.0)
Alkaline Phosphatase: 103 U/L (ref 38–126)
Anion gap: 9 (ref 5–15)
BUN: 10 mg/dL (ref 6–20)
CO2: 21 mmol/L — ABNORMAL LOW (ref 22–32)
Calcium: 8.2 mg/dL — ABNORMAL LOW (ref 8.9–10.3)
Chloride: 106 mmol/L (ref 98–111)
Creatinine, Ser: 0.55 mg/dL (ref 0.44–1.00)
GFR, Estimated: >60 mL/min (ref >60)
Glucose, Bld: 222 mg/dL — ABNORMAL HIGH (ref 70–99)
Potassium: 3.4 mmol/L — ABNORMAL LOW (ref 3.5–5.1)
Sodium: 136 mmol/L (ref 135–145)
Total Bilirubin: 2 mg/dL — ABNORMAL HIGH (ref 0.3–1.2)
Total Protein: 6 g/dL — ABNORMAL LOW (ref 6.5–8.1)

## 2021-09-18 LAB — EXPECTORATED SPUTUM ASSESSMENT W GRAM STAIN, RFLX TO RESP C

## 2021-09-18 LAB — FOLATE: Folate: 17.7 ng/mL (ref >5.9)

## 2021-09-18 LAB — GLUCOSE, CAPILLARY
Glucose-Capillary: 143 mg/dL — ABNORMAL HIGH (ref 70–99)
Glucose-Capillary: 105 mg/dL — ABNORMAL HIGH (ref 70–99)
Glucose-Capillary: 153 mg/dL — ABNORMAL HIGH (ref 70–99)
Glucose-Capillary: 143 mg/dL — ABNORMAL HIGH (ref 70–99)

## 2021-09-18 LAB — BRAIN NATRIURETIC PEPTIDE: B Natriuretic Peptide: 154 pg/mL — ABNORMAL HIGH (ref 0.0–100.0)

## 2021-09-18 LAB — CBC
HCT: 33.9 % — ABNORMAL LOW (ref 36.0–46.0)
Hemoglobin: 11 g/dL — ABNORMAL LOW (ref 12.0–15.0)
MCH: 31.3 pg (ref 26.0–34.0)
MCHC: 32.4 g/dL (ref 30.0–36.0)
MCV: 96.3 fL (ref 80.0–100.0)
Platelets: 46 10*3/uL — ABNORMAL LOW (ref 150–400)
RBC: 3.52 MIL/uL — ABNORMAL LOW (ref 3.87–5.11)
RDW: 16.4 % — ABNORMAL HIGH (ref 11.5–15.5)
WBC: 2.4 10*3/uL — ABNORMAL LOW (ref 4.0–10.5)
nRBC: 0 % (ref 0.0–0.2)

## 2021-09-18 LAB — CBG MONITORING, ED: Glucose-Capillary: 121 mg/dL — ABNORMAL HIGH (ref 70–99)

## 2021-09-18 LAB — IRON AND TIBC
Iron: 47 ug/dL (ref 28–170)
Saturation Ratios: 15 % (ref 10.4–31.8)
TIBC: 318 ug/dL (ref 250–450)
UIBC: 271 ug/dL

## 2021-09-18 LAB — FERRITIN: Ferritin: 23 ng/mL (ref 11–307)

## 2021-09-18 LAB — VITAMIN B12: Vitamin B-12: 952 pg/mL — ABNORMAL HIGH (ref 180–914)

## 2021-09-18 LAB — MAGNESIUM: Magnesium: 1.6 mg/dL — ABNORMAL LOW (ref 1.7–2.4)

## 2021-09-18 LAB — TROPONIN I (HIGH SENSITIVITY)
Troponin I (High Sensitivity): 12 ng/L
Troponin I (High Sensitivity): 12 ng/L (ref <18)

## 2021-09-18 LAB — PHOSPHORUS: Phosphorus: 2.5 mg/dL (ref 2.5–4.6)

## 2021-09-18 LAB — HIV ANTIBODY (ROUTINE TESTING W REFLEX): HIV Screen 4th Generation wRfx: NONREACTIVE

## 2021-09-18 LAB — PROCALCITONIN: Procalcitonin: 0.17 ng/mL

## 2021-09-18 MED ORDER — SODIUM CHLORIDE 0.9 % IV SOLN
2.0000 g | INTRAVENOUS | Status: DC
  Administered 2021-09-18 – 2021-09-20 (×3): 2 g via INTRAVENOUS
  Filled 2021-09-18 (×3): qty 20

## 2021-09-18 MED ORDER — ALUM & MAG HYDROXIDE-SIMETH 200-200-20 MG/5ML PO SUSP
30.0000 mL | ORAL | Status: DC | PRN
Start: 2021-09-18 — End: 2021-09-25
  Administered 2021-09-18 – 2021-09-20 (×2): 30 mL via ORAL
  Filled 2021-09-18 (×2): qty 30

## 2021-09-18 MED ORDER — ALPRAZOLAM 0.25 MG PO TABS
0.2500 mg | ORAL_TABLET | Freq: Four times a day (QID) | ORAL | Status: DC | PRN
  Administered 2021-09-18 – 2021-09-21 (×3): 0.25 mg via ORAL
  Filled 2021-09-18 (×4): qty 1

## 2021-09-18 MED ORDER — POTASSIUM CHLORIDE CRYS ER 20 MEQ PO TBCR
40.0000 meq | EXTENDED_RELEASE_TABLET | Freq: Once | ORAL | Status: AC
  Administered 2021-09-18: 40 meq via ORAL
  Filled 2021-09-18: qty 2

## 2021-09-18 MED ORDER — INSULIN ASPART 100 UNIT/ML IJ SOLN
0.0000 [IU] | Freq: Three times a day (TID) | INTRAMUSCULAR | Status: DC
  Administered 2021-09-18: 2 [IU] via SUBCUTANEOUS
  Administered 2021-09-18: 3 [IU] via SUBCUTANEOUS
  Administered 2021-09-19: 2 [IU] via SUBCUTANEOUS
  Administered 2021-09-19: 3 [IU] via SUBCUTANEOUS
  Administered 2021-09-19 – 2021-09-20 (×3): 2 [IU] via SUBCUTANEOUS
  Administered 2021-09-21 – 2021-09-22 (×2): 3 [IU] via SUBCUTANEOUS
  Administered 2021-09-22 – 2021-09-23 (×3): 2 [IU] via SUBCUTANEOUS
  Administered 2021-09-24: 3 [IU] via SUBCUTANEOUS
  Administered 2021-09-24: 5 [IU] via SUBCUTANEOUS
  Administered 2021-09-24: 8 [IU] via SUBCUTANEOUS
  Administered 2021-09-25: 2 [IU] via SUBCUTANEOUS

## 2021-09-18 MED ORDER — IPRATROPIUM-ALBUTEROL 0.5-2.5 (3) MG/3ML IN SOLN
3.0000 mL | RESPIRATORY_TRACT | Status: DC | PRN
Start: 2021-09-18 — End: 2021-09-25
  Administered 2021-09-19 – 2021-09-20 (×2): 3 mL via RESPIRATORY_TRACT
  Filled 2021-09-18 (×2): qty 3

## 2021-09-18 MED ORDER — ALPRAZOLAM 0.25 MG PO TABS
0.2500 mg | ORAL_TABLET | Freq: Three times a day (TID) | ORAL | Status: DC | PRN
  Administered 2021-09-18: 0.25 mg via ORAL
  Filled 2021-09-18: qty 1

## 2021-09-18 MED ORDER — CALCIUM CARBONATE ANTACID 500 MG PO CHEW
2.0000 | CHEWABLE_TABLET | Freq: Two times a day (BID) | ORAL | Status: DC | PRN
  Administered 2021-09-18 – 2021-09-23 (×2): 400 mg via ORAL
  Filled 2021-09-18 (×2): qty 2

## 2021-09-18 MED ORDER — ASCORBIC ACID 500 MG PO TABS
500.0000 mg | ORAL_TABLET | Freq: Two times a day (BID) | ORAL | Status: DC
  Administered 2021-09-18 – 2021-09-25 (×14): 500 mg via ORAL
  Filled 2021-09-18 (×14): qty 1

## 2021-09-18 MED ORDER — DM-GUAIFENESIN ER 30-600 MG PO TB12
1.0000 | ORAL_TABLET | Freq: Two times a day (BID) | ORAL | Status: DC
  Administered 2021-09-18 – 2021-09-23 (×5): 1 via ORAL
  Filled 2021-09-18 (×12): qty 1

## 2021-09-18 MED ORDER — FLUOXETINE HCL 20 MG PO CAPS
40.0000 mg | ORAL_CAPSULE | Freq: Every day | ORAL | Status: DC
  Administered 2021-09-18 – 2021-09-25 (×7): 40 mg via ORAL
  Filled 2021-09-18 (×7): qty 2

## 2021-09-18 MED ORDER — METHYLPREDNISOLONE SODIUM SUCC 40 MG IJ SOLR: 40.0000 mg | Freq: Two times a day (BID) | INTRAMUSCULAR | Status: DC

## 2021-09-18 MED ORDER — PANTOPRAZOLE SODIUM 40 MG PO TBEC
40.0000 mg | DELAYED_RELEASE_TABLET | Freq: Every day | ORAL | Status: DC
Start: 2021-09-18 — End: 2021-09-19
  Administered 2021-09-18 – 2021-09-19 (×2): 40 mg via ORAL
  Filled 2021-09-18 (×2): qty 1

## 2021-09-18 MED ORDER — LOSARTAN POTASSIUM 50 MG PO TABS
25.0000 mg | ORAL_TABLET | Freq: Every day | ORAL | Status: DC
  Administered 2021-09-18 – 2021-09-23 (×5): 25 mg via ORAL
  Filled 2021-09-18 (×5): qty 1

## 2021-09-18 MED ORDER — ACETAMINOPHEN 325 MG PO TABS
650.0000 mg | ORAL_TABLET | Freq: Four times a day (QID) | ORAL | Status: DC | PRN
  Administered 2021-09-18 – 2021-09-22 (×4): 650 mg via ORAL
  Filled 2021-09-18 (×4): qty 2

## 2021-09-18 MED ORDER — GUAIFENESIN-DM 100-10 MG/5ML PO SYRP
5.0000 mL | ORAL_SOLUTION | ORAL | Status: DC | PRN
  Administered 2021-09-18 – 2021-09-21 (×5): 5 mL via ORAL
  Filled 2021-09-18 (×6): qty 5

## 2021-09-18 MED ORDER — SODIUM CHLORIDE 0.9 % IV SOLN: 500.0000 mg | INTRAVENOUS | Status: DC

## 2021-09-18 MED ORDER — INSULIN ASPART 100 UNIT/ML IJ SOLN
0.0000 [IU] | Freq: Every day | INTRAMUSCULAR | Status: DC
  Administered 2021-09-23: 3 [IU] via SUBCUTANEOUS

## 2021-09-18 MED ORDER — PNEUMOCOCCAL 20-VAL CONJ VACC 0.5 ML IM SUSY: 0.5000 mL | PREFILLED_SYRINGE | INTRAMUSCULAR | Status: DC

## 2021-09-18 MED ORDER — MAGIC MOUTHWASH W/LIDOCAINE
10.0000 mL | Freq: Four times a day (QID) | ORAL | Status: DC
  Administered 2021-09-18 – 2021-09-25 (×26): 10 mL via ORAL
  Filled 2021-09-18 (×25): qty 10

## 2021-09-18 MED ORDER — MENTHOL 3 MG MT LOZG
1.0000 | LOZENGE | OROMUCOSAL | Status: DC | PRN
  Administered 2021-09-18: 3 mg via ORAL
  Filled 2021-09-18: qty 9

## 2021-09-18 MED ORDER — SODIUM CHLORIDE 0.9 % IV SOLN
500.0000 mg | INTRAVENOUS | Status: DC
  Administered 2021-09-18 – 2021-09-20 (×3): 500 mg via INTRAVENOUS
  Filled 2021-09-18 (×3): qty 5

## 2021-09-18 MED ORDER — ZINC SULFATE 220 (50 ZN) MG PO CAPS
220.0000 mg | ORAL_CAPSULE | Freq: Every day | ORAL | Status: DC
  Administered 2021-09-18 – 2021-09-25 (×7): 220 mg via ORAL
  Filled 2021-09-18 (×7): qty 1

## 2021-09-18 MED ORDER — SODIUM CHLORIDE 0.9 % IV SOLN: 1.0000 g | INTRAVENOUS | Status: DC

## 2021-09-18 MED ORDER — MAGNESIUM SULFATE 2 GM/50ML IV SOLN
2.0000 g | Freq: Once | INTRAVENOUS | Status: AC
  Administered 2021-09-18: 2 g via INTRAVENOUS
  Filled 2021-09-18: qty 50

## 2021-09-18 MED ORDER — LORATADINE 10 MG PO TABS
10.0000 mg | ORAL_TABLET | Freq: Every day | ORAL | Status: DC
  Administered 2021-09-18 – 2021-09-25 (×7): 10 mg via ORAL
  Filled 2021-09-18 (×7): qty 1

## 2021-09-18 NOTE — Assessment & Plan Note (Signed)
Secondary to splenomegaly and underlying liver cirrhosis ?Has been progressive since 2018 ?Follows Dr. Ellin Saba ?BMBX on 04/21/2021 showed slightly hypercellular bone marrow with trilineage hematopoiesis.  Slight lymphocytosis of primarily small lymphoid cells with lack of significant aggregates.  Chromosome analysis is normal. ?Monitor for signs of bleeding ?

## 2021-09-18 NOTE — Assessment & Plan Note (Signed)
-  replete °

## 2021-09-18 NOTE — Assessment & Plan Note (Signed)
Platelets 49, patient without any bleeding ?She follows with Dr. Ellin Saba ?

## 2021-09-18 NOTE — Assessment & Plan Note (Signed)
Continue losartan

## 2021-09-18 NOTE — Hospital Course (Addendum)
56 year old female with a history of anxiety/depression, hypertension, hyperlipidemia, diabetes mellitus type 2, thrombocytopenia and leukopenia presenting with 2-day history of generalized weakness, worsening shortness of breath and cough with brown sputum.  The patient states that she has been coughing to the point of having some posttussive emesis.  She denies any fevers, chills, chest pain, headache, neck pain, nausea, vomiting, diarrhea, abdominal pain.  She did have a small amount of blood-tinged sputum associated with an incessant cough.  She denied any worsening lower extremity edema orthopnea type symptoms.  She denies any anginal type symptoms or worsening shortness of breath with activity.  She went to urgent care on the morning of admission, she was given a prescription for Augmentin, azithromycin, and prednisone.  She states that she had 1 dose of the medications without improvement.  As result she presented for further evaluation.  She has remote history of tobacco only smoking a few cigarettes daily for 5 years, quitting in 1992.. In the ED, the patient was afebrile and hemodynamically stable with oxygen saturation 88% on room air.  BMP showed sodium 136, potassium 3.4, bicarbonate 23, BUN 10, creatinine 0.58.  WBC 2.7, hemoglobin 11.3, platelets 49,000.  The patient was started on ceftriaxone and azithromycin.  3/11-3/12--developed solid and liquid odynophagia;  small volume hemoptysis mixed with sputum.  Sob slowly improving.  No vomiting, diarrhea.  EKG and troponins not suggestive ACS.  GI consulted  3/13--continues to have odynophagia and epigastric pain with solid and liquids.  No n/v/d.  CT abd/pelvis ordered.  Planning EGD 3/14.  Weaning off oxygen.  CTA chest neg for PE

## 2021-09-18 NOTE — Assessment & Plan Note (Addendum)
Personally reviewed chest x-ray--bibasilar infiltrates, increased interstitial markings --3/12 CTA chest--no PE;  Widespread GGO and airspace consolidation Initially started on ceftriaxone and azithromycin and subsequently transition to p.o. She has completed a course of antibiotics in the hospital Started on hycodan for persistent cough Overall wheezing appears to have improved, will discontinue further steroids Will need repeat chest xray in 3-4 weeks

## 2021-09-18 NOTE — Assessment & Plan Note (Signed)
BMI 46.0Kg/m? ?Continue diet, lifestyle modification and weight loss ?

## 2021-09-18 NOTE — Assessment & Plan Note (Signed)
BMI 47.44 ?Lifestyle modification ?

## 2021-09-18 NOTE — Assessment & Plan Note (Addendum)
Secondary to splenomegaly and underlying liver cirrhosis as discussed above ?Follows Dr. Ellin Saba ?BMBX on 04/21/2021 showed slightly hypercellular bone marrow with trilineage hematopoiesis.  Slight lymphocytosis of primarily small lymphoid cells with lack of significant aggregates.  Chromosome analysis is normal. ?

## 2021-09-18 NOTE — Assessment & Plan Note (Addendum)
Hemoglobin A1c on 05/05/21 was 6.4 (prediabetes) ?Continue ISS and hypoglycemic protocol ?Metformin will be held at this time ?Continue carbohydrate modified diet ?Continue diet, lifestyle modification and weight loss ? ?

## 2021-09-18 NOTE — Assessment & Plan Note (Signed)
Continue Protonix °

## 2021-09-18 NOTE — Progress Notes (Signed)
Patient states that she is still having really bad pain from her throat to her abdomen. She states that it is an 8 on a scale of 0-10 when swallowing food. MD Tat notified.  ?

## 2021-09-18 NOTE — Assessment & Plan Note (Addendum)
Holding metformin ?NovoLog sliding scale ?09/17/21 hemoglobin A1c--6.0 ?

## 2021-09-18 NOTE — Assessment & Plan Note (Signed)
Patient was started on ceftriaxone and azithromycin, we shall continue same at this time with plan to de-escalate/discontinue based on blood culture, sputum culture, urine Legionella, strep pneumo and procalcitonin ?Continue Tylenol as needed ?Continue Mucinex, Robitussin, DuoNebs as needed, incentive spirometry, flutter valve  ? ? ?

## 2021-09-18 NOTE — Assessment & Plan Note (Addendum)
07/08/2021 CTA chest suggested cirrhotic liver changes on imaging with ascites 09/19/21 CTA chest>>hepatic cirrhosis with minimal ascites Patient has signs of portal hypertension and splenomegaly Suspect she has underlying NASH

## 2021-09-18 NOTE — Assessment & Plan Note (Addendum)
Presented with oxygen saturation 88% on room air with tachypnea Due to pneumonia and pulm edema Stable on 2 L nasal cannula Wean oxygen as tolerated for saturation greater 92% 3/12 CTA chest multifocal areas of GGO and consolidation; no PE 3/12 and 3/13>>lasix IV Echo

## 2021-09-18 NOTE — Progress Notes (Addendum)
PROGRESS NOTE  Candice Stanley DGU:[id number] DOB: Oct 21, 1965 DOA: 09/17/2021 PCP: Benita Stabile, MD  Brief History:  56 year old female with a history of anxiety/depression, hypertension, hyperlipidemia, diabetes mellitus type 2, thrombocytopenia and leukopenia presenting with 2-day history of generalized weakness, worsening shortness of breath and cough with brown sputum.  The patient states that she has been coughing to the point of having some posttussive emesis.  She denies any fevers, chills, chest pain, headache, neck pain, nausea, vomiting, diarrhea, abdominal pain.  She did have a small amount of blood-tinged sputum associated with an incessant cough.  She denied any worsening lower extremity edema orthopnea type symptoms.  She denies any anginal type symptoms or worsening shortness of breath with activity.  She went to urgent care on the morning of admission, she was given a prescription for Augmentin, azithromycin, and prednisone.  She states that she had 1 dose of the medications without improvement.  As result she presented for further evaluation.  She has remote history of tobacco only smoking a few cigarettes daily for 5 years, quitting in 1992.. In the ED, the patient was afebrile and hemodynamically stable with oxygen saturation 88% on room air.  BMP showed sodium 136, potassium 3.4, bicarbonate 23, BUN 10, creatinine 0.58.  WBC 2.7, hemoglobin 11.3, platelets 49,000.  The patient was started on ceftriaxone and azithromycin.    Assessment and Plan: * Severe sepsis (HCC) Presented with fever, leukopenia, tachypnea, and elevated lactate with respiratory failure Secondary to pneumonia Lactic acid peaked 2.4>> Follow-up PCT Continue ceftriaxone and azithromycin Follow blood cultures  Hypokalemia K+ is 3.4 K+ will be replenished Please monitor for AM K+ for further replenishmemnt   Lobar pneumonia (HCC) Personally reviewed chest x-ray--bibasilar infiltrates, increased  interstitial markings Continue ceftriaxone and azithromycin Urine Legionella antigen Urine Streptococcus pneumoniae antigen Follow-up PCT  Acute respiratory failure with hypoxia (HCC) Presented with oxygen saturation 88% on room air with tachypnea Stable on 2 L nasal cannula Wean oxygen as tolerated for saturation greater 92%  Cirrhosis of liver with ascites (HCC) 07/08/2021 CTA chest suggested cirrhotic liver changes on imaging with ascites Patient has signs of portal hypertension and splenomegaly Suspect she has underlying NASH  Thrombocytopenia (HCC) Secondary to splenomegaly and underlying liver cirrhosis Has been progressive since 2018 Follows Dr. Ellin Saba [facility] on 04/21/2021 showed slightly hypercellular bone marrow with trilineage hematopoiesis.  Slight lymphocytosis of primarily small lymphoid cells with lack of significant aggregates.  Chromosome analysis is normal. Monitor for signs of bleeding  Chronic leukopenia Secondary to splenomegaly and underlying liver cirrhosis as discussed above Follows Dr. Noralyn Pick on 04/21/2021 showed slightly hypercellular bone marrow with trilineage hematopoiesis.  Slight lymphocytosis of primarily small lymphoid cells with lack of significant aggregates.  Chromosome analysis is normal.  Hypomagnesemia replete  Obesity, Class III, BMI 40-49.9 (morbid obesity) (HCC) BMI 47.44 Lifestyle modification  Essential hypertension Continue losartan  Depression with anxiety Continue Prozac PDMP reviewed--last alprazolam Rx 11/25/20  Uncontrolled type 2 diabetes mellitus with hyperglycemia, without long-term current use of insulin (HCC) Holding metformin NovoLog sliding scale Check hemoglobin A1c  GERD (gastroesophageal reflux disease) Continue Protonix         Status is: Inpatient Remains inpatient appropriate because: severity of illness requiring oxygen and IV antibiotics    Family Communication:   no Family at  bedside  Consultants:  none  Code Status:  FULL   DVT Prophylaxis:  SCDs   Procedures: As Listed in Progress  Note Above  Antibiotics: Ceftriaxone 3/10>> Azithro 3/10>>      Subjective: Patient states that her breathing is little better than yesterday.  She still has dyspnea on exertion cough with brown sputum.  She denies any chest pain, nausea, vomiting, diarrhea, abdominal pain.  Objective: Vitals:   09/18/21 0200 09/18/21 0215 09/18/21 0312 09/18/21 0756  BP: (!) 142/63 (!) 141/73 (!) 155/76 (!) 117/57  Pulse: 77 71 77 70  Resp: (!) 24 (!) Temp:   98.6 F (37 C) 98.7 F (37.1 C)  TempSrc:   Oral Oral  SpO2: 95% 96% 94% 94%  Weight:   125.4 kg   Height:    (1.626 m)     Intake/Output Summary (Last 24 hours) at 09/18/2021 0824 Last data filed at 09/18/2021 0016 Gross per 24 hour  Intake 1347.82 ml  Output --  Net 1347.82 ml   Weight change:  Exam:  General:  Pt is alert, follows commands appropriately, not in acute distress HEENT: No icterus, No thrush, No neck mass, [place]/AT Cardiovascular: RRR, S1/S2, no rubs, no gallops Respiratory: Bilateral rales, left greater than right.  No wheezing. Abdomen: Soft/+BS, non tender, non distended, no guarding Extremities: trace LE edema, No lymphangitis, No petechiae, No rashes, no synovitis   Data Reviewed: I have personally reviewed following labs and imaging studies Basic Metabolic Panel: Recent Labs  Lab 09/17/21 1835 09/18/21 0443  NA 136 136  K 3.4* 3.4*  CL 105 106  CO2 23 21*  GLUCOSE 170* 222*  BUN 10 10  CREATININE 0.58 0.55  CALCIUM 8.7* 8.2*  MG  --  1.6*  PHOS  --  2.5   Liver Function Tests: Recent Labs  Lab 09/18/21 0443  AST 49*  ALT 24  ALKPHOS 103  BILITOT 2.0*  PROT 6.0*  ALBUMIN 2.8*   No results for input(s): LIPASE, AMYLASE in the last 168 hours. No results for input(s): AMMONIA in the last 168 hours. Coagulation Profile: No results for input(s): INR, PROTIME  in the last 168 hours. CBC: Recent Labs  Lab 09/17/21 1835 09/18/21 0443  WBC 2.7* 2.4*  NEUTROABS 2.0  --   HGB 11.3* 11.0*  HCT 35.0* 33.9*  MCV 94.3 96.3  PLT 49* 46*   Cardiac Enzymes: No results for input(s): CKTOTAL, CKMB, CKMBINDEX, TROPONINI in the last 168 hours. BNP: Invalid input(s): POCBNP CBG: Recent Labs  Lab 09/18/21 0200 09/18/21 0758  GLUCAP 121* 143*   HbA1C: No results for input(s): HGBA1C in the last 72 hours. Urine analysis:    Component Value Date/Time   COLORURINE YELLOW 10/16/2018 2123   APPEARANCEUR CLEAR 10/16/2018 2123   LABSPEC 1.009 10/16/2018 2123   PHURINE 5.0 10/16/2018 2123   GLUCOSEU NEGATIVE 10/16/2018 2123   HGBUR NEGATIVE 10/16/2018 2123   BILIRUBINUR NEGATIVE 10/16/2018 2123   KETONESUR NEGATIVE 10/16/2018 2123   PROTEINUR NEGATIVE 10/16/2018 2123   UROBILINOGEN 0.2 08/26/2013 0500   NITRITE NEGATIVE 10/16/2018 2123   LEUKOCYTESUR NEGATIVE 10/16/2018 2123   Sepsis Labs: (procalcitonin:4,lacticidven:4) ) Recent Results (from the past 240 hour(s))  Resp Panel by RT-PCR (Flu A&B, Covid) Nasopharyngeal Swab     Status: None   Collection Time: 09/17/21  7:15 PM   Specimen: Nasopharyngeal Swab; Nasopharyngeal(NP) swabs in vial transport medium  Result Value Ref Range Status   SARS Coronavirus 2 by RT PCR NEGATIVE NEGATIVE Final    Comment: (NOTE) SARS-CoV-2 target nucleic acids are NOT DETECTED.  The SARS-CoV-2 RNA is generally detectable  in upper respiratory specimens during the acute phase of infection. The lowest concentration of SARS-CoV-2 viral copies this assay can detect is 138 copies/mL. A negative result does not preclude SARS-Cov-2 infection and should not be used as the sole basis for treatment or other patient management decisions. A negative result may occur with  improper specimen collection/handling, submission of specimen other than nasopharyngeal swab, presence of viral mutation(s) within  the areas targeted by this assay, and inadequate number of viral copies(<138 copies/mL). A negative result must be combined with clinical observations, patient history, and epidemiological information. The expected result is Negative.  Fact Sheet for Patients:  BloggerCourse.com  Fact Sheet for Healthcare Providers:  SeriousBroker.it  This test is no t yet approved or cleared by the [place] FDA and  has been authorized for detection and/or diagnosis of SARS-CoV-2 by FDA under an Emergency Use Authorization (EUA). This EUA will remain  in effect (meaning this test can be used) for the duration of the COVID-19 declaration under Section 564(b)(1) of the Act, 21 U.S.C.section 360bbb-3(b)(1), unless the authorization is terminated  or revoked sooner.       Influenza A by PCR NEGATIVE NEGATIVE Final   Influenza B by PCR NEGATIVE NEGATIVE Final    Comment: (NOTE) The Xpert Xpress SARS-CoV-2/FLU/RSV plus assay is intended as an aid in the diagnosis of influenza from Nasopharyngeal swab specimens and should not be used as a sole basis for treatment. Nasal washings and aspirates are unacceptable for Xpert Xpress SARS-CoV-2/FLU/RSV testing.  Fact Sheet for Patients: BloggerCourse.com  Fact Sheet for Healthcare Providers: SeriousBroker.it  This test is not yet approved or cleared by the [place] FDA and has been authorized for detection and/or diagnosis of SARS-CoV-2 by FDA under an Emergency Use Authorization (EUA). This EUA will remain in effect (meaning this test can be used) for the duration of the COVID-19 declaration under Section 564(b)(1) of the Act, 21 U.S.C. section 360bbb-3(b)(1), unless the authorization is terminated or revoked.  Performed at [facility], [address], [place], [place] [address]   Culture, blood (routine x 2)     Status: None (Preliminary  result)   Collection Time: 09/17/21  7:15 PM   Specimen: Right Antecubital; Blood  Result Value Ref Range Status   Specimen Description RIGHT ANTECUBITAL  Final   Special Requests   Final    BOTTLES DRAWN AEROBIC AND ANAEROBIC Blood Culture results may not be optimal due to an excessive volume of blood received in culture bottles   Culture   Final    NO GROWTH < 12 HOURS Performed at [facility], [address] [place], [place] [address]    Report Status PENDING  Incomplete  Culture, blood (routine x 2)     Status: None (Preliminary result)   Collection Time: 09/17/21  7:22 PM   Specimen: BLOOD RIGHT HAND  Result Value Ref Range Status   Specimen Description BLOOD RIGHT HAND  Final   Special Requests   Final    BOTTLES DRAWN AEROBIC AND ANAEROBIC Blood Culture results may not be optimal due to an excessive volume of blood received in culture bottles   Culture   Final    NO GROWTH < 12 HOURS Performed at [facility], [address], [place], [place] [address]    Report Status PENDING  Incomplete  Expectorated Sputum Assessment w Gram Stain, Rflx to Resp Cult     Status: None (Preliminary result)   Collection Time: 09/18/21  3:02 AM  Specimen: Sputum  Result Value Ref Range Status   Specimen Description SPUTUM  Final   Special Requests NONE  Final   Sputum evaluation   Final    Sputum specimen not acceptable for testing.  Please recollect.   SPOKE TO TORI RN @ 0800 ON [phone] BY HENDERSON L CORRECTED ON 03/11 AT [id number]: PREVIOUSLY REPORTED AS THIS SPECIMEN IS ACCEPTABLE FOR SPUTUM CULTURE Performed at [facility], [address] [place], [place] [address]    Report Status PENDING  Incomplete     Scheduled Meds:  vitamin C  500 mg Oral BID   dextromethorphan-guaiFENesin  1 tablet Oral BID   FLUoxetine  40 mg Oral Daily   insulin aspart  0-15 Units Subcutaneous TID WC   insulin aspart  0-5 Units Subcutaneous QHS   losartan  25 mg Oral Daily   pantoprazole  40 mg Oral Daily    [START ON 09/19/2021] pneumococcal 20-valent conjugate vaccine  0.5 mL Intramuscular Tomorrow-1000   zinc sulfate  220 mg Oral Daily   Continuous Infusions:  azithromycin     cefTRIAXone (ROCEPHIN)  IV     magnesium sulfate bolus IVPB      Procedures/Studies: DG Chest 2 View  Result Date: 09/17/2021 CLINICAL DATA:  Progressively work is thinning shortness of breath with weakness. EXAM: CHEST - 2 VIEW COMPARISON:  07/08/2021 FINDINGS: Indistinct airspace type densities at the bases. No Kerley lines, effusion, or pneumothorax. Mild cardiomegaly. Stable mediastinal and hilar contours including hilar prominence which is likely vascular based on 07/08/2021 CT IMPRESSION: Infiltrates at both lung bases. Followup PA and lateral chest X-ray is recommended in 3-4 weeks following trial of antibiotic therapy to ensure resolution. Electronically Signed   By: Tiburcio PeaJonathan  Watts M.D.   On: 09/17/2021 09:24    Catarina Hartshornavid Raelie Lohr, DO  [facility]  If 7PM-7AM, please contact night-coverage [email or web address] Password [email or web address] 09/18/2021, 8:24 AM   LOS: 0 days

## 2021-09-18 NOTE — Assessment & Plan Note (Addendum)
Continue Prozac ?PDMP reviewed--last alprazolam Rx 11/25/20 ?

## 2021-09-18 NOTE — Progress Notes (Signed)
Patient states she is having a burning feeling from her throat down to her stomach. Patient has been given Maalox and MD Tat notified.  ? ?New orders were placed.  ?

## 2021-09-18 NOTE — Assessment & Plan Note (Addendum)
-  replete °

## 2021-09-18 NOTE — Assessment & Plan Note (Addendum)
Presented with fever, leukopenia, tachypnea, and elevated lactate with respiratory failure Secondary to pneumonia Lactic acid peaked 2.4>>1.8 Follow-up PCT 0.17 Continue ceftriaxone and azithromycin Follow blood cultures--neg Sepsis physiology resolved

## 2021-09-18 NOTE — Assessment & Plan Note (Signed)
Resolved (2.4 > 1.8) ?

## 2021-09-19 ENCOUNTER — Inpatient Hospital Stay (HOSPITAL_COMMUNITY): Payer: Self-pay

## 2021-09-19 DIAGNOSIS — D696 Thrombocytopenia, unspecified: Secondary | ICD-10-CM

## 2021-09-19 DIAGNOSIS — R1310 Dysphagia, unspecified: Secondary | ICD-10-CM

## 2021-09-19 DIAGNOSIS — R0789 Other chest pain: Secondary | ICD-10-CM

## 2021-09-19 LAB — GLUCOSE, CAPILLARY
Glucose-Capillary: 167 mg/dL — ABNORMAL HIGH (ref 70–99)
Glucose-Capillary: 139 mg/dL — ABNORMAL HIGH (ref 70–99)
Glucose-Capillary: 140 mg/dL — ABNORMAL HIGH (ref 70–99)
Glucose-Capillary: 135 mg/dL — ABNORMAL HIGH (ref 70–99)

## 2021-09-19 LAB — CBC
HCT: 35.6 % — ABNORMAL LOW (ref 36.0–46.0)
Hemoglobin: 11.7 g/dL — ABNORMAL LOW (ref 12.0–15.0)
MCH: 31.5 pg (ref 26.0–34.0)
MCHC: 32.9 g/dL (ref 30.0–36.0)
MCV: 96 fL (ref 80.0–100.0)
Platelets: 43 10*3/uL — ABNORMAL LOW (ref 150–400)
RBC: 3.71 MIL/uL — ABNORMAL LOW (ref 3.87–5.11)
RDW: 16.3 % — ABNORMAL HIGH (ref 11.5–15.5)
WBC: 2.7 10*3/uL — ABNORMAL LOW (ref 4.0–10.5)
nRBC: 0 % (ref 0.0–0.2)

## 2021-09-19 LAB — COMPREHENSIVE METABOLIC PANEL
ALT: 25 U/L (ref 0–44)
AST: 54 U/L — ABNORMAL HIGH (ref 15–41)
Albumin: 2.7 g/dL — ABNORMAL LOW (ref 3.5–5.0)
Alkaline Phosphatase: 98 U/L (ref 38–126)
Anion gap: 6 (ref 5–15)
BUN: 9 mg/dL (ref 6–20)
CO2: 25 mmol/L (ref 22–32)
Calcium: 8.4 mg/dL — ABNORMAL LOW (ref 8.9–10.3)
Chloride: 108 mmol/L (ref 98–111)
Creatinine, Ser: 0.49 mg/dL (ref 0.44–1.00)
GFR, Estimated: >60 mL/min (ref >60)
Glucose, Bld: 149 mg/dL — ABNORMAL HIGH (ref 70–99)
Potassium: 3.3 mmol/L — ABNORMAL LOW (ref 3.5–5.1)
Sodium: 139 mmol/L (ref 135–145)
Total Bilirubin: 1.4 mg/dL — ABNORMAL HIGH (ref 0.3–1.2)
Total Protein: 5.7 g/dL — ABNORMAL LOW (ref 6.5–8.1)

## 2021-09-19 LAB — MAGNESIUM: Magnesium: 1.9 mg/dL (ref 1.7–2.4)

## 2021-09-19 MED ORDER — IPRATROPIUM-ALBUTEROL 0.5-2.5 (3) MG/3ML IN SOLN
3.0000 mL | Freq: Three times a day (TID) | RESPIRATORY_TRACT | Status: DC
  Administered 2021-09-19 – 2021-09-25 (×19): 3 mL via RESPIRATORY_TRACT
  Filled 2021-09-19 (×19): qty 3

## 2021-09-19 MED ORDER — POTASSIUM CHLORIDE 10 MEQ/100ML IV SOLN: 10.0000 meq | INTRAVENOUS | Status: DC

## 2021-09-19 MED ORDER — POTASSIUM CHLORIDE 10 MEQ/100ML IV SOLN
10.0000 meq | INTRAVENOUS | Status: DC
  Filled 2021-09-19: qty 100

## 2021-09-19 MED ORDER — FUROSEMIDE 10 MG/ML IJ SOLN
40.0000 mg | Freq: Once | INTRAMUSCULAR | Status: AC
  Administered 2021-09-19: 40 mg via INTRAVENOUS
  Filled 2021-09-19: qty 4

## 2021-09-19 MED ORDER — IPRATROPIUM-ALBUTEROL 0.5-2.5 (3) MG/3ML IN SOLN: 3.0000 mL | Freq: Three times a day (TID) | RESPIRATORY_TRACT | Status: DC

## 2021-09-19 MED ORDER — BUDESONIDE 0.5 MG/2ML IN SUSP
0.5000 mg | Freq: Two times a day (BID) | RESPIRATORY_TRACT | Status: DC
  Administered 2021-09-19 – 2021-09-25 (×12): 0.5 mg via RESPIRATORY_TRACT
  Filled 2021-09-19 (×12): qty 2

## 2021-09-19 MED ORDER — PANTOPRAZOLE SODIUM 40 MG IV SOLR
40.0000 mg | Freq: Two times a day (BID) | INTRAVENOUS | Status: DC
  Filled 2021-09-19: qty 10

## 2021-09-19 MED ORDER — IOHEXOL 350 MG/ML SOLN
75.0000 mL | Freq: Once | INTRAVENOUS | Status: AC | PRN
  Administered 2021-09-19: 75 mL via INTRAVENOUS

## 2021-09-19 MED ORDER — PANTOPRAZOLE SODIUM 40 MG IV SOLR
40.0000 mg | Freq: Two times a day (BID) | INTRAVENOUS | Status: DC
  Administered 2021-09-19 – 2021-09-25 (×11): 40 mg via INTRAVENOUS
  Filled 2021-09-19 (×11): qty 10

## 2021-09-19 MED ORDER — POTASSIUM CHLORIDE 10 MEQ/100ML IV SOLN
10.0000 meq | INTRAVENOUS | Status: AC
  Administered 2021-09-19 (×2): 10 meq via INTRAVENOUS
  Filled 2021-09-19: qty 100

## 2021-09-19 MED ORDER — SUCRALFATE 1 GM/10ML PO SUSP
1.0000 g | Freq: Three times a day (TID) | ORAL | Status: DC
  Administered 2021-09-19 – 2021-09-25 (×23): 1 g via ORAL
  Filled 2021-09-19 (×24): qty 10

## 2021-09-19 NOTE — Assessment & Plan Note (Addendum)
Pt states previously saw Dr. Jonette Eva 2013>>EGD with dil Now with liquids and solid food Consult GI appreciated 3/14 EGD--grade D esophagitis with bleeding protonix bid and sulcrafate ac/hs KOH stain negative for fungal process

## 2021-09-19 NOTE — Progress Notes (Signed)
Patient having productive cough, noted brown bloody sputum. MD Tat made aware.  ?

## 2021-09-19 NOTE — Consult Note (Signed)
Consulting  Provider: Dr. Arbutus Leas Primary Care Physician:  Benita Stabile, MD Primary Gastroenterologist: Previously Dr. Darrick Penna  Reason for Consultation: Odynophagia  HPI:  Candice Stanley is a 56 y.o. female with a past medical history of hypertension, GERD, diabetes, chronic leukopenia and thrombocytopenia, splenomegaly, obesity, who presented to [facility], ER yesterday evening with dyspnea and cough.  Found to be in acute respiratory failure due to pneumonia receiving antibiotics.  Patient states that yesterday she had new onset of odynophagia.  States it hurts for her to swallow any solid or liquid.  No regurgitation.  This is new for her.  Notes distant history of dysphagia proximately 10 years ago.  Chronically on PPI for GERD.  Does note recent steroid use.  No melena hematochezia.  No abdominal pain.  EGD by Dr. Darrick Penna 04/20/2012 showed H. pylori negative gastritis.  Esophageal dilation performed.  She had a CT performed of her chest which showed cirrhosis with minimal ascites.  History of fatty liver in the past.  Denies any chronic alcohol abuse.  Does note family history of cirrhosis in her mother due to NASH.  Hepatitis C antibody 04/09/2021 nonreactive.   Past Medical History:  Diagnosis Date   Anxiety    Depression    GERD (gastroesophageal reflux disease)    Hypercholesteremia    Hypertension    Hypothyroidism    Trimalleolar fracture of ankle, closed, right, sequela    Type 2 diabetes mellitus (HCC)     Past Surgical History:  Procedure Laterality Date   ABDOMINAL HYSTERECTOMY     CARDIAC CATHETERIZATION N/A 09/10/2015   Procedure: Left Heart Cath and Coronary Angiography;  Surgeon: Corky Crafts, MD;  Location: [facility] INVASIVE CV [facility];  Service: Cardiovascular;  Laterality: N/A;   CHOLECYSTECTOMY     OPEN REDUCTION INTERNAL FIXATION (ORIF) TIBIA/FIBULA FRACTURE Right 08/04/2016   Procedure: OPEN REDUCTION INTERNAL FIXATION (ORIF) RIGHT TRIMALLEOLAR FRACTURE;  Surgeon: Toni Arthurs, MD;  Location: [facility];  Service: Orthopedics;  Laterality: Right;   WRIST SURGERY      Prior to Admission medications   Medication Sig Start Date End Date Taking? Authorizing Provider  albuterol (VENTOLIN HFA) 108 (90 Base) MCG/ACT inhaler Inhale 1-2 puffs into the lungs every 6 (six) hours as needed for wheezing or shortness of breath. 06/28/21  Yes Wallis Bamberg, PA-C  ALPRAZolam Prudy Feeler) 0.5 MG tablet Take 0.25-0.5 mg by mouth daily as needed for anxiety.   Yes [provider]  amoxicillin-clavulanate (AUGMENTIN) 875-125 MG tablet Take 1 tablet by mouth every 12 (twelve) hours. 09/17/21  Yes Particia Nearing, PA-C  Aspirin-Acetaminophen-Caffeine (GOODY HEADACHE PO) Take 1 packet by mouth daily as needed (for pain/headache).   Yes [provider]  Cholecalciferol (VITAMIN D3 PO) Take 1 capsule by mouth daily.   Yes [provider]  ELDERBERRY PO Take 1 tablet by mouth daily.   Yes [provider]  famotidine (PEPCID) 20 MG tablet Take 20 mg by mouth at bedtime.   Yes [provider]  FLUoxetine (PROZAC) 40 MG capsule Take 40 mg by mouth daily. 08/15/19  Yes [provider]  loratadine (CLARITIN) 10 MG tablet Take 10 mg by mouth daily.   Yes [provider]  losartan (COZAAR) 25 MG tablet Take 25 mg by mouth daily. 04/03/21  Yes [provider]  metFORMIN (GLUCOPHAGE) 500 MG tablet Take by mouth 2 (two) times daily with a meal.   Yes [provider]  pantoprazole (PROTONIX) 40 MG  tablet Take 1 tablet (40 mg total) by mouth daily. 05/12/21  Yes Doreatha MassedKatragadda, Sreedhar, MD  vitamin C (ASCORBIC ACID) 500 MG tablet Take 500 mg by mouth daily.    Yes [provider]  zinc gluconate 50 MG tablet Take 50 mg by mouth daily.   Yes [provider]  azithromycin (ZITHROMAX Z-PAK) 250 MG tablet Take 1 tablet (250 mg total) by mouth daily. 500mg  PO day 1, then 250mg  PO days 205 Patient  not taking: Reported on 09/18/2021 09/17/21   Particia NearingLane, Rachel Elizabeth, PA-C  benzonatate (TESSALON) 100 MG capsule Take 1 capsule (100 mg total) by mouth every 8 (eight) hours. Patient not taking: Reported on 09/18/2021 10/10/20   Wurst, GrenadaBrittany, PA-C  dexlansoprazole (DEXILANT) 60 MG capsule Take 1 capsule (60 mg total) by mouth daily. Patient not taking: Reported on 09/18/2021 04/16/12   Joselyn ArrowJones, Kandice L, NP  fluticasone (FLOVENT HFA) 110 MCG/ACT inhaler Inhale 2 puffs into the lungs in the morning and at bedtime for 10 days. Patient not taking: Reported on 09/18/2021 07/08/21 07/18/21  Eber HongMiller, Brian, MD  HYDROcodone-acetaminophen (NORCO/VICODIN) 5-325 MG tablet Take 1 tablet by mouth every 6 (six) hours as needed. Patient not taking: Reported on 05/06/2021 07/11/20   Bethann BerkshireZammit, Joseph, MD  levocetirizine (XYZAL) 5 MG tablet Take 1 tablet (5 mg total) by mouth every evening. 06/28/21   Wallis BambergMani, Mario, PA-C  Multiple Vitamins-Minerals (ZINC PO) Take 1 tablet by mouth daily. Patient not taking: Reported on 09/18/2021    [provider]  predniSONE (DELTASONE) 50 MG tablet Take 1 tablet (50 mg total) by mouth daily with breakfast. 09/17/21   Particia NearingLane, Rachel Elizabeth, PA-C  promethazine-dextromethorphan (PROMETHAZINE-DM) 6.25-15 MG/5ML syrup Take 5 mLs by mouth 3 (three) times daily as needed for cough. 09/17/21   Particia NearingLane, Rachel Elizabeth, PA-C  traZODone (DESYREL) 100 MG tablet Take 100 mg by mouth at bedtime. Patient not taking: Reported on 09/18/2021    [provider]    Current Facility-Administered Medications  Medication Dose Route Frequency Provider Last Rate Last Admin   acetaminophen (TYLENOL) tablet 650 mg  650 mg Oral Q6H PRN Adefeso, Oladapo, DO   650 mg at 09/18/21 2221   ALPRAZolam (XANAX) tablet 0.25 mg  0.25 mg Oral QID PRN Catarina Hartshornat, David, MD   0.25 mg at 09/18/21 2046   alum & mag hydroxide-simeth (MAALOX/MYLANTA) 200-200-20 MG/5ML suspension 30 mL  30 mL Oral Q4H PRN Tat, Onalee Huaavid, MD   30 mL  at 09/18/21 1243   ascorbic acid (VITAMIN C) tablet 500 mg  500 mg Oral BID Tat, Onalee Huaavid, MD   500 mg at 09/19/21 0904   azithromycin (ZITHROMAX) 500 mg in sodium chloride 0.9 % 250 mL IVPB  500 mg Intravenous Q24H Adefeso, Oladapo, DO 250 mL/hr at 09/18/21 2231 500 mg at 09/18/21 2231   calcium carbonate (TUMS - dosed in mg elemental calcium) chewable tablet 400 mg of elemental calcium  2 tablet Oral BID PRN Tat, Onalee Huaavid, MD   400 mg of elemental calcium at 09/18/21 1542   cefTRIAXone (ROCEPHIN) 2 g in sodium chloride 0.9 % 100 mL IVPB  2 g Intravenous Q24H Adefeso, Oladapo, DO 200 mL/hr at 09/18/21 2056 2 g at 09/18/21 2056   dextromethorphan-guaiFENesin (MUCINEX DM) 30-600 MG per 12 hr tablet 1 tablet  1 tablet Oral BID Adefeso, Oladapo, DO   1 tablet at 09/18/21 2043   FLUoxetine (PROZAC) capsule 40 mg  40 mg Oral Daily Adefeso, Oladapo, DO   40 mg at 09/19/21  9323   guaiFENesin-dextromethorphan (ROBITUSSIN DM) 100-10 MG/5ML syrup 5 mL  5 mL Oral Q4H PRN Adefeso, Oladapo, DO   5 mL at 09/19/21 1051   insulin aspart (novoLOG) injection 0-15 Units  0-15 Units Subcutaneous TID WC Adefeso, Oladapo, DO   3 Units at 09/19/21 0904   insulin aspart (novoLOG) injection 0-5 Units  0-5 Units Subcutaneous QHS Adefeso, Oladapo, DO       ipratropium-albuterol (DUONEB) 0.5-2.5 (3) MG/3ML nebulizer solution 3 mL  3 mL Nebulization Q4H PRN Adefeso, Oladapo, DO   3 mL at 09/19/21 0944   loratadine (CLARITIN) tablet 10 mg  10 mg Oral Daily Tat, Onalee Hua, MD   10 mg at 09/19/21 0904   losartan (COZAAR) tablet 25 mg  25 mg Oral Daily Adefeso, Oladapo, DO   25 mg at 09/19/21 5573   magic mouthwash w/lidocaine  10 mL Oral QID Tat, Onalee Hua, MD   10 mL at 09/19/21 2202   menthol-cetylpyridinium (CEPACOL) lozenge 3 mg  1 lozenge Oral PRN Tat, Onalee Hua, MD   3 mg at 09/18/21 1025   pantoprazole (PROTONIX) EC tablet 40 mg  40 mg Oral Daily Adefeso, Oladapo, DO   40 mg at 09/19/21 5427   pneumococcal 20-valent conjugate vaccine (PREVNAR  20) injection 0.5 mL  0.5 mL Intramuscular Tomorrow-1000 Adefeso, Oladapo, DO       potassium chloride 10 mEq in 100 mL IVPB  10 mEq Intravenous Q1 Hr x 2 Tat, David, MD       zinc sulfate capsule 220 mg  220 mg Oral Daily Tat, David, MD   220 mg at 09/19/21 0903    Allergies as of 09/17/2021 - Review Complete 09/17/2021  Allergen Reaction Noted   Codeine Nausea And Vomiting 11/03/2009   Dilaudid [hydromorphone hcl]  03/28/2013   Tape Swelling and Other (See Comments) 02/21/2015   Vicodin [hydrocodone-acetaminophen] Nausea And Vomiting 03/05/2012   Latex Rash 12/06/2016    Family History  Problem Relation Age of Onset   Coronary artery disease Father    Heart disease Father    Barrett's esophagus Father    Hypertension Mother    Coronary artery disease Paternal Uncle    Coronary artery disease Cousin     Social History   Socioeconomic History   Marital status: Married    Spouse name: Not on file   Number of children: 2   Years of education: Not on file   Highest education level: Not on file  Occupational History   Occupation: property mgmt    Employer: [employer]  Tobacco Use   Smoking status: Former    Types: Cigarettes    Quit date: 05/20/1991    Years since quitting: 30.3   Smokeless tobacco: Never   Tobacco comments:    Used to smoke a pack in 1-2 weeks/ 1992  Vaping Use   Vaping Use: Never used  Substance and Sexual Activity   Alcohol use: Not Currently    Alcohol/week: 0.0 standard drinks    Comment: social   Drug use: No   Sexual activity: Yes    Birth control/protection: Surgical  Other Topics Concern   Not on file  Social History Narrative   Not on file   Social Determinants of Health   Financial Resource Strain: Not on file  Food Insecurity: Not on file  Transportation Needs: Not on file  Physical Activity: Not on file  Stress: Not on file  Social Connections: Not on file  Intimate Partner Violence: Not on file  Review of  Systems: General: Negative for anorexia, weight loss, fever, chills, fatigue, weakness. Eyes: Negative for vision changes.  ENT: Negative for hoarseness, difficulty swallowing , nasal congestion. CV: Negative for chest pain, angina, palpitations, dyspnea on exertion, peripheral edema.  Respiratory: Negative for dyspnea at rest, dyspnea on exertion, cough, sputum, wheezing.  GI: See history of present illness. GU:  Negative for dysuria, hematuria, urinary incontinence, urinary frequency, nocturnal urination.  MS: Negative for joint pain, low back pain.  Derm: Negative for rash or itching.  Neuro: Negative for weakness, abnormal sensation, seizure, frequent headaches, memory loss, confusion.  Psych: Negative for anxiety, depression Endo: Negative for unusual weight change.  Heme: Negative for bruising or bleeding. Allergy: Negative for rash or hives.  Physical Exam: Vital signs in last 24 hours: Temp:  [98 F (36.7 C)-98.6 F (37 C)] 98.2 F (36.8 C) (03/12 0525) Pulse Rate:  [67-72] 67 (03/12 0525) Resp:  [17-18] 18 (03/12 0525) BP: (123-148)/(49-74) 135/74 (03/12 0525) SpO2:  [96 %-98 %] 97 % (03/12 0944) Last BM Date : 09/17/21 General:   Alert,  Well-developed, well-nourished, pleasant and cooperative in NAD Head:  Normocephalic and atraumatic. Eyes:  Sclera clear, no icterus.   Conjunctiva pink. Ears:  Normal auditory acuity. Nose:  No deformity, discharge,  or lesions. Mouth:  No deformity or lesions, dentition normal. Neck:  Supple; no masses or thyromegaly. Lungs:  Diffuse wheezing Heart:  Regular rate and rhythm; no murmurs, clicks, rubs,  or gallops. Abdomen:  Soft, nontender and nondistended. No masses, hepatosplenomegaly or hernias noted. Normal bowel sounds, without guarding, and without rebound.   Msk:  Symmetrical without gross deformities. Normal posture. Pulses:  Normal pulses noted. Extremities:  Without clubbing or edema. Neurologic:  Alert and  oriented x4;   grossly normal neurologically. Skin:  Intact without significant lesions or rashes. Cervical Nodes:  No significant cervical adenopathy. Psych:  Alert and cooperative. Normal mood and affect.  Intake/Output from previous day: 03/11 0701 - 03/12 0700 In: 360 [P.O.:360] Out: -  Intake/Output this shift: No intake/output data recorded.  Lab Results: Recent Labs    09/17/21 1835 09/18/21 0443 09/19/21 0503  WBC 2.7* 2.4* 2.7*  HGB 11.3* 11.0* 11.7*  HCT 35.0* 33.9* 35.6*  PLT 49* 46* 43*   BMET Recent Labs    09/17/21 1835 09/18/21 0443 09/19/21 0503  NA 136 136 139  K 3.4* 3.4* 3.3*  CL 105 106 108  CO2 23 21* 25  GLUCOSE 170* 222* 149*  BUN CREATININE 0.58 0.55 0.49  CALCIUM 8.7* 8.2* 8.4*   LFT Recent Labs    09/18/21 0443 09/19/21 0503  PROT 6.0* 5.7*  ALBUMIN 2.8* 2.7*  AST 49* 54*  ALT 24 25  ALKPHOS 103 98  BILITOT 2.0* 1.4*   PT/INR No results for input(s): LABPROT, INR in the last 72 hours. Hepatitis Panel No results for input(s): HEPBSAG, HCVAB, HEPAIGM, HEPBIGM in the last 72 hours. C-Diff No results for input(s): CDIFFTOX in the last 72 hours.  Studies/Results: CT Angio Chest Pulmonary Embolism (PE) W or WO Contrast  Result Date: 09/19/2021 CLINICAL DATA:  Pleuritic chest pain, dyspnea, and hemoptysis. Hypoxia. Clinical suspicion for pulmonary embolism. EXAM: CT ANGIOGRAPHY CHEST WITH CONTRAST TECHNIQUE: Multidetector CT imaging of the chest was performed using the standard protocol during bolus administration of intravenous contrast. Multiplanar CT image reconstructions and MIPs were obtained to evaluate the vascular anatomy. RADIATION DOSE REDUCTION: This exam was performed according to the departmental dose-optimization  program which includes automated exposure control, adjustment of the mA and/or kV according to patient size and/or use of iterative reconstruction technique. CONTRAST:  15mL OMNIPAQUE IOHEXOL 350 MG/ML SOLN COMPARISON:   07/08/2021 FINDINGS: Cardiovascular: Satisfactory opacification of pulmonary arteries noted, and no pulmonary emboli identified. No evidence of thoracic aortic dissection or aneurysm. Mediastinum/Nodes: No masses or pathologically enlarged lymph nodes identified. Lungs/Pleura: New multifocal areas of ground-glass opacity and airspace consolidation are seen throughout both lungs, which may be due to infection, inflammatory process, or hemorrhage. No evidence of parenchymal cavitation. No evidence of pleural effusion. Upper abdomen: Hepatic cirrhosis is noted with minimal ascites. Recanalization of paraumbilical veins and splenomegaly are consistent with pulmonary venous hypertension. 2.7 cm low-attenuation left adrenal mass is stable compared to earlier study on 07/11/2020, consistent with benign adrenal adenoma. Musculoskeletal: No suspicious bone lesions identified. Review of the MIP images confirms the above findings. IMPRESSION: No evidence of pulmonary embolism. New widespread areas of ground-glass opacity and airspace consolidation throughout both lungs. Differential diagnosis includes infection, inflammatory process, hemorrhage, or atypical edema. Hepatic cirrhosis and findings of pulmonary venous hypertension. Stable benign left adrenal adenoma. Electronically Signed   By: Danae Orleans M.D.   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  She will likely need this further evaluated with upper endoscopy.  We will hold off for now until she improves from acute respiratory failure/pneumonia standpoint.  Tentatively plan on EGD in 1 to 2 days pending clinical course.  I will change her PPI to IV Protonix 40 mg twice daily.  I will add on liquid Carafate 4 times daily.  Discussed her cirrhosis in depth with her today, likely  due to NASH.  Mother also with cirrhosis due to NASH.  Denies any alcohol history.  Risk factors include diabetes and obesity.  She will need to follow-up with GI clinic for cirrhosis management.  Thank you for the consultation, GI to continue to follow.  Hennie Duos. Marletta Lor, D.O. Gastroenterology and Hepatology [facility]    LOS: 1 day     09/19/2021, 11:53 AM

## 2021-09-19 NOTE — Assessment & Plan Note (Addendum)
troponins 12>>12 Personally reviewed EKG>>sinus, nonspecific T wave change Primarily GI etiology Echo

## 2021-09-19 NOTE — Progress Notes (Signed)
PROGRESS NOTE  Candice Stanley EAV:[id number] DOB: [birth year] DOA: 09/17/2021 PCP: Benita Stabile, MD  Brief History:  56 year old female with a history of anxiety/depression, hypertension, hyperlipidemia, diabetes mellitus type 2, thrombocytopenia and leukopenia presenting with 2-day history of generalized weakness, worsening shortness of breath and cough with brown sputum.  The patient states that she has been coughing to the point of having some posttussive emesis.  She denies any fevers, chills, chest pain, headache, neck pain, nausea, vomiting, diarrhea, abdominal pain.  She did have a small amount of blood-tinged sputum associated with an incessant cough.  She denied any worsening lower extremity edema orthopnea type symptoms.  She denies any anginal type symptoms or worsening shortness of breath with activity.  She went to urgent care on the morning of admission, she was given a prescription for Augmentin, azithromycin, and prednisone.  She states that she had 1 dose of the medications without improvement.  As result she presented for further evaluation.  She has remote history of tobacco only smoking a few cigarettes daily for 5 years, quitting in 1992.. In the ED, the patient was afebrile and hemodynamically stable with oxygen saturation 88% on room air.  BMP showed sodium 136, potassium 3.4, bicarbonate 23, BUN 10, creatinine 0.58.  WBC 2.7, hemoglobin 11.3, platelets 49,000.  The patient was started on ceftriaxone and azithromycin.  3/11-3/12--developed solid and liquid odynophagia;  small volume hemoptysis mixed with sputum.  Sob slowly improving.  No vomiting, diarrhea.  EKG and troponins not suggestive ACS.  GI consulted     Assessment and Plan: * Severe sepsis (HCC) Presented with fever, leukopenia, tachypnea, and elevated lactate with respiratory failure Secondary to pneumonia Lactic acid peaked 2.4>> Follow-up PCT 0.17 Continue ceftriaxone and azithromycin Follow blood  cultures  Lobar pneumonia (HCC) Personally reviewed chest x-ray--bibasilar infiltrates, increased interstitial markings --3/12 CTA chest--no PE;  Widespread GGO and airspace consolidation Continue ceftriaxone and azithromycin Urine Legionella antigen Urine Streptococcus pneumoniae antigen Follow-up PCT--0.17 --give lasix IV x 1 Start duoneb  Acute respiratory failure with hypoxia (HCC) Presented with oxygen saturation 88% on room air with tachypnea Stable on 2 L nasal cannula Wean oxygen as tolerated for saturation greater 92%  Cirrhosis of liver with ascites (HCC) 07/08/2021 CTA chest suggested cirrhotic liver changes on imaging with ascites 09/19/21 CTA chest>>hepatic cirrhosis with minimal ascites Patient has signs of portal hypertension and splenomegaly Suspect she has underlying NASH  Odynophagia Pt states previously saw Dr. Jonette Eva 2013>>EGD with dil Now with liquids and solid food Consult GI She does not want to downgrade diet  Magic mouthwash with lidocaine prn  Chronic leukopenia Secondary to splenomegaly and underlying liver cirrhosis as discussed above Follows Dr. Noralyn Pick on 04/21/2021 showed slightly hypercellular bone marrow with trilineage hematopoiesis.  Slight lymphocytosis of primarily small lymphoid cells with lack of significant aggregates.  Chromosome analysis is normal.  Thrombocytopenia (HCC) Secondary to splenomegaly and underlying liver cirrhosis Has been progressive since 2018 Follows Dr. Ellin Saba [facility] on 04/21/2021 showed slightly hypercellular bone marrow with trilineage hematopoiesis.  Slight lymphocytosis of primarily small lymphoid cells with lack of significant aggregates.  Chromosome analysis is normal. Monitor for signs of bleeding  Hypokalemia replete  Hypomagnesemia replete  Obesity, Class III, BMI 40-49.9 (morbid obesity) (HCC) BMI 47.44 Lifestyle modification  Essential hypertension Continue losartan  Depression  with anxiety Continue Prozac PDMP reviewed--last alprazolam Rx 11/25/20  Uncontrolled type 2 diabetes mellitus with hyperglycemia, without long-term current  use of insulin (HCC) Holding metformin NovoLog sliding scale Check hemoglobin A1c  Atypical chest pain troponins 12>>12 Personally reviewed EKG>>sinus, nonspecific T wave change Primarily GI etiology  GERD (gastroesophageal reflux disease) Continue Protonix    Status is: Inpatient Remains inpatient appropriate because: severity of illness requiring oxygen and IV antibiotics       Family Communication:   no Family at bedside   Consultants:  none   Code Status:  FULL    DVT Prophylaxis:  SCDs     Procedures: As Listed in Progress Note Above   Antibiotics: Ceftriaxone 3/10>> Azithro 3/10>>           Subjective: Patient complains of cough with small volume hemoptysis.  Sob a little better.  Has solid and liquid odynophagia.  No nv/d, abd pain. No f/c  Objective: Vitals:   09/18/21 1312 09/18/21 2057 09/19/21 0525 09/19/21 0944  BP: (!) 148/67 (!) 123/49 135/74   Pulse: 69 72 67   Resp: Temp: 98 F (36.7 C) 98.6 F (37 C) 98.2 F (36.8 C)   TempSrc: Oral Oral    SpO2: 98% 96% 97% 97%  Weight:      Height:        Intake/Output Summary (Last 24 hours) at 09/19/2021 1200 Last data filed at 09/18/2021 1820 Gross per 24 hour  Intake 360 ml  Output --  Net 360 ml   Weight change:  Exam:  General:  Pt is alert, follows commands appropriately, not in acute distress HEENT: No icterus, No thrush, No neck mass, /AT Cardiovascular: RRR, S1/S2, no rubs, no gallops Respiratory: bilateral rales.  Bibasilar wheeze Abdomen: Soft/+BS, non tender, non distended, no guarding Extremities: trace LE edema, No lymphangitis, No petechiae, No rashes, no synovitis   Data Reviewed: I have personally reviewed following labs and imaging studies Basic Metabolic Panel: Recent Labs  Lab 09/17/21 1835  09/18/21 0443 09/19/21 0503  NA 136 136 139  K 3.4* 3.4* 3.3*  CL 105 106 108  CO2 23 21* 25  GLUCOSE 170* 222* 149*  BUN CREATININE 0.58 0.55 0.49  CALCIUM 8.7* 8.2* 8.4*  MG  --  1.6* 1.9  PHOS  --  2.5  --    Liver Function Tests: Recent Labs  Lab 09/18/21 0443 09/19/21 0503  AST 49* 54*  ALT 24 25  ALKPHOS 103 98  BILITOT 2.0* 1.4*  PROT 6.0* 5.7*  ALBUMIN 2.8* 2.7*   No results for input(s): LIPASE, AMYLASE in the last 168 hours. No results for input(s): AMMONIA in the last 168 hours. Coagulation Profile: No results for input(s): INR, PROTIME in the last 168 hours. CBC: Recent Labs  Lab 09/17/21 1835 09/18/21 0443 09/19/21 0503  WBC 2.7* 2.4* 2.7*  NEUTROABS 2.0  --   --   HGB 11.3* 11.0* 11.7*  HCT 35.0* 33.9* 35.6*  MCV 94.3 96.3 96.0  PLT 49* 46* 43*   Cardiac Enzymes: No results for input(s): CKTOTAL, CKMB, CKMBINDEX, TROPONINI in the last 168 hours. BNP: Invalid input(s): POCBNP CBG: Recent Labs  Lab 09/18/21 1112 09/18/21 1624 09/18/21 2058 09/19/21 0747 09/19/21 1132  GLUCAP 105* 153* 143* 167* 139*   HbA1C: No results for input(s): HGBA1C in the last 72 hours. Urine analysis:    Component Value Date/Time   COLORURINE YELLOW 10/16/2018 2123   APPEARANCEUR CLEAR 10/16/2018 2123   LABSPEC 1.009 10/16/2018 2123   PHURINE 5.0 10/16/2018 2123   GLUCOSEU NEGATIVE 10/16/2018 2123  HGBUR NEGATIVE 10/16/2018 2123   BILIRUBINUR NEGATIVE 10/16/2018 2123   KETONESUR NEGATIVE 10/16/2018 2123   PROTEINUR NEGATIVE 10/16/2018 2123   UROBILINOGEN 0.2 08/26/2013 0500   NITRITE NEGATIVE 10/16/2018 2123   LEUKOCYTESUR NEGATIVE 10/16/2018 2123   Sepsis Labs: @LABRCNTIP (procalcitonin:4,lacticidven:4) ) Recent Results (from the past 240 hour(s))  Resp Panel by RT-PCR (Flu A&B, Covid) Nasopharyngeal Swab     Status: None   Collection Time: 09/17/21  7:15 PM   Specimen: Nasopharyngeal Swab; Nasopharyngeal(NP) swabs in vial transport medium   Result Value Ref Range Status   SARS Coronavirus 2 by RT PCR NEGATIVE NEGATIVE Final    Comment: (NOTE) SARS-CoV-2 target nucleic acids are NOT DETECTED.  The SARS-CoV-2 RNA is generally detectable in upper respiratory specimens during the acute phase of infection. The lowest concentration of SARS-CoV-2 viral copies this assay can detect is 138 copies/mL. A negative result does not preclude SARS-Cov-2 infection and should not be used as the sole basis for treatment or other patient management decisions. A negative result may occur with  improper specimen collection/handling, submission of specimen other than nasopharyngeal swab, presence of viral mutation(s) within the areas targeted by this assay, and inadequate number of viral copies(<138 copies/mL). A negative result must be combined with clinical observations, patient history, and epidemiological information. The expected result is Negative.  Fact Sheet for Patients:  11/17/21  Fact Sheet for Healthcare Providers:  BloggerCourse.com  This test is no t yet approved or cleared by the [employer] FDA and  has been authorized for detection and/or diagnosis of SARS-CoV-2 by FDA under an Emergency Use Authorization (EUA). This EUA will remain  in effect (meaning this test can be used) for the duration of the COVID-19 declaration under Section 564(b)(1) of the Act, 21 U.S.C.section 360bbb-3(b)(1), unless the authorization is terminated  or revoked sooner.       Influenza A by PCR NEGATIVE NEGATIVE Final   Influenza B by PCR NEGATIVE NEGATIVE Final    Comment: (NOTE) The Xpert Xpress SARS-CoV-2/FLU/RSV plus assay is intended as an aid in the diagnosis of influenza from Nasopharyngeal swab specimens and should not be used as a sole basis for treatment. Nasal washings and aspirates are unacceptable for Xpert Xpress SARS-CoV-2/FLU/RSV testing.  Fact Sheet for  Patients: [place]  Fact Sheet for Healthcare Providers: BloggerCourse.com  This test is not yet approved or cleared by the [employer] FDA and has been authorized for detection and/or diagnosis of SARS-CoV-2 by FDA under an Emergency Use Authorization (EUA). This EUA will remain in effect (meaning this test can be used) for the duration of the COVID-19 declaration under Section 564(b)(1) of the Act, 21 U.S.C. section 360bbb-3(b)(1), unless the authorization is terminated or revoked.  Performed at [facility], [address], [place], [place]   Culture, blood (routine x 2)     Status: None (Preliminary result)   Collection Time: 09/17/21  7:15 PM   Specimen: Right Antecubital; Blood  Result Value Ref Range Status   Specimen Description RIGHT ANTECUBITAL  Final   Special Requests   Final    BOTTLES DRAWN AEROBIC AND ANAEROBIC Blood Culture results may not be optimal due to an excessive volume of blood received in culture bottles   Culture   Final    NO GROWTH 2 DAYS Performed at [facility], [address] [place], [place]    Report Status PENDING  Incomplete  Culture, blood (routine x 2)     Status: None (Preliminary result)  Collection Time: 09/17/21  7:22 PM   Specimen: BLOOD RIGHT HAND  Result Value Ref Range Status   Specimen Description BLOOD RIGHT HAND  Final   Special Requests   Final    BOTTLES DRAWN AEROBIC AND ANAEROBIC Blood Culture results may not be optimal due to an excessive volume of blood received in culture bottles   Culture   Final    NO GROWTH 2 DAYS Performed at [facility], [address] [place], [place] [address]    Report Status PENDING  Incomplete  Expectorated Sputum Assessment w Gram Stain, Rflx to Resp Cult     Status: None   Collection Time: 09/18/21  3:02 AM   Specimen: Sputum  Result Value Ref Range Status   Specimen Description SPUTUM  Final   Special  Requests NONE  Final   Sputum evaluation   Final    Sputum specimen not acceptable for testing.  Please recollect.   SPOKE TO TORI RN @ 0800 ON [id number] BY HENDERSON L CORRECTED ON 03/11 AT 9417: PREVIOUSLY REPORTED AS THIS SPECIMEN IS ACCEPTABLE FOR SPUTUM CULTURE Performed at [facility], [address], [place], [place] [address]    Report Status 09/18/2021 FINAL  Final     Scheduled Meds:  vitamin C  500 mg Oral BID   dextromethorphan-guaiFENesin  1 tablet Oral BID   FLUoxetine  40 mg Oral Daily   furosemide  40 mg Intravenous Once   insulin aspart  0-15 Units Subcutaneous TID WC   insulin aspart  0-5 Units Subcutaneous QHS   loratadine  10 mg Oral Daily   losartan  25 mg Oral Daily   magic mouthwash w/lidocaine  10 mL Oral QID   pantoprazole  40 mg Oral Daily   pneumococcal 20-valent conjugate vaccine  0.5 mL Intramuscular Tomorrow-1000   zinc sulfate  220 mg Oral Daily   Continuous Infusions:  azithromycin 500 mg (09/18/21 2231)   cefTRIAXone (ROCEPHIN)  IV 2 g (09/18/21 2056)   potassium chloride      Procedures/Studies: DG Chest 2 View  Result Date: 09/17/2021 CLINICAL DATA:  Progressively work is thinning shortness of breath with weakness. EXAM: CHEST - 2 VIEW COMPARISON:  07/08/2021 FINDINGS: Indistinct airspace type densities at the bases. No Kerley lines, effusion, or pneumothorax. Mild cardiomegaly. Stable mediastinal and hilar contours including hilar prominence which is likely vascular based on 07/08/2021 CT IMPRESSION: Infiltrates at both lung bases. Followup PA and lateral chest X-ray is recommended in 3-4 weeks following trial of antibiotic therapy to ensure resolution. Electronically Signed   By: Tiburcio Pea M.D.   On: 09/17/2021 09:24   CT Angio Chest Pulmonary Embolism (PE) W or WO Contrast  Result Date: 09/19/2021 CLINICAL DATA:  Pleuritic chest pain, dyspnea, and hemoptysis. Hypoxia. Clinical suspicion for pulmonary embolism. EXAM: CT ANGIOGRAPHY CHEST WITH  CONTRAST TECHNIQUE: Multidetector CT imaging of the chest was performed using the standard protocol during bolus administration of intravenous contrast. Multiplanar CT image reconstructions and MIPs were obtained to evaluate the vascular anatomy. RADIATION DOSE REDUCTION: This exam was performed according to the departmental dose-optimization program which includes automated exposure control, adjustment of the mA and/or kV according to patient size and/or use of iterative reconstruction technique. CONTRAST:  54mL OMNIPAQUE IOHEXOL 350 MG/ML SOLN COMPARISON:  07/08/2021 FINDINGS: Cardiovascular: Satisfactory opacification of pulmonary arteries noted, and no pulmonary emboli identified. No evidence of thoracic aortic dissection or aneurysm. Mediastinum/Nodes: No masses or pathologically enlarged lymph nodes identified. Lungs/Pleura: New multifocal areas of ground-glass  opacity and airspace consolidation are seen throughout both lungs, which may be due to infection, inflammatory process, or hemorrhage. No evidence of parenchymal cavitation. No evidence of pleural effusion. Upper abdomen: Hepatic cirrhosis is noted with minimal ascites. Recanalization of paraumbilical veins and splenomegaly are consistent with pulmonary venous hypertension. 2.7 cm low-attenuation left adrenal mass is stable compared to earlier study on 07/11/2020, consistent with benign adrenal adenoma. Musculoskeletal: No suspicious bone lesions identified. Review of the MIP images confirms the above findings. IMPRESSION: No evidence of pulmonary embolism. New widespread areas of ground-glass opacity and airspace consolidation throughout both lungs. Differential diagnosis includes infection, inflammatory process, hemorrhage, or atypical edema. Hepatic cirrhosis and findings of pulmonary venous hypertension. Stable benign left adrenal adenoma. Electronically Signed   By: Danae OrleansJohn A Stahl M.D.   On: 09/19/2021 10:58    Catarina Hartshornavid Dustyn Armbrister, DO  [facility]  If 7PM-7AM, please contact night-coverage [email or web address] Password [email or web address] 09/19/2021, 12:00 PM   LOS: 1 day

## 2021-09-20 ENCOUNTER — Inpatient Hospital Stay (HOSPITAL_COMMUNITY): Payer: Self-pay

## 2021-09-20 ENCOUNTER — Other Ambulatory Visit (HOSPITAL_COMMUNITY): Payer: Self-pay | Admitting: *Deleted

## 2021-09-20 ENCOUNTER — Encounter (HOSPITAL_COMMUNITY): Payer: Self-pay | Admitting: Radiology

## 2021-09-20 DIAGNOSIS — I5031 Acute diastolic (congestive) heart failure: Secondary | ICD-10-CM

## 2021-09-20 LAB — MAGNESIUM: Magnesium: 1.8 mg/dL (ref 1.7–2.4)

## 2021-09-20 LAB — CBC
HCT: 32 % — ABNORMAL LOW (ref 36.0–46.0)
Hemoglobin: 9.9 g/dL — ABNORMAL LOW (ref 12.0–15.0)
MCH: 29.6 pg (ref 26.0–34.0)
MCHC: 30.9 g/dL (ref 30.0–36.0)
MCV: 95.8 fL (ref 80.0–100.0)
Platelets: 44 10*3/uL — ABNORMAL LOW (ref 150–400)
RBC: 3.34 MIL/uL — ABNORMAL LOW (ref 3.87–5.11)
RDW: 16 % — ABNORMAL HIGH (ref 11.5–15.5)
WBC: 2.7 10*3/uL — ABNORMAL LOW (ref 4.0–10.5)
nRBC: 0 % (ref 0.0–0.2)

## 2021-09-20 LAB — GLUCOSE, CAPILLARY
Glucose-Capillary: 134 mg/dL — ABNORMAL HIGH (ref 70–99)
Glucose-Capillary: 138 mg/dL — ABNORMAL HIGH (ref 70–99)
Glucose-Capillary: 99 mg/dL (ref 70–99)
Glucose-Capillary: 123 mg/dL — ABNORMAL HIGH (ref 70–99)

## 2021-09-20 LAB — BASIC METABOLIC PANEL
Anion gap: 8 (ref 5–15)
BUN: 7 mg/dL (ref 6–20)
CO2: 25 mmol/L (ref 22–32)
Calcium: 8.4 mg/dL — ABNORMAL LOW (ref 8.9–10.3)
Chloride: 104 mmol/L (ref 98–111)
Creatinine, Ser: 0.55 mg/dL (ref 0.44–1.00)
GFR, Estimated: >60 mL/min (ref >60)
Glucose, Bld: 195 mg/dL — ABNORMAL HIGH (ref 70–99)
Potassium: 3 mmol/L — ABNORMAL LOW (ref 3.5–5.1)
Sodium: 137 mmol/L (ref 135–145)

## 2021-09-20 LAB — ECHOCARDIOGRAM COMPLETE
AR max vel: 2.43 cm2
AV Area VTI: 2.13 cm2
AV Area mean vel: 2.31 cm2
AV Mean grad: 11 mmHg
AV Peak grad: 22.8 mmHg
Ao pk vel: 2.39 m/s
Area-P 1/2: 2.34 cm2
Height: 64 in
S' Lateral: 3.1 cm
Weight: 4422.4 oz

## 2021-09-20 LAB — PROTIME-INR
INR: 1.3 — ABNORMAL HIGH (ref 0.8–1.2)
Prothrombin Time: 16.6 seconds — ABNORMAL HIGH (ref 11.4–15.2)

## 2021-09-20 LAB — HEPATIC FUNCTION PANEL
ALT: 24 U/L (ref 0–44)
AST: 52 U/L — ABNORMAL HIGH (ref 15–41)
Albumin: 2.8 g/dL — ABNORMAL LOW (ref 3.5–5.0)
Alkaline Phosphatase: 102 U/L (ref 38–126)
Bilirubin, Direct: 0.5 mg/dL — ABNORMAL HIGH (ref 0.0–0.2)
Indirect Bilirubin: 1.1 mg/dL — ABNORMAL HIGH (ref 0.3–0.9)
Total Bilirubin: 1.6 mg/dL — ABNORMAL HIGH (ref 0.3–1.2)
Total Protein: 5.9 g/dL — ABNORMAL LOW (ref 6.5–8.1)

## 2021-09-20 LAB — LIPASE, BLOOD: Lipase: 39 U/L (ref 11–51)

## 2021-09-20 LAB — HEMOGLOBIN A1C
Hgb A1c MFr Bld: 6 % — ABNORMAL HIGH (ref 4.8–5.6)
Mean Plasma Glucose: 126 mg/dL

## 2021-09-20 LAB — STREP PNEUMONIAE URINARY ANTIGEN: Strep Pneumo Urinary Antigen: NEGATIVE

## 2021-09-20 LAB — PROCALCITONIN: Procalcitonin: 0.16 ng/mL

## 2021-09-20 MED ORDER — ALBUTEROL SULFATE (2.5 MG/3ML) 0.083% IN NEBU
INHALATION_SOLUTION | RESPIRATORY_TRACT | Status: AC
  Administered 2021-09-20: 2.5 mg
  Filled 2021-09-20: qty 3

## 2021-09-20 MED ORDER — POLYETHYLENE GLYCOL 3350 17 G PO PACK
17.0000 g | PACK | Freq: Every day | ORAL | Status: DC
  Administered 2021-09-23 – 2021-09-24 (×2): 17 g via ORAL
  Filled 2021-09-20 (×3): qty 1

## 2021-09-20 MED ORDER — POTASSIUM CHLORIDE 10 MEQ/100ML IV SOLN
10.0000 meq | INTRAVENOUS | Status: AC
  Administered 2021-09-20 (×3): 10 meq via INTRAVENOUS
  Filled 2021-09-20 (×3): qty 100

## 2021-09-20 MED ORDER — HYOSCYAMINE SULFATE 0.125 MG SL SUBL
0.2500 mg | SUBLINGUAL_TABLET | Freq: Once | SUBLINGUAL | Status: AC
  Administered 2021-09-20: 0.25 mg via SUBLINGUAL
  Filled 2021-09-20: qty 2

## 2021-09-20 MED ORDER — POTASSIUM CHLORIDE CRYS ER 20 MEQ PO TBCR
20.0000 meq | EXTENDED_RELEASE_TABLET | Freq: Once | ORAL | Status: AC
  Administered 2021-09-20: 20 meq via ORAL
  Filled 2021-09-20: qty 1

## 2021-09-20 MED ORDER — LIDOCAINE VISCOUS HCL 2 % MT SOLN
15.0000 mL | Freq: Once | OROMUCOSAL | Status: AC
  Administered 2021-09-20: 15 mL via ORAL
  Filled 2021-09-20: qty 15

## 2021-09-20 MED ORDER — OXYCODONE-ACETAMINOPHEN 5-325 MG PO TABS
1.0000 | ORAL_TABLET | Freq: Once | ORAL | Status: AC
  Administered 2021-09-20: 1 via ORAL
  Filled 2021-09-20: qty 1

## 2021-09-20 MED ORDER — ALUM & MAG HYDROXIDE-SIMETH 200-200-20 MG/5ML PO SUSP
30.0000 mL | Freq: Once | ORAL | Status: AC
  Administered 2021-09-20: 30 mL via ORAL
  Filled 2021-09-20: qty 30

## 2021-09-20 MED ORDER — IOHEXOL 300 MG/ML  SOLN
100.0000 mL | Freq: Once | INTRAMUSCULAR | Status: AC | PRN
  Administered 2021-09-20: 100 mL via INTRAVENOUS

## 2021-09-20 MED ORDER — GLYCERIN (LAXATIVE) 2 G RE SUPP
1.0000 | Freq: Once | RECTAL | Status: AC
  Administered 2021-09-20: 1 via RECTAL
  Filled 2021-09-20: qty 1

## 2021-09-20 MED ORDER — FUROSEMIDE 10 MG/ML IJ SOLN
40.0000 mg | Freq: Once | INTRAMUSCULAR | Status: AC
  Administered 2021-09-20: 40 mg via INTRAVENOUS
  Filled 2021-09-20: qty 4

## 2021-09-20 NOTE — Progress Notes (Signed)
PROGRESS NOTE  Candice Stanley:[id number] DOB: Dec 06, 1965 DOA: 09/17/2021 PCP: Benita Stabile, MD  Brief History:  56 year old female with a history of anxiety/depression, hypertension, hyperlipidemia, diabetes mellitus type 2, thrombocytopenia and leukopenia presenting with 2-day history of generalized weakness, worsening shortness of breath and cough with brown sputum.  The patient states that she has been coughing to the point of having some posttussive emesis.  She denies any fevers, chills, chest pain, headache, neck pain, nausea, vomiting, diarrhea, abdominal pain.  She did have a small amount of blood-tinged sputum associated with an incessant cough.  She denied any worsening lower extremity edema orthopnea type symptoms.  She denies any anginal type symptoms or worsening shortness of breath with activity.  She went to urgent care on the morning of admission, she was given a prescription for Augmentin, azithromycin, and prednisone.  She states that she had 1 dose of the medications without improvement.  As result she presented for further evaluation.  She has remote history of tobacco only smoking a few cigarettes daily for 5 years, quitting in 1992.. In the ED, the patient was afebrile and hemodynamically stable with oxygen saturation 88% on room air.  BMP showed sodium 136, potassium 3.4, bicarbonate 23, BUN 10, creatinine 0.58.  WBC 2.7, hemoglobin 11.3, platelets 49,000.  The patient was started on ceftriaxone and azithromycin.  3/11-3/12--developed solid and liquid odynophagia;  small volume hemoptysis mixed with sputum.  Sob slowly improving.  No vomiting, diarrhea.  EKG and troponins not suggestive ACS.  GI consulted  3/13--continues to have odynophagia and epigastric pain with solid and liquids.  No n/v/d.  CT abd/pelvis ordered.  Planning EGD 3/14.  Weaning off oxygen.  CTA chest neg for PE    Assessment and Plan: * Severe sepsis (HCC) Presented with fever, leukopenia,  tachypnea, and elevated lactate with respiratory failure Secondary to pneumonia Lactic acid peaked 2.4>>1.8 Follow-up PCT 0.17 Continue ceftriaxone and azithromycin Follow blood cultures--neg Sepsis physiology resolved  Lobar pneumonia (HCC) Personally reviewed chest x-ray--bibasilar infiltrates, increased interstitial markings --3/12 CTA chest--no PE;  Widespread GGO and airspace consolidation Continue ceftriaxone and azithromycin Urine Legionella antigen Urine Streptococcus pneumoniae antigen Follow-up PCT--0.17 --give lasix IV x 1 Start duoneb  Acute respiratory failure with hypoxia (HCC) Presented with oxygen saturation 88% on room air with tachypnea Due to pneumonia and pulm edema Stable on 2 L nasal cannula Wean oxygen as tolerated for saturation greater 92% 3/12 CTA chest multifocal areas of GGO and consolidation; no PE 3/12 and 3/13>>lasix IV Echo  Cirrhosis of liver with ascites (HCC) 07/08/2021 CTA chest suggested cirrhotic liver changes on imaging with ascites 09/19/21 CTA chest>>hepatic cirrhosis with minimal ascites Patient has signs of portal hypertension and splenomegaly Suspect she has underlying NASH  Odynophagia Pt states previously saw Dr. Jonette Eva 2013>>EGD with dil Now with liquids and solid food Consult GI appreciated She does not want to downgrade diet  Magic mouthwash with lidocaine prn CT abd/pelvis  Chronic leukopenia Secondary to splenomegaly and underlying liver cirrhosis as discussed above Follows Dr. Noralyn Pick on 04/21/2021 showed slightly hypercellular bone marrow with trilineage hematopoiesis.  Slight lymphocytosis of primarily small lymphoid cells with lack of significant aggregates.  Chromosome analysis is normal.  Thrombocytopenia (HCC) Secondary to splenomegaly and underlying liver cirrhosis Has been progressive since 2018 Follows Dr. Ellin Saba [facility] on 04/21/2021 showed slightly hypercellular bone marrow with trilineage  hematopoiesis.  Slight lymphocytosis of primarily small lymphoid cells with  lack of significant aggregates.  Chromosome analysis is normal. Monitor for signs of bleeding  Hypokalemia replete  Hypomagnesemia replete  Obesity, Class III, BMI 40-49.9 (morbid obesity) (HCC) BMI 47.44 Lifestyle modification  Essential hypertension Continue losartan  Depression with anxiety Continue Prozac PDMP reviewed--last alprazolam Rx 11/25/20  Uncontrolled type 2 diabetes mellitus with hyperglycemia, without long-term current use of insulin (HCC) Holding metformin NovoLog sliding scale 09/17/21 hemoglobin A1c--6.0  Atypical chest pain troponins 12>>12 Personally reviewed EKG>>sinus, nonspecific T wave change Primarily GI etiology Echo  GERD (gastroesophageal reflux disease) Continue Protonix         Status is: Inpatient Remains inpatient appropriate because: severity of illness.  Intolerance to diet requiring inpatient GI work up    Family Communication:   spouse updated  3/13  Consultants:  GI  Code Status:  FULL   DVT Prophylaxis:  SCDs   Procedures: As Listed in Progress Note Above  Antibiotics: Ceftriaxone 3/10>> Azithro 3/10>>       Subjective: Continues to have odynophagia and epigastric pain.  Denies f/c, cp, n/v/d.  Sob is improving.  Denies hematochezia or melena  Objective: Vitals:   09/20/21 0610 09/20/21 0726 09/20/21 1359 09/20/21 1428  BP: 140/66   (!) 141/56  Pulse: 77   80  Resp: 18     Temp: 98.4 F (36.9 C)   98.2 F (36.8 C)  TempSrc: Oral   Oral  SpO2: 96% 97% 97% 92%  Weight:      Height:        Intake/Output Summary (Last 24 hours) at 09/20/2021 1433 Last data filed at 09/20/2021 1100 Gross per 24 hour  Intake 845.24 ml  Output 150 ml  Net 695.24 ml   Weight change:  Exam:  General:  Pt is alert, follows commands appropriately, not in acute distress HEENT: No icterus, No thrush, No neck mass, [place]/AT Cardiovascular: RRR,  S1/S2, no rubs, no gallops Respiratory: bibasilar rales. No wheez Abdomen: Soft/+BS, non tender, non distended, no guarding Extremities: trace LE edema, No lymphangitis, No petechiae, No rashes, no synovitis   Data Reviewed: I have personally reviewed following labs and imaging studies Basic Metabolic Panel: Recent Labs  Lab 09/17/21 1835 09/18/21 0443 09/19/21 0503 09/20/21 0435  NA 136 136 139 137  K 3.4* 3.4* 3.3* 3.0*  CL 105 106 108 104  CO2 23 21* 25 25  GLUCOSE 170* 222* 149* 195*  BUN 10 10 9 7   CREATININE 0.58 0.55 0.49 0.55  CALCIUM 8.7* 8.2* 8.4* 8.4*  MG  --  1.6* 1.9 1.8  PHOS  --  2.5  --   --    Liver Function Tests: Recent Labs  Lab 09/18/21 0443 09/19/21 0503  AST 49* 54*  ALT 24 25  ALKPHOS 103 98  BILITOT 2.0* 1.4*  PROT 6.0* 5.7*  ALBUMIN 2.8* 2.7*   No results for input(s): LIPASE, AMYLASE in the last 168 hours. No results for input(s): AMMONIA in the last 168 hours. Coagulation Profile: No results for input(s): INR, PROTIME in the last 168 hours. CBC: Recent Labs  Lab 09/17/21 1835 09/18/21 0443 09/19/21 0503 09/20/21 0435  WBC 2.7* 2.4* 2.7* 2.7*  NEUTROABS 2.0  --   --   --   HGB 11.3* 11.0* 11.7* 9.9*  HCT 35.0* 33.9* 35.6* 32.0*  MCV 94.3 96.3 96.0 95.8  PLT 49* 46* 43* 44*   Cardiac Enzymes: No results for input(s): CKTOTAL, CKMB, CKMBINDEX, TROPONINI in the last 168 hours. BNP: Invalid input(s): POCBNP CBG:  Recent Labs  Lab 09/19/21 1132 09/19/21 1638 09/19/21 2107 09/20/21 0739 09/20/21 1122  GLUCAP 139* 140* 135* 134* 138*   HbA1C: Recent Labs    09/17/21 1835  HGBA1C 6.0*   Urine analysis:    Component Value Date/Time   COLORURINE YELLOW 10/16/2018 2123   APPEARANCEUR CLEAR 10/16/2018 2123   LABSPEC 1.009 10/16/2018 2123   PHURINE 5.0 10/16/2018 2123   GLUCOSEU NEGATIVE 10/16/2018 2123   HGBUR NEGATIVE 10/16/2018 2123   BILIRUBINUR NEGATIVE 10/16/2018 2123   KETONESUR NEGATIVE 10/16/2018 2123    PROTEINUR NEGATIVE 10/16/2018 2123   UROBILINOGEN 0.2 08/26/2013 0500   NITRITE NEGATIVE 10/16/2018 2123   LEUKOCYTESUR NEGATIVE 10/16/2018 2123   Sepsis Labs: @LABRCNTIP (procalcitonin:4,lacticidven:4) ) Recent Results (from the past 240 hour(s))  Resp Panel by RT-PCR (Flu A&B, Covid) Nasopharyngeal Swab     Status: None   Collection Time: 09/17/21  7:15 PM   Specimen: Nasopharyngeal Swab; Nasopharyngeal(NP) swabs in vial transport medium  Result Value Ref Range Status   SARS Coronavirus 2 by RT PCR NEGATIVE NEGATIVE Final    Comment: (NOTE) SARS-CoV-2 target nucleic acids are NOT DETECTED.  The SARS-CoV-2 RNA is generally detectable in upper respiratory specimens during the acute phase of infection. The lowest concentration of SARS-CoV-2 viral copies this assay can detect is 138 copies/mL. A negative result does not preclude SARS-Cov-2 infection and should not be used as the sole basis for treatment or other patient management decisions. A negative result may occur with  improper specimen collection/handling, submission of specimen other than nasopharyngeal swab, presence of viral mutation(s) within the areas targeted by this assay, and inadequate number of viral copies(<138 copies/mL). A negative result must be combined with clinical observations, patient history, and epidemiological information. The expected result is Negative.  Fact Sheet for Patients:  11/17/21  Fact Sheet for Healthcare Providers:  BloggerCourse.com  This test is no t yet approved or cleared by the [employer] FDA and  has been authorized for detection and/or diagnosis of SARS-CoV-2 by FDA under an Emergency Use Authorization (EUA). This EUA will remain  in effect (meaning this test can be used) for the duration of the COVID-19 declaration under Section 564(b)(1) of the Act, 21 U.S.C.section 360bbb-3(b)(1), unless the authorization is terminated   or revoked sooner.       Influenza A by PCR NEGATIVE NEGATIVE Final   Influenza B by PCR NEGATIVE NEGATIVE Final    Comment: (NOTE) The Xpert Xpress SARS-CoV-2/FLU/RSV plus assay is intended as an aid in the diagnosis of influenza from Nasopharyngeal swab specimens and should not be used as a sole basis for treatment. Nasal washings and aspirates are unacceptable for Xpert Xpress SARS-CoV-2/FLU/RSV testing.  Fact Sheet for Patients: [place]  Fact Sheet for Healthcare Providers: BloggerCourse.com  This test is not yet approved or cleared by the [employer] FDA and has been authorized for detection and/or diagnosis of SARS-CoV-2 by FDA under an Emergency Use Authorization (EUA). This EUA will remain in effect (meaning this test can be used) for the duration of the COVID-19 declaration under Section 564(b)(1) of the Act, 21 U.S.C. section 360bbb-3(b)(1), unless the authorization is terminated or revoked.  Performed at [facility], [address] [place], [place]   Culture, blood (routine x 2)     Status: None (Preliminary result)   Collection Time: 09/17/21  7:15 PM   Specimen: Right Antecubital; Blood  Result Value Ref Range Status   Specimen Description RIGHT ANTECUBITAL  Final   Special Requests  Final    BOTTLES DRAWN AEROBIC AND ANAEROBIC Blood Culture results may not be optimal due to an excessive volume of blood received in culture bottles   Culture   Final    NO GROWTH 3 DAYS Performed at [facility], [address], [place], [place] [address]    Report Status PENDING  Incomplete  Culture, blood (routine x 2)     Status: None (Preliminary result)   Collection Time: 09/17/21  7:22 PM   Specimen: BLOOD RIGHT HAND  Result Value Ref Range Status   Specimen Description BLOOD RIGHT HAND  Final   Special Requests   Final    BOTTLES DRAWN AEROBIC AND ANAEROBIC Blood Culture results may not be optimal  due to an excessive volume of blood received in culture bottles   Culture   Final    NO GROWTH 3 DAYS Performed at [facility], [address] [place], [place] [address]    Report Status PENDING  Incomplete  Expectorated Sputum Assessment w Gram Stain, Rflx to Resp Cult     Status: None   Collection Time: 09/18/21  3:02 AM   Specimen: Sputum  Result Value Ref Range Status   Specimen Description SPUTUM  Final   Special Requests NONE  Final   Sputum evaluation   Final    Sputum specimen not acceptable for testing.  Please recollect.   SPOKE TO TORI RN @ 0800 ON [id number] BY HENDERSON L CORRECTED ON 03/11 AT 1308: PREVIOUSLY REPORTED AS THIS SPECIMEN IS ACCEPTABLE FOR SPUTUM CULTURE Performed at [facility], [address] [place], [place] [address]    Report Status 09/18/2021 FINAL  Final     Scheduled Meds:  vitamin C  500 mg Oral BID   budesonide (PULMICORT) nebulizer solution  0.5 mg Nebulization BID   dextromethorphan-guaiFENesin  1 tablet Oral BID   FLUoxetine  40 mg Oral Daily   furosemide  40 mg Intravenous Once   Glycerin (Adult)  1 suppository Rectal Once   insulin aspart  0-15 Units Subcutaneous TID WC   insulin aspart  0-5 Units Subcutaneous QHS   ipratropium-albuterol  3 mL Nebulization TID   loratadine  10 mg Oral Daily   losartan  25 mg Oral Daily   magic mouthwash w/lidocaine  10 mL Oral QID   pantoprazole (PROTONIX) IV  40 mg Intravenous Q12H   pneumococcal 20-valent conjugate vaccine  0.5 mL Intramuscular Tomorrow-1000   potassium chloride  20 mEq Oral Once   sucralfate  1 g Oral TID WC & HS   zinc sulfate  220 mg Oral Daily   Continuous Infusions:  azithromycin 500 mg (09/19/21 2126)   cefTRIAXone (ROCEPHIN)  IV 2 g (09/19/21 2232)   potassium chloride      Procedures/Studies: DG Chest 2 View  Result Date: 09/17/2021 CLINICAL DATA:  Progressively work is thinning shortness of breath with weakness. EXAM: CHEST - 2 VIEW COMPARISON:  07/08/2021 FINDINGS:  Indistinct airspace type densities at the bases. No Kerley lines, effusion, or pneumothorax. Mild cardiomegaly. Stable mediastinal and hilar contours including hilar prominence which is likely vascular based on 07/08/2021 CT IMPRESSION: Infiltrates at both lung bases. Followup PA and lateral chest X-ray is recommended in 3-4 weeks following trial of antibiotic therapy to ensure resolution. Electronically Signed   By: Tiburcio Pea M.D.   On: 09/17/2021 09:24   CT Angio Chest Pulmonary Embolism (PE) W or WO Contrast  Result Date: 09/19/2021 CLINICAL DATA:  Pleuritic chest pain, dyspnea, and hemoptysis.  Hypoxia. Clinical suspicion for pulmonary embolism. EXAM: CT ANGIOGRAPHY CHEST WITH CONTRAST TECHNIQUE: Multidetector CT imaging of the chest was performed using the standard protocol during bolus administration of intravenous contrast. Multiplanar CT image reconstructions and MIPs were obtained to evaluate the vascular anatomy. RADIATION DOSE REDUCTION: This exam was performed according to the departmental dose-optimization program which includes automated exposure control, adjustment of the mA and/or kV according to patient size and/or use of iterative reconstruction technique. CONTRAST:  75mL OMNIPAQUE IOHEXOL 350 MG/ML SOLN COMPARISON:  07/08/2021 FINDINGS: Cardiovascular: Satisfactory opacification of pulmonary arteries noted, and no pulmonary emboli identified. No evidence of thoracic aortic dissection or aneurysm. Mediastinum/Nodes: No masses or pathologically enlarged lymph nodes identified. Lungs/Pleura: New multifocal areas of ground-glass opacity and airspace consolidation are seen throughout both lungs, which may be due to infection, inflammatory process, or hemorrhage. No evidence of parenchymal cavitation. No evidence of pleural effusion. Upper abdomen: Hepatic cirrhosis is noted with minimal ascites. Recanalization of paraumbilical veins and splenomegaly are consistent with pulmonary venous  hypertension. 2.7 cm low-attenuation left adrenal mass is stable compared to earlier study on 07/11/2020, consistent with benign adrenal adenoma. Musculoskeletal: No suspicious bone lesions identified. Review of the MIP images confirms the above findings. IMPRESSION: No evidence of pulmonary embolism. New widespread areas of ground-glass opacity and airspace consolidation throughout both lungs. Differential diagnosis includes infection, inflammatory process, hemorrhage, or atypical edema. Hepatic cirrhosis and findings of pulmonary venous hypertension. Stable benign left adrenal adenoma. Electronically Signed   By: Danae OrleansJohn A Stahl M.D.   On: 09/19/2021 10:58    Catarina Hartshornavid Fiza Nation, DO  [facility]  If 7PM-7AM, please contact night-coverage [email or web address] Password [email or web address] 09/20/2021, 2:33 PM   LOS: 2 days

## 2021-09-20 NOTE — Progress Notes (Cosign Needed Addendum)
Gastroenterology Progress Note   Referring Provider: No ref. provider found Primary Care Physician:  Benita StabileHall, John Z, MD Primary Gastroenterologist: previously Dr. Darrick PennaFields  Patient ID: Candice Stanley; [id number]; [birth year]    Subjective   Patient and daughter at bedside.  Patient stated that she has not been feeling well today.  Daughter at bedside stated that she just arrived at the hospital leaving her sick baby at home, because the patient called her crying in pain.  Patient and daughter state that their nurse has had a bad attitude and has been very rude to the patient. Patient reports that her pain has been intermittently very sharp and cramping to the epigastric region.  She states that she is hungry however she feels that she cannot eat because she is in too much pain.  She had about 3 bites of eggs and a little bit of toast this morning and very little liquids.  She describes her pain as starting around her stomach and the pain radiates up into her esophagus.  She reports not getting any relief from the Carafate, and the only other pain medication that she has has been Tylenol which is also not been helpful.  She does report having her Protonix increased to twice daily yesterday, but also feels like this is not helping.  She states that belching is not helpful either.  She denies any overt redness or white coating in her mouth, does not feel like this is thrush. She does report some constipation and feels like she has not had bowel movement since she has been here, she takes Fleet suppositories at home which are helpful.  She reports that she had pneumonia prior to this hospitalization that was only on one lung, she was seen in urgent care and was given steroids and an antibiotic, and received a chest x-ray.  She had further issues breathing so she presented to the ED, and has been on antibiotics since.   Objective   Vital signs in last 24 hours Temp:  [98.4 F (36.9 C)-98.9 F (37.2 C)]  98.4 F (36.9 C) (03/13 0610) Pulse Rate:  [77-92] 77 (03/13 0610) Resp:  [17-18] 18 (03/13 0610) BP: (140-143)/(52-66) 140/66 (03/13 0610) SpO2:  [93 %-97 %] 97 % (03/13 0726) Last BM Date : 09/17/21  Physical Exam General:   Alert and oriented, ill appearing Head:  Normocephalic and atraumatic. Eyes:  No icterus, sclera clear. Conjuctiva pink.  Mouth:  Dry, Without lesions, mucosa pink and moist.  Neck:  Supple, without thyromegaly or masses.  Heart:  S1, S2 present, no murmurs noted.  Lungs: Diminished to auscultation bilaterally, without wheezing, rales, or rhonchi.  Abdomen:  Bowel sounds present, soft, non-distended, mild TTP in epigastric region. No HSM or hernias noted. No rebound or guarding. No masses appreciated  Msk:  Symmetrical without gross deformities. Normal posture. Neurologic:  Alert and  oriented x4;  grossly normal neurologically. Skin:  Warm and dry, intact without significant lesions.  Psych:  Alert and cooperative. Emotional/Upset  Intake/Output from previous day: 03/12 0701 - 03/13 0700 In: 965.2 [P.O.:360; IV Piggyback:605.2] Out: -  Intake/Output this shift: Total I/O In: 240 [P.O.:240] Out: 150 [Urine:150]  Lab Results  Recent Labs    09/18/21 0443 09/19/21 0503 09/20/21 0435  WBC 2.4* 2.7* 2.7*  HGB 11.0* 11.7* 9.9*  HCT 33.9* 35.6* 32.0*  PLT 46* 43* 44*   BMET Recent Labs    09/18/21 0443 09/19/21 0503 09/20/21 0435  NA 136 139 137  K 3.4* 3.3* 3.0*  CL 106 108 104  CO2 21* 25 25  GLUCOSE 222* 149* 195*  BUN 10 9 7   CREATININE 0.55 0.49 0.55  CALCIUM 8.2* 8.4* 8.4*   LFT Recent Labs    09/18/21 0443 09/19/21 0503  PROT 6.0* 5.7*  ALBUMIN 2.8* 2.7*  AST 49* 54*  ALT 24 25  ALKPHOS 103 98  BILITOT 2.0* 1.4*   PT/INR No results for input(s): LABPROT, INR in the last 72 hours. Hepatitis Panel No results for input(s): HEPBSAG, HCVAB, HEPAIGM, HEPBIGM in the last 72 hours.   Studies/Results DG Chest 2 View  Result  Date: 09/17/2021 CLINICAL DATA:  Progressively work is thinning shortness of breath with weakness. EXAM: CHEST - 2 VIEW COMPARISON:  07/08/2021 FINDINGS: Indistinct airspace type densities at the bases. No Kerley lines, effusion, or pneumothorax. Mild cardiomegaly. Stable mediastinal and hilar contours including hilar prominence which is likely vascular based on 07/08/2021 CT IMPRESSION: Infiltrates at both lung bases. Followup PA and lateral chest X-ray is recommended in 3-4 weeks following trial of antibiotic therapy to ensure resolution. Electronically Signed   By: 07/10/2021 M.D.   On: 09/17/2021 09:24   CT Angio Chest Pulmonary Embolism (PE) W or WO Contrast  Result Date: 09/19/2021 CLINICAL DATA:  Pleuritic chest pain, dyspnea, and hemoptysis. Hypoxia. Clinical suspicion for pulmonary embolism. EXAM: CT ANGIOGRAPHY CHEST WITH CONTRAST TECHNIQUE: Multidetector CT imaging of the chest was performed using the standard protocol during bolus administration of intravenous contrast. Multiplanar CT image reconstructions and MIPs were obtained to evaluate the vascular anatomy. RADIATION DOSE REDUCTION: This exam was performed according to the departmental dose-optimization program which includes automated exposure control, adjustment of the mA and/or kV according to patient size and/or use of iterative reconstruction technique. CONTRAST:  3mL OMNIPAQUE IOHEXOL 350 MG/ML SOLN COMPARISON:  07/08/2021 FINDINGS: Cardiovascular: Satisfactory opacification of pulmonary arteries noted, and no pulmonary emboli identified. No evidence of thoracic aortic dissection or aneurysm. Mediastinum/Nodes: No masses or pathologically enlarged lymph nodes identified. Lungs/Pleura: New multifocal areas of ground-glass opacity and airspace consolidation are seen throughout both lungs, which may be due to infection, inflammatory process, or hemorrhage. No evidence of parenchymal cavitation. No evidence of pleural effusion. Upper  abdomen: Hepatic cirrhosis is noted with minimal ascites. Recanalization of paraumbilical veins and splenomegaly are consistent with pulmonary venous hypertension. 2.7 cm low-attenuation left adrenal mass is stable compared to earlier study on 07/11/2020, consistent with benign adrenal adenoma. Musculoskeletal: No suspicious bone lesions identified. Review of the MIP images confirms the above findings. IMPRESSION: No evidence of pulmonary embolism. New widespread areas of ground-glass opacity and airspace consolidation throughout both lungs. Differential diagnosis includes infection, inflammatory process, hemorrhage, or atypical edema. Hepatic cirrhosis and findings of pulmonary venous hypertension. Stable benign left adrenal adenoma. Electronically Signed   By: 09/08/2020 M.D.   On: 09/19/2021 10:58    Assessment  56 y.o. female with a history of HTN, GERD, diabetes, chronic leukopenia and thrombocytopenia, splenomegaly, and obesity who presented to the ED with dyspnea and cough, and was found to be in acute respiratory failure due to pneumonia.  GI was consulted for patient complaint of odynophagia.   Odynophagia/dysphagia/Epigastric pain: Chronically on daily PPI for GERD. Last EGD in October 2013 by Dr. November 2013 showed nonerosive gastritis, normal duodenum, esophageal dilation performed.  Has not had any issues with swallowing since then, until now. She presented with pain with swallowing Saturday, she believed it to be possibly from Mucinex as this was  her only new medication that she has not taken before.  Pain described as sharp cramping that radiates up into her esophagus and has a burning feeling.  She denies nausea, vomiting, or food regurgitation. She reports that when she eats she sometimes also has a burning feeling as food or liquids go down with associated abdominal pain.  Potassium 3 today, was 3.4 on admission, recommend replacement with goal > 3.5 for patient to be able to undergo procedure.  Etiology is unclear at this time, pending patient's respiratory status and electrolyte status she will be n.p.o. at midnight for possible EGD tomorrow.  Other than cirrhosis found on CT chest, patient has not had any dedicated abdominal imaging recently and given her symptoms we will obtain CT A/P with contrast and check lipase for full work-up of abdominal pain.  Lipase 39. We will continue PPI twice daily and will give GI cocktail.   Decompensated cirrhosis: AST slightly elevated to 49, ALT 24, bilirubin 2 on admission. 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  Patient and daughter stated that she has not had GI follow-up outpatient for her cirrhosis yet due to her recent respiratory illnesses. CTA of her chest showed evidence of portal hypertension with splenomegaly, and cirrhosis with minimal ascites.  Hep C antibody from September 2022 nonreactive.  Will need follow-up outpatient for cirrhosis management, continues to remain clinically intact.  Today bilirubin 1.6, INR 1.3, AST 52, ALT 24.  Constipation: Reports she is unclear when her last bowel movement was.  She reports some common constipation at home for which she uses Fleet suppositories for relief.  Due to associated abdominal pain, will give her a glycerin suppository today and add daily miralax while inpatient to see if this helps.   Plan / Recommendations  GI cocktail with Levsin Continue IV pantoprazole 40 mg twice daily CT A/P with contrast HFP, Lipase, and PT-INR Glycerin suppository once N.p.o. at midnight for possible EGD tomorrow Can continue Carafate and Maalox as needed    LOS: 2 days    09/20/2021, 1:53 PM   Brooke Bonito, MSN, FNP-BC, AGACNP-BC [facility]

## 2021-09-20 NOTE — Progress Notes (Signed)
*  PRELIMINARY RESULTS* ?Echocardiogram ?2D Echocardiogram has been performed. ? ?Stacey Drain ?09/20/2021, 3:38 PM ?

## 2021-09-20 NOTE — TOC Initial Note (Signed)
Transition of Care (TOC) - Initial/Assessment Note  ? ? ?Patient Details  ?Name: Candice Stanley ?MRN: [id number] ?Date of Birth: Apr 16, 1966 ? ?Transition of Care (TOC) CM/SW Contact:    ?Elliot Gault, LCSW ?Phone Number: ?09/20/2021, 12:38 PM ? ?Clinical Narrative:                 ? ?Pt admitted from home. She is independent in ADLs at home and plan is for return to home at dc. Pt does not currently have insurance on file. She does appear to have a PCP. MD anticipating dc Wednesday. TOC will follow and assist if needs arise. ? ?Expected Discharge Plan: Home/Self Care ?Barriers to Discharge: Continued Medical Work up ? ? ?Patient Goals and CMS Choice ?Patient states their goals for this hospitalization and ongoing recovery are:: go home ?  ?  ? ?Expected Discharge Plan and Services ?Expected Discharge Plan: Home/Self Care ?  ?  ?  ?Living arrangements for the past 2 months: Single Family Home ?                ?  ?  ?  ?  ?  ?  ?  ?  ?  ?  ? ?Prior Living Arrangements/Services ?Living arrangements for the past 2 months: Single Family Home ?Lives with:: Spouse ?Patient language and need for interpreter reviewed:: Yes ?       ?Need for Family Participation in Patient Care: No (Comment) ?Care giver support system in place?: Yes (comment) ?  ?Criminal Activity/Legal Involvement Pertinent to Current Situation/Hospitalization: No - Comment as needed ? ?Activities of Daily Living ?Home Assistive Devices/Equipment: None ?ADL Screening (condition at time of admission) ?Patient's cognitive ability adequate to safely complete daily activities?: Yes ?Is the patient deaf or have difficulty hearing?: No ?Does the patient have difficulty seeing, even when wearing glasses/contacts?: No ?Does the patient have difficulty concentrating, remembering, or making decisions?: No ?Patient able to express need for assistance with ADLs?: Yes ?Does the patient have difficulty dressing or bathing?: No ?Independently performs ADLs?: Yes  (appropriate for developmental age) ?Does the patient have difficulty walking or climbing stairs?: No ?Weakness of Legs: None ?Weakness of Arms/Hands: None ? ?Permission Sought/Granted ?  ?  ?   ?   ?   ?   ? ?Emotional Assessment ?  ?  ?  ?Orientation: : Oriented to Self, Oriented to Place, Oriented to  Time, Oriented to Situation ?Alcohol / Substance Use: Not Applicable ?Psych Involvement: No (comment) ? ?Admission diagnosis:  CAP (community acquired pneumonia) [J18.9] ?Community acquired pneumonia, unspecified laterality [J18.9] ?Patient Active Problem List  ? Diagnosis Date Noted  ? Acute respiratory failure with hypoxia (HCC) 09/18/2021  ? Essential hypertension 09/18/2021  ? Obesity, Class III, BMI 40-49.9 (morbid obesity) (HCC) 09/18/2021  ? Thrombocytopenia (HCC) 09/18/2021  ? Lobar pneumonia (HCC) 09/18/2021  ? Cirrhosis of liver with ascites (HCC) 09/18/2021  ? Severe sepsis (HCC) 09/18/2021  ? Hypomagnesemia 09/18/2021  ? Hypothyroidism 06/05/2018  ? Uncontrolled type 2 diabetes mellitus with hyperglycemia, without long-term current use of insulin (HCC) 06/05/2018  ? Hypophosphatemia 06/05/2018  ? Depression with anxiety 06/05/2018  ? Chronic leukopenia 06/05/2018  ? Hypokalemia 06/05/2018  ? Chronic cough 12/06/2016  ? Precordial pain   ? Family history of early CAD   ? Atypical chest pain 09/02/2015  ? Dysphagia 04/16/2012  ? Odynophagia 04/16/2012  ? Unspecified hypothyroidism 11/03/2009  ? Hyperlipidemia 11/03/2009  ? DYSTHYMIC DISORDER 11/03/2009  ? GERD (gastroesophageal reflux disease) 11/03/2009  ?  Diaphragmatic hernia 11/03/2009  ? CHEST PAIN UNSPECIFIED 11/03/2009  ? ?PCP:  Benita Stabile, MD ?Pharmacy:   ?[id number] - [place], [place] - [address] ?[address] ?[place] [address] ?Phone: [phone] Fax: [phone] ? ? ? ? ?Social Determinants of Health (SDOH) Interventions ?  ? ?Readmission Risk Interventions ?Readmission Risk Prevention Plan 09/20/2021   ?Transportation Screening Complete  ?HRI or Home Care Consult Complete  ?Palliative Care Screening Not Applicable  ?Medication Review Oceanographer) Complete  ?Some recent data might be hidden  ? ? ? ?

## 2021-09-21 ENCOUNTER — Inpatient Hospital Stay (HOSPITAL_COMMUNITY): Payer: Self-pay | Admitting: Anesthesiology

## 2021-09-21 ENCOUNTER — Encounter (HOSPITAL_COMMUNITY): Payer: Self-pay | Admitting: Internal Medicine

## 2021-09-21 ENCOUNTER — Encounter (HOSPITAL_COMMUNITY)
Admission: EM | Disposition: A | Discharge status: Home / Self Care | Payer: Self-pay | Source: Home / Self Care | Attending: Internal Medicine

## 2021-09-21 DIAGNOSIS — K766 Portal hypertension: Secondary | ICD-10-CM

## 2021-09-21 DIAGNOSIS — K3189 Other diseases of stomach and duodenum: Secondary | ICD-10-CM

## 2021-09-21 DIAGNOSIS — K2970 Gastritis, unspecified, without bleeding: Secondary | ICD-10-CM

## 2021-09-21 DIAGNOSIS — I10 Essential (primary) hypertension: Secondary | ICD-10-CM

## 2021-09-21 DIAGNOSIS — J810 Acute pulmonary edema: Secondary | ICD-10-CM | POA: Insufficient documentation

## 2021-09-21 DIAGNOSIS — J811 Chronic pulmonary edema: Secondary | ICD-10-CM

## 2021-09-21 HISTORY — PX: ESOPHAGEAL BRUSHING: SHX6842

## 2021-09-21 LAB — BASIC METABOLIC PANEL
Anion gap: 7 (ref 5–15)
BUN: 8 mg/dL (ref 6–20)
CO2: 27 mmol/L (ref 22–32)
Calcium: 8.6 mg/dL — ABNORMAL LOW (ref 8.9–10.3)
Chloride: 105 mmol/L (ref 98–111)
Creatinine, Ser: 0.49 mg/dL (ref 0.44–1.00)
GFR, Estimated: >60 mL/min (ref >60)
Glucose, Bld: 134 mg/dL — ABNORMAL HIGH (ref 70–99)
Potassium: 4.1 mmol/L (ref 3.5–5.1)
Sodium: 139 mmol/L (ref 135–145)

## 2021-09-21 LAB — CBC
HCT: 32.6 % — ABNORMAL LOW (ref 36.0–46.0)
Hemoglobin: 10.2 g/dL — ABNORMAL LOW (ref 12.0–15.0)
MCH: 30.4 pg (ref 26.0–34.0)
MCHC: 31.3 g/dL (ref 30.0–36.0)
MCV: 97.3 fL (ref 80.0–100.0)
Platelets: 48 10*3/uL — ABNORMAL LOW (ref 150–400)
RBC: 3.35 MIL/uL — ABNORMAL LOW (ref 3.87–5.11)
RDW: 16 % — ABNORMAL HIGH (ref 11.5–15.5)
WBC: 2.3 10*3/uL — ABNORMAL LOW (ref 4.0–10.5)
nRBC: 0 % (ref 0.0–0.2)

## 2021-09-21 LAB — LEGIONELLA PNEUMOPHILA SEROGP 1 UR AG: L. pneumophila Serogp 1 Ur Ag: NEGATIVE

## 2021-09-21 LAB — GLUCOSE, CAPILLARY
Glucose-Capillary: 122 mg/dL — ABNORMAL HIGH (ref 70–99)
Glucose-Capillary: 109 mg/dL — ABNORMAL HIGH (ref 70–99)
Glucose-Capillary: 170 mg/dL — ABNORMAL HIGH (ref 70–99)
Glucose-Capillary: 127 mg/dL — ABNORMAL HIGH (ref 70–99)

## 2021-09-21 LAB — KOH PREP: KOH Prep: NONE SEEN

## 2021-09-21 LAB — MAGNESIUM: Magnesium: 1.8 mg/dL (ref 1.7–2.4)

## 2021-09-21 SURGERY — ESOPHAGOGASTRODUODENOSCOPY (EGD) WITH PROPOFOL
Anesthesia: General

## 2021-09-21 MED ORDER — PROPOFOL 10 MG/ML IV BOLUS
INTRAVENOUS | Status: DC | PRN
  Administered 2021-09-21: 80 mg via INTRAVENOUS
  Administered 2021-09-21 (×3): 50 mg via INTRAVENOUS
  Administered 2021-09-21: 120 mg via INTRAVENOUS

## 2021-09-21 MED ORDER — LACTATED RINGERS IV SOLN: INTRAVENOUS | Status: DC | PRN

## 2021-09-21 MED ORDER — CEFDINIR 300 MG PO CAPS
300.0000 mg | ORAL_CAPSULE | Freq: Two times a day (BID) | ORAL | Status: DC
  Administered 2021-09-21 – 2021-09-24 (×6): 300 mg via ORAL
  Filled 2021-09-21 (×6): qty 1

## 2021-09-21 MED ORDER — LIDOCAINE HCL (PF) 2 % IJ SOLN
INTRAMUSCULAR | Status: AC
  Filled 2021-09-21: qty 5

## 2021-09-21 MED ORDER — PHENYLEPHRINE 40 MCG/ML (10ML) SYRINGE FOR IV PUSH (FOR BLOOD PRESSURE SUPPORT)
PREFILLED_SYRINGE | INTRAVENOUS | Status: AC
  Filled 2021-09-21: qty 10

## 2021-09-21 MED ORDER — LIDOCAINE HCL 1 % IJ SOLN
INTRAMUSCULAR | Status: DC | PRN
  Administered 2021-09-21: 50 mg via INTRADERMAL

## 2021-09-21 MED ORDER — FUROSEMIDE 40 MG PO TABS
40.0000 mg | ORAL_TABLET | Freq: Every day | ORAL | Status: DC
Start: 2021-09-22 — End: 2021-09-22
  Administered 2021-09-22: 40 mg via ORAL
  Filled 2021-09-21: qty 1

## 2021-09-21 MED ORDER — DEXMEDETOMIDINE (PRECEDEX) IN NS 20 MCG/5ML (4 MCG/ML) IV SYRINGE
PREFILLED_SYRINGE | INTRAVENOUS | Status: DC | PRN
  Administered 2021-09-21: 20 ug via INTRAVENOUS

## 2021-09-21 MED ORDER — PHENYLEPHRINE 40 MCG/ML (10ML) SYRINGE FOR IV PUSH (FOR BLOOD PRESSURE SUPPORT)
PREFILLED_SYRINGE | INTRAVENOUS | Status: DC | PRN
  Administered 2021-09-21: 80 ug via INTRAVENOUS

## 2021-09-21 MED ORDER — DEXMEDETOMIDINE (PRECEDEX) IN NS 20 MCG/5ML (4 MCG/ML) IV SYRINGE
PREFILLED_SYRINGE | INTRAVENOUS | Status: AC
  Filled 2021-09-21: qty 5

## 2021-09-21 MED ORDER — SODIUM CHLORIDE 0.9 % IV SOLN: INTRAVENOUS | Status: DC

## 2021-09-21 MED ORDER — AZITHROMYCIN 250 MG PO TABS
500.0000 mg | ORAL_TABLET | Freq: Every day | ORAL | Status: DC
  Administered 2021-09-21 – 2021-09-24 (×4): 500 mg via ORAL
  Filled 2021-09-21 (×4): qty 2

## 2021-09-21 MED ORDER — FUROSEMIDE 10 MG/ML IJ SOLN
40.0000 mg | Freq: Once | INTRAMUSCULAR | Status: AC
Start: 2021-09-21 — End: 2021-09-21
  Administered 2021-09-21: 40 mg via INTRAVENOUS
  Filled 2021-09-21: qty 4

## 2021-09-21 NOTE — Op Note (Signed)
[facility] ?Patient Name: Candice Stanley ?Procedure Date: 09/21/2021 11:44 AM ?MRN: [id number] ?Date of Birth: [birth year] ?Attending MD: Katrinka Blazing ,  ?CSN: [id number] ?Age: 56 ?Admit Type: Outpatient ?Procedure:                Upper GI endoscopy ?Indications:              Odynophagia, Cirrhosis rule out esophageal varices ?Providers:                Katrinka Blazing, Enzo Montgomery RN, RN, Dyann Ruddle ?Referring MD:              ?Medicines:                Monitored Anesthesia Care ?Complications:            No immediate complications. ?Estimated Blood Loss:     Estimated blood loss: none. ?Procedure:                Pre-Anesthesia Assessment: ?                          - Prior to the procedure, a History and Physical  ?                          was performed, and patient medications, allergies  ?                          and sensitivities were reviewed. The patient's  ?                          tolerance of previous anesthesia was reviewed. ?                          - The risks and benefits of the procedure and the  ?                          sedation options and risks were discussed with the  ?                          patient. All questions were answered and informed  ?                          consent was obtained. ?                          - ASA Grade Assessment: III - A patient with severe  ?                          systemic disease. ?                          After obtaining informed consent, the endoscope was  ?                          passed under direct vision. Throughout the  ?                          procedure,  the patient's blood pressure, pulse, and  ?                          oxygen saturations were monitored continuously. The  ?                          GIF-H190 ([id number]) scope was introduced through the  ?                          mouth, and advanced to the second part of duodenum.  ?                          The upper GI endoscopy was accomplished without  ?                           difficulty. The patient tolerated the procedure  ?                          well. ?Scope In: 11:59:27 AM ?Scope Out: 12:07:42 PM ?Total Procedure Duration: 0 hours 8 minutes 15 seconds  ?Findings: ?     LA Grade D (one or more mucosal breaks involving at least 75% of  ?     esophageal circumference) esophagitis with scant bleeding was found 26  ?     to 39 cm from the incisors. Cells for cytology were obtained by  ?     brushing. Given inflammation, visualization of the lining was difficult  ?     to determine if there were any small esophageal varices, but there were  ?     no medium/large size varices. ?     Mild portal hypertensive gastropathy was found in the entire examined  ?     stomach. Biopsies were taken with a cold forceps for histology. No  ?     gastric varices were observed upon careful inspection inforward and  ?     retroflexed view ?     The examined duodenum was normal. ?Impression:               - LA Grade D reflux and candidiasis esophagitis  ?                          with bleeding. Cells for cytology obtained. ?                          - Portal hypertensive gastropathy. Biopsied. ?                          - Normal examined duodenum. ?Moderate Sedation: ?     Per Anesthesia Care ?Recommendation:           - Return patient to hospital ward for ongoing care. ?                          - Advance diet as tolerated. ?                          - Await pathology results. ?                          -  Continue pantoprazole 40 mg every 12 hours,  ?                          sucralfate slurry 1 g every 6 hours and magic  ?                          mouthwash every 6 hours. ?                          - Repeat upper endoscopy in 3 months for  ?                          surveillance. ?Procedure Code(s):        --- Professional --- ?                          [id number], Esophagogastroduodenoscopy, flexible,  ?                          transoral; with biopsy, single or multiple ?Diagnosis Code(s):        ---  Professional --- ?                          K21.01, Gastro-esophageal reflux disease with  ?                          esophagitis, with bleeding ?                          B37.81, Candidal esophagitis ?                          K76.6, Portal hypertension ?                          K31.89, Other diseases of stomach and duodenum ?                          R13.10, Dysphagia, unspecified ?                          K74.60, Unspecified cirrhosis of liver ?CPT copyright 2019 American Medical Association. All rights reserved. ?The codes documented in this report are preliminary and upon coder review may  ?be revised to meet current compliance requirements. ?Katrinka Blazinganiel Castaneda, MD ?Katrinka Blazinganiel Castaneda,  ?09/21/2021 12:18:37 PM ?This report has been signed electronically. ?Number of Addenda: 0 ?

## 2021-09-21 NOTE — Anesthesia Preprocedure Evaluation (Signed)
Anesthesia Evaluation  ?Patient identified by MRN, date of birth, ID band ?Patient awake ? ? ? ?Reviewed: ?Allergy & Precautions, H&P , NPO status , Patient's Chart, lab work & pertinent test results, reviewed documented beta blocker date and time  ? ?Airway ?Mallampati: II ? ?TM Distance: >3 FB ?Neck ROM: full ? ? ? Dental ?no notable dental hx. ? ?  ?Pulmonary ?neg pulmonary ROS, former smoker,  ?  ?Pulmonary exam normal ?breath sounds clear to auscultation ? ? ? ? ? ? Cardiovascular ?Exercise Tolerance: Good ?hypertension, negative cardio ROS ? ? ?Rhythm:regular Rate:Normal ? ? ?  ?Neuro/Psych ?PSYCHIATRIC DISORDERS Anxiety Depression  Neuromuscular disease   ? GI/Hepatic ?Neg liver ROS, GERD  Medicated,  ?Endo/Other  ?diabetes, Type 2Hypothyroidism Morbid obesity ? Renal/GU ?negative Renal ROS  ?negative genitourinary ?  ?Musculoskeletal ? ? Abdominal ?  ?Peds ? Hematology ?negative hematology ROS ?(+)   ?Anesthesia Other Findings ??1. Left ventricular ejection fraction, by estimation, is 70 to 75%. The  ?left ventricle has hyperdynamic function. The left ventricle has no  ?regional wall motion abnormalities. Left ventricular diastolic parameters  ?were normal.  ??2. Right ventricular systolic function is normal. The right ventricular  ?size is normal.  ??3. Left atrial size was moderately dilated.  ??4. The mitral valve is normal in structure. Trivial mitral valve  ?regurgitation.  ??5. The aortic valve was not well visualized. Aortic valve regurgitation  ?is not visualized. No aortic stenosis is present.  ??6. The inferior vena cava is dilated in size with <50% respiratory  ?variability, suggesting right atrial pressure of 15 mmHg.  ? Reproductive/Obstetrics ?negative OB ROS ? ?  ? ? ? ? ? ? ? ? ? ? ? ? ? ?  ?  ? ? ? ? ? ? ? ? ?Anesthesia Physical ?Anesthesia Plan ? ?ASA: 3 ? ?Anesthesia Plan: General  ? ?Post-op Pain Management:   ? ?Induction:  ? ?PONV Risk Score and Plan:  Propofol infusion ? ?Airway Management Planned:  ? ?Additional Equipment:  ? ?Intra-op Plan:  ? ?Post-operative Plan:  ? ?Informed Consent: I have reviewed the patients History and Physical, chart, labs and discussed the procedure including the risks, benefits and alternatives for the proposed anesthesia with the patient or authorized representative who has indicated his/her understanding and acceptance.  ? ? ? ?Dental Advisory Given ? ?Plan Discussed with: CRNA ? ?Anesthesia Plan Comments:   ? ? ? ? ? ? ?Anesthesia Quick Evaluation ? ?

## 2021-09-21 NOTE — Transfer of Care (Signed)
Immediate Anesthesia Transfer of Care Note ? ?Patient: Candice Stanley ? ?Procedure(s) Performed: ESOPHAGOGASTRODUODENOSCOPY (EGD) WITH PROPOFOL ?BIOPSY ?ESOPHAGEAL BRUSHING ? ?Patient Location: PACU ? ?Anesthesia Type:General ? ?Level of Consciousness: awake ? ?Airway & Oxygen Therapy: Patient Spontanous Breathing and Patient connected to nasal cannula oxygen ? ?Post-op Assessment: Report given to RN and Post -op Vital signs reviewed and stable ? ?Post vital signs: Reviewed and stable ? ?Last Vitals:  ?Vitals Value Taken Time  ?BP    ?Temp    ?Pulse 96 09/21/21 1213  ?Resp 22 09/21/21 1213  ?SpO2 99 % 09/21/21 1213  ?Vitals shown include unvalidated device data. ? ?Last Pain:  ?Vitals:  ? 09/21/21 1151  ?TempSrc:   ?PainSc: 0-No pain  ?   ? ?Patients Stated Pain Goal: 7 (09/21/21 1018) ? ?Complications: No notable events documented. ?

## 2021-09-21 NOTE — Assessment & Plan Note (Addendum)
3/12 CT chest--multifocal GGO and consolidation ?3/13 CT abd--Bibasilar ground-glass opacity, likely pulmonary edema. ?Due to Hepatothorax ?3/13 Echo--EF 70-75%, no WMA, normal diastolic function ?Received IV lasix 3/12, 3/13, 3/14 ?Continue on oral Lasix ? ?

## 2021-09-21 NOTE — Progress Notes (Signed)
We will proceed with EGD as scheduled.  I thoroughly discussed with the patient his procedure, including the risks involved. Patient understands what the procedure involves including the benefits and any risks. Patient understands alternatives to the proposed procedure. Risks including (but not limited to) bleeding, tearing of the lining (perforation), rupture of adjacent organs, problems with heart and lung function, infection, and medication reactions. A small percentage of complications may require surgery, hospitalization, repeat endoscopic procedure, and/or transfusion.  Patient understood and agreed.  Toini Failla Castaneda, MD Gastroenterology and Hepatology  [facility]  

## 2021-09-21 NOTE — Anesthesia Procedure Notes (Signed)
Date/Time: 09/21/2021 12:00 PM ?Performed by: Julian Reil, CRNA ?Pre-anesthesia Checklist: Patient identified, Emergency Drugs available, Suction available and Patient being monitored ?Patient Re-evaluated:Patient Re-evaluated prior to induction ?Oxygen Delivery Method: Nasal cannula ?Induction Type: IV induction ?Placement Confirmation: positive ETCO2 ? ? ? ? ?

## 2021-09-21 NOTE — Progress Notes (Signed)
Pt requested a flutter valve.. flutter given and instructed with patient ?

## 2021-09-21 NOTE — Progress Notes (Signed)
?  ?       ?PROGRESS NOTE ? ?DOVEY WILLNER [id number] DOB: 24-Aug-1965 DOA: 09/17/2021 ?PCP: Celene Squibb, MD ? ?Brief History:  ?56 year old female with a history of anxiety/depression, hypertension, hyperlipidemia, diabetes mellitus type 2, thrombocytopenia and leukopenia presenting with 2-day history of generalized weakness, worsening shortness of breath and cough with brown sputum.  The patient states that she has been coughing to the point of having some posttussive emesis.  She denies any fevers, chills, chest pain, headache, neck pain, nausea, vomiting, diarrhea, abdominal pain.  She did have a small amount of blood-tinged sputum associated with an incessant cough.  She denied any worsening lower extremity edema orthopnea type symptoms.  She denies any anginal type symptoms or worsening shortness of breath with activity.  She went to urgent care on the morning of admission, she was given a prescription for Augmentin, azithromycin, and prednisone.  She states that she had 1 dose of the medications without improvement.  As result she presented for further evaluation.  She has remote history of tobacco only smoking a few cigarettes daily for 5 years, quitting in 1992.Marland Kitchen ?In the ED, the patient was afebrile and hemodynamically stable with oxygen saturation 88% on room air.  BMP showed sodium 136, potassium 3.4, bicarbonate 23, BUN 10, creatinine 0.58.  WBC 2.7, hemoglobin 11.3, platelets 49,000.  The patient was started on ceftriaxone and azithromycin. ? ?3/11-3/12--developed solid and liquid odynophagia;  small volume hemoptysis mixed with sputum.  Sob slowly improving.  No vomiting, diarrhea.  EKG and troponins not suggestive ACS.  GI consulted ? ?3/13--continues to have odynophagia and epigastric pain with solid and liquids.  No n/v/d.  CT abd/pelvis ordered.  Planning EGD 3/14.  Weaning off oxygen.  CTA chest neg for PE ? ?3/14 EGD show grade D esophagitis>>carafate and protonix bid.  Advance diet.  D/C hom  3/15 if tolerating diet  ? ? ?Assessment and Plan: ?* Severe sepsis ([place]) ?Presented with fever, leukopenia, tachypnea, and elevated lactate with respiratory failure ?Secondary to pneumonia ?Lactic acid peaked 2.4>>1.8 ?Follow-up PCT 0.17 ?Continue ceftriaxone and azithromycin ?Follow blood cultures--neg ?Sepsis physiology resolved ? ?Lobar pneumonia ([place]) ?Personally reviewed chest x-ray--bibasilar infiltrates, increased interstitial markings ?--3/12 CTA chest--no PE;  Widespread GGO and airspace consolidation ?Continue ceftriaxone and azithromycin >>po on 3/14 (D#5 of 7) ?Follow-up PCT--0.17 ?--give lasix IV x 1 ?continue duoneb ? ?Acute respiratory failure with hypoxia ([place]) ?Presented with oxygen saturation 88% on room air with tachypnea ?Due to pneumonia and pulm edema ?Stable on 2 L nasal cannula ?Wean oxygen as tolerated for saturation greater 92% ?3/12 CTA chest multifocal areas of GGO and consolidation; no PE ?3/12 and 3/13>>lasix IV ?Echo EF 70-75%, no WMA ?3/14--weaned to RA ? ?Pulmonary edema ?3/12 CT chest--multifocal GGO and consolidation ?3/13 CT abd--Bibasilar ground-glass opacity, likely pulmonary edema. ?Due to Hepatothorax ?3/13 Echo--EF 70-75%, no WMA, normal diastolic function ?Received IV lasix 3/12, 3/13, 3/14 ?-po lasix on 3/15 ? ?Cirrhosis of liver with ascites ([place]) ?07/08/2021 CTA chest suggested cirrhotic liver changes on imaging with ascites ?09/19/21 CTA chest>>hepatic cirrhosis with minimal ascites ?Patient has signs of portal hypertension and splenomegaly ?Suspect she has underlying NASH ? ?Odynophagia ?Pt states previously saw Dr. Barney Drain 2013>>EGD with dil ?Now with liquids and solid food ?Consult GI appreciated ?3/14 EGD--grade D esophagitis with bleeding ?protonix bid and sulcrafate ac/hs ? ?Thrombocytopenia ([place]) ?Secondary to splenomegaly and underlying liver cirrhosis ?Has been progressive since 2018 ?Follows Dr. Delton Coombes ?BMBX on 04/21/2021 showed slightly hypercellular  bone marrow with trilineage hematopoiesis.  Slight lymphocytosis of primarily small lymphoid cells with lack of significant aggregates.  Chromosome analysis is normal. ?Monitor for signs of bleeding ? ?Chronic leukopenia ?Secondary to splenomegaly and underlying liver cirrhosis as discussed above ?Follows Dr. Delton Coombes ?BMBX on 04/21/2021 showed slightly hypercellular bone marrow with trilineage hematopoiesis.  Slight lymphocytosis of primarily small lymphoid cells with lack of significant aggregates.  Chromosome analysis is normal. ? ?Hypokalemia ?replete ? ?Hypomagnesemia ?replete ? ?Obesity, Class III, BMI 40-49.9 (morbid obesity) ([place]) ?BMI 47.44 ?Lifestyle modification ? ?Essential hypertension ?Continue losartan ? ?Depression with anxiety ?Continue Prozac ?PDMP reviewed--last alprazolam Rx 11/25/20 ? ?Uncontrolled type 2 diabetes mellitus with hyperglycemia, without long-term current use of insulin ([place]) ?Holding metformin ?NovoLog sliding scale ?09/17/21 hemoglobin A1c--6.0 ? ?Atypical chest pain ?troponins 12>>12 ?Personally reviewed EKG>>sinus, nonspecific T wave change ?Primarily GI etiology ?Echo ? ?GERD (gastroesophageal reflux disease) ?Continue Protonix ? ? ? ? ?Status is: Inpatient ?Remains inpatient appropriate because: severity of illness.  Intolerance to diet requiring inpatient GI work up ?  ?  ?  ?Family Communication:   spouse updated  3/13 ?  ?Consultants:  GI ?  ?Code Status:  FULL  ?  ?DVT Prophylaxis:  SCDs ?  ?  ?Procedures: ?As Listed in Progress Note Above ?  ?Antibiotics: ?Ceftriaxone 3/10>> ?Azithro 3/10>> ?  ?  ? ? ? ? ? ?Subjective: ?Patient denies fevers, chills, headache, chest pain, dyspnea, nausea, vomiting, diarrhea, abdominal pain, dysuria, hematuria, hematochezia, and melena. ? ? ?Objective: ?Vitals:  ? 09/21/21 1230 09/21/21 1243 09/21/21 1309 09/21/21 1408  ?BP: (!) 133/95 136/64 (!) 113/52   ?Pulse: 66 65 69   ?Resp: 18 (!) 27 18   ?Temp:   98.1 ?F (36.7 ?C)   ?TempSrc:    Oral   ?SpO2: 96% 96% 91% 94%  ?Weight:      ?Height:      ? ? ?Intake/Output Summary (Last 24 hours) at 09/21/2021 1753 ?Last data filed at 09/21/2021 1210 ?Gross per 24 hour  ?Intake 815.06 ml  ?Output --  ?Net 815.06 ml  ? ?Weight change:  ?Exam: ? ?General:  Pt is alert, follows commands appropriately, not in acute distress ?HEENT: No icterus, No thrush, No neck mass, [place]/AT ?Cardiovascular: RRR, S1/S2, no rubs, no gallops ?Respiratory: bibasilar crackles.  No wheeze ?Abdomen: Soft/+BS, non tender, non distended, no guarding ?Extremities: trace LE edema, No lymphangitis, No petechiae, No rashes, no synovitis ? ? ?Data Reviewed: ?I have personally reviewed following labs and imaging studies ?Basic Metabolic Panel: ?Recent Labs  ?Lab 09/17/21 ?1835 09/18/21 ?0443 09/19/21 ?0503 09/20/21 ?[id number] 09/21/21 ?[id number]  ?NA 136 136 139 137 139  ?K 3.4* 3.4* 3.3* 3.0* 4.1  ?CL 105 106 108 104 105  ?CO2 23 21* 25 25 27   ?GLUCOSE 170* 222* 149* 195* 134*  ?BUN 10 10 9 7 8   ?CREATININE 0.58 0.55 0.49 0.55 0.49  ?CALCIUM 8.7* 8.2* 8.4* 8.4* 8.6*  ?MG  --  1.6* 1.9 1.8 1.8  ?PHOS  --  2.5  --   --   --   ? ?Liver Function Tests: ?Recent Labs  ?Lab 09/18/21 ?0443 09/19/21 ?0503 09/20/21 ?1451  ?AST 49* 54* 52*  ?ALT 24 25 24   ?ALKPHOS 103 98 102  ?BILITOT 2.0* 1.4* 1.6*  ?PROT 6.0* 5.7* 5.9*  ?ALBUMIN 2.8* 2.7* 2.8*  ? ?Recent Labs  ?Lab 09/20/21 ?1451  ?LIPASE 39  ? ?No results for input(s): AMMONIA in the last 168 hours. ?Coagulation Profile: ?Recent Labs  ?  Lab 09/20/21 ?1451  ?INR 1.3*  ? ?CBC: ?Recent Labs  ?Lab 09/17/21 ?1835 09/18/21 ?0443 09/19/21 ?0503 09/20/21 ?[id number] 09/21/21 ?[id number]  ?WBC 2.7* 2.4* 2.7* 2.7* 2.3*  ?NEUTROABS 2.0  --   --   --   --   ?HGB 11.3* 11.0* 11.7* 9.9* 10.2*  ?HCT 35.0* 33.9* 35.6* 32.0* 32.6*  ?MCV 94.3 96.3 96.0 95.8 97.3  ?PLT 49* 46* 43* 44* 48*  ? ?Cardiac Enzymes: ?No results for input(s): CKTOTAL, CKMB, CKMBINDEX, TROPONINI in the last 168 hours. ?BNP: ?Invalid input(s): POCBNP ?CBG: ?Recent Labs   ?Lab 09/20/21 ?1608 09/20/21 ?2026 09/21/21 ?0731 09/21/21 ?1024 09/21/21 ?1623  ?GLUCAP 99 123* 122* 109* 170*  ? ?HbA1C: ?No results for input(s): HGBA1C in the last 72 hours. ?Urine analysis: ?   ?Com

## 2021-09-21 NOTE — Brief Op Note (Signed)
09/17/2021 - 09/21/2021 ? ?12:15 PM ? ?PATIENT:  Candice Stanley  56 y.o. female ? ?PRE-OPERATIVE DIAGNOSIS:  odynophagia, history of dysphagia, epigastric pain ? ?POST-OPERATIVE DIAGNOSIS:  Grade D esophagitits; portal hypertensive gastropathy;  ? ?PROCEDURE:  Procedure(s): ?ESOPHAGOGASTRODUODENOSCOPY (EGD) WITH PROPOFOL (N/A) ?BIOPSY ?ESOPHAGEAL BRUSHING ? ?SURGEON:  Surgeon(s) and Role: ?   Harvel Quale, MD - Primary ? ?Patient underwent EGD under propofol sedation.  Tolerated the procedure adequately.  Esophagus showed LA Grade D (one or more mucosal breaks involving at least 75% of esophageal circumference) esophagitis with scant bleeding was found 26 to 39 cm from the incisors.  Cells for cytology were obtained by brushing. Given inflammation, visualization of the lining was difficult to determine if there were any small esophageal varices, but there were no medium/large size varices. Mild portal hypertensive gastropathy was found in the entire examined stomach.  Biopsies were taken with a cold forceps for histology.  No gastric varices were observed upon careful inspection inforward and retroflexed view. The examined duodenum was normal.  ? ?RECOMMENDATIONS ?- Return patient to hospital ward for ongoing care.  ?- Advance diet as tolerated.  ?- Await pathology results.  ?- Continue pantoprazole 40 mg every 12 hours, sucralfate slurry 1 g every 6 hours and magic mouthwash every 6 hours. ?- Repeat upper endoscopy in 3 months for surveillance.  ? ?Maylon Peppers, MD ?Gastroenterology and Hepatology ?[facility] ? ?

## 2021-09-22 ENCOUNTER — Encounter (HOSPITAL_COMMUNITY): Payer: Self-pay | Admitting: Gastroenterology

## 2021-09-22 ENCOUNTER — Inpatient Hospital Stay (HOSPITAL_COMMUNITY): Payer: Self-pay

## 2021-09-22 DIAGNOSIS — K209 Esophagitis, unspecified without bleeding: Secondary | ICD-10-CM

## 2021-09-22 LAB — HEPATITIS PANEL, ACUTE
HCV Ab: NONREACTIVE
Hep A IgM: NONREACTIVE
Hep B C IgM: NONREACTIVE
Hepatitis B Surface Ag: NONREACTIVE

## 2021-09-22 LAB — CBC WITH DIFFERENTIAL/PLATELET
Abs Immature Granulocytes: 0 10*3/uL (ref 0.00–0.07)
Basophils Absolute: 0 10*3/uL (ref 0.0–0.1)
Basophils Relative: 0 %
Eosinophils Absolute: 0 10*3/uL (ref 0.0–0.5)
Eosinophils Relative: 1 %
HCT: 32.7 % — ABNORMAL LOW (ref 36.0–46.0)
Hemoglobin: 10.3 g/dL — ABNORMAL LOW (ref 12.0–15.0)
Immature Granulocytes: 0 %
Lymphocytes Relative: 41 %
Lymphs Abs: 0.9 10*3/uL (ref 0.7–4.0)
MCH: 30.7 pg (ref 26.0–34.0)
MCHC: 31.5 g/dL (ref 30.0–36.0)
MCV: 97.3 fL (ref 80.0–100.0)
Monocytes Absolute: 0.2 10*3/uL (ref 0.1–1.0)
Monocytes Relative: 7 %
Neutro Abs: 1.1 10*3/uL — ABNORMAL LOW (ref 1.7–7.7)
Neutrophils Relative %: 51 %
Platelets: 47 10*3/uL — ABNORMAL LOW (ref 150–400)
RBC: 3.36 MIL/uL — ABNORMAL LOW (ref 3.87–5.11)
RDW: 16.1 % — ABNORMAL HIGH (ref 11.5–15.5)
WBC: 2.2 10*3/uL — ABNORMAL LOW (ref 4.0–10.5)
nRBC: 0 % (ref 0.0–0.2)

## 2021-09-22 LAB — COMPREHENSIVE METABOLIC PANEL
ALT: 21 U/L (ref 0–44)
AST: 42 U/L — ABNORMAL HIGH (ref 15–41)
Albumin: 2.6 g/dL — ABNORMAL LOW (ref 3.5–5.0)
Alkaline Phosphatase: 98 U/L (ref 38–126)
Anion gap: 8 (ref 5–15)
BUN: 11 mg/dL (ref 6–20)
CO2: 28 mmol/L (ref 22–32)
Calcium: 9 mg/dL (ref 8.9–10.3)
Chloride: 104 mmol/L (ref 98–111)
Creatinine, Ser: 0.53 mg/dL (ref 0.44–1.00)
GFR, Estimated: >60 mL/min (ref >60)
Glucose, Bld: 117 mg/dL — ABNORMAL HIGH (ref 70–99)
Potassium: 3.6 mmol/L (ref 3.5–5.1)
Sodium: 140 mmol/L (ref 135–145)
Total Bilirubin: 1.5 mg/dL — ABNORMAL HIGH (ref 0.3–1.2)
Total Protein: 5.7 g/dL — ABNORMAL LOW (ref 6.5–8.1)

## 2021-09-22 LAB — MAGNESIUM: Magnesium: 1.9 mg/dL (ref 1.7–2.4)

## 2021-09-22 LAB — GLUCOSE, CAPILLARY
Glucose-Capillary: 104 mg/dL — ABNORMAL HIGH (ref 70–99)
Glucose-Capillary: 145 mg/dL — ABNORMAL HIGH (ref 70–99)
Glucose-Capillary: 173 mg/dL — ABNORMAL HIGH (ref 70–99)
Glucose-Capillary: 134 mg/dL — ABNORMAL HIGH (ref 70–99)

## 2021-09-22 LAB — PROTIME-INR
INR: 1.4 — ABNORMAL HIGH (ref 0.8–1.2)
Prothrombin Time: 17.1 seconds — ABNORMAL HIGH (ref 11.4–15.2)

## 2021-09-22 LAB — SURGICAL PATHOLOGY

## 2021-09-22 MED ORDER — HYDROCODONE BIT-HOMATROP MBR 5-1.5 MG/5ML PO SOLN
5.0000 mL | Freq: Four times a day (QID) | ORAL | Status: DC | PRN
  Administered 2021-09-22 – 2021-09-25 (×6): 5 mL via ORAL
  Filled 2021-09-22 (×6): qty 5

## 2021-09-22 NOTE — Anesthesia Postprocedure Evaluation (Signed)
Anesthesia Post Note ? ?Patient: Candice Stanley ? ?Procedure(s) Performed: ESOPHAGOGASTRODUODENOSCOPY (EGD) WITH PROPOFOL ?BIOPSY ?ESOPHAGEAL BRUSHING ? ?Patient location during evaluation: Phase II ?Anesthesia Type: General ?Level of consciousness: awake ?Pain management: pain level controlled ?Vital Signs Assessment: post-procedure vital signs reviewed and stable ?Respiratory status: respiratory function stable ?Cardiovascular status: blood pressure returned to baseline and stable ?Postop Assessment: no headache and no apparent nausea or vomiting ?Anesthetic complications: no ?Comments: Late entry ? ? ?No notable events documented. ? ? ?Last Vitals:  ?Vitals:  ? 09/22/21 0818 09/22/21 0819  ?BP:    ?Pulse:    ?Resp:    ?Temp:    ?SpO2: 91% 96%  ?  ?Last Pain:  ?Vitals:  ? 09/22/21 0650  ?TempSrc: Oral  ?PainSc:   ? ? ?  ?  ?  ?  ?  ?  ? ?Windell [place] ? ? ? ? ?

## 2021-09-22 NOTE — Progress Notes (Signed)
?Subjective: ?Had half of an egg and cheese sandwich for breakfast, did experience continued odynophagia with this. Was unable to eat lunch as she was brought spaghetti and did not want to try to eat something that acidic, as she usually avoids foods like this at home as well. States that odynophagia is still occurring with eating and sometimes with drinking but she is not having any residual reflux symptoms after eating. She does feel that symptoms are improving. Denies any swelling to her abdomen, no episodes of confusion, or pruritus. No nausea, vomiting. Had a very small stool yesterday, no blood or melena noted. ? ?Objective: ?Vital signs in last 24 hours: ?Temp:  [97.7 ?F (36.5 ?C)-98.7 ?F (37.1 ?C)] 97.7 ?F (36.5 ?C) (03/15 0650) ?Pulse Rate:  [60-78] 60 (03/15 0653) ?Resp:  [18-39] 21 (03/15 0650) ?BP: (103-136)/(35-95) 125/71 (03/15 0650) ?SpO2:  [90 %-98 %] 96 % (03/15 0819) ?Last BM Date : 09/20/21 ?General:   Alert and oriented, pleasant ?Head:  Normocephalic and atraumatic. ?Eyes:  No icterus, sclera clear. Conjuctiva pink.  ?Mouth:  Without lesions, mucosa pink and moist.   ?Heart:  S1, S2 present, no murmurs noted.  ?Lungs: Rhonchi and crackles on R side ?Abdomen:  Bowel sounds present, soft, non-tender. Mild TTP of epigastric region. Full but soft. No HSM or hernias noted. No rebound or guarding. No masses appreciated  ?Msk:  Symmetrical without gross deformities. Normal posture. ?Pulses:  Normal pulses noted. ?Extremities:  very mild edema, non pitting, worse to R ankle (pt reports this is baseline s/p fracture in the past) ?Neurologic:  Alert and  oriented x4;  grossly normal neurologically. ?Skin:  Warm and dry, intact without significant lesions.  ?Psych:  Alert and cooperative. Normal mood and affect. ? ?Intake/Output from previous day: ?03/14 0701 - 03/15 0700 ?In: 110 [P.O.:480; I.V.:300] ?Out: -  ?Lab Results: ?Recent Labs  ?  09/20/21 ?3817 09/21/21 ?7116 09/22/21 ?0455  ?WBC 2.7* 2.3* 2.2*   ?HGB 9.9* 10.2* 10.3*  ?HCT 32.0* 32.6* 32.7*  ?PLT 44* 48* 47*  ? ?BMET ?Recent Labs  ?  09/20/21 ?5790 09/21/21 ?3833 09/22/21 ?0455  ?NA 137 139 140  ?K 3.0* 4.1 3.6  ?CL 104 105 104  ?CO2 '25 27 28  ' ?GLUCOSE 195* 134* 117*  ?BUN '7 8 11  ' ?CREATININE 0.55 0.49 0.53  ?CALCIUM 8.4* 8.6* 9.0  ? ?LFT ?Recent Labs  ?  09/20/21 ?1451 09/22/21 ?0455  ?PROT 5.9* 5.7*  ?ALBUMIN 2.8* 2.6*  ?AST 52* 42*  ?ALT 24 21  ?ALKPHOS 102 98  ?BILITOT 1.6* 1.5*  ?BILIDIR 0.5*  --   ?IBILI 1.1*  --   ? ?PT/INR ?Recent Labs  ?  09/20/21 ?1451 09/22/21 ?0455  ?LABPROT 16.6* 17.1*  ?INR 1.3* 1.4*  ? ?Assessment: ?Candice Stanley is a 56 y.o. female with a history of HTN, GERD, diabetes, chronic leukopenia and thrombocytopenia, splenomegaly, and obesity who presented to the ED with dyspnea and cough, and was found to be in acute respiratory failure due to pneumonia.  GI was consulted for patient complaint of odynophagia. ? ?Odynophagia/dysphagia/epigastric pain: on dexilant daily outpatient and famotidine QHS for GERD. Reports that she occasionally would take 2 ibuprofen and 2 tylenol a few days in a row for pain after starting a new job recently. Sometimes takes a goody powder as needed for her migraines as well, cannot pinpoint how often, though maybe a few times per month if that, if she had a bad migraine that would not resolve, she may  take 2 or 3 days in a row. odynophagia began Saturday after she took one dose of mucinex and started to have extreme burning to her stomach and esophagus. EGD yesterday with Grade A esophagitis. Continues on PPI BID and carafate TID and QHS as well as magic mouthwash QID. Feels that symptoms are improving, she tells me she does not want to take mucinex as this seems to be what set off her symptoms initially. ? ?Decompensated Cirrhosis: etiology likely NASH given hx of DM and Obesity. Reportedly Very infrequent alcohol consumption, maybe a few drinks per year. CTA of chest with evidence of portal  hypertension with splenomegaly and cirrhosis, minimal ascites. Hep C Ab from 03/2021 nonreactive. Acute hep panel, ANA, IgG, ASMA all in process. INR 1.4 AST 42, ALT 21, ALk Phos 98, T bili 1.5, plt 47k. MELD: 12. Will require outpatient GI follow up of Cirrhosis. Notably no esophageal varices found on EGD, no previous episodes of HE. Remains a/ox4.  ? ?Constipation: has constipation at baseline, at times can go a week without having a BM, especially if she is away from home, uses fleet suppositories at home PRN which provide good results. Glycerin suppository given on 3/12, had a small BM Monday and a very small amount of stool yesterday. Miralax ordered daily though patient refused this initially because of concerns that it would give her diarrhea, especially with profuse coughing that she does at times. I discussed with patient that this should help to soften stools in order to precipitate a BM, once daily dosing is unlikely to give her diarrhea or stool incontinence, patient is amenable to trying this after discussion. ? ? ?Plan: ?Continue PPI BID ?Continue Miralax daily ?Avoid NSAIDs ?Avoid all ETOH ?2g sodium diet ?Monitor for signs of HE ?LFTs and INR daily ?Follow pending serologies ? ? LOS: 4 days  ? ? 09/22/2021, 9:07 AM ? ? ?Candice Sharber L. Alver Sorrow, MSN, APRN, AGNP-C ?Adult-Gerontology Nurse Practitioner ?[facility] ? ?

## 2021-09-22 NOTE — Progress Notes (Signed)
?Progress Note ? ? ?Patient: Candice Stanley VVZ:[id number] DOB: 19-May-1966 DOA: 09/17/2021     4 ?DOS: the patient was seen and examined on 09/22/2021 ?  ?Brief hospital course: ?56 year old female with a history of anxiety/depression, hypertension, hyperlipidemia, diabetes mellitus type 2, thrombocytopenia and leukopenia presenting with 2-day history of generalized weakness, worsening shortness of breath and cough with brown sputum.  The patient states that she has been coughing to the point of having some posttussive emesis.  She denies any fevers, chills, chest pain, headache, neck pain, nausea, vomiting, diarrhea, abdominal pain.  She did have a small amount of blood-tinged sputum associated with an incessant cough.  She denied any worsening lower extremity edema orthopnea type symptoms.  She denies any anginal type symptoms or worsening shortness of breath with activity.  She went to urgent care on the morning of admission, she was given a prescription for Augmentin, azithromycin, and prednisone.  She states that she had 1 dose of the medications without improvement.  As result she presented for further evaluation.  She has remote history of tobacco only smoking a few cigarettes daily for 5 years, quitting in 1992.Marland Kitchen ?In the ED, the patient was afebrile and hemodynamically stable with oxygen saturation 88% on room air.  BMP showed sodium 136, potassium 3.4, bicarbonate 23, BUN 10, creatinine 0.58.  WBC 2.7, hemoglobin 11.3, platelets 49,000.  The patient was started on ceftriaxone and azithromycin. ? ? ?Assessment and Plan: ?* Severe sepsis ([facility]) ?Presented with fever, leukopenia, tachypnea, and elevated lactate with respiratory failure ?Secondary to pneumonia ?Lactic acid peaked 2.4>>1.8 ?Follow-up PCT 0.17 ?Continue ceftriaxone and azithromycin ?Follow blood cultures--neg ?Sepsis physiology resolved ? ?Lobar pneumonia ([facility]) ?Personally reviewed chest x-ray--bibasilar infiltrates, increased interstitial  markings ?--3/12 CTA chest--no PE;  Widespread GGO and airspace consolidation ?Continue ceftriaxone and azithromycin >>po on 3/14  ?Follow-up PCT--0.17 ?continue duoneb, pulmicort ?Started on hycodan for persistent cough ? ?Acute respiratory failure with hypoxia ([facility]) ?Presented with oxygen saturation 88% on room air with tachypnea ?Due to pneumonia and pulm edema ?Stable on 2 L nasal cannula ?Wean oxygen as tolerated for saturation greater 92% ?3/12 CTA chest multifocal areas of GGO and consolidation; no PE ?3/12 and 3/13>>lasix IV ?Echo EF 70-75%, no WMA ?Continue to wean off oxygen as tolerated ?Will repeat chest xray today ? ?Pulmonary edema ?3/12 CT chest--multifocal GGO and consolidation ?3/13 CT abd--Bibasilar ground-glass opacity, likely pulmonary edema. ?Due to Hepatothorax ?3/13 Echo--EF 70-75%, no WMA, normal diastolic function ?Received IV lasix 3/12, 3/13, 3/14 ? ? ?Cirrhosis of liver with ascites ([facility]CC) ?07/08/2021 CTA chest suggested cirrhotic liver changes on imaging with ascites ?09/19/21 CTA chest>>hepatic cirrhosis with minimal ascites ?Patient has signs of portal hypertension and splenomegaly ?Suspect she has underlying NASH ?Appreciate GI input ? ?Odynophagia ?Pt states previously saw Dr. Jonette Eva 2013>>EGD with dil ?Now with liquids and solid food ?Consult GI appreciated ?3/14 EGD--grade D esophagitis with bleeding ?protonix bid and sulcrafate ac/hs ? ?Thrombocytopenia (HCC) ?Secondary to splenomegaly and underlying liver cirrhosis ?Has been progressive since 2018 ?Follows Dr. Ellin Saba ?BMBX on 04/21/2021 showed slightly hypercellular bone marrow with trilineage hematopoiesis.  Slight lymphocytosis of primarily small lymphoid cells with lack of significant aggregates.  Chromosome analysis is normal. ?Monitor for signs of bleeding ? ?Chronic leukopenia ?Secondary to splenomegaly and underlying liver cirrhosis as discussed above ?Follows Dr. Ellin Saba ?BMBX on 04/21/2021 showed slightly  hypercellular bone marrow with trilineage hematopoiesis.  Slight lymphocytosis of primarily small lymphoid cells with lack of significant aggregates.  Chromosome analysis is  normal. ? ?Hypokalemia ?replete ? ?Hypomagnesemia ?replete ? ?Obesity, Class III, BMI 40-49.9 (morbid obesity) (HCC) ?BMI 47.44 ?Lifestyle modification ? ?Essential hypertension ?Continue losartan ? ?Depression with anxiety ?Continue Prozac ?PDMP reviewed--last alprazolam Rx 11/25/20 ? ?Uncontrolled type 2 diabetes mellitus with hyperglycemia, without long-term current use of insulin (HCC) ?Holding metformin ?NovoLog sliding scale ?09/17/21 hemoglobin A1c--6.0 ? ?Atypical chest pain ?troponins 12>>12 ?Personally reviewed EKG>>sinus, nonspecific T wave change ?Primarily GI etiology ? ? ?GERD (gastroesophageal reflux disease) ?Continue Protonix ? ? ? ? ?  ? ?Subjective: Continues to have productive cough and wheezing.  Finding difficult to get any rest due to cough.  Cough is productive of clear-colored sputum. ? ?Physical Exam: ?Vitals:  ? 09/22/21 0819 09/22/21 1227 09/22/21 1402 09/22/21 1948  ?BP:  (!) 108/52    ?Pulse:  73    ?Resp:  18    ?Temp:  98.1 ?F (36.7 ?C)    ?TempSrc:  Oral    ?SpO2: 96% 92% (!) 87% 98%  ?Weight:      ?Height:      ? ?General exam: Alert, awake, oriented x 3 ?Respiratory system: Mild wheeze bilaterally. Respiratory effort normal. ?Cardiovascular system:RRR. No murmurs, rubs, gallops. ?Gastrointestinal system: Abdomen is nondistended, soft and nontender. No organomegaly or masses felt. Normal bowel sounds heard. ?Central nervous system: Alert and oriented. No focal neurological deficits. ?Extremities: No C/C/E, +pedal pulses ?Skin: No rashes, lesions or ulcers ?Psychiatry: Judgement and insight appear normal. Mood & affect appropriate.  ? ?Data Reviewed: ? ?Reviewed CBC, chemistry, order chest x-ray ? ?Family Communication: Updated patient's daughter at the bedside ? ?Disposition: ?Status is: Inpatient ?Remains  inpatient appropriate because: Continued shortness of breath, wheezing and cough ? Planned Discharge Destination: Home ? ? ? ?Time spent: 35 minutes ? ?Author: ?Erick Blinks, MD ?09/22/2021 8:22 PM ? ?For on call review [email or web address].  ?

## 2021-09-23 ENCOUNTER — Telehealth: Payer: Self-pay | Admitting: Gastroenterology

## 2021-09-23 DIAGNOSIS — K2100 Gastro-esophageal reflux disease with esophagitis, without bleeding: Secondary | ICD-10-CM

## 2021-09-23 LAB — COMPREHENSIVE METABOLIC PANEL
ALT: 22 U/L (ref 0–44)
AST: 53 U/L — ABNORMAL HIGH (ref 15–41)
Albumin: 2.7 g/dL — ABNORMAL LOW (ref 3.5–5.0)
Alkaline Phosphatase: 105 U/L (ref 38–126)
Anion gap: 10 (ref 5–15)
BUN: 9 mg/dL (ref 6–20)
CO2: 25 mmol/L (ref 22–32)
Calcium: 8.8 mg/dL — ABNORMAL LOW (ref 8.9–10.3)
Chloride: 103 mmol/L (ref 98–111)
Creatinine, Ser: 0.63 mg/dL (ref 0.44–1.00)
GFR, Estimated: >60 mL/min (ref >60)
Glucose, Bld: 189 mg/dL — ABNORMAL HIGH (ref 70–99)
Potassium: 3.5 mmol/L (ref 3.5–5.1)
Sodium: 138 mmol/L (ref 135–145)
Total Bilirubin: 1.3 mg/dL — ABNORMAL HIGH (ref 0.3–1.2)
Total Protein: 5.9 g/dL — ABNORMAL LOW (ref 6.5–8.1)

## 2021-09-23 LAB — GLUCOSE, CAPILLARY
Glucose-Capillary: 104 mg/dL — ABNORMAL HIGH (ref 70–99)
Glucose-Capillary: 127 mg/dL — ABNORMAL HIGH (ref 70–99)
Glucose-Capillary: 148 mg/dL — ABNORMAL HIGH (ref 70–99)
Glucose-Capillary: 121 mg/dL — ABNORMAL HIGH (ref 70–99)
Glucose-Capillary: 273 mg/dL — ABNORMAL HIGH (ref 70–99)

## 2021-09-23 LAB — CULTURE, BLOOD (ROUTINE X 2)
Culture: NO GROWTH
Culture: NO GROWTH

## 2021-09-23 LAB — PROTIME-INR
INR: 1.3 — ABNORMAL HIGH (ref 0.8–1.2)
Prothrombin Time: 16.4 seconds — ABNORMAL HIGH (ref 11.4–15.2)

## 2021-09-23 LAB — IGG: IgG (Immunoglobin G), Serum: 1261 mg/dL (ref 586–1602)

## 2021-09-23 LAB — ANA: Anti Nuclear Antibody (ANA): NEGATIVE

## 2021-09-23 MED ORDER — POTASSIUM CHLORIDE 20 MEQ PO PACK
20.0000 meq | PACK | Freq: Every day | ORAL | Status: DC
  Administered 2021-09-24 – 2021-09-25 (×2): 20 meq via ORAL
  Filled 2021-09-23 (×2): qty 1

## 2021-09-23 MED ORDER — METHYLPREDNISOLONE SODIUM SUCC 40 MG IJ SOLR
40.0000 mg | Freq: Two times a day (BID) | INTRAMUSCULAR | Status: DC
  Administered 2021-09-23 – 2021-09-24 (×2): 40 mg via INTRAVENOUS
  Filled 2021-09-23 (×2): qty 1

## 2021-09-23 MED ORDER — SODIUM CHLORIDE 3 % IN NEBU
4.0000 mL | INHALATION_SOLUTION | Freq: Two times a day (BID) | RESPIRATORY_TRACT | Status: DC
Start: 2021-09-23 — End: 2021-09-25
  Administered 2021-09-23 – 2021-09-25 (×3): 4 mL via RESPIRATORY_TRACT
  Filled 2021-09-23 (×3): qty 4

## 2021-09-23 MED ORDER — METOCLOPRAMIDE HCL 5 MG/ML IJ SOLN
10.0000 mg | Freq: Four times a day (QID) | INTRAMUSCULAR | Status: DC
  Administered 2021-09-23 – 2021-09-24 (×3): 10 mg via INTRAVENOUS
  Filled 2021-09-23 (×4): qty 2

## 2021-09-23 MED ORDER — POTASSIUM CHLORIDE CRYS ER 20 MEQ PO TBCR
40.0000 meq | EXTENDED_RELEASE_TABLET | ORAL | Status: AC
  Administered 2021-09-23 (×2): 40 meq via ORAL
  Filled 2021-09-23 (×2): qty 2

## 2021-09-23 MED ORDER — FUROSEMIDE 40 MG PO TABS
40.0000 mg | ORAL_TABLET | Freq: Every day | ORAL | Status: DC
  Administered 2021-09-23 – 2021-09-24 (×2): 40 mg via ORAL
  Filled 2021-09-23 (×2): qty 1

## 2021-09-23 NOTE — Progress Notes (Signed)
GI attending note ? ?Patient interviewed. ?She remains with cough.  She was started on Solu-Medrol by Dr. Kerry Hough as she is concerned cough may be primarily due to pneumonia. ?I am concerned that she may also have an element of gastroparesis given recent diagnosis of severe reflux esophagitis. ?Therefore would recommend short-term therapy with metoclopramide initially IV and then orally unless she develops side effects. ?Pros and cons of therapy discussed with patient and her son Candice Stanley who is at bedside. ?Patient is agreeable. ?Patient begun on metoclopramide 10 mg IV 4 times a day. ? ?Please see Ms. Candice Bonito, NP's note for other recommendations. ?

## 2021-09-23 NOTE — Progress Notes (Signed)
Gastroenterology Progress Note   Referring Provider: No ref. provider found Primary Care Physician:  Benita Stabile, MD Primary Gastroenterologist:  Dr. Marletta Lor (newly assigned)  Patient ID: Candice Stanley; [id number]; [birth year]    Subjective   She has been able to tolerate her diet for the most part.  She did report some issues with bacon this morning.  She does report that she does not typically eat red meat or greasy foods because it causes worsening reflux issues.  Overall her abdominal pain is somewhat improved.  She continues to deny any nausea vomiting, significant abdominal pain, melena, hematochezia, dysphagia.  She has reported that she has not had a bowel movement in about 2 days, had her first dose of MiraLAX this morning, had very little relief from suppository the other day, not as much as she thought she may have.  She stated that she would reach out if she felt like she needed an enema or suppository.  She has reported some gas.  She denies any confusion or hallucinations.  She does report that she is not sleeping well and has noted worsening cough.  She is back on oxygen today at 2 L/min.    Objective   Vital signs in last 24 hours Temp:  [97.8 F (36.6 C)-98.1 F (36.7 C)] 97.8 F (36.6 C) (03/16 0538) Pulse Rate:  [69-89] 69 (03/16 0538) Resp:  [18-20] 18 (03/16 0538) BP: (103-118)/(43-58) 103/43 (03/16 0538) SpO2:  [87 %-100 %] 100 % (03/16 0827) Last BM Date : 09/20/21  Physical Exam General:   Alert and oriented, pleasant Head:  Normocephalic and atraumatic. Eyes:  No icterus, sclera clear. Conjuctiva pink.  Heart:  S1, S2 present, no murmurs noted.  Lungs: Clear to auscultation bilaterally, slightly diminished, without wheezing, rales, or rhonchi. Persistent dry cough. Abdomen:  Bowel sounds present, soft, non-tender, non-distended. No HSM or hernias noted. No rebound or guarding. No masses appreciated  Msk:  Symmetrical without gross deformities. Normal  posture. Extremities:  Without clubbing or edema. Neurologic:  Alert and  oriented x4;  grossly normal neurologically. Skin:  Warm and dry, intact without significant lesions.  Psych:  Alert and cooperative. Normal mood and affect.  Intake/Output from previous day: 03/15 0701 - 03/16 0700 In: 1196 [P.O.:1196] Out: -  Intake/Output this shift: No intake/output data recorded.  Lab Results  Recent Labs    09/21/21 0538 09/22/21 0455  WBC 2.3* 2.2*  HGB 10.2* 10.3*  HCT 32.6* 32.7*  PLT 48* 47*   BMET Recent Labs    09/21/21 0538 09/22/21 0455  NA 139 140  K 4.1 3.6  CL 105 104  CO2 27 28  GLUCOSE 134* 117*  BUN 8 11  CREATININE 0.49 0.53  CALCIUM 8.6* 9.0   LFT Recent Labs    09/20/21 1451 09/22/21 0455  PROT 5.9* 5.7*  ALBUMIN 2.8* 2.6*  AST 52* 42*  ALT 24 21  ALKPHOS 102 98  BILITOT 1.6* 1.5*  BILIDIR 0.5*  --   IBILI 1.1*  --    PT/INR Recent Labs    09/20/21 1451 09/22/21 0455  LABPROT 16.6* 17.1*  INR 1.3* 1.4*   Hepatitis Panel Recent Labs    09/22/21 0455  HEPBSAG NON REACTIVE  HCVAB NON REACTIVE  HEPAIGM NON REACTIVE  HEPBIGM NON REACTIVE     Studies/Results DG Chest 2 View  Result Date: 09/17/2021 CLINICAL DATA:  Progressively work is thinning shortness of breath with weakness. EXAM: CHEST - 2 VIEW COMPARISON:  07/08/2021 FINDINGS: Indistinct airspace type densities at the bases. No Kerley lines, effusion, or pneumothorax. Mild cardiomegaly. Stable mediastinal and hilar contours including hilar prominence which is likely vascular based on 07/08/2021 CT IMPRESSION: Infiltrates at both lung bases. Followup PA and lateral chest X-ray is recommended in 3-4 weeks following trial of antibiotic therapy to ensure resolution. Electronically Signed   By: Tiburcio Pea M.D.   On: 09/17/2021 09:24   CT Angio Chest Pulmonary Embolism (PE) W or WO Contrast  Result Date: 09/19/2021 CLINICAL DATA:  Pleuritic chest pain, dyspnea, and hemoptysis.  Hypoxia. Clinical suspicion for pulmonary embolism. EXAM: CT ANGIOGRAPHY CHEST WITH CONTRAST TECHNIQUE: Multidetector CT imaging of the chest was performed using the standard protocol during bolus administration of intravenous contrast. Multiplanar CT image reconstructions and MIPs were obtained to evaluate the vascular anatomy. RADIATION DOSE REDUCTION: This exam was performed according to the departmental dose-optimization program which includes automated exposure control, adjustment of the mA and/or kV according to patient size and/or use of iterative reconstruction technique. CONTRAST:  75mL OMNIPAQUE IOHEXOL 350 MG/ML SOLN COMPARISON:  07/08/2021 FINDINGS: Cardiovascular: Satisfactory opacification of pulmonary arteries noted, and no pulmonary emboli identified. No evidence of thoracic aortic dissection or aneurysm. Mediastinum/Nodes: No masses or pathologically enlarged lymph nodes identified. Lungs/Pleura: New multifocal areas of ground-glass opacity and airspace consolidation are seen throughout both lungs, which may be due to infection, inflammatory process, or hemorrhage. No evidence of parenchymal cavitation. No evidence of pleural effusion. Upper abdomen: Hepatic cirrhosis is noted with minimal ascites. Recanalization of paraumbilical veins and splenomegaly are consistent with pulmonary venous hypertension. 2.7 cm low-attenuation left adrenal mass is stable compared to earlier study on 07/11/2020, consistent with benign adrenal adenoma. Musculoskeletal: No suspicious bone lesions identified. Review of the MIP images confirms the above findings. IMPRESSION: No evidence of pulmonary embolism. New widespread areas of ground-glass opacity and airspace consolidation throughout both lungs. Differential diagnosis includes infection, inflammatory process, hemorrhage, or atypical edema. Hepatic cirrhosis and findings of pulmonary venous hypertension. Stable benign left adrenal adenoma. Electronically Signed   By:  Danae Orleans M.D.   On: 09/19/2021 10:58   CT ABDOMEN PELVIS W CONTRAST  Result Date: 09/20/2021 CLINICAL DATA:  Epigastric pain EXAM: CT ABDOMEN AND PELVIS WITH CONTRAST TECHNIQUE: Multidetector CT imaging of the abdomen and pelvis was performed using the standard protocol following bolus administration of intravenous contrast. RADIATION DOSE REDUCTION: This exam was performed according to the departmental dose-optimization program which includes automated exposure control, adjustment of the mA and/or kV according to patient size and/or use of iterative reconstruction technique. CONTRAST:  OMNIPAQUE IOHEXOL 300 MG/ML  SOLN COMPARISON:  Abdominal ultrasound 06/05/2018 FINDINGS: Lower Chest: Bibasilar ground-glass opacity, likely pulmonary edema. Hepatobiliary: Mild hepatic nodularity, likely early cirrhosis. Stat status post cholecystectomy. No focal liver lesion. No biliary dilatation. Pancreas: Normal pancreas. No ductal dilatation or peripancreatic fluid collection. Spleen: Spleen is enlarged, measuring 19 cm in craniocaudal dimension. Adrenals/Urinary Tract: There is an unchanged left adrenal lesion measuring 29 x 21 mm with an attenuation of 32 HU. The other adrenal gland is normal. No hydronephrosis, nephroureterolithiasis or solid renal mass. The urinary bladder is normal for degree of distention Stomach/Bowel: There is no hiatal hernia. Normal duodenal course and caliber. No small bowel dilatation or inflammation. No focal colonic abnormality. Normal appendix. Vascular/Lymphatic: Normal course and caliber of the major abdominal vessels. No abdominal or pelvic lymphadenopathy. Reproductive: Status post hysterectomy. No adnexal mass. Other: Small paraumbilical hernia. Musculoskeletal: No bony spinal canal stenosis or  focal osseous abnormality. IMPRESSION: 1. No acute abdominal or pelvic abnormality. 2. Hepatic nodularity, likely early cirrhosis. Splenomegaly, consistent with portal hypertension. 3.  Bibasilar ground-glass opacity, likely pulmonary edema. 4. 2.9 cm adrenal mass, probable benign adenoma, unchanged since 01/23/2018. Electronically Signed   By: Deatra Robinson M.D.   On: 09/20/2021 18:19   DG CHEST PORT 1 VIEW  Result Date: 09/22/2021 CLINICAL DATA:  A 56 year old female presents with coughing and shortness of breath. EXAM: PORTABLE CHEST 1 VIEW COMPARISON:  September 17, 2021. FINDINGS: EKG leads project over the chest. Trachea is midline. Cardiomediastinal contours and hilar structures are stable with mild cardiac enlargement accentuated by portable technique and AP projection. Subtle airspace opacities may be present in the mid chest and lower chest on the LEFT. Also in the RIGHT upper chest. Findings are improved compared to the imaging study of September 17, 2021 in the scout images obtained for the CT of September 19, 2021. On limited assessment there is no acute skeletal process. IMPRESSION: Improved interstitial and airspace opacities when compared to recent imaging. No lobar consolidation. Electronically Signed   By: Donzetta Kohut M.D.   On: 09/22/2021 15:51   ECHOCARDIOGRAM COMPLETE  Result Date: 09/20/2021    ECHOCARDIOGRAM REPORT   Patient Name:   IRITA SICKLE Date of Exam: 09/20/2021 Medical Rec #:  [id number]     Height:       64.0 in Accession #:    [id number]    Weight:       276.4 lb Date of Birth:  12-Nov-1965     BSA:          2.245 m Patient Age:    55 years      BP:           141/56 mmHg Patient Gender: F             HR:           80 bpm. [facility] Procedure: 2D Echo, Cardiac Doppler and Color Doppler Indications:    CHF-Acute Diastolic I50.31  History:        Patient has prior history of Echocardiogram examinations, most                 recent 03/19/2021. Risk Factors:Hypertension, Diabetes,                 Dyslipidemia and Former Smoker. Obesity.  Sonographer:    Celesta Gentile RCS Referring Phys: [phone] DAVID TAT IMPRESSIONS  1. Left ventricular ejection fraction, by  estimation, is 70 to 75%. The left ventricle has hyperdynamic function. The left ventricle has no regional wall motion abnormalities. Left ventricular diastolic parameters were normal.  2. Right ventricular systolic function is normal. The right ventricular size is normal.  3. Left atrial size was moderately dilated.  4. The mitral valve is normal in structure. Trivial mitral valve regurgitation.  5. The aortic valve was not well visualized. Aortic valve regurgitation is not visualized. No aortic stenosis is present.  6. The inferior vena cava is dilated in size with <50% respiratory variability, suggesting right atrial pressure of 15 mmHg. Comparison(s): Compared to prior study, RAP is now . FINDINGS  Left Ventricle: Left ventricular ejection fraction, by estimation, is 70 to 75%. The left ventricle has hyperdynamic function. The left ventricle has no regional wall motion abnormalities. The left ventricular internal cavity size was normal in size. There is no left ventricular hypertrophy. Left ventricular diastolic parameters were normal. Right  Ventricle: The right ventricular size is normal. No increase in right ventricular wall thickness. Right ventricular systolic function is normal. Left Atrium: Left atrial size was moderately dilated. Right Atrium: Right atrial size was normal in size. Pericardium: There is no evidence of pericardial effusion. Mitral Valve: The mitral valve is normal in structure. Trivial mitral valve regurgitation. Tricuspid Valve: The tricuspid valve is normal in structure. Tricuspid valve regurgitation is trivial. Aortic Valve: The aortic valve was not well visualized. Aortic valve regurgitation is not visualized. No aortic stenosis is present. Aortic valve mean gradient measures 11.0 mmHg. Aortic valve peak gradient measures 22.8 mmHg. Aortic valve area, by VTI measures 2.13 cm. Pulmonic Valve: The pulmonic valve was normal in structure. Pulmonic valve regurgitation is trivial.  Aorta: The aortic root is normal in size and structure. Venous: The inferior vena cava is dilated in size with less than 50% respiratory variability, suggesting right atrial pressure of 15 mmHg. IAS/Shunts: The atrial septum is grossly normal.  LEFT VENTRICLE PLAX 2D LVIDd:         5.60 cm   Diastology LVIDs:         3.10 cm   LV e' medial:    9.90 cm/s LV PW:         1.00 cm   LV E/e' medial:  10.5 LV IVS:        1.00 cm   LV e' lateral:   16.40 cm/s LVOT diam:     1.90 cm   LV E/e' lateral: 6.3 LV SV:         104 LV SV Index:   46 LVOT Area:     2.84 cm  RIGHT VENTRICLE RV S prime:     19.10 cm/s TAPSE (M-mode): 3.6 cm LEFT ATRIUM              Index        RIGHT ATRIUM           Index LA diam:        5.20 cm  2.32 cm/m   RA Area:     19.30 cm LA Vol (A2C):   98.9 ml  44.06 ml/m  RA Volume:   51.30 ml  22.85 ml/m LA Vol (A4C):   97.7 ml  43.52 ml/m LA Biplane Vol: 105.0 ml 46.78 ml/m  AORTIC VALVE AV Area (Vmax):    2.43 cm AV Area (Vmean):   2.31 cm AV Area (VTI):     2.13 cm AV Vmax:           239.00 cm/s AV Vmean:          150.000 cm/s AV VTI:            0.489 m AV Peak Grad:      22.8 mmHg AV Mean Grad:      11.0 mmHg LVOT Vmax:         205.00 cm/s LVOT Vmean:        122.000 cm/s LVOT VTI:          0.368 m LVOT/AV VTI ratio: 0.75  AORTA Ao Root diam: 3.00 cm MITRAL VALVE MV Area (PHT): 2.34 cm     SHUNTS MV Decel Time: 324 msec     Systemic VTI:  0.37 m MV E velocity: 104.00 cm/s  Systemic Diam: 1.90 cm MV A velocity: 64.30 cm/s MV E/A ratio:  1.62 Laurance Flatten MD Electronically signed by Laurance Flatten MD Signature Date/Time: 09/20/2021/5:46:57 PM    Final  Assessment  56 y.o. female with a history of HTN, GERD, diabetes, chronic leukopenia and thrombocytopenia, splenomegaly, and obesity who presented to the ED with dyspnea and cough and was found to be in acute respiratory failure due to bilateral lobe pneumonia.  GI was consulted for complaint of  odynophagia.  Odynophagia/dysphagia/epigastric pain: She had been on Dexilant daily outpatient and famotidine every night for GERD that was pretty well controlled.  She has reported occasional use of ibuprofen, and/or Goody powders as needed for pain/migraines for a few days neuro often a few times a month.  She reported odynophagia approximately 1 day after admission after she took a dose of Mucinex, and needed to have extreme epigastric pain and burning to her stomach and esophagus.  She had a CT A/P with contrast to further evaluate her abdominal pain that was ultimately negative except for hepatic nodularity showing likely early cirrhosis with splenomegaly consistent with portal hypertension.  She underwent EGD on 09/21/2021 showing LA grade D esophagitis with scant bleeding, cytology brushing was performed with negative KOH testing, no medium to large size varices noted., mild portal hypertensive gastropathy found in the entire examined stomach.  Pathology showed mild foveolar hyperplasia, negative for H. Pylori. She will need to continue on PPI twice daily. Can continue Carafate 3 times daily and at night and Magic mouthwash 4 times a day as needed.  Will need repeat EGD in 3 months for surveillance.  Decompensated cirrhosis: Patient has history of diabetes and obesity, etiology is likely due to NASH.  She has a significant family history for cirrhosis.  Reports rare alcohol consumption.  CTA chest revealed evidence of portal hypertension with splenomegaly and cirrhosis with minimal ascites.  Hep C Ab from September 2022 was nonreactive.  So far work-up has been negative for acute hepatitis, negative ANA, and normal IgG. ASMA pending.  No labs to review today, ordering CMP and INR.  We will need to continue to check these daily.  Last labs revealed INR 1.4, AST 42, ALT 21, Bili 1.5, Na 140, Albumin 2.6, Creatinine 0.53.  Last calculated MELD 3.0: 14 and Child-Pugh: 7 (Class B).  We discussed the effects  that the liver has on her lab values and her body.  She continues to not have signs of hepatic encephalopathy.  We discussed dietary restrictions, and work-up that is pending as well as importance to follow-up outpatient.   Plan / Recommendations  Continue PPI BID Continue Miralax BID Continue Carafate and Magic mouthwash as needed Avoids NSAIDs and ETOH Diet - 2g sodium Monitor for signs of hepatic encephalopathy CMP and INR daily, ordered today. Follow up ASMA Follow up outpatient for cirrhosis Repeat EGD in 3 months for surveillance.    LOS: 5 days    09/23/2021, 8:50 AM   Brooke Bonito, MSN, FNP-BC, AGACNP-BC [facility]

## 2021-09-23 NOTE — Telephone Encounter (Signed)
Can she be triaged for EGD? ?

## 2021-09-23 NOTE — Telephone Encounter (Signed)
[facility] Clinical Pool - Can we get this patient on the recall list for surveillance EGD in 3 months with Dr. Abbey Chatters for odynophagia and severe esophagitis. ?

## 2021-09-24 DIAGNOSIS — D61818 Other pancytopenia: Secondary | ICD-10-CM

## 2021-09-24 LAB — COMPREHENSIVE METABOLIC PANEL
ALT: 22 U/L (ref 0–44)
AST: 43 U/L — ABNORMAL HIGH (ref 15–41)
Albumin: 2.6 g/dL — ABNORMAL LOW (ref 3.5–5.0)
Alkaline Phosphatase: 99 U/L (ref 38–126)
Anion gap: 6 (ref 5–15)
BUN: 11 mg/dL (ref 6–20)
CO2: 26 mmol/L (ref 22–32)
Calcium: 9.1 mg/dL (ref 8.9–10.3)
Chloride: 105 mmol/L (ref 98–111)
Creatinine, Ser: 0.55 mg/dL (ref 0.44–1.00)
GFR, Estimated: >60 mL/min (ref >60)
Glucose, Bld: 207 mg/dL — ABNORMAL HIGH (ref 70–99)
Potassium: 4.5 mmol/L (ref 3.5–5.1)
Sodium: 137 mmol/L (ref 135–145)
Total Bilirubin: 1.1 mg/dL (ref 0.3–1.2)
Total Protein: 5.9 g/dL — ABNORMAL LOW (ref 6.5–8.1)

## 2021-09-24 LAB — PROTIME-INR
INR: 1.5 — ABNORMAL HIGH (ref 0.8–1.2)
Prothrombin Time: 18.1 seconds — ABNORMAL HIGH (ref 11.4–15.2)

## 2021-09-24 LAB — CBC
HCT: 32.1 % — ABNORMAL LOW (ref 36.0–46.0)
Hemoglobin: 9.8 g/dL — ABNORMAL LOW (ref 12.0–15.0)
MCH: 29.4 pg (ref 26.0–34.0)
MCHC: 30.5 g/dL (ref 30.0–36.0)
MCV: 96.4 fL (ref 80.0–100.0)
Platelets: 51 10*3/uL — ABNORMAL LOW (ref 150–400)
RBC: 3.33 MIL/uL — ABNORMAL LOW (ref 3.87–5.11)
RDW: 15.7 % — ABNORMAL HIGH (ref 11.5–15.5)
WBC: 2.3 10*3/uL — ABNORMAL LOW (ref 4.0–10.5)
nRBC: 0 % (ref 0.0–0.2)

## 2021-09-24 LAB — HEPATITIS A ANTIBODY, TOTAL: hep A Total Ab: NONREACTIVE

## 2021-09-24 LAB — ANTI-SMOOTH MUSCLE ANTIBODY, IGG: F-Actin IgG: 7 Units (ref 0–19)

## 2021-09-24 LAB — GLUCOSE, CAPILLARY
Glucose-Capillary: 182 mg/dL — ABNORMAL HIGH (ref 70–99)
Glucose-Capillary: 222 mg/dL — ABNORMAL HIGH (ref 70–99)
Glucose-Capillary: 276 mg/dL — ABNORMAL HIGH (ref 70–99)
Glucose-Capillary: 143 mg/dL — ABNORMAL HIGH (ref 70–99)

## 2021-09-24 LAB — AMMONIA: Ammonia: 62 umol/L — ABNORMAL HIGH (ref 9–35)

## 2021-09-24 MED ORDER — LACTULOSE 10 GM/15ML PO SOLN
20.0000 g | Freq: Three times a day (TID) | ORAL | Status: DC
  Administered 2021-09-24 – 2021-09-25 (×3): 20 g via ORAL
  Filled 2021-09-24 (×3): qty 30

## 2021-09-24 MED ORDER — FUROSEMIDE 20 MG PO TABS
20.0000 mg | ORAL_TABLET | Freq: Every day | ORAL | Status: DC
  Administered 2021-09-25: 20 mg via ORAL
  Filled 2021-09-24: qty 1

## 2021-09-24 NOTE — Progress Notes (Addendum)
? ?Gastroenterology Progress Note  ? ?Referring Provider: No ref. provider found ?Primary Care Physician:  Benita Stabile, MD ?Primary Gastroenterologist:  Hennie Duos. Marletta Lor, DO ? ?Patient ID: Candice Stanley; [id number]; October 04, 1965  ? ?Subjective:   ? ?Feeling better. Less pain with swallowing. Able to eat breakfast. No n/v. Less abdominal pain. Hopeful to go home today. BM this morning. No melena, brbpr. She slept better last night. Sleepy this morning. She has received three doses of IV Reglan so far.  ? ?Objective:  ? ?Vital signs in last 24 hours: ?Temp:  [97.8 ?F (36.6 ?C)-98.3 ?F (36.8 ?C)] 97.8 ?F (36.6 ?C) (03/17 0519) ?Pulse Rate:  [65-93] 65 (03/17 0519) ?Resp:  [16-18] 16 (03/17 0519) ?BP: (112-173)/(45-90) 112/67 (03/17 0519) ?SpO2:  [92 %-100 %] 92 % (03/17 0854) ?Last BM Date : 09/23/21 ?General:   Drowsy but easily arousable. Appropriate, no confusion. Quickly falls alseep during conversation. NAD ?Head:  Normocephalic and atraumatic. ?Eyes:  Sclera clear, no icterus.  ?Abdomen:  Soft, nontender and nondistended.  Normal bowel sounds, without guarding, and without rebound.   ?Extremities:  Without clubbing, deformity or edema. ?Neurologic:  Alert and  oriented x4;  grossly normal neurologically. No evidence of asterixis. ?Skin:  Intact without significant lesions or rashes. ?Psych:  Alert and cooperative. Normal mood and affect. ? ?Intake/Output from previous day: ?03/16 0701 - 03/17 0700 ?In: 1440 [P.O.:1440] ?Out: -  ?Intake/Output this shift: ?No intake/output data recorded. ? ?Lab Results: ?CBC ?Recent Labs  ?  09/22/21 ?0455 09/24/21 ?0507  ?WBC 2.2* 2.3*  ?HGB 10.3* 9.8*  ?HCT 32.7* 32.1*  ?MCV 97.3 96.4  ?PLT 47* 51*  ? ?BMET ?Recent Labs  ?  09/22/21 ?0455 09/23/21 ?1359 09/24/21 ?0507  ?NA 140 138 137  ?K 3.6 3.5 4.5  ?CL 104 103 105  ?CO2 28 25 26   ?GLUCOSE 117* 189* 207*  ?BUN 11 9 11   ?CREATININE 0.53 0.63 0.55  ?CALCIUM 9.0 8.8* 9.1  ? ?LFTs ?Recent Labs  ?  09/22/21 ?0455 09/23/21 ?1359  09/24/21 ?0507  ?BILITOT 1.5* 1.3* 1.1  ?ALKPHOS 98 105 99  ?AST 42* 53* 43*  ?ALT 21 22 22   ?PROT 5.7* 5.9* 5.9*  ?ALBUMIN 2.6* 2.7* 2.6*  ? ?No results for input(s): LIPASE in the last 72 hours. ?PT/INR ?Recent Labs  ?  09/22/21 ?0455 09/23/21 ?1359 09/24/21 ?0507  ?LABPROT 17.1* 16.4* 18.1*  ?INR 1.4* 1.3* 1.5*  ? ?Imaging Studies: ?DG Chest 2 View ? ?Result Date: 09/17/2021 ?CLINICAL DATA:  Progressively work is thinning shortness of breath with weakness. EXAM: CHEST - 2 VIEW COMPARISON:  07/08/2021 FINDINGS: Indistinct airspace type densities at the bases. No Kerley lines, effusion, or pneumothorax. Mild cardiomegaly. Stable mediastinal and hilar contours including hilar prominence which is likely vascular based on 07/08/2021 CT IMPRESSION: Infiltrates at both lung bases. Followup PA and lateral chest X-ray is recommended in 3-4 weeks following trial of antibiotic therapy to ensure resolution. Electronically Signed   By: 11/17/2021 M.D.   On: 09/17/2021 09:24  ? ?CT Angio Chest Pulmonary Embolism (PE) W or WO Contrast ? ?Result Date: 09/19/2021 ?CLINICAL DATA:  Pleuritic chest pain, dyspnea, and hemoptysis. Hypoxia. Clinical suspicion for pulmonary embolism. EXAM: CT ANGIOGRAPHY CHEST WITH CONTRAST TECHNIQUE: Multidetector CT imaging of the chest was performed using the standard protocol during bolus administration of intravenous contrast. Multiplanar CT image reconstructions and MIPs were obtained to evaluate the vascular anatomy. RADIATION DOSE REDUCTION: This exam was performed according to the departmental dose-optimization  program which includes automated exposure control, adjustment of the mA and/or kV according to patient size and/or use of iterative reconstruction technique. CONTRAST:  60mL OMNIPAQUE IOHEXOL 350 MG/ML SOLN COMPARISON:  07/08/2021 FINDINGS: Cardiovascular: Satisfactory opacification of pulmonary arteries noted, and no pulmonary emboli identified. No evidence of thoracic aortic  dissection or aneurysm. Mediastinum/Nodes: No masses or pathologically enlarged lymph nodes identified. Lungs/Pleura: New multifocal areas of ground-glass opacity and airspace consolidation are seen throughout both lungs, which may be due to infection, inflammatory process, or hemorrhage. No evidence of parenchymal cavitation. No evidence of pleural effusion. Upper abdomen: Hepatic cirrhosis is noted with minimal ascites. Recanalization of paraumbilical veins and splenomegaly are consistent with pulmonary venous hypertension. 2.7 cm low-attenuation left adrenal mass is stable compared to earlier study on 07/11/2020, consistent with benign adrenal adenoma. Musculoskeletal: No suspicious bone lesions identified. Review of the MIP images confirms the above findings. IMPRESSION: No evidence of pulmonary embolism. New widespread areas of ground-glass opacity and airspace consolidation throughout both lungs. Differential diagnosis includes infection, inflammatory process, hemorrhage, or atypical edema. Hepatic cirrhosis and findings of pulmonary venous hypertension. Stable benign left adrenal adenoma. Electronically Signed   By: Danae Orleans M.D.   On: 09/19/2021 10:58  ? ?CT ABDOMEN PELVIS W CONTRAST ? ?Result Date: 09/20/2021 ?CLINICAL DATA:  Epigastric pain EXAM: CT ABDOMEN AND PELVIS WITH CONTRAST TECHNIQUE: Multidetector CT imaging of the abdomen and pelvis was performed using the standard protocol following bolus administration of intravenous contrast. RADIATION DOSE REDUCTION: This exam was performed according to the departmental dose-optimization program which includes automated exposure control, adjustment of the mA and/or kV according to patient size and/or use of iterative reconstruction technique. CONTRAST:  OMNIPAQUE IOHEXOL 300 MG/ML  SOLN COMPARISON:  Abdominal ultrasound 06/05/2018 FINDINGS: Lower Chest: Bibasilar ground-glass opacity, likely pulmonary edema. Hepatobiliary: Mild hepatic nodularity,  likely early cirrhosis. Stat status post cholecystectomy. No focal liver lesion. No biliary dilatation. Pancreas: Normal pancreas. No ductal dilatation or peripancreatic fluid collection. Spleen: Spleen is enlarged, measuring 19 cm in craniocaudal dimension. Adrenals/Urinary Tract: There is an unchanged left adrenal lesion measuring 29 x 21 mm with an attenuation of 32 HU. The other adrenal gland is normal. No hydronephrosis, nephroureterolithiasis or solid renal mass. The urinary bladder is normal for degree of distention Stomach/Bowel: There is no hiatal hernia. Normal duodenal course and caliber. No small bowel dilatation or inflammation. No focal colonic abnormality. Normal appendix. Vascular/Lymphatic: Normal course and caliber of the major abdominal vessels. No abdominal or pelvic lymphadenopathy. Reproductive: Status post hysterectomy. No adnexal mass. Other: Small paraumbilical hernia. Musculoskeletal: No bony spinal canal stenosis or focal osseous abnormality. IMPRESSION: 1. No acute abdominal or pelvic abnormality. 2. Hepatic nodularity, likely early cirrhosis. Splenomegaly, consistent with portal hypertension. 3. Bibasilar ground-glass opacity, likely pulmonary edema. 4. 2.9 cm adrenal mass, probable benign adenoma, unchanged since 01/23/2018. Electronically Signed   By: Deatra Robinson M.D.   On: 09/20/2021 18:19  ? ?DG CHEST PORT 1 VIEW ? ?Result Date: 09/22/2021 ?CLINICAL DATA:  A 56 year old female presents with coughing and shortness of breath. EXAM: PORTABLE CHEST 1 VIEW COMPARISON:  September 17, 2021. FINDINGS: EKG leads project over the chest. Trachea is midline. Cardiomediastinal contours and hilar structures are stable with mild cardiac enlargement accentuated by portable technique and AP projection. Subtle airspace opacities may be present in the mid chest and lower chest on the LEFT. Also in the RIGHT upper chest. Findings are improved compared to the imaging study of September 17, 2021 in the scout  images obtained for the CT of September 19, 2021. On limited assessment there is no acute skeletal process. IMPRESSION: Improved interstitial and airspace opacities when compared to recent imaging. No lobar consolidation

## 2021-09-24 NOTE — Progress Notes (Signed)
Pt o2 has been stable at 96-97% on RA since around 1400 today. No distress noted. Pt has been ambulating to the bathroom independently all throughout this writers shift. ?

## 2021-09-24 NOTE — Assessment & Plan Note (Signed)
Related to underlying liver disease ?Stable ?

## 2021-09-24 NOTE — Progress Notes (Signed)
?Progress Note ? ? ?Patient: Candice Stanley BOF:[id number] DOB: [birth year] DOA: 09/17/2021     6 ?DOS: the patient was seen and examined on 09/24/2021 ?  ?Brief hospital course: ?56 year old female with a history of anxiety/depression, hypertension, hyperlipidemia, diabetes mellitus type 2, thrombocytopenia and leukopenia presenting with 2-day history of generalized weakness, worsening shortness of breath and cough with brown sputum.  The patient states that she has been coughing to the point of having some posttussive emesis.  She denies any fevers, chills, chest pain, headache, neck pain, nausea, vomiting, diarrhea, abdominal pain.  She did have a small amount of blood-tinged sputum associated with an incessant cough.  She denied any worsening lower extremity edema orthopnea type symptoms.  She denies any anginal type symptoms or worsening shortness of breath with activity.  She went to urgent care on the morning of admission, she was given a prescription for Augmentin, azithromycin, and prednisone.  She states that she had 1 dose of the medications without improvement.  As result she presented for further evaluation.  She has remote history of tobacco only smoking a few cigarettes daily for 5 years, quitting in 1992.Marland Kitchen ?In the ED, the patient was afebrile and hemodynamically stable with oxygen saturation 88% on room air.  BMP showed sodium 136, potassium 3.4, bicarbonate 23, BUN 10, creatinine 0.58.  WBC 2.7, hemoglobin 11.3, platelets 49,000.  The patient was started on ceftriaxone and azithromycin. ? ? ?Assessment and Plan: ?* Severe sepsis (HCC) ?Presented with fever, leukopenia, tachypnea, and elevated lactate with respiratory failure ?Secondary to pneumonia ?Lactic acid peaked 2.4>>1.8 ?Follow-up PCT 0.17 ?Ceftriaxone and azithromycin transition to oral antibiotics ?Follow blood cultures--neg ?Sepsis physiology resolved ? ?Lobar pneumonia (HCC) ?Personally reviewed chest x-ray--bibasilar infiltrates, increased  interstitial markings ?--3/12 CTA chest--no PE;  Widespread GGO and airspace consolidation ?Continue ceftriaxone and azithromycin >>po on 3/14  ?Follow-up PCT--0.17 ?continue duoneb, pulmicort ?Started on hycodan for persistent cough ?Since she has had continued wheezing, will give a trial of Solu-Medrol ? ?Acute respiratory failure with hypoxia (HCC) ?Presented with oxygen saturation 88% on room air with tachypnea ?Due to pneumonia and pulm edema ?Stable on 2 L nasal cannula ?Wean oxygen as tolerated for saturation greater 92% ?3/12 CTA chest multifocal areas of GGO and consolidation; no PE ?3/12 and 3/13>>lasix IV ?Echo EF 70-75%, no WMA ?Continue to wean off oxygen as tolerated ?Repeat chest x-ray 3/16 showed improving infiltrates ?Continues to have cough and wheezing, shortness of breath ? ? ?Pulmonary edema ?3/12 CT chest--multifocal GGO and consolidation ?3/13 CT abd--Bibasilar ground-glass opacity, likely pulmonary edema. ?Due to Hepatothorax ?3/13 Echo--EF 70-75%, no WMA, normal diastolic function ?Received IV lasix 3/12, 3/13, 3/14 ?We will start on oral Lasix ? ? ?Cirrhosis of liver with ascites (HCC) ?07/08/2021 CTA chest suggested cirrhotic liver changes on imaging with ascites ?09/19/21 CTA chest>>hepatic cirrhosis with minimal ascites ?Patient has signs of portal hypertension and splenomegaly ?Suspect she has underlying NASH ?Appreciate GI input ?We will start on oral Lasix and potassium for mild ascites/edema ? ?Odynophagia ?Pt states previously saw Dr. Jonette Eva 2013>>EGD with dil ?Now with liquids and solid food ?Consult GI appreciated ?3/14 EGD--grade D esophagitis with bleeding ?protonix bid and sulcrafate ac/hs ?KOH stain negative for fungal process ? ?Thrombocytopenia (HCC) ?Secondary to splenomegaly and underlying liver cirrhosis ?Has been progressive since 2018 ?Follows Dr. Ellin Saba ?BMBX on 04/21/2021 showed slightly hypercellular bone marrow with trilineage hematopoiesis.  Slight  lymphocytosis of primarily small lymphoid cells with lack of significant aggregates.  Chromosome analysis is  normal. ?Monitor for signs of bleeding ? ?Chronic leukopenia ?Secondary to splenomegaly and underlying liver cirrhosis as discussed above ?Follows Dr. Ellin Saba ?BMBX on 04/21/2021 showed slightly hypercellular bone marrow with trilineage hematopoiesis.  Slight lymphocytosis of primarily small lymphoid cells with lack of significant aggregates.  Chromosome analysis is normal. ? ?Hypokalemia ?replete ? ?Hypomagnesemia ?replete ? ?Obesity, Class III, BMI 40-49.9 (morbid obesity) (HCC) ?BMI 47.44 ?Lifestyle modification ? ?Essential hypertension ?Hold losartan since there is concern this may be contributing to her cough ? ?Depression with anxiety ?Continue Prozac ?PDMP reviewed--last alprazolam Rx 11/25/20 ? ?Uncontrolled type 2 diabetes mellitus with hyperglycemia, without long-term current use of insulin (HCC) ?Holding metformin ?NovoLog sliding scale ?09/17/21 hemoglobin A1c--6.0 ? ?Atypical chest pain ?troponins 12>>12 ?Personally reviewed EKG>>sinus, nonspecific T wave change ?Primarily GI etiology ? ? ?GERD (gastroesophageal reflux disease) ?Continue Protonix ? ? ? ? ?  ? ?Subjective: Continues to have cough and feels as though she cannot catch her breath during coughing paroxysms.  Was placed back on oxygen due to hypoxia. ? ?Physical Exam: ?Vitals:  ? 09/23/21 1337 09/23/21 1429 09/23/21 2027 09/23/21 2110  ?BP:  (!) 173/90 123/67   ?Pulse:  93 78   ?Resp:  18 16   ?Temp:  97.9 ?F (36.6 ?C) 98.3 ?F (36.8 ?C)   ?TempSrc:  Oral Oral   ?SpO2: 93% 100% 97% 95%  ?Weight:      ?Height:      ? ?General exam: Alert, awake, oriented x 3 ?Respiratory system: Scattered rhonchi. Respiratory effort normal. ?Cardiovascular system:RRR. No murmurs, rubs, gallops. ?Gastrointestinal system: Abdomen is nondistended, soft and nontender. No organomegaly or masses felt. Normal bowel sounds heard. ?Central nervous system: Alert  and oriented. No focal neurological deficits. ?Extremities: 1+ pitting edema lower extremities bilaterally ?Skin: No rashes, lesions or ulcers ?Psychiatry: Judgement and insight appear normal. Mood & affect appropriate.  ? ?Data Reviewed: ? ?There are no new results to review at this time. ? ?Family Communication: Updated daughter at the bedside ? ?Disposition: ?Status is: Inpatient ?Remains inpatient appropriate because: Continued wheezing and shortness of breath ? Planned Discharge Destination: Home ? ? ? ?Time spent: 35 minutes ? ?Author: ?Erick Blinks, MD ?09/24/2021 12:04 AM ? ?For on call review [email or web address].  ?

## 2021-09-24 NOTE — Progress Notes (Signed)
Inpatient Diabetes Program Recommendations ? ?AACE/ADA: New Consensus Statement on Inpatient Glycemic Control (2015) ? ?Target Ranges:  Prepandial:   less than 140 mg/dL ?     Peak postprandial:   less than 180 mg/dL (1-2 hours) ?     Critically ill patients:  140 - 180 mg/dL  ? ?Lab Results  ?Component Value Date  ? GLUCAP 222 (H) 09/24/2021  ? HGBA1C 6.0 (H) 09/17/2021  ? ? ?Review of Glycemic Control ? Latest Reference Range & Units 09/23/21 20:35 09/24/21 07:32 09/24/21 11:21  ?Glucose-Capillary 70 - 99 mg/dL 273 (H) 182 (H) 222 (H)  ?(H): Data is abnormally high ?Diabetes history: Type 2 DM ?Outpatient Diabetes medications: Metformin 500 mg BID ?Current orders for Inpatient glycemic control: Novolog 0-15 units TID & HS ?Solumedrol 40 mg BID ? ?Inpatient Diabetes Program Recommendations:   ? ?If remain on steroids consider adding Novolog 3 units TID (Assuming patient is consuming >50% of meals).  ? ?Thanks, ?Bronson Curb, MSN, RNC-OB ?Diabetes Coordinator ?[phone] (8a-5p) ? ? ? ?

## 2021-09-24 NOTE — Progress Notes (Addendum)
?Progress Note ? ? ?Patient: Candice Stanley IHK:[id number] DOB: 02-24-1966 DOA: 09/17/2021     6 ?DOS: the patient was seen and examined on 09/24/2021 ?  ?Brief hospital course: ?56 year old female with a history of anxiety/depression, hypertension, hyperlipidemia, diabetes mellitus type 2, thrombocytopenia and leukopenia presenting with 2-day history of generalized weakness, worsening shortness of breath and cough with brown sputum.  The patient states that she has been coughing to the point of having some posttussive emesis.  She denies any fevers, chills, chest pain, headache, neck pain, nausea, vomiting, diarrhea, abdominal pain.  She did have a small amount of blood-tinged sputum associated with an incessant cough.  She denied any worsening lower extremity edema orthopnea type symptoms.  She denies any anginal type symptoms or worsening shortness of breath with activity.  She went to urgent care on the morning of admission, she was given a prescription for Augmentin, azithromycin, and prednisone.  She states that she had 1 dose of the medications without improvement.  As result she presented for further evaluation.  She has remote history of tobacco only smoking a few cigarettes daily for 5 years, quitting in 1992.Marland Kitchen ?In the ED, the patient was afebrile and hemodynamically stable with oxygen saturation 88% on room air.  BMP showed sodium 136, potassium 3.4, bicarbonate 23, BUN 10, creatinine 0.58.  WBC 2.7, hemoglobin 11.3, platelets 49,000.  The patient was started on ceftriaxone and azithromycin. ? ? ?Assessment and Plan: ?* Severe sepsis (HCC) ?Presented with fever, leukopenia, tachypnea, and elevated lactate with respiratory failure ?Secondary to pneumonia ?Lactic acid peaked 2.4>>1.8 ?Follow-up PCT 0.17 ?Ceftriaxone and azithromycin transitioned to oral antibiotics ?Follow blood cultures--neg ?Sepsis physiology resolved ? ?Lobar pneumonia (HCC) ?Personally reviewed chest x-ray--bibasilar infiltrates, increased  interstitial markings ?--3/12 CTA chest--no PE;  Widespread GGO and airspace consolidation ?Initially started on ceftriaxone and azithromycin and subsequently transition to p.o. ?She was completed a course of antibiotics in the hospital ?Follow-up PCT--0.17 ?continue duoneb, pulmicort ?Started on hycodan for persistent cough ?Overall wheezing appears to have improved, will discontinue further steroids ? ?Acute respiratory failure with hypoxia (HCC) ?Presented with oxygen saturation 88% on room air with tachypnea ?Due to pneumonia and pulm edema ?Stable on 2 L nasal cannula ?Wean oxygen as tolerated for saturation greater 92% ?3/12 CTA chest multifocal areas of GGO and consolidation; no PE ?3/12 and 3/13>>lasix IV ?Echo EF 70-75%, no WMA ?Continue to wean off oxygen as tolerated ?Repeat chest x-ray 3/16 showed improving infiltrates ?Overall cough improving ?-We will try and wean off oxygen as tolerated ? ? ?Acute pulmonary edema (HCC) ?3/12 CT chest--multifocal GGO and consolidation ?3/13 CT abd--Bibasilar ground-glass opacity, likely pulmonary edema. ?Due to Hepatothorax ?3/13 Echo--EF 70-75%, no WMA, normal diastolic function ?Received IV lasix 3/12, 3/13, 3/14 ?Continue on oral Lasix ? ? ?Cirrhosis of liver with ascites (HCC) ?07/08/2021 CTA chest suggested cirrhotic liver changes on imaging with ascites ?09/19/21 CTA chest>>hepatic cirrhosis with minimal ascites ?Patient has signs of portal hypertension and splenomegaly ?Suspect she has underlying NASH ?Appreciate GI input ?Continue on oral Lasix and potassium for mild ascites/edema ?Ammonia checked and noted to be mildly elevated at 62.  She does not have any asterixis ?Started on lactulose ? ?Odynophagia ?Pt states previously saw Dr. Jonette Eva 2013>>EGD with dil ?Now with liquids and solid food ?Consult GI appreciated ?3/14 EGD--grade D esophagitis with bleeding ?protonix bid and sulcrafate ac/hs ?KOH stain negative for fungal process ? ?Thrombocytopenia  (HCC) ?Secondary to splenomegaly and underlying liver cirrhosis ?Has been progressive since  2018 ?Follows Dr. Ellin Saba ?BMBX on 04/21/2021 showed slightly hypercellular bone marrow with trilineage hematopoiesis.  Slight lymphocytosis of primarily small lymphoid cells with lack of significant aggregates.  Chromosome analysis is normal. ?Monitor for signs of bleeding ? ?Chronic leukopenia ?Secondary to splenomegaly and underlying liver cirrhosis as discussed above ?Follows Dr. Ellin Saba ?BMBX on 04/21/2021 showed slightly hypercellular bone marrow with trilineage hematopoiesis.  Slight lymphocytosis of primarily small lymphoid cells with lack of significant aggregates.  Chromosome analysis is normal. ? ?Hypokalemia ?replete ? ?Pancytopenia (HCC) ?Related to underlying liver disease ?Stable ? ?Hypomagnesemia ?replete ? ?Obesity, Class III, BMI 40-49.9 (morbid obesity) (HCC) ?BMI 47.44 ?Lifestyle modification ? ?Essential hypertension ?Hold losartan since there is concern this may be contributing to her cough ? ?Depression with anxiety ?Continue Prozac ?PDMP reviewed--last alprazolam Rx 11/25/20 ? ?Uncontrolled type 2 diabetes mellitus with hyperglycemia, without long-term current use of insulin (HCC) ?Holding metformin ?NovoLog sliding scale ?09/17/21 hemoglobin A1c--6.0 ? ?Atypical chest pain ?troponins 12>>12 ?Personally reviewed EKG>>sinus, nonspecific T wave change ?Primarily GI etiology ? ? ?GERD (gastroesophageal reflux disease) ?Continue Protonix ? ? ? ? ?  ? ?Subjective: She says she is feeling better today.  Overall cough is improving.  Less productive.  She is still requiring oxygen.  She was ambulated today, but reports she was coughing through the entire time that she was walking.  She has been having infrequent bowel movements. ? ?Physical Exam: ?Vitals:  ? 09/24/21 0812 09/24/21 0854 09/24/21 1252 09/24/21 1435  ?BP:   (!) 114/52   ?Pulse:   79   ?Resp:   18   ?Temp:   97.7 ?F (36.5 ?C)   ?TempSrc:    Oral   ?SpO2: 100% 92% 95% 91%  ?Weight:      ?Height:      ? ?General exam: Alert, awake, oriented x 3 ?Respiratory system: Clear to auscultation. Respiratory effort normal. ?Cardiovascular system:RRR. No murmurs, rubs, gallops. ?Gastrointestinal system: Abdomen is nondistended, soft and nontender. No organomegaly or masses felt. Normal bowel sounds heard. ?Central nervous system: Alert and oriented. No focal neurological deficits. ?Extremities: No C/C/E, +pedal pulses ?Skin: No rashes, lesions or ulcers ?Psychiatry: Judgement and insight appear normal. Mood & affect appropriate.  ? ?Data Reviewed: ? ?Reviewed CBC, chemistry, INR ? ?Family Communication: Discussed with daughter at the bedside ? ?Disposition: ?Status is: Inpatient ?Remains inpatient appropriate because: Still requiring supplemental oxygen, will try and wean off ? Planned Discharge Destination: Home ? ? ? ?Time spent: 35 minutes ? ?Author: ?Erick Blinks, MD ?09/24/2021 8:59 PM ? ?For on call review [email or web address].  ?

## 2021-09-24 NOTE — TOC Progression Note (Signed)
Transition of Care (TOC) - Progression Note  ? ? ?Patient Details  ?Name: Candice Stanley ?MRN: [id number] ?Date of Birth: 19-Sep-1965 ? ?Transition of Care (TOC) CM/SW Contact  ?Shade Flood, LCSW ?Phone Number: ?09/24/2021, 4:05 PM ? ?Clinical Narrative:    ? ?TOC following. MD anticipating weekend dc. Spoke with pt to review dc medication concerns. MD indicated pt would have several new medications and pt does not currently have medical insurance. Offered MATCH voucher to patient who is accepting. Also provided information on [facility] for follow up care and on-going medication assistance. Weekend TOC will be available if other dc needs arise. ? ?Expected Discharge Plan: Home/Self Care ?Barriers to Discharge: Continued Medical Work up ? ?Expected Discharge Plan and Services ?Expected Discharge Plan: Home/Self Care ?  ?  ?  ?Living arrangements for the past 2 months: [place] ?                ?  ?  ?  ?  ?  ?  ?  ?  ?  ?  ? ? ?Social Determinants of Health (SDOH) Interventions ?  ? ?Readmission Risk Interventions ?Readmission Risk Prevention Plan 09/20/2021  ?Transportation Screening Complete  ?[place] or Home Care Consult Complete  ?Palliative Care Screening Not Applicable  ?Medication Review Press photographer) Complete  ?Some recent data might be hidden  ? ? ?

## 2021-09-25 DIAGNOSIS — B3781 Candidal esophagitis: Secondary | ICD-10-CM

## 2021-09-25 DIAGNOSIS — K7460 Unspecified cirrhosis of liver: Secondary | ICD-10-CM

## 2021-09-25 DIAGNOSIS — K766 Portal hypertension: Secondary | ICD-10-CM

## 2021-09-25 DIAGNOSIS — K3189 Other diseases of stomach and duodenum: Secondary | ICD-10-CM

## 2021-09-25 DIAGNOSIS — J810 Acute pulmonary edema: Secondary | ICD-10-CM

## 2021-09-25 DIAGNOSIS — K2101 Gastro-esophageal reflux disease with esophagitis, with bleeding: Secondary | ICD-10-CM

## 2021-09-25 LAB — GLUCOSE, CAPILLARY
Glucose-Capillary: 111 mg/dL — ABNORMAL HIGH (ref 70–99)
Glucose-Capillary: 142 mg/dL — ABNORMAL HIGH (ref 70–99)

## 2021-09-25 LAB — AFP TUMOR MARKER: AFP, Serum, Tumor Marker: 3.1 ng/mL (ref 0.0–9.2)

## 2021-09-25 MED ORDER — LACTULOSE 10 GM/15ML PO SOLN: 20.0000 g | Freq: Three times a day (TID) | ORAL | 0 refills | Status: DC

## 2021-09-25 MED ORDER — DM-GUAIFENESIN ER 30-600 MG PO TB12: 1.0000 | ORAL_TABLET | Freq: Two times a day (BID) | ORAL | 0 refills | Status: DC

## 2021-09-25 MED ORDER — POTASSIUM CHLORIDE CRYS ER 20 MEQ PO TBCR: 20.0000 meq | EXTENDED_RELEASE_TABLET | Freq: Every day | ORAL | 0 refills | Status: DC

## 2021-09-25 MED ORDER — SUCRALFATE 1 GM/10ML PO SUSP: 1.0000 g | Freq: Three times a day (TID) | ORAL | 0 refills | Status: DC

## 2021-09-25 MED ORDER — PANTOPRAZOLE SODIUM 40 MG PO TBEC
40.0000 mg | DELAYED_RELEASE_TABLET | Freq: Two times a day (BID) | ORAL | 2 refills | Status: DC
Start: 2021-09-25 — End: 2022-09-05

## 2021-09-25 MED ORDER — FUROSEMIDE 20 MG PO TABS: 20.0000 mg | ORAL_TABLET | Freq: Every day | ORAL | 0 refills | Status: DC | PRN

## 2021-09-25 NOTE — Discharge Summary (Signed)
?Physician Discharge Summary ?  ?Patient: Candice Stanley MRN: [id number] DOB: 1965/10/04  ?Admit date:     09/17/2021  ?Discharge date: 09/25/21  ?Discharge Physician: Erick BlinksJehanzeb Kira Hartl  ? ?PCP: Benita StabileHall, John Z, MD  ? ?Recommendations at discharge:  ? ? Follow up with GI for further cirrhosis care ?Follow up with pcp in 1-2 weeks ?Repeat chest xray in 3-4 weeks to ensure resolution of pneumonia ? ?Discharge Diagnoses: ?Principal Problem: ?  Severe sepsis (HCC) ?Active Problems: ?  Lobar pneumonia (HCC) ?  Acute respiratory failure with hypoxia (HCC) ?  Acute pulmonary edema (HCC) ?  Odynophagia ?  Cirrhosis of liver with ascites (HCC) ?  Chronic leukopenia ?  Thrombocytopenia (HCC) ?  Hypokalemia ?  GERD (gastroesophageal reflux disease) ?  Atypical chest pain ?  Uncontrolled type 2 diabetes mellitus with hyperglycemia, without long-term current use of insulin (HCC) ?  Depression with anxiety ?  Essential hypertension ?  Obesity, Class III, BMI 40-49.9 (morbid obesity) (HCC) ?  Hypomagnesemia ?  Acute esophagitis ?  Pancytopenia (HCC) ? ?Resolved Problems: ?  * No resolved hospital problems. * ? ?Hospital Course: ?56 year old female with a history of anxiety/depression, hypertension, hyperlipidemia, diabetes mellitus type 2, thrombocytopenia and leukopenia presenting with 2-day history of generalized weakness, worsening shortness of breath and cough with brown sputum.  The patient states that she has been coughing to the point of having some posttussive emesis.  She denies any fevers, chills, chest pain, headache, neck pain, nausea, vomiting, diarrhea, abdominal pain.  She did have a small amount of blood-tinged sputum associated with an incessant cough.  She denied any worsening lower extremity edema orthopnea type symptoms.  She denies any anginal type symptoms or worsening shortness of breath with activity.  She went to urgent care on the morning of admission, she was given a prescription for Augmentin, azithromycin, and  prednisone.  She states that she had 1 dose of the medications without improvement.  As result she presented for further evaluation.  She has remote history of tobacco only smoking a few cigarettes daily for 5 years, quitting in 1992.Marland Kitchen. ?In the ED, the patient was afebrile and hemodynamically stable with oxygen saturation 88% on room air.  BMP showed sodium 136, potassium 3.4, bicarbonate 23, BUN 10, creatinine 0.58.  WBC 2.7, hemoglobin 11.3, platelets 49,000.  The patient was started on ceftriaxone and azithromycin. ? ? ?Assessment and Plan: ?* Severe sepsis (HCC) ?Presented with fever, leukopenia, tachypnea, and elevated lactate with respiratory failure ?Secondary to pneumonia ?Lactic acid peaked 2.4>>1.8 ?Follow-up PCT 0.17 ?Ceftriaxone and azithromycin transitioned to oral antibiotics ?Follow blood cultures--neg ?Sepsis physiology resolved ? ?Lobar pneumonia (HCC) ?Personally reviewed chest x-ray--bibasilar infiltrates, increased interstitial markings ?--3/12 CTA chest--no PE;  Widespread GGO and airspace consolidation ?Initially started on ceftriaxone and azithromycin and subsequently transition to p.o. ?She has completed a course of antibiotics in the hospital ?Started on hycodan for persistent cough ?Overall wheezing appears to have improved, will discontinue further steroids ?Will need repeat chest xray in 3-4 weeks ? ?Acute respiratory failure with hypoxia (HCC) ?Presented with oxygen saturation 88% on room air with tachypnea ?Due to pneumonia and pulm edema ?Stable on 2 L nasal cannula ?Wean oxygen as tolerated for saturation greater 92% ?3/12 CTA chest multifocal areas of GGO and consolidation; no PE ?3/12 and 3/13>>lasix IV ?Echo EF 70-75%, no WMA ?Continue to wean off oxygen as tolerated ?Repeat chest x-ray 3/16 showed improving infiltrates ?Overall cough improving ?-she was weaned off oxygen prior to discharge  and was able to ambulate on room air. ? ? ?Acute pulmonary edema (HCC) ?3/12 CT  chest--multifocal GGO and consolidation ?3/13 CT abd--Bibasilar ground-glass opacity, likely pulmonary edema. ?Due to Hepatothorax ?3/13 Echo--EF 70-75%, no WMA, normal diastolic function ?Received IV lasix 3/12, 3/13, 3/14 ?Continue on oral Lasix ? ? ?Cirrhosis of liver with ascites (HCC) ?07/08/2021 CTA chest suggested cirrhotic liver changes on imaging with ascites ?09/19/21 CTA chest>>hepatic cirrhosis with minimal ascites ?Patient has signs of portal hypertension and splenomegaly ?Suspect she has underlying NASH ?Appreciate GI input ?Continue on oral Lasix and potassium for mild ascites/edema ?Ammonia checked and noted to be mildly elevated at 62.  She does not have any asterixis ?Started on lactulose ? ?Odynophagia ?Pt states previously saw Dr. Jonette Eva 2013>>EGD with dil ?Now with liquids and solid food ?Consult GI appreciated ?3/14 EGD--grade D esophagitis with bleeding ?protonix bid and sulcrafate ac/hs ?KOH stain negative for fungal process ? ?Thrombocytopenia (HCC) ?Secondary to splenomegaly and underlying liver cirrhosis ?Has been progressive since 2018 ?Follows Dr. Ellin Saba ?BMBX on 04/21/2021 showed slightly hypercellular bone marrow with trilineage hematopoiesis.  Slight lymphocytosis of primarily small lymphoid cells with lack of significant aggregates.  Chromosome analysis is normal. ?Monitor for signs of bleeding ? ?Chronic leukopenia ?Secondary to splenomegaly and underlying liver cirrhosis as discussed above ?Follows Dr. Ellin Saba ?BMBX on 04/21/2021 showed slightly hypercellular bone marrow with trilineage hematopoiesis.  Slight lymphocytosis of primarily small lymphoid cells with lack of significant aggregates.  Chromosome analysis is normal. ? ?Hypokalemia ?replete ? ?Pancytopenia (HCC) ?Related to underlying liver disease ?Stable ? ?Hypomagnesemia ?replete ? ?Obesity, Class III, BMI 40-49.9 (morbid obesity) (HCC) ?BMI 47.44 ?Lifestyle modification ? ?Essential hypertension ?Hold  losartan since there is concern this may be contributing to her cough ? ?Depression with anxiety ?Continue Prozac ?PDMP reviewed--last alprazolam Rx 11/25/20 ? ?Uncontrolled type 2 diabetes mellitus with hyperglycemia, without long-term current use of insulin (HCC) ?Holding metformin ?NovoLog sliding scale ?09/17/21 hemoglobin A1c--6.0 ? ?Atypical chest pain ?troponins 12>>12 ?Personally reviewed EKG>>sinus, nonspecific T wave change ?Primarily GI etiology ? ? ?GERD (gastroesophageal reflux disease) ?Continue Protonix ? ? ? ? ?  ? ? ?Consultants: GI ?Procedures performed: EGD  ?Disposition: Home ?Diet recommendation:  ?Discharge Diet Orders (From admission, onward)  ? ?  Start     Ordered  ? 09/25/21 0000  Diet - low sodium heart healthy       ? 09/25/21 1141  ? ?  ?  ? ?  ? ?Cardiac and Carb modified diet ?DISCHARGE MEDICATION: ?Allergies as of 09/25/2021   ? ?   Reactions  ? Codeine Nausea And Vomiting  ? Dilaudid [hydromorphone Hcl]   ? Drop in blood pressure  ? Tape Swelling, Other (See Comments)  ? Rash(paper tape)  ? Vicodin [hydrocodone-acetaminophen] Nausea And Vomiting  ? Latex Rash  ? ?  ? ?  ?Medication List  ?  ? ?STOP taking these medications   ? ?amoxicillin-clavulanate 875-125 MG tablet ?Commonly known as: AUGMENTIN ?  ?azithromycin 250 MG tablet ?Commonly known as: Zithromax Z-Pak ?  ?benzonatate 100 MG capsule ?Commonly known as: TESSALON ?  ?dexlansoprazole 60 MG capsule ?Commonly known as: Dexilant ?  ?GOODY HEADACHE PO ?  ?HYDROcodone-acetaminophen 5-325 MG tablet ?Commonly known as: NORCO/VICODIN ?  ?levocetirizine 5 MG tablet ?Commonly known as: XYZAL ?  ?losartan 25 MG tablet ?Commonly known as: COZAAR ?  ?predniSONE 50 MG tablet ?Commonly known as: DELTASONE ?  ?traZODone 100 MG tablet ?Commonly known as: DESYREL ?  ?ZINC PO ?  ? ?  ? ?  TAKE these medications   ? ?albuterol 108 (90 Base) MCG/ACT inhaler ?Commonly known as: VENTOLIN HFA ?Inhale 1-2 puffs into the lungs every 6 (six) hours as  needed for wheezing or shortness of breath. ?  ?ALPRAZolam 0.5 MG tablet ?Commonly known as: Prudy Feeler ?Take 0.25-0.5 mg by mouth daily as needed for anxiety. ?  ?dextromethorphan-guaiFENesin 30-600 MG 12hr tablet ?Commonly known

## 2021-09-25 NOTE — Progress Notes (Addendum)
Patient ambulated in the hallway oxygen saturation dropped to 86-87% on Room air while patient was having coughing episode. Oxygen saturation increased to 89-90% on Room air after coughing episode. Patient reports no complaints of SOB. MD Memon made aware.  ?

## 2021-09-25 NOTE — Progress Notes (Signed)
Subjective ? ?Patient feels much better.  Cough has improved a great deal.  She is not having pain on swallowing.  She says she did experience bloating and cramping last night after she took second dose of lactulose.  She had 2 bowel movements yesterday and she is already had 1 this morning it was soft stool.  No melena or rectal bleeding. ? ?Current Medications: ? ?Current Facility-Administered Medications:  ?  acetaminophen (TYLENOL) tablet 650 mg, 650 mg, Oral, Q6H PRN, Adefeso, Oladapo, DO, 650 mg at 09/22/21 0030 ?  ALPRAZolam Duanne Moron) tablet 0.25 mg, 0.25 mg, Oral, QID PRN, Tat, David, MD, 0.25 mg at 09/21/21 2138 ?  alum & mag hydroxide-simeth (MAALOX/MYLANTA) 200-200-20 MG/5ML suspension 30 mL, 30 mL, Oral, Q4H PRN, Tat, David, MD, 30 mL at 09/20/21 1323 ?  ascorbic acid (VITAMIN C) tablet 500 mg, 500 mg, Oral, BID, Tat, David, MD, 500 mg at 09/25/21 2563 ?  budesonide (PULMICORT) nebulizer solution 0.5 mg, 0.5 mg, Nebulization, BID, Tat, David, MD, 0.5 mg at 09/25/21 8937 ?  calcium carbonate (TUMS - dosed in mg elemental calcium) chewable tablet 400 mg of elemental calcium, 2 tablet, Oral, BID PRN, Tat, David, MD, 400 mg of elemental calcium at 09/23/21 1034 ?  dextromethorphan-guaiFENesin (MUCINEX DM) 30-600 MG per 12 hr tablet 1 tablet, 1 tablet, Oral, BID, Adefeso, Oladapo, DO, 1 tablet at 09/23/21 2138 ?  FLUoxetine (PROZAC) capsule 40 mg, 40 mg, Oral, Daily, Adefeso, Oladapo, DO, 40 mg at 09/25/21 3428 ?  furosemide (LASIX) tablet 20 mg, 20 mg, Oral, Daily, Memon, Jolaine Artist, MD, 20 mg at 09/25/21 0917 ?  guaiFENesin-dextromethorphan (ROBITUSSIN DM) 100-10 MG/5ML syrup 5 mL, 5 mL, Oral, Q4H PRN, Adefeso, Oladapo, DO, 5 mL at 09/21/21 2119 ?  HYDROcodone bit-homatropine (HYCODAN) 5-1.5 MG/5ML syrup 5 mL, 5 mL, Oral, Q6H PRN, Kathie Dike, MD, 5 mL at 09/25/21 0129 ?  insulin aspart (novoLOG) injection 0-15 Units, 0-15 Units, Subcutaneous, TID WC, Adefeso, Oladapo, DO, 8 Units at 09/24/21 1715 ?   insulin aspart (novoLOG) injection 0-5 Units, 0-5 Units, Subcutaneous, QHS, Adefeso, Oladapo, DO, 3 Units at 09/23/21 2142 ?  ipratropium-albuterol (DUONEB) 0.5-2.5 (3) MG/3ML nebulizer solution 3 mL, 3 mL, Nebulization, Q4H PRN, Adefeso, Oladapo, DO, 3 mL at 09/20/21 0022 ?  ipratropium-albuterol (DUONEB) 0.5-2.5 (3) MG/3ML nebulizer solution 3 mL, 3 mL, Nebulization, TID, Tat, David, MD, 3 mL at 09/25/21 0738 ?  lactulose (CHRONULAC) 10 GM/15ML solution 20 g, 20 g, Oral, TID, Memon, Jehanzeb, MD, 20 g at 09/25/21 7681 ?  loratadine (CLARITIN) tablet 10 mg, 10 mg, Oral, Daily, Tat, David, MD, 10 mg at 09/25/21 1572 ?  magic mouthwash w/lidocaine, 10 mL, Oral, QID, Tat, David, MD, 10 mL at 09/25/21 0915 ?  menthol-cetylpyridinium (CEPACOL) lozenge 3 mg, 1 lozenge, Oral, PRN, Tat, David, MD, 3 mg at 09/18/21 1025 ?  pantoprazole (PROTONIX) injection 40 mg, 40 mg, Intravenous, Q12H, Tat, Shanon Brow, MD, 40 mg at 09/25/21 0915 ?  pneumococcal 20-valent conjugate vaccine (PREVNAR 20) injection 0.5 mL, 0.5 mL, Intramuscular, Tomorrow-1000, Adefeso, Oladapo, DO ?  potassium chloride (KLOR-CON) packet 20 mEq, 20 mEq, Oral, Daily, Memon, Jolaine Artist, MD, 20 mEq at 09/25/21 0917 ?  sodium chloride HYPERTONIC 3 % nebulizer solution 4 mL, 4 mL, Nebulization, BID, Kathie Dike, MD, 4 mL at 09/25/21 0738 ?  sucralfate (CARAFATE) 1 GM/10ML suspension 1 g, 1 g, Oral, TID WC & HS, Carver, Charles K, DO, 1 g at 09/25/21 6203 ?  zinc sulfate capsule 220 mg, 220 mg,  Oral, Daily, Tat, David, MD, 220 mg at 09/25/21 0940 ? ? ?Objective: ?Blood pressure 133/70, pulse 75, temperature 98.2 ?F (36.8 ?C), temperature source Oral, resp. rate 18, height '5\' 4"'  (1.626 m), weight 125.4 kg, SpO2 91 %. ?Patient is alert and in no acute distress. ?Asterixis absent. ?She has trace pitting edema around right ankle but none in left leg. ? ?Labs/studies Results: ? ? ?CBC Latest Ref Rng & Units 09/24/2021 09/22/2021 09/21/2021  ?WBC 4.0 - 10.5 K/uL 2.3(L) 2.2(L)  2.3(L)  ?Hemoglobin 12.0 - 15.0 g/dL 9.8(L) 10.3(L) 10.2(L)  ?Hematocrit 36.0 - 46.0 % 32.1(L) 32.7(L) 32.6(L)  ?Platelets 150 - 400 K/uL 51(L) 47(L) 48(L)  ?  ?CMP Latest Ref Rng & Units 09/24/2021 09/23/2021 09/22/2021  ?Glucose 70 - 99 mg/dL 207(H) 189(H) 117(H)  ?BUN 6 - 20 mg/dL '11 9 11  ' ?Creatinine 0.44 - 1.00 mg/dL 0.55 0.63 0.53  ?Sodium 135 - 145 mmol/L 137 138 140  ?Potassium 3.5 - 5.1 mmol/L 4.5 3.5 3.6  ?Chloride 98 - 111 mmol/L 105 103 104  ?CO2 22 - 32 mmol/L '26 25 28  ' ?Calcium 8.9 - 10.3 mg/dL 9.1 8.8(L) 9.0  ?Total Protein 6.5 - 8.1 g/dL 5.9(L) 5.9(L) 5.7(L)  ?Total Bilirubin 0.3 - 1.2 mg/dL 1.1 1.3(H) 1.5(H)  ?Alkaline Phos 38 - 126 U/L 99 105 98  ?AST 15 - 41 U/L 43(H) 53(H) 42(H)  ?ALT 0 - 44 U/L '22 22 21  ' ?  ?Hepatic Function Latest Ref Rng & Units 09/24/2021 09/23/2021 09/22/2021  ?Total Protein 6.5 - 8.1 g/dL 5.9(L) 5.9(L) 5.7(L)  ?Albumin 3.5 - 5.0 g/dL 2.6(L) 2.7(L) 2.6(L)  ?AST 15 - 41 U/L 43(H) 53(H) 42(H)  ?ALT 0 - 44 U/L '22 22 21  ' ?Alk Phosphatase 38 - 126 U/L 99 105 98  ?Total Bilirubin 0.3 - 1.2 mg/dL 1.1 1.3(H) 1.5(H)  ?Bilirubin, Direct 0.0 - 0.2 mg/dL - - -  ?  ? ? ?Assessment: ? ?#1.  Ulcerative reflux esophagitis.  She finally seems to have responded to therapy.  She is presently on double dose PPI and sucralfate.  Patient also advised to stay upright for at least 2 hours after each meal. ? ?#2.  Cirrhosis secondary to NASH complicated by minimal ascites and mild hepatic encephalopathy.  Her bowels are moving while on lactulose. ?Chronic leukopenia and thrombocytopenia secondary to cirrhosis. ? ? ?#3.  Acute respiratory failure secondary to lobar pneumonia most likely secondary to aspiration.  She is now maintaining her O2 sat above 90 on room air. ? ?#4.  Morbid obesity.  Patient appears to be very committed and determined to lose weight by decreasing calorie intake and increasing physical activity.  Apparently she is already lost 14 pounds in the last few  weeks. ? ?Recommendations ? ?Stable for discharge from GI standpoint. ?Patient has been advised to get hepatitis A and B vaccination either by her PCP or at [facility]. ?Patient advised to titrate lactulose dose.  Goal is for her to have 3 bowel movements per day. ?She will undergo esophagogastroduodenoscopy in 3 months to document healing of severe reflux esophagitis. ?We will arrange for office visit with Dr. Abbey Chatters in 3 to 4 weeks. ? ? ? ? ? ?

## 2021-09-25 NOTE — Progress Notes (Signed)
Patient discharged home today, transported home by family. Discharge paperwork went over with patient, patient verbalized understanding. Belongings sent home with patient.  ?

## 2021-09-27 ENCOUNTER — Telehealth (HOSPITAL_COMMUNITY): Payer: Self-pay | Admitting: *Deleted

## 2021-09-27 NOTE — Telephone Encounter (Signed)
Candice Stanley, please schedule OV per Dr. Marletta Lor. ?

## 2021-09-27 NOTE — Telephone Encounter (Signed)
[name], do you think she can be triaged? ?

## 2021-09-27 NOTE — Telephone Encounter (Signed)
She needs OV for cirrhosis mgmt. We can schedule her for EGD at that time. Okay to schedule with APP or myself. Thanks  ?

## 2021-09-27 NOTE — Telephone Encounter (Signed)
Call made to patient by Amy Nance to reschedule appointment with Dr. Ellin Saba.  Detailed message left for patient to return call. ?

## 2021-09-27 NOTE — Telephone Encounter (Signed)
Dr. Abbey Chatters:  Is pt ok for me to triage or does she need to come in for ov first? ?

## 2021-10-19 ENCOUNTER — Encounter: Payer: Self-pay | Admitting: Gastroenterology

## 2021-10-19 NOTE — Progress Notes (Signed)
? ? ?Referring Provider: Benita Stabile, MD ?Primary Care Physician:  Benita Stabile, MD ?Primary GI Physician: Dr. Marletta Lor ? ?Chief Complaint  ?Patient presents with  ? Follow-up  ? ? ?HPI:   ?Candice Stanley is a 56 y.o. female presenting today for hospital follow-up.  ? ?She was admitted in March 2023 after presenting with dysphagia and cough, found to be in acute respiratory failure due to bilateral pneumonia.  GI was consulted for odynophagia/dysphagia/epigastric pain.  She reported taking Dexilant daily and Pepcid at night with occasional ibuprofen and Goody powders at home.  CT A/P with contrast after admission revealed hepatic nodularity with splenomegaly consistent with portal hypertension.  EGD was completed on 3/14 revealing grade D esophagitis with scant bleeding, KOH negative, could not rule out small varices, but no medium to large varices, mild portal hypertensive gastropathy.  Pathology showed mild foveolar hyperplasia, negative for H. pylori.  Recommended repeat EGD in 3 months.  She was also started on carafate and a short course of Reglan for suspected gastroparesis contributing to severity of esophagitis.  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  No significant alcohol history.  Work-up was negative for acute hepatitis, chronic hep C, negative ANA, normal IgG, hemochromatosis, ASMA. Hep A Ab negative.  Previously hep B surface antibody negative in September 2022. MELD was 13. She had mildly elevated ammonia and mild lethargy. Due to lethargy, Reglan was discontinued and she was started lactulose.  ? ?Today:  ? ?Cirrhosis:  ?Denies peripheral edema, abdominal distention, mental status changes, yellowing of the eyes.  Admits to chronic easy bruising, but no bleeding.  Reports she is very serious about losing weight.  She is working on dietary changes and her primary care provider is discussing a couple of medication options with her.  She  wants to discuss them with me as well, but cannot remember their names and will call back when she gets home or next week. ? ?She is only taking lactulose as needed as she usually has at least 2 bowel movements every day.  If she notices that she has not had 2 bowel movements, she will go ahead and take a dose of lactulose.  No BRBPR or melena.  ? ? ?GERD:  ?Reflux has been doing well since hospital discharge.  Reports she is only had indigestion twice.  She has been very careful with her diet. Not sure if she is taking Protonix.  Thinks she may be taking it once a day rather than twice a day, but is also taking omeprazole over-the-counter twice a day.  She has discontinued ibuprofen and Goody powders. ? ?Denies dysphagia, nausea, vomiting, abdominal pain. ? ?No prior colonoscopy.  ? ?Past Medical History:  ?Diagnosis Date  ? Anxiety   ? Cirrhosis (HCC)   ? Depression   ? GERD (gastroesophageal reflux disease)   ? with grade d esophagitis in March 2023.  ? Hypercholesteremia   ? Hypertension   ? Hypothyroidism   ? Trimalleolar fracture of ankle, closed, right, sequela   ? Type 2 diabetes mellitus (HCC)   ? ? ?Past Surgical History:  ?Procedure Laterality Date  ? ABDOMINAL HYSTERECTOMY    ? BIOPSY  09/21/2021  ? Procedure: BIOPSY;  Surgeon: Dolores Frame, MD;  [facility];  Service: Gastroenterology;;  ? CARDIAC CATHETERIZATION N/A 09/10/2015  ? Procedure: Left Heart Cath and Coronary Angiography;  Surgeon: Corky Crafts, MD;  Location: [facility] INVASIVE  CV LAB;  Service: Cardiovascular;  Laterality: N/A;  ? CHOLECYSTECTOMY    ? ESOPHAGEAL BRUSHING  09/21/2021  ? Procedure: ESOPHAGEAL BRUSHING;  Surgeon: Marguerita Merles, Reuel Boom, MD;  [facility];  Service: Gastroenterology;;  ? ESOPHAGOGASTRODUODENOSCOPY (EGD) WITH PROPOFOL N/A 09/21/2021  ? Surgeon: Marguerita Merles, Reuel Boom, MD;  grade D esophagitis with scant bleeding, KOH negative, could not rule out small varices, but no  medium to large varices, mild portal hypertensive gastropathy.  Pathology showed mild foveolar hyperplasia, negative for H. pylori.  Recommended repeat EGD in 3 months.  ? OPEN REDUCTION INTERNAL FIXATION (ORIF) TIBIA/FIBULA FRACTURE Right 08/04/2016  ? Procedure: OPEN REDUCTION INTERNAL FIXATION (ORIF) RIGHT TRIMALLEOLAR FRACTURE;  Surgeon: Toni Arthurs, MD;  Location: [facility];  Service: Orthopedics;  Laterality: Right;  ? WRIST SURGERY    ? ? ?Current Outpatient Medications  ?Medication Sig Dispense Refill  ? albuterol (VENTOLIN HFA) 108 (90 Base) MCG/ACT inhaler Inhale 1-2 puffs into the lungs every 6 (six) hours as needed for wheezing or shortness of breath. 18 g 0  ? Cholecalciferol (VITAMIN D3 PO) Take 1 capsule by mouth daily.    ? ELDERBERRY PO Take 1 tablet by mouth daily.    ? famotidine (PEPCID) 20 MG tablet Take 20 mg by mouth at bedtime.    ? FLUoxetine (PROZAC) 40 MG capsule Take 40 mg by mouth daily.    ? lactulose (CHRONULAC) 10 GM/15ML solution Take 30 mLs (20 g total) by mouth 3 (three) times daily. (Patient taking differently: Take 20 g by mouth 3 (three) times daily. Taking daily as needed.) 236 mL 0  ? loratadine (CLARITIN) 10 MG tablet Take 10 mg by mouth daily.    ? metFORMIN (GLUCOPHAGE) 500 MG tablet Take by mouth 2 (two) times daily with a meal.    ? pantoprazole (PROTONIX) 40 MG tablet Take 1 tablet (40 mg total) by mouth 2 (two) times daily before a meal. 60 tablet 2  ? vitamin C (ASCORBIC ACID) 500 MG tablet Take 500 mg by mouth daily.     ? zinc gluconate 50 MG tablet Take 50 mg by mouth daily.    ? fluticasone (FLOVENT HFA) 110 MCG/ACT inhaler Inhale 2 puffs into the lungs in the morning and at bedtime for 10 days. (Patient not taking: Reported on 09/18/2021) 1 each 12  ? ?No current facility-administered medications for this visit.  ? ? ?Allergies as of 10/21/2021 - Review Complete 10/21/2021  ?Allergen Reaction Noted  ? Codeine Nausea And Vomiting 11/03/2009  ?  Dilaudid [hydromorphone hcl]  03/28/2013  ? Tape Swelling and Other (See Comments) 02/21/2015  ? Vicodin [hydrocodone-acetaminophen] Nausea And Vomiting 03/05/2012  ? Latex Rash 12/06/2016  ? ? ?Family History  ?Problem Relation Age of Onset  ? Hypertension Mother   ? Cirrhosis Mother   ?     NASH  ? Coronary artery disease Father   ? Heart disease Father   ? Barrett's esophagus Father   ? Coronary artery disease Paternal Uncle   ? Coronary artery disease Cousin   ? ? ?Social History  ? ?Socioeconomic History  ? Marital status: Married  ?  Spouse name: Not on file  ? Number of children: 2  ? Years of education: Not on file  ? Highest education level: Not on file  ?Occupational History  ? Occupation: property mgmt  ?  Employer: [employer]  ?Tobacco Use  ? Smoking status: Former  ?  Types: Cigarettes  ?  Quit  date: 05/20/1991  ?  Years since quitting: 30.4  ? Smokeless tobacco: Never  ? Tobacco comments:  ?  Used to smoke a pack in 1-2 weeks/ 1992  ?Vaping Use  ? Vaping Use: Never used  ?Substance and Sexual Activity  ? Alcohol use: Not Currently  ?  Alcohol/week: 0.0 standard drinks  ?  Comment: social  ? Drug use: No  ? Sexual activity: Yes  ?  Birth control/protection: Surgical  ?Other Topics Concern  ? Not on file  ?Social History Narrative  ? Not on file  ? ?Social Determinants of Health  ? ?Financial Resource Strain: Not on file  ?Food Insecurity: Not on file  ?Transportation Needs: Not on file  ?Physical Activity: Not on file  ?Stress: Not on file  ?Social Connections: Not on file  ? ? ?Review of Systems: ?Gen: Denies fever, chills, cold or flulike symptoms, presyncope, syncope. ?CV: Denies chest pain, palpitations. ?Resp: Denies dyspnea or cough. ?GI: See HPI.  ?Heme: See HPI ? ?Physical Exam: ?BP 124/68   Pulse 84   Temp 97.6 ?F (36.4 ?C) (Temporal)   Ht 5\' 4"  (1.626 m)   Wt 272 lb 12.8 oz (123.7 kg)   BMI 46.83 kg/m?  ?General:   Alert and oriented. No distress noted. Pleasant and cooperative.   ?Head:  Normocephalic and atraumatic. ?Eyes:  Conjuctiva clear without scleral icterus. ?Heart:  S1, S2 present without murmurs appreciated. ?Lungs:  Clear to auscultation bilaterally. No wheezes, rales, or rhonchi. No

## 2021-10-20 ENCOUNTER — Other Ambulatory Visit (HOSPITAL_COMMUNITY): Payer: Self-pay | Admitting: Family Medicine

## 2021-10-20 DIAGNOSIS — J189 Pneumonia, unspecified organism: Secondary | ICD-10-CM

## 2021-10-21 ENCOUNTER — Ambulatory Visit (HOSPITAL_COMMUNITY)
Admission: RE | Admit: 2021-10-21 | Discharge: 2021-10-21 | Disposition: A | Discharge status: Home / Self Care | Payer: Medicaid Other | Source: Ambulatory Visit | Attending: Family Medicine | Admitting: Family Medicine

## 2021-10-21 ENCOUNTER — Ambulatory Visit (INDEPENDENT_AMBULATORY_CARE_PROVIDER_SITE_OTHER): Payer: Self-pay | Admitting: Gastroenterology

## 2021-10-21 ENCOUNTER — Encounter: Payer: Self-pay | Admitting: Gastroenterology

## 2021-10-21 VITALS — BP 124/68 | HR 84 | Temp 97.6°F | Ht 64.0 in | Wt 272.8 lb

## 2021-10-21 DIAGNOSIS — K2100 Gastro-esophageal reflux disease with esophagitis, without bleeding: Secondary | ICD-10-CM

## 2021-10-21 DIAGNOSIS — J189 Pneumonia, unspecified organism: Secondary | ICD-10-CM | POA: Insufficient documentation

## 2021-10-21 DIAGNOSIS — K7460 Unspecified cirrhosis of liver: Secondary | ICD-10-CM

## 2021-10-21 DIAGNOSIS — Z1211 Encounter for screening for malignant neoplasm of colon: Secondary | ICD-10-CM

## 2021-10-21 NOTE — Patient Instructions (Addendum)
Have blood work completed at [facility].  ? ?Please look at your medications when you get home to verify if you are taking Protonix.  Please call and let me know.  You should be taking Protonix 40 mg twice daily 30 minutes before breakfast and dinner. ? ?You will need to stop any over-the-counter antacid medications. ? ?You also do not need to take Carafate anymore. ? ?We will schedule you for an upper endoscopy and colonoscopy in June with Dr. Marletta Lor. ?1 day prior to procedure: Take one half dose of metformin (500 mg in the morning). ?Day of procedure: Do not take any morning diabetes medications. ? ?Continue taking lactulose as needed.  The goal is for you to have 2-3 bowel movements every day. ? ?Complete hepatitis A and B vaccination as well as pneumonia vaccine.  You may do this with your primary care doctor or local pharmacy. ? ?You will be due for an ultrasound in September.  We will arrange this for you. ? ?Nutrition:  ?High-protein diet from a primarily plant-based diet.  Try to consume 100 g of protein daily. ?Avoid red meat.  No raw or undercooked meat, seafood, or shellfish. ?Low-fat/cholesterol/carbohydrate diet. ?Limit sodium to no more than 2000 mg/day including everything that you eat and drink. ?Recommend at least 30 minutes of aerobic and resistance exercise 3 days/week. ? ? ?We will follow-up with you in 6 months.  Do not hesitate to call sooner if you have any questions or concerns. ? ?It was good to see you today.  ? ?Ermalinda Memos, PA-C ?[facility] ? ? ?

## 2021-10-22 ENCOUNTER — Encounter: Payer: Self-pay | Admitting: Gastroenterology

## 2021-10-26 ENCOUNTER — Telehealth: Payer: Self-pay | Admitting: Internal Medicine

## 2021-10-26 ENCOUNTER — Other Ambulatory Visit (HOSPITAL_COMMUNITY)
Admission: RE | Admit: 2021-10-26 | Discharge: 2021-10-26 | Disposition: A | Discharge status: Home / Self Care | Payer: Medicaid Other | Source: Ambulatory Visit | Attending: Gastroenterology | Admitting: Gastroenterology

## 2021-10-26 DIAGNOSIS — K7460 Unspecified cirrhosis of liver: Secondary | ICD-10-CM | POA: Insufficient documentation

## 2021-10-26 LAB — CBC WITH DIFFERENTIAL/PLATELET
Abs Immature Granulocytes: 0.01 10*3/uL (ref 0.00–0.07)
Basophils Absolute: 0 10*3/uL (ref 0.0–0.1)
Basophils Relative: 1 %
Eosinophils Absolute: 0 10*3/uL (ref 0.0–0.5)
Eosinophils Relative: 1 %
HCT: 34.6 % — ABNORMAL LOW (ref 36.0–46.0)
Hemoglobin: 10.9 g/dL — ABNORMAL LOW (ref 12.0–15.0)
Immature Granulocytes: 1 %
Lymphocytes Relative: 35 %
Lymphs Abs: 0.8 10*3/uL (ref 0.7–4.0)
MCH: 30 pg (ref 26.0–34.0)
MCHC: 31.5 g/dL (ref 30.0–36.0)
MCV: 95.3 fL (ref 80.0–100.0)
Monocytes Absolute: 0.2 10*3/uL (ref 0.1–1.0)
Monocytes Relative: 8 %
Neutro Abs: 1.2 10*3/uL — ABNORMAL LOW (ref 1.7–7.7)
Neutrophils Relative %: 54 %
Platelets: 52 10*3/uL — ABNORMAL LOW (ref 150–400)
RBC: 3.63 MIL/uL — ABNORMAL LOW (ref 3.87–5.11)
RDW: 15.3 % (ref 11.5–15.5)
WBC: 2.2 10*3/uL — ABNORMAL LOW (ref 4.0–10.5)
nRBC: 0 % (ref 0.0–0.2)

## 2021-10-26 LAB — COMPREHENSIVE METABOLIC PANEL
ALT: 25 U/L (ref 0–44)
AST: 41 U/L (ref 15–41)
Albumin: 3 g/dL — ABNORMAL LOW (ref 3.5–5.0)
Alkaline Phosphatase: 114 U/L (ref 38–126)
Anion gap: 5 (ref 5–15)
BUN: 13 mg/dL (ref 6–20)
CO2: 23 mmol/L (ref 22–32)
Calcium: 8.9 mg/dL (ref 8.9–10.3)
Chloride: 109 mmol/L (ref 98–111)
Creatinine, Ser: 0.45 mg/dL (ref 0.44–1.00)
GFR, Estimated: >60 mL/min (ref >60)
Glucose, Bld: 130 mg/dL — ABNORMAL HIGH (ref 70–99)
Potassium: 4 mmol/L (ref 3.5–5.1)
Sodium: 137 mmol/L (ref 135–145)
Total Bilirubin: 1.4 mg/dL — ABNORMAL HIGH (ref 0.3–1.2)
Total Protein: 6.1 g/dL — ABNORMAL LOW (ref 6.5–8.1)

## 2021-10-26 LAB — PROTIME-INR
INR: 1.2 (ref 0.8–1.2)
Prothrombin Time: 15.4 seconds — ABNORMAL HIGH (ref 11.4–15.2)

## 2021-10-26 NOTE — Telephone Encounter (Signed)
Spoke to pt, she informed me that she is just started taking Protonix twice daily. She was taking only 1 a day. She stated PCP wanted to start her on Mounjaro or Ozempic, but she wanted to check and make sure it was ok with her cirrhosis of the liver. She did not mention Omeprazole.  ?

## 2021-10-26 NOTE — Telephone Encounter (Signed)
Great.  I am glad she is taking Protonix twice daily.  Please let her know she should not take omeprazole with this.  Regarding Mounjaro or Ozempic, either of these medications would be fine.  No contraindications in the setting of cirrhosis.  She will need to let us know which medication she is started on as we may need to adjust this before her procedures in June. ?

## 2021-10-26 NOTE — Telephone Encounter (Signed)
Pt said she was returning a call from [place], [place]. I didn't see a phone note of who called. Please call patient at [phone] ?

## 2021-10-26 NOTE — Telephone Encounter (Signed)
I have not called patient since OV. Toni Amend, please call patient find out more. She was supposed to call back with an updated medication list as she wasn't sure what PPI she was taking. She though she may be taking Protonix daily and omeprazoleOTC BID.  ?

## 2021-10-27 LAB — MITOCHONDRIAL ANTIBODIES: Mitochondrial M2 Ab, IgG: <20 Units (ref 0.0–20.0)

## 2021-10-29 NOTE — Telephone Encounter (Signed)
LMOM for pt to call office back 

## 2021-10-31 ENCOUNTER — Other Ambulatory Visit: Payer: Self-pay

## 2021-10-31 ENCOUNTER — Emergency Department (HOSPITAL_COMMUNITY): Payer: Medicaid Other

## 2021-10-31 ENCOUNTER — Emergency Department (HOSPITAL_COMMUNITY)
Admission: EM | Admit: 2021-10-31 | Discharge: 2021-10-31 | Disposition: A | Discharge status: Other Acute Inpatient Hospital | Payer: Medicaid Other | Attending: Emergency Medicine | Admitting: Emergency Medicine

## 2021-10-31 ENCOUNTER — Encounter (HOSPITAL_COMMUNITY): Payer: Self-pay | Admitting: Emergency Medicine

## 2021-10-31 DIAGNOSIS — Z79899 Other long term (current) drug therapy: Secondary | ICD-10-CM | POA: Insufficient documentation

## 2021-10-31 DIAGNOSIS — E119 Type 2 diabetes mellitus without complications: Secondary | ICD-10-CM | POA: Insufficient documentation

## 2021-10-31 DIAGNOSIS — K420 Umbilical hernia with obstruction, without gangrene: Secondary | ICD-10-CM | POA: Insufficient documentation

## 2021-10-31 DIAGNOSIS — Z91040 Latex allergy status: Secondary | ICD-10-CM | POA: Insufficient documentation

## 2021-10-31 DIAGNOSIS — Z7984 Long term (current) use of oral hypoglycemic drugs: Secondary | ICD-10-CM | POA: Insufficient documentation

## 2021-10-31 DIAGNOSIS — I10 Essential (primary) hypertension: Secondary | ICD-10-CM | POA: Insufficient documentation

## 2021-10-31 LAB — CBC WITH DIFFERENTIAL/PLATELET
Abs Immature Granulocytes: 0 10*3/uL (ref 0.00–0.07)
Basophils Absolute: 0 10*3/uL (ref 0.0–0.1)
Basophils Relative: 1 %
Eosinophils Absolute: 0 10*3/uL (ref 0.0–0.5)
Eosinophils Relative: 0 %
HCT: 32.6 % — ABNORMAL LOW (ref 36.0–46.0)
Hemoglobin: 10.9 g/dL — ABNORMAL LOW (ref 12.0–15.0)
Immature Granulocytes: 0 %
Lymphocytes Relative: 16 %
Lymphs Abs: 0.3 10*3/uL — ABNORMAL LOW (ref 0.7–4.0)
MCH: 30.4 pg (ref 26.0–34.0)
MCHC: 33.4 g/dL (ref 30.0–36.0)
MCV: 91.1 fL (ref 80.0–100.0)
Monocytes Absolute: 0.3 10*3/uL (ref 0.1–1.0)
Monocytes Relative: 14 %
Neutro Abs: 1.5 10*3/uL — ABNORMAL LOW (ref 1.7–7.7)
Neutrophils Relative %: 69 %
Platelets: 47 10*3/uL — ABNORMAL LOW (ref 150–400)
RBC: 3.58 MIL/uL — ABNORMAL LOW (ref 3.87–5.11)
RDW: 15.2 % (ref 11.5–15.5)
WBC: 2.1 10*3/uL — ABNORMAL LOW (ref 4.0–10.5)
nRBC: 0 % (ref 0.0–0.2)

## 2021-10-31 LAB — COMPREHENSIVE METABOLIC PANEL
ALT: 29 U/L (ref 0–44)
AST: 46 U/L — ABNORMAL HIGH (ref 15–41)
Albumin: 2.8 g/dL — ABNORMAL LOW (ref 3.5–5.0)
Alkaline Phosphatase: 113 U/L (ref 38–126)
Anion gap: 4 — ABNORMAL LOW (ref 5–15)
BUN: 14 mg/dL (ref 6–20)
CO2: 25 mmol/L (ref 22–32)
Calcium: 8.8 mg/dL — ABNORMAL LOW (ref 8.9–10.3)
Chloride: 106 mmol/L (ref 98–111)
Creatinine, Ser: 0.47 mg/dL (ref 0.44–1.00)
GFR, Estimated: >60 mL/min (ref >60)
Glucose, Bld: 290 mg/dL — ABNORMAL HIGH (ref 70–99)
Potassium: 3.2 mmol/L — ABNORMAL LOW (ref 3.5–5.1)
Sodium: 135 mmol/L (ref 135–145)
Total Bilirubin: 1.2 mg/dL (ref 0.3–1.2)
Total Protein: 6.2 g/dL — ABNORMAL LOW (ref 6.5–8.1)

## 2021-10-31 LAB — URINALYSIS, ROUTINE W REFLEX MICROSCOPIC
Bilirubin Urine: NEGATIVE
Glucose, UA: 50 mg/dL — AB
Hgb urine dipstick: NEGATIVE
Ketones, ur: NEGATIVE mg/dL
Leukocytes,Ua: NEGATIVE
Nitrite: NEGATIVE
Protein, ur: NEGATIVE mg/dL
Specific Gravity, Urine: 1.021 (ref 1.005–1.030)
pH: 6 (ref 5.0–8.0)

## 2021-10-31 LAB — BRAIN NATRIURETIC PEPTIDE: B Natriuretic Peptide: 108 pg/mL — ABNORMAL HIGH (ref 0.0–100.0)

## 2021-10-31 LAB — PROTIME-INR
INR: 1.3 — ABNORMAL HIGH (ref 0.8–1.2)
Prothrombin Time: 16.1 seconds — ABNORMAL HIGH (ref 11.4–15.2)

## 2021-10-31 LAB — LACTIC ACID, PLASMA: Lactic Acid, Venous: 1.6 mmol/L (ref 0.5–1.9)

## 2021-10-31 LAB — LIPASE, BLOOD: Lipase: 44 U/L (ref 11–51)

## 2021-10-31 MED ORDER — ONDANSETRON HCL 4 MG/2ML IJ SOLN
4.0000 mg | Freq: Once | INTRAMUSCULAR | Status: AC
  Administered 2021-10-31: 4 mg via INTRAVENOUS
  Filled 2021-10-31: qty 2

## 2021-10-31 MED ORDER — SODIUM CHLORIDE 0.9 % IV BOLUS
500.0000 mL | Freq: Once | INTRAVENOUS | Status: AC
  Administered 2021-10-31: 500 mL via INTRAVENOUS

## 2021-10-31 MED ORDER — IOHEXOL 300 MG/ML  SOLN
100.0000 mL | Freq: Once | INTRAMUSCULAR | Status: AC | PRN
  Administered 2021-10-31: 100 mL via INTRAVENOUS

## 2021-10-31 MED ORDER — HYDROMORPHONE HCL 1 MG/ML IJ SOLN
1.0000 mg | Freq: Once | INTRAMUSCULAR | Status: AC
  Administered 2021-10-31: 1 mg via INTRAVENOUS
  Filled 2021-10-31: qty 1

## 2021-10-31 MED ORDER — SODIUM CHLORIDE 0.9 % IV BOLUS
1000.0000 mL | Freq: Once | INTRAVENOUS | Status: AC
  Administered 2021-10-31: 1000 mL via INTRAVENOUS

## 2021-10-31 NOTE — ED Notes (Signed)
Called PALS line @ [facility] for general surgery per Dr. Silvana Newness request.  Advised someone would call back shortly. ?

## 2021-10-31 NOTE — ED Triage Notes (Addendum)
Patient c/o mid/ umbilical region pain that started today around 12pm. Per patient took x2 tylenol at 1230 with no relief. Patient reports nausea without vomiting. Denies any diarrhea. Per patient urinary frequency (takes diuretic). Patient also states "I haven't felt good for "last few days"and thought it was allergies. Per patient PCP called prescription for 40mg  prednisone and cough medication. Patient does report intermittent fevers since Friday, with highest temp 102. Patient also reports recently being admitted this month for 8 days for cirrhosis.  ?

## 2021-10-31 NOTE — Consult Note (Signed)
[facility] Consult ? ?Reason for Consult: Incarcerated umbilical hernia ?Referring Physician: Dr. Charm Barges ? ?Chief Complaint   ?Abdominal Pain ?  ? ? ?HPI: Candice Stanley is a 56 y.o. female who presents to the hospital with a 6-hour history of periumbilical abdominal pain.  The pain was sharp in nature and did not improve with Tylenol or cold shower.  She confirms nausea without episodes of emesis.  She denies fevers and chills.  She was recently diagnosed with liver cirrhosis and was following with a GI doctor for this.  She was recently hospitalized for community-acquired pneumonia 1 month ago, and has had subsequent cough and wheezing.  She was prescribed a prednisone Dosepak for this.  Her surgical history is significant for laparoscopic cholecystectomy and hysterectomy.  She denies use of blood thinning medications.  She denies current smoking and alcohol use. ? ?In the ED, she underwent a CT abdomen and pelvis which demonstrated an incarcerated umbilical hernia containing a short loop of small bowel with associated bowel obstruction proximally and free fluid within the umbilical hernia sac.  The CT scan also demonstrates liver cirrhosis with portal hypertension and splenomegaly with mild ascites.  Her blood work demonstrated chronic leukopenia with a WBC of 2.1, INR of 1.3, and lactic acid of 1.6. ? ?Past Medical History:  ?Diagnosis Date  ? Anxiety   ? Cirrhosis (HCC)   ? Depression   ? GERD (gastroesophageal reflux disease)   ? with grade d esophagitis in March 2023.  ? Hypercholesteremia   ? Hypertension   ? Hypothyroidism   ? Trimalleolar fracture of ankle, closed, right, sequela   ? Type 2 diabetes mellitus (HCC)   ? ? ?Past Surgical History:  ?Procedure Laterality Date  ? ABDOMINAL HYSTERECTOMY    ? BIOPSY  09/21/2021  ? Procedure: BIOPSY;  Surgeon: Dolores Frame, MD;  [facility];  Service: Gastroenterology;;  ? CARDIAC CATHETERIZATION N/A 09/10/2015  ?  Procedure: Left Heart Cath and Coronary Angiography;  Surgeon: Corky Crafts, MD;  Location: [facility] INVASIVE CV LAB;  Service: Cardiovascular;  Laterality: N/A;  ? CHOLECYSTECTOMY    ? ESOPHAGEAL BRUSHING  09/21/2021  ? Procedure: ESOPHAGEAL BRUSHING;  Surgeon: Marguerita Merles, Reuel Boom, MD;  [facility];  Service: Gastroenterology;;  ? ESOPHAGOGASTRODUODENOSCOPY (EGD) WITH PROPOFOL N/A 09/21/2021  ? Surgeon: Marguerita Merles, Reuel Boom, MD;  grade D esophagitis with scant bleeding, KOH negative, could not rule out small varices, but no medium to large varices, mild portal hypertensive gastropathy.  Pathology showed mild foveolar hyperplasia, negative for H. pylori.  Recommended repeat EGD in 3 months.  ? OPEN REDUCTION INTERNAL FIXATION (ORIF) TIBIA/FIBULA FRACTURE Right 08/04/2016  ? Procedure: OPEN REDUCTION INTERNAL FIXATION (ORIF) RIGHT TRIMALLEOLAR FRACTURE;  Surgeon: Toni Arthurs, MD;  Location: [facility];  Service: Orthopedics;  Laterality: Right;  ? WRIST SURGERY    ? ? ?Family History  ?Problem Relation Age of Onset  ? Hypertension Mother   ? Cirrhosis Mother   ?     NASH  ? Coronary artery disease Father   ? Heart disease Father   ? Barrett's esophagus Father   ? Coronary artery disease Paternal Uncle   ? Coronary artery disease Cousin   ? ? ?Social History  ? ?Tobacco Use  ? Smoking status: Former  ?  Types: Cigarettes  ?  Quit date: 05/20/1991  ?  Years since quitting: 30.4  ? Smokeless tobacco: Never  ? Tobacco comments:  ?  Used  to smoke a pack in 1-2 weeks/ 1992  ?Vaping Use  ? Vaping Use: Never used  ?Substance Use Topics  ? Alcohol use: Not Currently  ? Drug use: No  ? ? ?Medications: I have reviewed the patient's current medications. ? ?Allergies  ?Allergen Reactions  ? Codeine Nausea And Vomiting  ? Dilaudid [Hydromorphone Hcl]   ?  Drop in blood pressure  ? Tape Swelling and Other (See Comments)  ?  Rash(paper tape)  ? Vicodin [Hydrocodone-Acetaminophen] Nausea And  Vomiting  ? Latex Rash  ? ? ? ?ROS:  ?Constitutional: negative for chills, fatigue, and fevers ?Respiratory: positive for cough and wheezing ?Cardiovascular: negative for chest pain ?Gastrointestinal: positive for abdominal pain and nausea, negative for vomiting ? ?Blood pressure (!) 114/52, pulse 79, temperature 98.3 ?F (36.8 ?C), temperature source Oral, resp. rate 19, height 5\' 4"  (1.626 m), weight 122.5 kg, SpO2 97 %. ?Physical Exam ?Vitals reviewed.  ?Constitutional:   ?   Appearance: She is well-developed. She is obese.  ?HENT:  ?   Head: Normocephalic and atraumatic.  ?Eyes:  ?   Extraocular Movements: Extraocular movements intact.  ?Cardiovascular:  ?   Rate and Rhythm: Normal rate.  ?Pulmonary:  ?   Effort: Pulmonary effort is normal.  ?Abdominal:  ?   Comments: Abdomen is soft, nondistended, no percussion tenderness, nontender to palpation; no rigidity, guarding, rebound tenderness; incarcerated umbilical hernia with some overlying skin thinning, no erythema or ecchymosis, umbilical hernia fairly nontender with significant palpation  ?Skin: ?   General: Skin is warm and dry.  ?Neurological:  ?   General: No focal deficit present.  ?   Mental Status: She is alert and oriented to person, place, and time.  ?Psychiatric:     ?   Mood and Affect: Mood normal.     ?   Behavior: Behavior normal.  ? ? ?Results: ?Results for orders placed or performed during the hospital encounter of 10/31/21 (from the past 48 hour(s))  ?CBC with Differential     Status: Abnormal  ? Collection Time: 10/31/21  4:20 PM  ?Result Value Ref Range  ? WBC 2.1 (L) 4.0 - 10.5 K/uL  ? RBC 3.58 (L) 3.87 - 5.11 MIL/uL  ? Hemoglobin 10.9 (L) 12.0 - 15.0 g/dL  ? HCT 32.6 (L) 36.0 - 46.0 %  ? MCV 91.1 80.0 - 100.0 fL  ? MCH 30.4 26.0 - 34.0 pg  ? MCHC 33.4 30.0 - 36.0 g/dL  ? RDW 15.2 11.5 - 15.5 %  ? Platelets 47 (L) 150 - 400 K/uL  ?  Comment: SPECIMEN CHECKED FOR CLOTS ?PLATELET COUNT CONFIRMED BY SMEAR ?  ? nRBC 0.0 0.0 - 0.2 %  ? Neutrophils  Relative % 69 %  ? Neutro Abs 1.5 (L) 1.7 - 7.7 K/uL  ? Lymphocytes Relative 16 %  ? Lymphs Abs 0.3 (L) 0.7 - 4.0 K/uL  ? Monocytes Relative 14 %  ? Monocytes Absolute 0.3 0.1 - 1.0 K/uL  ? Eosinophils Relative 0 %  ? Eosinophils Absolute 0.0 0.0 - 0.5 K/uL  ? Basophils Relative 1 %  ? Basophils Absolute 0.0 0.0 - 0.1 K/uL  ? Immature Granulocytes 0 %  ? Abs Immature Granulocytes 0.00 0.00 - 0.07 K/uL  ?  Comment: Performed at [facility], [address], [place], [place]  ?Comprehensive metabolic panel     Status: Abnormal  ? Collection Time: 10/31/21  4:20 PM  ?Result Value Ref Range  ? Sodium 135 135 -  145 mmol/L  ? Potassium 3.2 (L) 3.5 - 5.1 mmol/L  ? Chloride 106 98 - 111 mmol/L  ? CO2 25 22 - 32 mmol/L  ? Glucose, Bld 290 (H) 70 - 99 mg/dL  ?  Comment: Glucose reference range applies only to samples taken after fasting for at least 8 hours.  ? BUN 14 6 - 20 mg/dL  ? Creatinine, Ser 0.47 0.44 - 1.00 mg/dL  ? Calcium 8.8 (L) 8.9 - 10.3 mg/dL  ? Total Protein 6.2 (L) 6.5 - 8.1 g/dL  ? Albumin 2.8 (L) 3.5 - 5.0 g/dL  ? AST 46 (H) 15 - 41 U/L  ? ALT 29 0 - 44 U/L  ? Alkaline Phosphatase 113 38 - 126 U/L  ? Total Bilirubin 1.2 0.3 - 1.2 mg/dL  ? GFR, Estimated >60 >60 mL/min  ?  Comment: (NOTE) ?Calculated using the CKD-EPI Creatinine Equation (2021) ?  ? Anion gap 4 (L) 5 - 15  ?  Comment: Performed at [facility], [address] [place], [place] [address]  ?Lipase, blood     Status: None  ? Collection Time: 10/31/21  4:20 PM  ?Result Value Ref Range  ? Lipase 44 11 - 51 U/L  ?  Comment: Performed at [facility], [address] [place], [place] [address]  ?Protime-INR     Status: Abnormal  ? Collection Time: 10/31/21  4:20 PM  ?Result Value Ref Range  ? Prothrombin Time 16.1 (H) 11.4 - 15.2 seconds  ? INR 1.3 (H) 0.8 - 1.2  ?  Comment: (NOTE) ?INR goal varies based on device and disease states. ?Performed at [facility], [address] [place], [place] [address] ?  ?Brain natriuretic peptide      Status: Abnormal  ? Collection Time: 10/31/21  4:20 PM  ?Result Value Ref Range  ? B Natriuretic Peptide 108.0 (H) 0.0 - 100.0 pg/mL  ?  Comment: Performed at [facility], [address] [place], [place] [address]

## 2021-10-31 NOTE — ED Provider Notes (Signed)
?[facility] ?Provider Note ? ? ?CSN: [id number] ?Arrival date & time: 10/31/21  1530 ? ?  ? ?History ? ?Chief Complaint  ?Patient presents with  ? Abdominal Pain  ? ? ?Candice Stanley is a 56 y.o. female chief complaint of abdominal pain and wheezing.  Abdominal pain started around 1230 today.  Took Tylenol with no relief.  Took Carafate just prior to arrival.  Noted increased urinary urgency and frequency over the last 3 to 4 days.  Denies blood in the urine or dark color.  Also notes a returning cough and intermittent wheeze.  Was hospitalized for CAP about a month ago.  Was prescribed cough suppressant and prednisone 40 mg, but does not like the drowsiness aspect and has noticed the abdominal pain followed.  Hx of significant Grade D esophagitis and cirrhosis.  Admits to intermittent bruising.  Denies increasing or new lower extremity edema.  Also notes intermittent nighttime fevers since Friday measured via the ear canal, with the highest reading of 102.  Admits to nausea, but denies vomiting, constipation, diarrhea, bloody stools.  Denies feeling lightheaded or presyncopal.  Hx of almost radical hysterectomy with the exception of one ovary remaining. ? ?The history is provided by the patient and medical records.  ?Abdominal Pain ?Associated symptoms: cough and nausea   ? ?  ? ?Home Medications ?Prior to Admission medications   ?Medication Sig Start Date End Date Taking? Authorizing Provider  ?albuterol (VENTOLIN HFA) 108 (90 Base) MCG/ACT inhaler Inhale 1-2 puffs into the lungs every 6 (six) hours as needed for wheezing or shortness of breath. 06/28/21  Yes Jaynee Eagles, PA-C  ?Cholecalciferol (VITAMIN D3 PO) Take 1 capsule by mouth daily.   Yes [provider]  ?ELDERBERRY PO Take 1 tablet by mouth daily.   Yes [provider]  ?FLUoxetine (PROZAC) 40 MG capsule Take 40 mg by mouth daily. 08/15/19  Yes [provider]  ?HYDROcodone bit-homatropine (HYCODAN) 5-1.5  MG/5ML syrup Take 5 mLs by mouth every 4 (four) hours as needed. 10/28/21  Yes [provider]  ?lactulose (CHRONULAC) 10 GM/15ML solution Take 30 mLs (20 g total) by mouth 3 (three) times daily. ?Patient taking differently: Take 20 g by mouth 3 (three) times daily. Taking daily as needed. 09/25/21  Yes Kathie Dike, MD  ?loratadine (CLARITIN) 10 MG tablet Take 10 mg by mouth daily.   Yes [provider]  ?metFORMIN (GLUCOPHAGE) 500 MG tablet Take 500 mg by mouth 2 (two) times daily with a meal.   Yes [provider]  ?pantoprazole (PROTONIX) 40 MG tablet Take 1 tablet (40 mg total) by mouth 2 (two) times daily before a meal. 09/25/21  Yes Memon, Jolaine Artist, MD  ?predniSONE (STERAPRED UNI-PAK 21 TAB) 5 MG (21) TBPK tablet Take by mouth as directed. 10/28/21  Yes [provider]  ?vitamin C (ASCORBIC ACID) 500 MG tablet Take 500 mg by mouth daily.    Yes [provider]  ?zinc gluconate 50 MG tablet Take 50 mg by mouth daily.   Yes [provider]  ?fluticasone (FLOVENT HFA) 110 MCG/ACT inhaler Inhale 2 puffs into the lungs in the morning and at bedtime for 10 days. ?Patient not taking: Reported on 09/18/2021 07/08/21 07/18/21  Noemi Chapel, MD  ?   ? ?Allergies    ?Codeine, Dilaudid [hydromorphone hcl], Tape, Vicodin [hydrocodone-acetaminophen], and Latex   ? ?Review of Systems   ?Review of Systems  ?Respiratory:  Positive for cough and wheezing.   ?Gastrointestinal:  Positive for abdominal pain and nausea.  ? ?Physical Exam ?Updated Vital Signs ?BP 136/71   Pulse 79   Temp 98.3 ?F (36.8 ?C) (Oral)   Resp 15   Ht 5\' 4"  (1.626 m)   Wt 122.5 kg   SpO2 95%   BMI 46.35 kg/m?  ?Physical Exam ?Vitals and nursing note reviewed.  ?Constitutional:   ?   General: She is not in acute distress. ?   Appearance: She is well-developed. She is obese. She is not ill-appearing or diaphoretic.  ?HENT:  ?   Head: Normocephalic and atraumatic.  ?   Mouth/Throat:  ?   Mouth: Mucous  membranes are moist.  ?   Pharynx: Oropharynx is clear.  ?   Comments: No blood visualized in the oropharynx ?Eyes:  ?   Conjunctiva/sclera: Conjunctivae normal.  ?Cardiovascular:  ?   Rate and Rhythm: Normal rate and regular rhythm.  ?   Heart sounds: Normal heart sounds. No murmur heard. ?Pulmonary:  ?   Effort: Pulmonary effort is normal. No respiratory distress.  ?   Breath sounds: Normal breath sounds. No wheezing.  ?Chest:  ?   Chest wall: No tenderness.  ?Abdominal:  ?   General: Bowel sounds are normal.  ?   Palpations: Abdomen is soft.  ?   Tenderness: There is generalized abdominal tenderness and tenderness in the right upper quadrant, epigastric area, periumbilical area and suprapubic area. There is no guarding.  ?   Hernia: A hernia is present. Hernia is present in the umbilical area.  ?Musculoskeletal:     ?   General: No swelling.  ?   Cervical back: Neck supple.  ?Skin: ?   General: Skin is warm and dry.  ?   Capillary Refill: Capillary refill takes less than 2 seconds.  ?   Findings: Bruising present.  ?   Comments: Bruise noted on the left forearm, and 3 small bruises scattered across the abdomen  ?Neurological:  ?   Mental Status: She is alert and oriented to person, place, and time.  ?Psychiatric:     ?   Mood and Affect: Mood normal.  ? ? ?ED Results / Procedures / Treatments   ?Labs ?(all labs ordered are listed, but only abnormal results are displayed) ?Labs Reviewed  ?CBC WITH DIFFERENTIAL/PLATELET - Abnormal; Notable for the following components:  ?    Result Value  ? WBC 2.1 (*)   ? RBC 3.58 (*)   ? Hemoglobin 10.9 (*)   ? HCT 32.6 (*)   ? Platelets 47 (*)   ? Neutro Abs 1.5 (*)   ? Lymphs Abs 0.3 (*)   ? All other components within normal limits  ?COMPREHENSIVE METABOLIC PANEL - Abnormal; Notable for the following components:  ? Potassium 3.2 (*)   ? Glucose, Bld 290 (*)   ? Calcium 8.8 (*)   ? Total Protein 6.2 (*)   ? Albumin 2.8 (*)   ? AST 46 (*)   ? Anion gap 4 (*)   ? All other  components within normal limits  ?URINALYSIS, ROUTINE W REFLEX MICROSCOPIC - Abnormal; Notable for the following components:  ? Glucose, UA 50 (*)   ? All other components within normal limits  ?PROTIME-INR - Abnormal; Notable for the following components:  ? Prothrombin Time 16.1 (*)   ? INR 1.3 (*)   ? All other components within normal limits  ?BRAIN NATRIURETIC PEPTIDE - Abnormal; Notable for the following components:  ? B Natriuretic  Peptide 108.0 (*)   ? All other components within normal limits  ?LIPASE, BLOOD  ?LACTIC ACID, PLASMA  ? ? ?EKG ?None ? ?Radiology ?CT Abdomen Pelvis Wo Contrast ? ?Result Date: 10/31/2021 ?CLINICAL DATA:  Umbilical hernia postreduction, small-bowel obstruction. EXAM: CT ABDOMEN AND PELVIS WITHOUT CONTRAST TECHNIQUE: Multidetector CT imaging of the abdomen and pelvis was performed following the standard protocol without IV contrast. RADIATION DOSE REDUCTION: This exam was performed according to the departmental dose-optimization program which includes automated exposure control, adjustment of the mA and/or kV according to patient size and/or use of iterative reconstruction technique. COMPARISON:  CT examination performed earlier on the same date FINDINGS: Lower chest: No acute abnormality. Hepatobiliary: Nodular hepatic contour. No appreciable hepatic mass on this noncontrast enhanced examination. Status post cholecystectomy. No biliary dilatation. Pancreas: Unremarkable. No pancreatic ductal dilatation or surrounding inflammatory changes. Spleen: Spleen is enlarged measuring 17  cm. Adrenals/Urinary Tract: 3 cm left adrenal nodule measuring approximately 8 Hounsfield units in density, likely adenoma. No evidence of hydronephrosis. Extruded collapsed in the bilateral collecting system. Urinary bladder is unremarkable. Stomach/Bowel: There is persistent bowel loop containing umbilical hernia with dilated proximal bowel loops measuring up to 3.3 cm and collapsed distal bowel loop.  There is persistent fluid within the hernial sac. The neck of the hernia measures approximately 0.6 x 1.4 cm. Scattered colonic diverticula without evidence of acute diverticulitis. Normal appendix. Vascular/Lymphatic: No

## 2021-11-02 ENCOUNTER — Encounter: Payer: Self-pay | Admitting: *Deleted

## 2021-11-11 ENCOUNTER — Telehealth: Payer: Self-pay

## 2021-11-11 NOTE — Telephone Encounter (Signed)
Noted. Agree.

## 2021-11-11 NOTE — Telephone Encounter (Signed)
Called pt to schedule TCS/EGD w/Dr. Marletta Lor, she recently had emergency umbilical hernia repair at [facility]. Advised her to call office when she is able to proceed with having procedure. ?

## 2021-11-15 ENCOUNTER — Telehealth: Payer: Self-pay

## 2021-11-15 ENCOUNTER — Telehealth: Payer: Self-pay | Admitting: Internal Medicine

## 2021-11-15 NOTE — Telephone Encounter (Signed)
Patient daughter dropped off FMLA paperwork and I laid it in your chair     daughters number is [phone] ?

## 2021-11-15 NOTE — Telephone Encounter (Signed)
Pt LMOVM regarding her medications. She can be reached @ [phone]. Cyril Mourning seen this pt last ?

## 2021-11-16 ENCOUNTER — Other Ambulatory Visit: Payer: Self-pay | Admitting: Gastroenterology

## 2021-11-16 DIAGNOSIS — K5900 Constipation, unspecified: Secondary | ICD-10-CM

## 2021-11-16 DIAGNOSIS — K7460 Unspecified cirrhosis of liver: Secondary | ICD-10-CM

## 2021-11-16 MED ORDER — LACTULOSE 10 GM/15ML PO SOLN: 20.0000 g | Freq: Every day | ORAL | 0 refills | Status: DC | PRN

## 2021-11-16 NOTE — Telephone Encounter (Signed)
Noted  

## 2021-11-16 NOTE — Telephone Encounter (Signed)
Rx sent 

## 2021-11-16 NOTE — Telephone Encounter (Signed)
Working on FMLA papers

## 2021-11-16 NOTE — Telephone Encounter (Signed)
Spoke to pt, she informed me that she needs a refill on lactulose. She states she was taking this when she was in the hospital. Please send to [facility] in [place]. ?

## 2021-11-22 ENCOUNTER — Encounter: Payer: Self-pay | Admitting: Internal Medicine

## 2021-12-31 IMAGING — MG MM DIGITAL SCREENING BILAT W/ TOMO AND CAD
8 series · 8 of 24 positions shown · non-contrast
Comparison: None.

CLINICAL DATA: Screening. New baseline examination.

EXAM:
DIGITAL SCREENING BILATERAL MAMMOGRAM WITH TOMOSYNTHESIS AND CAD
TECHNIQUE: Bilateral screening digital craniocaudal and mediolateral oblique
mammograms were obtained. Bilateral screening digital breast
tomosynthesis was performed. The images were evaluated with
computer-aided detection.

[L CC synth-2D]
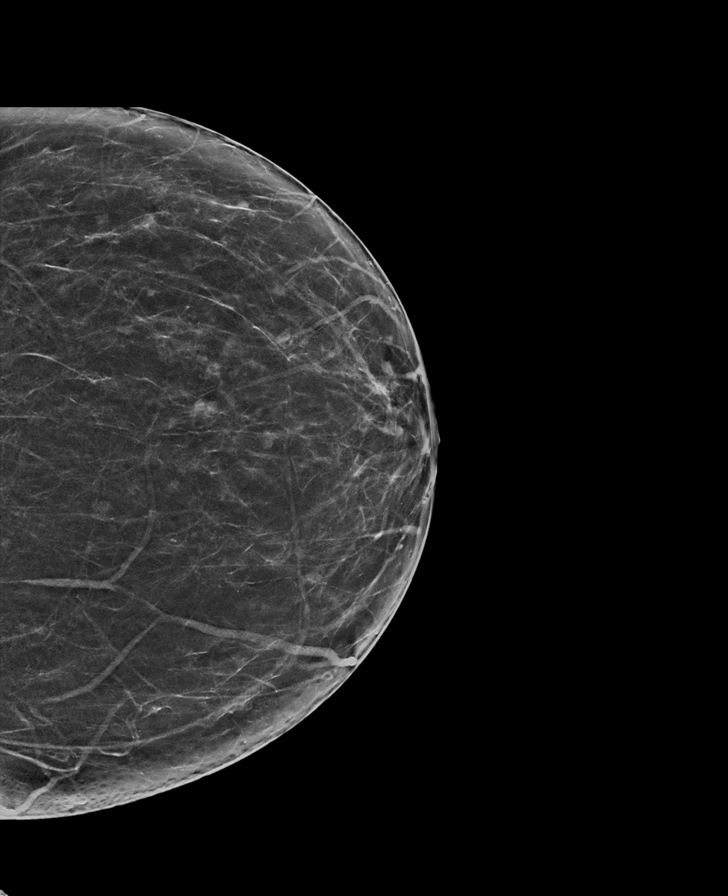

[R MLO synth-2D]
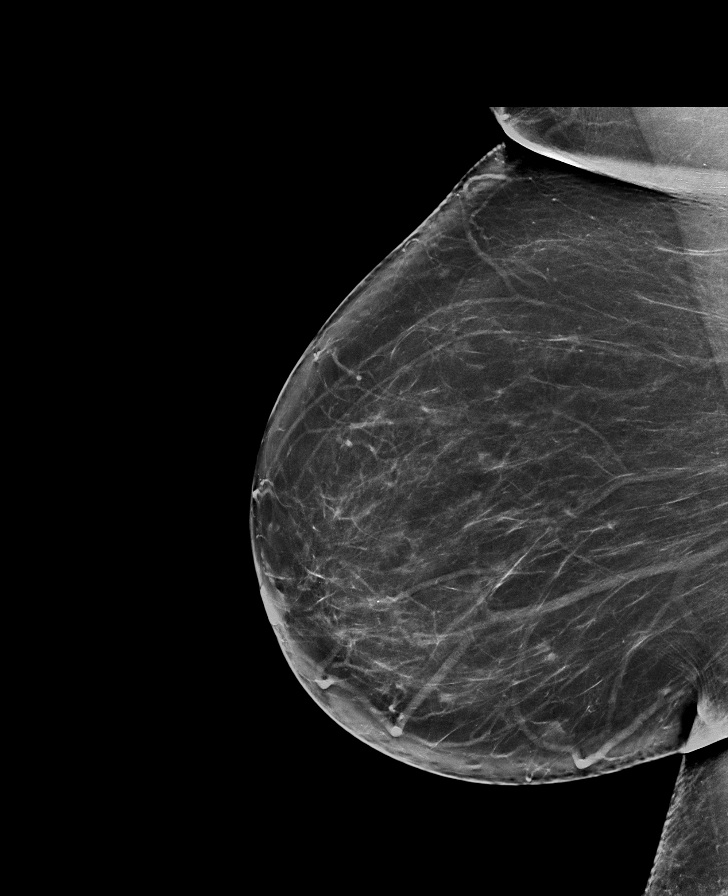

[R CC synth-2D]
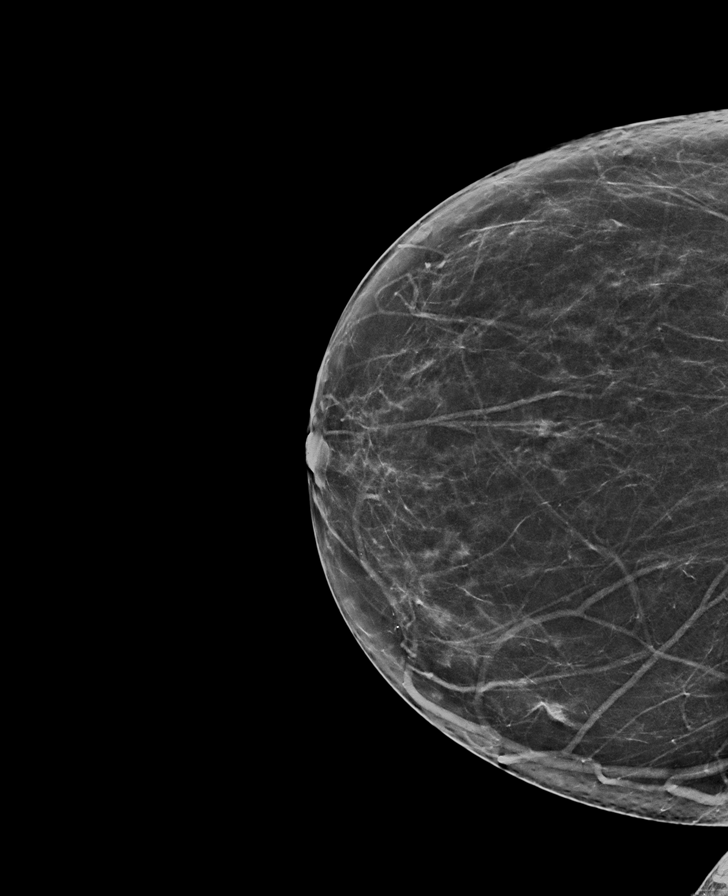

[L MLO synth-2D]
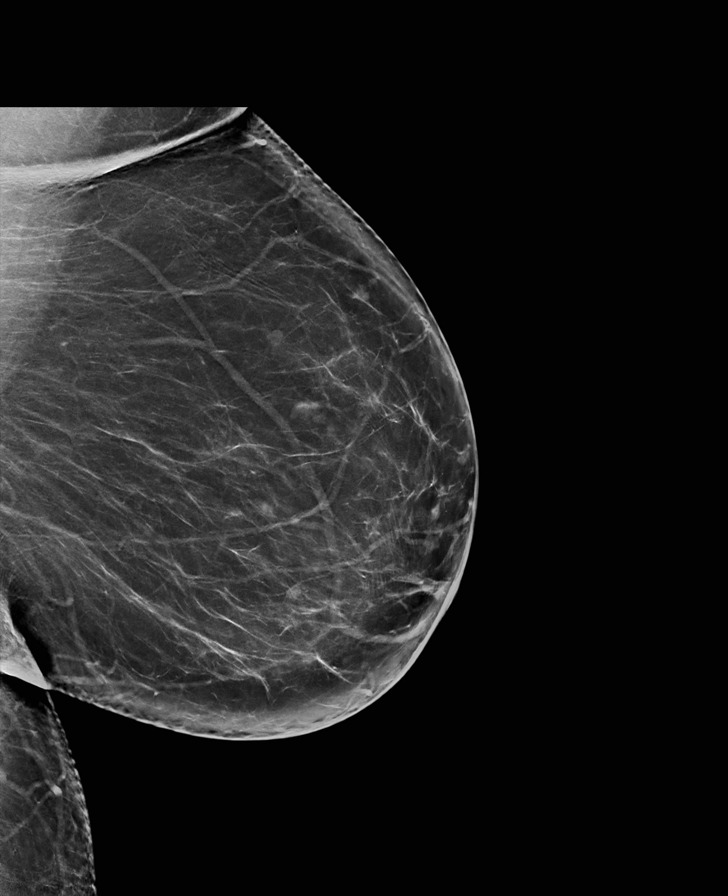

[R CC tomo · tomo slice 36/71.0]
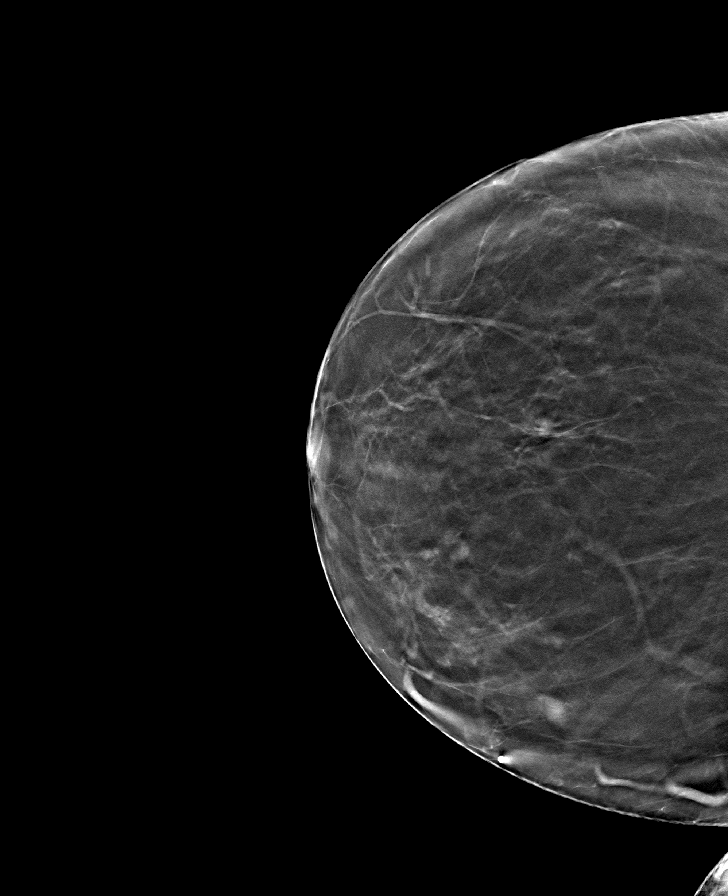

[R MLO tomo · tomo slice 43/85.0]
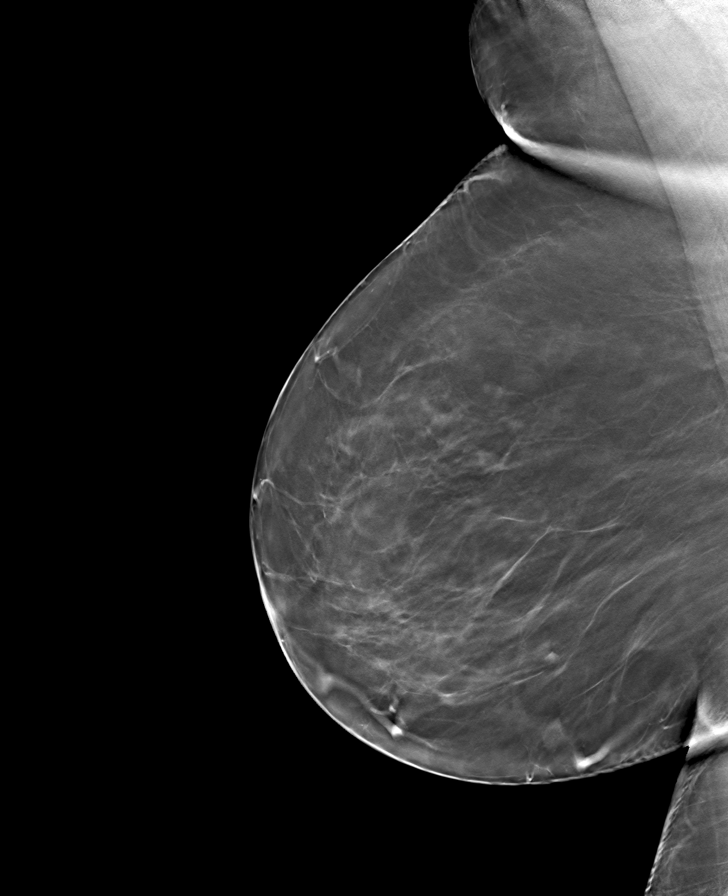

[L MLO tomo · tomo slice 43/85.0]
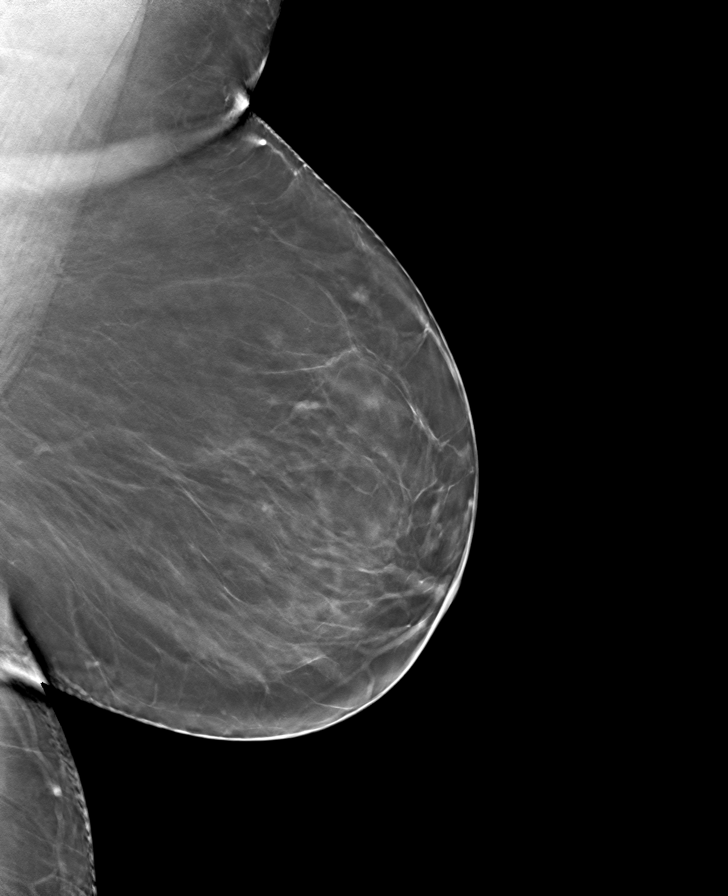

[L CC tomo · tomo slice 34/67.0]
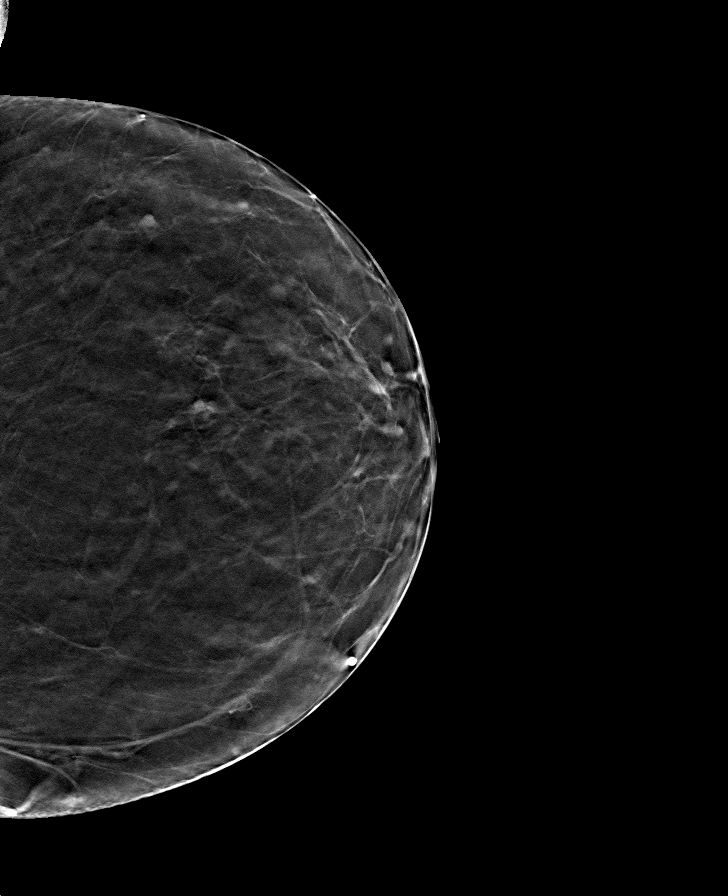

[8 of 24 positions shown; findings below may reference images not displayed]

ACR Breast Density Category b: There are scattered areas of
fibroglandular density.
FINDINGS: In the right breast, a possible asymmetry warrants further
evaluation. In the left breast, no findings suspicious for
malignancy.
IMPRESSION: Further evaluation is suggested for possible asymmetry in the right
breast.

RECOMMENDATION:
Diagnostic mammogram and possibly ultrasound of the right breast.
(Code:[id number])

The patient will be contacted regarding the findings, and additional
imaging will be scheduled.

BI-RADS CATEGORY  0: Incomplete. Need additional imaging evaluation
and/or prior mammograms for comparison.

## 2022-02-23 ENCOUNTER — Telehealth: Payer: Self-pay | Admitting: *Deleted

## 2022-02-23 NOTE — Telephone Encounter (Signed)
Recall sent 

## 2022-02-23 NOTE — Telephone Encounter (Signed)
Patient on recall for RUQ US 

## 2022-03-30 ENCOUNTER — Encounter: Payer: Self-pay | Admitting: *Deleted

## 2022-08-19 ENCOUNTER — Other Ambulatory Visit: Payer: Self-pay

## 2022-08-19 DIAGNOSIS — D61818 Other pancytopenia: Secondary | ICD-10-CM

## 2022-08-22 ENCOUNTER — Inpatient Hospital Stay: Payer: Medicaid Other | Attending: Hematology

## 2022-08-22 DIAGNOSIS — I10 Essential (primary) hypertension: Secondary | ICD-10-CM | POA: Insufficient documentation

## 2022-08-22 DIAGNOSIS — Z803 Family history of malignant neoplasm of breast: Secondary | ICD-10-CM | POA: Diagnosis not present

## 2022-08-22 DIAGNOSIS — K7581 Nonalcoholic steatohepatitis (NASH): Secondary | ICD-10-CM | POA: Insufficient documentation

## 2022-08-22 DIAGNOSIS — D61818 Other pancytopenia: Secondary | ICD-10-CM

## 2022-08-22 DIAGNOSIS — E119 Type 2 diabetes mellitus without complications: Secondary | ICD-10-CM | POA: Insufficient documentation

## 2022-08-22 DIAGNOSIS — D696 Thrombocytopenia, unspecified: Secondary | ICD-10-CM | POA: Diagnosis present

## 2022-08-22 DIAGNOSIS — Z87891 Personal history of nicotine dependence: Secondary | ICD-10-CM | POA: Insufficient documentation

## 2022-08-22 DIAGNOSIS — R161 Splenomegaly, not elsewhere classified: Secondary | ICD-10-CM | POA: Diagnosis not present

## 2022-08-22 DIAGNOSIS — D72819 Decreased white blood cell count, unspecified: Secondary | ICD-10-CM | POA: Diagnosis not present

## 2022-08-22 LAB — CBC WITH DIFFERENTIAL/PLATELET
Abs Immature Granulocytes: 0.01 10*3/uL (ref 0.00–0.07)
Basophils Absolute: 0 10*3/uL (ref 0.0–0.1)
Basophils Relative: 0 %
Eosinophils Absolute: 0 10*3/uL (ref 0.0–0.5)
Eosinophils Relative: 1 %
HCT: 36.1 % (ref 36.0–46.0)
Hemoglobin: 11.4 g/dL — ABNORMAL LOW (ref 12.0–15.0)
Immature Granulocytes: 0 %
Lymphocytes Relative: 31 %
Lymphs Abs: 0.9 10*3/uL (ref 0.7–4.0)
MCH: 28.4 pg (ref 26.0–34.0)
MCHC: 31.6 g/dL (ref 30.0–36.0)
MCV: 89.8 fL (ref 80.0–100.0)
Monocytes Absolute: 0.3 10*3/uL (ref 0.1–1.0)
Monocytes Relative: 10 %
Neutro Abs: 1.7 10*3/uL (ref 1.7–7.7)
Neutrophils Relative %: 58 %
Platelets: 53 10*3/uL — ABNORMAL LOW (ref 150–400)
RBC: 4.02 MIL/uL (ref 3.87–5.11)
RDW: 17.4 % — ABNORMAL HIGH (ref 11.5–15.5)
WBC: 3 10*3/uL — ABNORMAL LOW (ref 4.0–10.5)
nRBC: 0 % (ref 0.0–0.2)

## 2022-08-29 ENCOUNTER — Inpatient Hospital Stay (HOSPITAL_BASED_OUTPATIENT_CLINIC_OR_DEPARTMENT_OTHER): Payer: Medicaid Other | Admitting: Hematology

## 2022-08-29 ENCOUNTER — Encounter: Payer: Self-pay | Admitting: Hematology

## 2022-08-29 VITALS — BP 147/67 | HR 88 | Temp 99.6°F | Resp 18 | Ht 64.0 in | Wt 291.0 lb

## 2022-08-29 DIAGNOSIS — D696 Thrombocytopenia, unspecified: Secondary | ICD-10-CM | POA: Diagnosis not present

## 2022-08-29 DIAGNOSIS — D61818 Other pancytopenia: Secondary | ICD-10-CM | POA: Diagnosis not present

## 2022-08-29 NOTE — Progress Notes (Signed)
[facility] [address] [place] [address]    Clinic Day:  08/29/2022  Referring physician: Celene Squibb, MD  Patient Care Team: Celene Squibb, MD as PCP - General (Internal Medicine) Danie Binder, MD (Inactive) as Consulting Physician (Gastroenterology) [name], DO as Consulting Physician (Gastroenterology)   ASSESSMENT & PLAN:   Assessment: 1.  Leukopenia and thrombocytopenia: - Patient seen at the request of Dr. Juel Burrow office. - CBC on 03/31/2021 with white count 2.1, platelet count 48, ANC 900.  Hemoglobin was 11.7. - CBC on 12/30/2020 white count 2.5, platelet count 57, hemoglobin 12.5, ANC 1.2. - Ultrasound abdomen on 06/05/2018 with increased hepatic echogenicity, fatty infiltration and splenomegaly measuring 18.8 cm, volume 1943. - No bleeding although easy bruising.  No B symptoms or infections.  No history of transfusions or hepatitis. - Progressive thrombocytopenia and leukopenia since 2018. - BMBX on 04/21/2021 showed slightly hypercellular bone marrow with trilineage hematopoiesis.  Slight lymphocytosis of primarily small lymphoid cells with lack of significant aggregates.  Chromosome analysis is normal.  2.  Social/family history: - Lives at home with her husband.  She works as a Secondary school teacher.  She is accompanied by her daughter today.  Denies any chemical exposure.  She is a non-smoker. - Her mother hadsNash cirrhosis.  Maternal grandmother had breast cancer.  Paternal aunt had breast cancer.   Plan: 1.  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 - She had pneumonia with sepsis in March 2023 requiring admission.  She did not have any further infections after that. - Denies any nosebleeds, bleeding per rectum or melena. - She is currently working in home health. - Reviewed labs from 08/22/2022: WBC-3.0 with ANC of 1.7.  PLT-53.  Hemoglobin is 11.4. - No indication for  further workup at this time. - Will consider splenic embolization if platelet count continues to drop. - RTC 6 months with repeat CBC.    Orders Placed This Encounter  Procedures   CBC with Differential/Platelet    Standing Status:   Future    Standing Expiration Date:   08/30/2023    Order Specific Question:   Release to patient    Answer:   Immediate   Lactate dehydrogenase    Standing Status:   Future    Standing Expiration Date:   08/30/2023    Order Specific Question:   Release to patient    Answer:   Immediate      Kaleen Odea as a scribe for Derek Jack, MD.,have documented all relevant documentation on the behalf of Derek Jack, MD,as directed by  Derek Jack, MD while in the presence of Derek Jack, MD.   I, Derek Jack MD, have reviewed the above documentation for accuracy and completeness, and I agree with the above.   Derek Jack, MD   2/19/20245:39 PM  CHIEF COMPLAINT:   Diagnosis:  leukopenia and thrombocytopenia    Cancer Staging  No matching staging information was found for the patient.   Prior Therapy: None  Current Therapy:   surveillance   HISTORY OF PRESENT ILLNESS:   Oncology History   No history exists.     INTERVAL HISTORY:   Candice Stanley is a 57 y.o. female presenting to clinic today for follow up of leukopenia and thrombocytopenia. She was last seen by me on 05/06/2021.  On 07/08/2021 she had pneumonia. 09/17/2021 she had Pneumonia of both lower lobes and was admitted to the hospital.  On 10/31/2021 she had an incarcerated umbilical hernia.   Today, she states that she is doing well overall. Her appetite level is at 100%. Her energy level is at 0%. She denies any recent bleeding. She denies any blood in stool and black stool. She denies new swelling in her ankles.    She currently takes Vitamin C(zinc), Multivitamin, D3, and elderberry.   She currently works in home health care.    PAST MEDICAL HISTORY:   Past Medical History: Past Medical History:  Diagnosis Date   Anxiety    Cirrhosis ([facility] [place])    Depression    GERD (gastroesophageal reflux disease)    with grade d esophagitis in March 2023.   Hypercholesteremia    Hypertension    Hypothyroidism    Trimalleolar fracture of ankle, closed, right, sequela    Type 2 diabetes mellitus ([place])     Surgical History: Past Surgical History:  Procedure Laterality Date   ABDOMINAL HYSTERECTOMY     BIOPSY  09/21/2021   Procedure: BIOPSY;  Surgeon: Harvel Quale, MD;  [facility]: [facility];  Service: Gastroenterology;;   CARDIAC CATHETERIZATION N/A 09/10/2015   Procedure: Left Heart Cath and Coronary Angiography;  Surgeon: Jettie Booze, MD;  Location: [facility];  Service: Cardiovascular;  Laterality: N/A;   CHOLECYSTECTOMY     ESOPHAGEAL BRUSHING  09/21/2021   Procedure: ESOPHAGEAL BRUSHING;  Surgeon: Montez Morita, Quillian Quince, MD;  Location: [facility];  Service: Gastroenterology;;   ESOPHAGOGASTRODUODENOSCOPY (EGD) WITH PROPOFOL N/A 09/21/2021   Surgeon: Harvel Quale, MD;  grade D esophagitis with scant bleeding, KOH negative, could not rule out small varices, but no medium to large varices, mild portal hypertensive gastropathy.  Pathology showed mild foveolar hyperplasia, negative for H. pylori.  Recommended repeat EGD in 3 months.   OPEN REDUCTION INTERNAL FIXATION (ORIF) TIBIA/FIBULA FRACTURE Right 08/04/2016   Procedure: OPEN REDUCTION INTERNAL FIXATION (ORIF) RIGHT TRIMALLEOLAR FRACTURE;  Surgeon: Wylene Simmer, MD;  Location: [facility];  Service: Orthopedics;  Laterality: Right;   WRIST SURGERY      Social History: Social History   Socioeconomic History   Marital status: Married    Spouse name: Not on file   Number of children: 2   Years of education: Not on file   Highest education level: Not on file  Occupational History   Occupation: property  mgmt    Employer: [employer]  Tobacco Use   Smoking status: Former    Types: Cigarettes    Quit date: 05/20/1991    Years since quitting: 31.2   Smokeless tobacco: Never   Tobacco comments:    Used to smoke a pack in 1-2 weeks/ 1992  Vaping Use   Vaping Use: Never used  Substance and Sexual Activity   Alcohol use: Not Currently   Drug use: No   Sexual activity: Yes    Birth control/protection: Surgical  Other Topics Concern   Not on file  Social History Narrative   Not on file   Social Determinants of Health   Financial Resource Strain: Not on file  Food Insecurity: Not on file  Transportation Needs: Not on file  Physical Activity: Not on file  Stress: Not on file  Social Connections: Not on file  Intimate Partner Violence: Not on file    Family History: Family History  Problem Relation Age of Onset   Hypertension Mother    Cirrhosis Mother        NASH   Coronary  artery disease Father    Heart disease Father    Barrett's esophagus Father    Coronary artery disease Paternal Uncle    Coronary artery disease Cousin     Current Medications:  Current Outpatient Medications:    albuterol (VENTOLIN HFA) 108 (90 Base) MCG/ACT inhaler, Inhale 1-2 puffs into the lungs every 6 (six) hours as needed for wheezing or shortness of breath., Disp: 18 g, Rfl: 0   amLODipine (NORVASC) 5 MG tablet, Take 5 mg by mouth daily., Disp: , Rfl:    Cholecalciferol (VITAMIN D3 PO), Take 1 capsule by mouth daily., Disp: , Rfl:    ELDERBERRY PO, Take 1 tablet by mouth daily., Disp: , Rfl:    FLUoxetine (PROZAC) 40 MG capsule, Take 40 mg by mouth daily., Disp: , Rfl:    fluticasone (FLOVENT HFA) 110 MCG/ACT inhaler, Inhale 2 puffs into the lungs in the morning and at bedtime for 10 days., Disp: 1 each, Rfl: 12   HYDROcodone bit-homatropine (HYCODAN) 5-1.5 MG/5ML syrup, Take 5 mLs by mouth every 4 (four) hours as needed., Disp: , Rfl:    lactulose (CHRONULAC) 10 GM/15ML solution, Take 30  mLs (20 g total) by mouth daily as needed for mild constipation. Goal of 2-3 soft bowel movements daily., Disp: 236 mL, Rfl: 0   loratadine (CLARITIN) 10 MG tablet, Take 10 mg by mouth daily., Disp: , Rfl:    metFORMIN (GLUCOPHAGE) 500 MG tablet, Take 500 mg by mouth 2 (two) times daily with a meal., Disp: , Rfl:    pantoprazole (PROTONIX) 40 MG tablet, Take 1 tablet (40 mg total) by mouth 2 (two) times daily before a meal., Disp: 60 tablet, Rfl: 2   vitamin C (ASCORBIC ACID) 500 MG tablet, Take 500 mg by mouth daily. , Disp: , Rfl:    zinc gluconate 50 MG tablet, Take 50 mg by mouth daily., Disp: , Rfl:    Allergies: Allergies  Allergen Reactions   Codeine Nausea And Vomiting   Dilaudid [Hydromorphone Hcl]     Drop in blood pressure   Tape Swelling and Other (See Comments)    Rash(paper tape)   Vicodin [Hydrocodone-Acetaminophen] Nausea And Vomiting   Latex Rash    REVIEW OF SYSTEMS:   Review of Systems  Constitutional:  Negative for chills, fatigue and fever.  HENT:   Negative for lump/mass, mouth sores, nosebleeds, sore throat and trouble swallowing.   Eyes:  Negative for eye problems.  Respiratory:  Positive for cough. Negative for shortness of breath.   Cardiovascular:  Negative for chest pain, leg swelling and palpitations.  Gastrointestinal:  Negative for abdominal pain, constipation, diarrhea, nausea and vomiting.  Genitourinary:  Negative for bladder incontinence, difficulty urinating, dysuria, frequency, hematuria and nocturia.   Musculoskeletal:  Negative for arthralgias, back pain, flank pain, myalgias and neck pain.  Skin:  Negative for itching and rash.  Neurological:  Positive for dizziness (PCP  08/30/2022). Negative for headaches and numbness.  Hematological:  Does not bruise/bleed easily.  Psychiatric/Behavioral:  Negative for depression, sleep disturbance and suicidal ideas. The patient is not nervous/anxious.   All other systems reviewed and are negative.     VITALS:   Blood pressure (!) 147/67, pulse 88, temperature 99.6 F (37.6 C), temperature source Tympanic, resp. rate 18, height 5' 4"$  (1.626 m), weight 291 lb (132 kg), SpO2 98 %.  Wt Readings from Last 3 Encounters:  08/29/22 291 lb (132 kg)  10/31/21 270 lb (122.5 kg)  10/21/21 272 lb 12.8 oz (  123.7 kg)    Body mass index is 49.95 kg/m.  Performance status (ECOG): 0 - Asymptomatic  PHYSICAL EXAM:   Physical Exam Vitals and nursing note reviewed. Exam conducted with a chaperone present.  Constitutional:      Appearance: Normal appearance.  Cardiovascular:     Rate and Rhythm: Normal rate and regular rhythm.     Pulses: Normal pulses.     Heart sounds: Normal heart sounds.  Pulmonary:     Effort: Pulmonary effort is normal.     Breath sounds: Normal breath sounds.  Abdominal:     Palpations: Abdomen is soft. There is no hepatomegaly, splenomegaly or mass.     Tenderness: There is no abdominal tenderness.  Musculoskeletal:     Right lower leg: No edema.     Left lower leg: No edema.  Lymphadenopathy:     Cervical: No cervical adenopathy.     Right cervical: No superficial, deep or posterior cervical adenopathy.    Left cervical: No superficial, deep or posterior cervical adenopathy.     Upper Body:     Right upper body: No supraclavicular or axillary adenopathy.     Left upper body: No supraclavicular or axillary adenopathy.  Neurological:     General: No focal deficit present.     Mental Status: She is alert and oriented to person, place, and time.  Psychiatric:        Mood and Affect: Mood normal.        Behavior: Behavior normal.     LABS:      Latest Ref Rng & Units 08/22/2022    2:37 PM 10/31/2021    4:20 PM 10/26/2021    3:40 PM  CBC  WBC 4.0 - 10.5 K/uL 3.0  2.1  2.2   Hemoglobin 12.0 - 15.0 g/dL 11.4  10.9  10.9   Hematocrit 36.0 - 46.0 % 36.1  32.6  34.6   Platelets 150 - 400 K/uL 53  47  52       Latest Ref Rng & Units 10/31/2021    4:20 PM  10/26/2021    3:40 PM 09/24/2021    5:07 AM  CMP  Glucose 70 - 99 mg/dL 290  130  207   BUN 6 - 20 mg/dL 14  13  11   $ Creatinine 0.44 - 1.00 mg/dL 0.47  0.45  0.55   Sodium 135 - 145 mmol/L 135  137  137   Potassium 3.5 - 5.1 mmol/L 3.2  4.0  4.5   Chloride 98 - 111 mmol/L 106  109  105   CO2 22 - 32 mmol/L 25  23  26   $ Calcium 8.9 - 10.3 mg/dL 8.8  8.9  9.1   Total Protein 6.5 - 8.1 g/dL 6.2  6.1  5.9   Total Bilirubin 0.3 - 1.2 mg/dL 1.2  1.4  1.1   Alkaline Phos 38 - 126 U/L 113  114  99   AST 15 - 41 U/L 46  41  43   ALT 0 - 44 U/L 29  25  22      $ No results found for: "CEA1", "CEA" / No results found for: "CEA1", "CEA" No results found for: "PSA1" No results found for: "[id number]" No results found for: "CAN125"  Lab Results  Component Value Date   TOTALPROTELP 5.9 (L) 04/09/2021   ALBUMINELP 3.1 04/09/2021   A1GS 0.2 04/09/2021   A2GS 0.4 04/09/2021   BETS 1.0 04/09/2021   GAMS  1.2 04/09/2021   MSPIKE Not Observed 04/09/2021   SPEI Comment 04/09/2021   Lab Results  Component Value Date   TIBC 318 09/18/2021   FERRITIN 23 09/18/2021   IRONPCTSAT 15 09/18/2021   Lab Results  Component Value Date   LDH 163 04/09/2021     STUDIES:   No results found.

## 2022-08-29 NOTE — Patient Instructions (Addendum)
Candice Stanley  Discharge Instructions  You were seen and examined today by Dr. Delton Coombes.  Dr. Delton Coombes discussed your most recent lab work which revealed that everything looks good and stable.  Follow-up as scheduled in 6 months with labs.    Thank you for choosing [facility] to provide your oncology and hematology care.   To afford each patient quality time with our provider, please arrive at least 15 minutes before your scheduled appointment time. You may need to reschedule your appointment if you arrive late (10 or more minutes). Arriving late affects you and other patients whose appointments are after yours.  Also, if you miss three or more appointments without notifying the office, you may be dismissed from the clinic at the provider's discretion.    Again, thank you for choosing [facility].  Our hope is that these requests will decrease the amount of time that you wait before being seen by our physicians.   If you have a lab appointment with the [facility] please come in thru the Main Entrance and check in at the main information desk.           _____________________________________________________________  Should you have questions after your visit to [facility], please contact our office at [phone] and follow the prompts.  Our office hours are 8:00 a.m. to 4:30 p.m. Monday - Thursday and 8:00 a.m. to 2:30 p.m. Friday.  Please note that voicemails left after 4:00 p.m. may not be returned until the following business day.  We are closed weekends and all major holidays.  You do have access to a nurse 24-7, just call the main number to the clinic [phone] and do not press any options, hold on the line and a nurse will answer the phone.    For prescription refill requests, have your pharmacy contact our office and allow 72 hours.    Masks are optional in the cancer centers. If you would  like for your care team to wear a mask while they are taking care of you, please let them know. You may have one support person who is at least 59 years old accompany you for your appointments.

## 2022-09-03 NOTE — H&P (View-Only) (Signed)
Referring Provider: Celene Squibb, MD Primary Care Physician:  Celene Squibb, MD Primary GI Physician: Dr. Abbey Chatters  Chief Complaint  Patient presents with   Follow-up    HPI:   Candice Stanley is a 57 y.o. female with history of type 2 diabetes, HTN, HLD, hypothyroidism, GERD with esophagitis, cirrhosis, presenting today for follow-up.  Work-up for cirrhosis has been negative for vital hepatitis A, B, C without immunity to hepatitis A or B, negative ANA, AMA, and ASMA, normal IgG, normal iron panel.   EGD 09/21/2021 with grade D esophagitis with scant bleeding, KOH negative, no medium to large varices but unable to rule out small varices, mild portal hypertensive gastropathy.  Pathology with mild foveolar hyperplasia, negative for H. pylori.  Recommended repeat EGD in 3 months.  Last seen in our office 10/21/2021.  Reported chronic easy bruising, but no other symptoms of decompensated liver disease.  She is taking lactulose as needed and having 2 bowel movements every day.  No overt GI bleeding.  Was not sure what medication she was taking for GERD but thought she was taking Protonix once a day and over-the-counter omeprazole twice a day.  She had discontinued ibuprofen and Goody powders and overall her symptoms were well-controlled.  No alarm symptoms.  Recommended Protonix 40 mg twice daily and discontinuing omeprazole.  She was due for surveillance EGD in June.  Planned to update labs to calculate MELD and also check AMA, proceed with surveillance EGD and screening colonoscopy.   Labs completed and corresponded with MELD Na 10.   We called patient to schedule procedures in early May.  She had just recently had emergency umbilical hernia repair at [facility] on 11/01/21.  Requested she call back when she was ready to proceed with procedures, but we did not hear back from her.   Today:  Cirrhosis: Labs: Overdue MELD: MELD Na 18 October 2021 [place]: Overdue. Last imaging CT A/P with contrast 10/31/21  without focal liver lesion.  Hep A/B vaccination: Needs to get this completed.  EGD: Overdue. Last EGD March 2023 with no medium to large varices but unable to rule out small varice Ascites/peripheral edema: Intermittent tightness in her abdomen after starting Monjuaro. No nausea or vomiting. No abdominal pain. Has some chronic intermittent swelling in her ankles. Chronic swelling in right ankle since 2017 when she broke her ankle.  Diuretics: None.  Encephalopathy: None. Not taking lactulose. Has been having bowel movements daily.  Bruising/bleeding: Bruises easily. No bleeding. No brbpr or or melena.   No alcohol.   GERD: Reflux daily. Out of pantoprazole. Taking OTC prilosec twice daily and tums. Has a cough and hoarseness.  No dysphagia.   Colon cancer screening: No Fhx of colon cancer.   Past Medical History:  Diagnosis Date   Anxiety    Cirrhosis ([place])    Depression    GERD (gastroesophageal reflux disease)    with grade d esophagitis in March 2023.   Hypercholesteremia    Hypertension    Hypothyroidism    Trimalleolar fracture of ankle, closed, right, sequela    Type 2 diabetes mellitus ([place])     Past Surgical History:  Procedure Laterality Date   ABDOMINAL HYSTERECTOMY     BIOPSY  09/21/2021   Procedure: BIOPSY;  Surgeon: Harvel Quale, MD;  [facility];  Service: Gastroenterology;;   CARDIAC CATHETERIZATION N/A 09/10/2015   Procedure: Left Heart Cath and Coronary Angiography;  Surgeon: Jettie Booze, MD;  Location:  [place] INVASIVE CV LAB;  Service: Cardiovascular;  Laterality: N/A;   CHOLECYSTECTOMY     ESOPHAGEAL BRUSHING  09/21/2021   Procedure: ESOPHAGEAL BRUSHING;  Surgeon: Montez Morita, Quillian Quince, MD;  [facility];  Service: Gastroenterology;;   ESOPHAGOGASTRODUODENOSCOPY (EGD) WITH PROPOFOL N/A 09/21/2021   Surgeon: Harvel Quale, MD;  grade D esophagitis with scant bleeding, KOH negative, could not rule out  small varices, but no medium to large varices, mild portal hypertensive gastropathy.  Pathology showed mild foveolar hyperplasia, negative for H. pylori.  Recommended repeat EGD in 3 months.   OPEN REDUCTION INTERNAL FIXATION (ORIF) TIBIA/FIBULA FRACTURE Right 08/04/2016   Procedure: OPEN REDUCTION INTERNAL FIXATION (ORIF) RIGHT TRIMALLEOLAR FRACTURE;  Surgeon: Wylene Simmer, MD;  Location: [facility];  Service: Orthopedics;  Laterality: Right;   WRIST SURGERY      Current Outpatient Medications  Medication Sig Dispense Refill   albuterol (VENTOLIN HFA) 108 (90 Base) MCG/ACT inhaler Inhale 1-2 puffs into the lungs every 6 (six) hours as needed for wheezing or shortness of breath. 18 g 0   amLODipine (NORVASC) 5 MG tablet Take 5 mg by mouth daily.     Cholecalciferol (VITAMIN D3 PO) Take 1 capsule by mouth daily.     ELDERBERRY PO Take 1 tablet by mouth daily.     FLUoxetine (PROZAC) 40 MG capsule Take 40 mg by mouth daily.     loratadine (CLARITIN) 10 MG tablet Take 10 mg by mouth daily.     metFORMIN (GLUCOPHAGE) 500 MG tablet Take 500 mg by mouth 2 (two) times daily with a meal.     tirzepatide (MOUNJARO) 2.5 MG/0.5ML Pen Inject 2.5 mg into the skin once a week. Once a week injection     vitamin C (ASCORBIC ACID) 500 MG tablet Take 500 mg by mouth daily.      zinc gluconate 50 MG tablet Take 50 mg by mouth daily.     fluticasone (FLOVENT HFA) 110 MCG/ACT inhaler Inhale 2 puffs into the lungs in the morning and at bedtime for 10 days. 1 each 12   lactulose (CHRONULAC) 10 GM/15ML solution Take 30 mLs (20 g total) by mouth daily as needed for mild constipation. Goal of 2-3 soft bowel movements daily. (Patient not taking: Reported on 09/05/2022) 236 mL 0   pantoprazole (PROTONIX) 40 MG tablet Take 1 tablet (40 mg total) by mouth 2 (two) times daily before a meal. 60 tablet 3   No current facility-administered medications for this visit.    Allergies as of 09/05/2022 - Review  Complete 09/05/2022  Allergen Reaction Noted   Codeine Nausea And Vomiting 11/03/2009   Dilaudid [hydromorphone hcl]  03/28/2013   Tape Swelling and Other (See Comments) 02/21/2015   Vicodin [hydrocodone-acetaminophen] Nausea And Vomiting 03/05/2012   Latex Rash 12/06/2016    Family History  Problem Relation Age of Onset   Hypertension Mother    Cirrhosis Mother        NASH   Coronary artery disease Father    Heart disease Father    Barrett's esophagus Father    Coronary artery disease Paternal Uncle    Coronary artery disease Cousin    Colon cancer Neg Hx     Social History   Socioeconomic History   Marital status: Married    Spouse name: Not on file   Number of children: 2   Years of education: Not on file   Highest education level: Not on file  Occupational History   Occupation:  property mgmt    Employer: [employer]  Tobacco Use   Smoking status: Former    Types: Cigarettes    Quit date: 05/20/1991    Years since quitting: 31.3   Smokeless tobacco: Never   Tobacco comments:    Used to smoke a pack in 1-2 weeks/ 1992  Vaping Use   Vaping Use: Never used  Substance and Sexual Activity   Alcohol use: Not Currently   Drug use: No   Sexual activity: Yes    Birth control/protection: Surgical  Other Topics Concern   Not on file  Social History Narrative   Not on file   Social Determinants of Health   Financial Resource Strain: Not on file  Food Insecurity: Not on file  Transportation Needs: Not on file  Physical Activity: Not on file  Stress: Not on file  Social Connections: Not on file    Review of Systems: Gen: Denies fever, chills, cold or flu like symptoms, pre-syncope, or syncope.  CV: Denies chest pain, palpitations. Resp: Denies dyspnea. Admits to cough.  GI: See HPI Derm: Denies rash. Psych: Denies depression, anxiety. Heme: See HPI  Physical Exam: BP 138/80 (BP Location: Right Arm, Patient Position: Sitting, Cuff Size: Large)   Pulse  87   Temp 97.9 F (36.6 C) (Temporal)   Ht 5\' 4"  (1.626 m)   Wt 285 lb 6.4 oz (129.5 kg)   SpO2 95%   BMI 48.99 kg/m  General:   Alert and oriented. No distress noted. Pleasant and cooperative.  Head:  Normocephalic and atraumatic. Eyes:  Conjuctiva clear without scleral icterus. Heart:  S1, S2 present without murmurs appreciated. Lungs:  Clear to auscultation bilaterally. No wheezes, rales, or rhonchi. No distress.  Abdomen:  +BS, soft, non-tender and non-distended. No rebound or guarding. No HSM or masses noted. Msk:  Symmetrical without gross deformities. Normal posture. Extremities:  With trace bilateral LE edema.  Neurologic:  Alert and  oriented x4 Psych:  Normal mood and affect.    Assessment:  57 y.o. female with history of type 2 diabetes, HTN, HLD, hypothyroidism, GERD with esophagitis, cirrhosis, presenting today for follow-up.   Cirrhosis:  With thrombocytopenia, platelets recently 53.  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  No significant alcohol history.  Work-up thus far has been negative for vital hepatitis A, B, C without immunity to hepatitis A or B, negative ANA, AMA, ASMA, normal IgG, normal iron panel.  MELD Na 10 in August 2023, currently overdue for labs, ultrasound, and EGD.  Clinically she is doing well without signs or symptoms of decompensation.  Last EGD in March 2023 with grade D esophagitis which limited the ability to rule out small varices, but no medium to large varices.  She did have mild portal hypertensive gastropathy.  GERD:  Chronic.  Previously well-controlled on pantoprazole 40 mg twice daily, but she ran out and is currently taking OTC omeprazole twice daily with daily symptoms. Also with cough and hoarseness which could be secondary to GERD.  Colon cancer screening:  Needs first-ever screening colonoscopy.  No lower GI alarm symptoms.  No family history of colon cancer.   Plan:  HFP, BMP, INR,  AFP. Abdominal ultrasound Proceed with colonoscopy and EGD with propofol by Dr. Abbey Chatters in near future. The risks, benefits, and alternatives have been discussed with the patient in detail. The patient states understanding and desires to proceed.  ASA 3 Separate written instructions provided for diabetes medication adjustments.  Complete hepatitis A/B vaccine series. Nutrition:  High-protein diet from a primarily plant-based diet. Avoid red meat.  No raw or undercooked meat, seafood, or shellfish. Low-fat/cholesterol/carbohydrate diet. Limit sodium to no more than 2000 mg/day including everything that you eat and drink. Recommend at least 30 minutes of aerobic and resistance exercise 3 days/week. Stop omeprazole. Start Protonix 40 mg twice daily. Counseled on GERD diet/lifestyle.  Separate written instructions provided on AVS. Follow-up in 3 months or sooner if needed.   Aliene Altes, PA-C [facility] Gastroenterology 09/05/2022

## 2022-09-03 NOTE — Progress Notes (Unsigned)
Referring Provider: Celene Squibb, MD Primary Care Physician:  Celene Squibb, MD Primary GI Physician: Dr. Abbey Chatters  No chief complaint on file.   HPI:   Candice Stanley is a 57 y.o. female with history of type 2 diabetes, HTN, HLD, hypothyroidism, GERD with esophagitis, cirrhosis, ***  Work-up for cirrhosis has been negative for vital hepatitis A, B, C without immunity to hepatitis A or B, negative ANA, AMA, and ASMA, normal IgG, normal iron panel.   EGD 09/21/2021 with grade D esophagitis with scant bleeding, KOH negative, no medium to large varices but unable to rule out small varices, mild portal hypertensive gastropathy.  Pathology with mild foveolar hyperplasia, negative for H. pylori.  Recommended repeat EGD in 3 months.  Last seen in our office 10/21/2021.  Reported chronic easy bruising, but no other symptoms of decompensated liver disease.  She is taking lactulose as needed and having 2 bowel movements every day.  No overt GI bleeding.  Was not sure what medication she was taking for GERD but thought she was taking Protonix once a day and over-the-counter omeprazole twice a day.  She had discontinued ibuprofen and Goody powders and overall her symptoms were well-controlled.  No alarm symptoms.  Recommended Protonix 40 mg twice daily and discontinuing omeprazole.  She was due for surveillance EGD in June.  Will plan to update labs to calculate MELD and also check AMA, proceed with surveillance EGD and screening colonoscopy.   Labs completed and corresponded with MELD Na 10.   We call patient to schedule procedures in early May.  She had just recently had emergency umbilical hernia repair at [facility] on 11/01/21.  Requested she call back when she was ready to proceed with procedures, but she never called back.  Today:  Cirrhosis: Labs: Overdue MELD: MELD Na 18 October 2021 [place]: Overdue. Last imaging CT A/P with contrast 10/31/21 without focal liver lesion.  Hep A/B vaccination: *** EGD:  Overdue. Last EGD March 2023 with no medium to large varices but unable to rule out small varice Ascites/peripheral edema:  Diuretics:  Encephalopathy:    GERD:  Colon cancer screening:   Hemoglobin remains low at 11.4 on 08/22/2022.  Normocytic indices.  Overdue for routine labs. She also needs hepatitis A/B vaccination as well as pneumonia vaccine.   Past Medical History:  Diagnosis Date   Anxiety    Cirrhosis ([place])    Depression    GERD (gastroesophageal reflux disease)    with grade d esophagitis in March 2023.   Hypercholesteremia    Hypertension    Hypothyroidism    Trimalleolar fracture of ankle, closed, right, sequela    Type 2 diabetes mellitus ([place])     Past Surgical History:  Procedure Laterality Date   ABDOMINAL HYSTERECTOMY     BIOPSY  09/21/2021   Procedure: BIOPSY;  Surgeon: Harvel Quale, MD;  [facility];  Service: Gastroenterology;;   CARDIAC CATHETERIZATION N/A 09/10/2015   Procedure: Left Heart Cath and Coronary Angiography;  Surgeon: Jettie Booze, MD;  Location: [place] CV LAB;  Service: Cardiovascular;  Laterality: N/A;   CHOLECYSTECTOMY     ESOPHAGEAL BRUSHING  09/21/2021   Procedure: ESOPHAGEAL BRUSHING;  Surgeon: Montez Morita, Quillian Quince, MD;  [facility];  Service: Gastroenterology;;   ESOPHAGOGASTRODUODENOSCOPY (EGD) WITH PROPOFOL N/A 09/21/2021   Surgeon: Harvel Quale, MD;  grade D esophagitis with scant bleeding, KOH negative, could not rule out small varices, but no medium to  large varices, mild portal hypertensive gastropathy.  Pathology showed mild foveolar hyperplasia, negative for H. pylori.  Recommended repeat EGD in 3 months.   OPEN REDUCTION INTERNAL FIXATION (ORIF) TIBIA/FIBULA FRACTURE Right 08/04/2016   Procedure: OPEN REDUCTION INTERNAL FIXATION (ORIF) RIGHT TRIMALLEOLAR FRACTURE;  Surgeon: Wylene Simmer, MD;  Location: [place];  Service: Orthopedics;   Laterality: Right;   WRIST SURGERY      Current Outpatient Medications  Medication Sig Dispense Refill   albuterol (VENTOLIN HFA) 108 (90 Base) MCG/ACT inhaler Inhale 1-2 puffs into the lungs every 6 (six) hours as needed for wheezing or shortness of breath. 18 g 0   amLODipine (NORVASC) 5 MG tablet Take 5 mg by mouth daily.     Cholecalciferol (VITAMIN D3 PO) Take 1 capsule by mouth daily.     ELDERBERRY PO Take 1 tablet by mouth daily.     FLUoxetine (PROZAC) 40 MG capsule Take 40 mg by mouth daily.     fluticasone (FLOVENT HFA) 110 MCG/ACT inhaler Inhale 2 puffs into the lungs in the morning and at bedtime for 10 days. 1 each 12   HYDROcodone bit-homatropine (HYCODAN) 5-1.5 MG/5ML syrup Take 5 mLs by mouth every 4 (four) hours as needed.     lactulose (CHRONULAC) 10 GM/15ML solution Take 30 mLs (20 g total) by mouth daily as needed for mild constipation. Goal of 2-3 soft bowel movements daily. 236 mL 0   loratadine (CLARITIN) 10 MG tablet Take 10 mg by mouth daily.     metFORMIN (GLUCOPHAGE) 500 MG tablet Take 500 mg by mouth 2 (two) times daily with a meal.     pantoprazole (PROTONIX) 40 MG tablet Take 1 tablet (40 mg total) by mouth 2 (two) times daily before a meal. 60 tablet 2   vitamin C (ASCORBIC ACID) 500 MG tablet Take 500 mg by mouth daily.      zinc gluconate 50 MG tablet Take 50 mg by mouth daily.     No current facility-administered medications for this visit.    Allergies as of 09/05/2022 - Review Complete 08/29/2022  Allergen Reaction Noted   Codeine Nausea And Vomiting 11/03/2009   Dilaudid [hydromorphone hcl]  03/28/2013   Tape Swelling and Other (See Comments) 02/21/2015   Vicodin [hydrocodone-acetaminophen] Nausea And Vomiting 03/05/2012   Latex Rash 12/06/2016    Family History  Problem Relation Age of Onset   Hypertension Mother    Cirrhosis Mother        NASH   Coronary artery disease Father    Heart disease Father    Barrett's esophagus Father     Coronary artery disease Paternal Uncle    Coronary artery disease Cousin     Social History   Socioeconomic History   Marital status: Married    Spouse name: Not on file   Number of children: 2   Years of education: Not on file   Highest education level: Not on file  Occupational History   Occupation: property mgmt    Employer: [employer]  Tobacco Use   Smoking status: Former    Types: Cigarettes    Quit date: 05/20/1991    Years since quitting: 31.3   Smokeless tobacco: Never   Tobacco comments:    Used to smoke a pack in 1-2 weeks/ 1992  Vaping Use   Vaping Use: Never used  Substance and Sexual Activity   Alcohol use: Not Currently   Drug use: No   Sexual activity: Yes  Birth control/protection: Surgical  Other Topics Concern   Not on file  Social History Narrative   Not on file   Social Determinants of Health   Financial Resource Strain: Not on file  Food Insecurity: Not on file  Transportation Needs: Not on file  Physical Activity: Not on file  Stress: Not on file  Social Connections: Not on file    Review of Systems: Gen: Denies fever, chills, anorexia. Denies fatigue, weakness, weight loss.  CV: Denies chest pain, palpitations, syncope, peripheral edema, and claudication. Resp: Denies dyspnea at rest, cough, wheezing, coughing up blood, and pleurisy. GI: Denies vomiting blood, jaundice, and fecal incontinence.   Denies dysphagia or odynophagia. Derm: Denies rash, itching, dry skin Psych: Denies depression, anxiety, memory loss, confusion. No homicidal or suicidal ideation.  Heme: Denies bruising, bleeding, and enlarged lymph nodes.  Physical Exam: There were no vitals taken for this visit. General:   Alert and oriented. No distress noted. Pleasant and cooperative.  Head:  Normocephalic and atraumatic. Eyes:  Conjuctiva clear without scleral icterus. Heart:  S1, S2 present without murmurs appreciated. Lungs:  Clear to auscultation bilaterally. No  wheezes, rales, or rhonchi. No distress.  Abdomen:  +BS, soft, non-tender and non-distended. No rebound or guarding. No HSM or masses noted. Msk:  Symmetrical without gross deformities. Normal posture. Extremities:  Without edema. Neurologic:  Alert and  oriented x4 Psych:  Normal mood and affect.    Assessment:     Plan:  ***   Aliene Altes, PA-C [facility] Gastroenterology 09/05/2022

## 2022-09-05 ENCOUNTER — Ambulatory Visit (INDEPENDENT_AMBULATORY_CARE_PROVIDER_SITE_OTHER): Payer: Medicaid Other | Admitting: Gastroenterology

## 2022-09-05 ENCOUNTER — Encounter: Payer: Self-pay | Admitting: Gastroenterology

## 2022-09-05 VITALS — BP 138/80 | HR 87 | Temp 97.9°F | Ht 64.0 in | Wt 285.4 lb

## 2022-09-05 DIAGNOSIS — Z1211 Encounter for screening for malignant neoplasm of colon: Secondary | ICD-10-CM

## 2022-09-05 DIAGNOSIS — K2100 Gastro-esophageal reflux disease with esophagitis, without bleeding: Secondary | ICD-10-CM

## 2022-09-05 DIAGNOSIS — K7460 Unspecified cirrhosis of liver: Secondary | ICD-10-CM

## 2022-09-05 MED ORDER — PANTOPRAZOLE SODIUM 40 MG PO TBEC: 40.0000 mg | DELAYED_RELEASE_TABLET | Freq: Two times a day (BID) | ORAL | 3 refills | Status: DC

## 2022-09-05 NOTE — Patient Instructions (Addendum)
Please have labs completed at [place].   We will arrange for you to have an [place] of your abdomen.   We will arrange for you to have an upper endoscopy and colonoscopy in the near future with Dr. Abbey Chatters. Take 1/2 dose of metformin the day before.  Hold monjuaro for 1 week prior to your procedure.   Have Hepatitis A/B vaccine series completed.   Nutrition:  High-protein diet from a primarily plant-based diet. Avoid red meat.  No raw or undercooked meat, seafood, or shellfish. Low-fat/cholesterol/carbohydrate diet. Limit sodium to no more than 2000 mg/day including everything that you eat and drink. Recommend at least 30 minutes of aerobic and resistance exercise 3 days/week.  Restart Protonix 40 mg twice daily 30 minutes before breakfast and dinner.    Follow a GERD diet:  Avoid fried, fatty, greasy, spicy, citrus foods. Avoid caffeine and carbonated beverages. Avoid chocolate. Try eating 4-6 small meals a day rather than 3 large meals. Do not eat within 3 hours of laying down. Prop head of bed up on wood or bricks to create a 6 inch incline.  We will see you back in about 3 months. Do not hesitate to call if you have questions or concerns.   It was good to see you again today!   Aliene Altes, PA-C [facility] Gastroenterology

## 2022-09-06 ENCOUNTER — Encounter: Payer: Self-pay | Admitting: *Deleted

## 2022-09-06 ENCOUNTER — Telehealth: Payer: Self-pay | Admitting: *Deleted

## 2022-09-06 ENCOUNTER — Other Ambulatory Visit: Payer: Self-pay | Admitting: *Deleted

## 2022-09-06 MED ORDER — PEG 3350-KCL-NA BICARB-NACL 420 G PO SOLR: 4000.0000 mL | Freq: Once | ORAL | 0 refills | Status: AC

## 2022-09-06 NOTE — Telephone Encounter (Signed)
[employer] medicaid PA: Pending, The prior authorization/notification reference number is: [id number].

## 2022-09-07 LAB — BASIC METABOLIC PANEL
BUN: 8 mg/dL (ref 7–25)
CO2: 30 mmol/L (ref 20–32)
Calcium: 9.1 mg/dL (ref 8.6–10.4)
Chloride: 110 mmol/L (ref 98–110)
Creat: 0.54 mg/dL (ref 0.50–1.03)
Glucose, Bld: 143 mg/dL — ABNORMAL HIGH (ref 65–99)
Potassium: 3.9 mmol/L (ref 3.5–5.3)
Sodium: 143 mmol/L (ref 135–146)

## 2022-09-07 LAB — HEPATIC FUNCTION PANEL
AG Ratio: 1.7 (calc) (ref 1.0–2.5)
ALT: 18 U/L (ref 6–29)
AST: 30 U/L (ref 10–35)
Albumin: 3.3 g/dL — ABNORMAL LOW (ref 3.6–5.1)
Alkaline phosphatase (APISO): 113 U/L (ref 37–153)
Bilirubin, Direct: 0.4 mg/dL — ABNORMAL HIGH (ref 0.0–0.2)
Globulin: 2 g/dL (calc) (ref 1.9–3.7)
Indirect Bilirubin: 1.3 mg/dL (calc) — ABNORMAL HIGH (ref 0.2–1.2)
Total Bilirubin: 1.7 mg/dL — ABNORMAL HIGH (ref 0.2–1.2)
Total Protein: 5.3 g/dL — ABNORMAL LOW (ref 6.1–8.1)

## 2022-09-07 LAB — PROTIME-INR
INR: 1.3 — ABNORMAL HIGH
Prothrombin Time: 13.8 s — ABNORMAL HIGH (ref 9.0–11.5)

## 2022-09-07 LAB — AFP TUMOR MARKER: AFP-Tumor Marker: 5.2 ng/mL

## 2022-09-09 NOTE — Telephone Encounter (Signed)
[employer] PA: approval # [id number] DOS: 09/23/22-12/22/22

## 2022-09-15 ENCOUNTER — Ambulatory Visit (HOSPITAL_COMMUNITY)
Admission: RE | Admit: 2022-09-15 | Discharge: 2022-09-15 | Disposition: A | Discharge status: Home / Self Care | Payer: Medicaid Other | Source: Ambulatory Visit | Attending: Gastroenterology | Admitting: Gastroenterology

## 2022-09-15 DIAGNOSIS — K7460 Unspecified cirrhosis of liver: Secondary | ICD-10-CM | POA: Insufficient documentation

## 2022-09-19 ENCOUNTER — Other Ambulatory Visit: Payer: Self-pay | Admitting: Gastroenterology

## 2022-09-19 ENCOUNTER — Other Ambulatory Visit: Payer: Self-pay | Admitting: *Deleted

## 2022-09-19 DIAGNOSIS — R7989 Other specified abnormal findings of blood chemistry: Secondary | ICD-10-CM

## 2022-09-19 DIAGNOSIS — K7460 Unspecified cirrhosis of liver: Secondary | ICD-10-CM

## 2022-09-19 MED ORDER — SPIRONOLACTONE 50 MG PO TABS: 50.0000 mg | ORAL_TABLET | Freq: Every day | ORAL | 3 refills | Status: DC

## 2022-09-19 MED ORDER — FUROSEMIDE 20 MG PO TABS: 20.0000 mg | ORAL_TABLET | Freq: Every day | ORAL | 3 refills | Status: DC

## 2022-09-19 NOTE — Patient Instructions (Signed)
Candice Stanley  09/19/2022     '@PREFPERIOPPHARMACY'$ @   Your procedure is scheduled on  09/23/2022.   Report to [facility] at  1030  A.M.   Call this number if you have problems the morning of surgery:  [phone]  If you experience any cold or flu symptoms such as cough, fever, chills, shortness of breath, etc. between now and your scheduled surgery, please notify us at the above number.   Remember:  Follow the diet and prep instructions given to you by the office.     Take these medicines the morning of surgery with A SIP OF WATER                  amlodipine, prozac, claritin, pantoprazole.     Do not wear jewelry, make-up or nail polish.  Do not wear lotions, powders, or perfumes, or deodorant.  Do not shave 48 hours prior to surgery.  Men may shave face and neck.  Do not bring valuables to the hospital.  [facility] is not responsible for any belongings or valuables.  Contacts, dentures or bridgework may not be worn into surgery.  Leave your suitcase in the car.  After surgery it may be brought to your room.  For patients admitted to the hospital, discharge time will be determined by your treatment team.  Patients discharged the day of surgery will not be allowed to drive home and must have someone with them for 24 hours.     Special instructions:   DO NOT smoke tobacco or vape for 24 hours before your procedure.  Please read over the following fact sheets that you were given. Anesthesia Post-op Instructions and Care and Recovery After Surgery      Upper Endoscopy, Adult, Care After After the procedure, it is common to have a sore throat. It is also common to have: Mild stomach pain or discomfort. Bloating. Nausea. Follow these instructions at home: The instructions below may help you care for yourself at home. Your health care provider may give you more instructions. If you have questions, ask your health care provider. If you were given a sedative  during the procedure, it can affect you for several hours. Do not drive or operate machinery until your health care provider says that it is safe. If you will be going home right after the procedure, plan to have a responsible adult: Take you home from the hospital or clinic. You will not be allowed to drive. Care for you for the time you are told. Follow instructions from your health care provider about what you may eat and drink. Return to your normal activities as told by your health care provider. Ask your health care provider what activities are safe for you. Take over-the-counter and prescription medicines only as told by your health care provider. Contact a health care provider if you: Have a sore throat that lasts longer than one day. Have trouble swallowing. Have a fever. Get help right away if you: Vomit blood or your vomit looks like coffee grounds. Have bloody, black, or tarry stools. Have a very bad sore throat or you cannot swallow. Have difficulty breathing or very bad pain in your chest or abdomen. These symptoms may be an emergency. Get help right away. Call 911. Do not wait to see if the symptoms will go away. Do not drive yourself to the hospital. Summary After the procedure, it is common to have a sore throat,  mild stomach discomfort, bloating, and nausea. If you were given a sedative during the procedure, it can affect you for several hours. Do not drive until your health care provider says that it is safe. Follow instructions from your health care provider about what you may eat and drink. Return to your normal activities as told by your health care provider. This information is not intended to replace advice given to you by your health care provider. Make sure you discuss any questions you have with your health care provider. Document Revised: 10/06/2021 Document Reviewed: 10/06/2021 Elsevier Patient Education  [place]. Colonoscopy, Adult, Care After The  following information offers guidance on how to care for yourself after your procedure. Your health care provider may also give you more specific instructions. If you have problems or questions, contact your health care provider. What can I expect after the procedure? After the procedure, it is common to have: A small amount of blood in your stool for 24 hours after the procedure. Some gas. Mild cramping or bloating of your abdomen. Follow these instructions at home: Eating and drinking  Drink enough fluid to keep your urine pale yellow. Follow instructions from your health care provider about eating or drinking restrictions. Resume your normal diet as told by your health care provider. Avoid heavy or fried foods that are hard to digest. Activity Rest as told by your health care provider. Avoid sitting for a long time without moving. Get up to take short walks every 1-2 hours. This is important to improve blood flow and breathing. Ask for help if you feel weak or unsteady. Return to your normal activities as told by your health care provider. Ask your health care provider what activities are safe for you. Managing cramping and bloating  Try walking around when you have cramps or feel bloated. If directed, apply heat to your abdomen as told by your health care provider. Use the heat source that your health care provider recommends, such as a moist heat pack or a heating pad. Place a towel between your skin and the heat source. Leave the heat on for 20-30 minutes. Remove the heat if your skin turns bright red. This is especially important if you are unable to feel pain, heat, or cold. You have a greater risk of getting burned. General instructions If you were given a sedative during the procedure, it can affect you for several hours. Do not drive or operate machinery until your health care provider says that it is safe. For the first 24 hours after the procedure: Do not sign important  documents. Do not drink alcohol. Do your regular daily activities at a slower pace than normal. Eat soft foods that are easy to digest. Take over-the-counter and prescription medicines only as told by your health care provider. Keep all follow-up visits. This is important. Contact a health care provider if: You have blood in your stool 2-3 days after the procedure. Get help right away if: You have more than a small spotting of blood in your stool. You have large blood clots in your stool. You have swelling of your abdomen. You have nausea or vomiting. You have a fever. You have increasing pain in your abdomen that is not relieved with medicine. These symptoms may be an emergency. Get help right away. Call 911. Do not wait to see if the symptoms will go away. Do not drive yourself to the hospital. Summary After the procedure, it is common to have a small amount  of blood in your stool. You may also have mild cramping and bloating of your abdomen. If you were given a sedative during the procedure, it can affect you for several hours. Do not drive or operate machinery until your health care provider says that it is safe. Get help right away if you have a lot of blood in your stool, nausea or vomiting, a fever, or increased pain in your abdomen. This information is not intended to replace advice given to you by your health care provider. Make sure you discuss any questions you have with your health care provider. Document Revised: 02/17/2021 Document Reviewed: 02/17/2021 Elsevier Patient Education  [place] After The following information offers guidance on how to care for yourself after your procedure. Your health care provider may also give you more specific instructions. If you have problems or questions, contact your health care provider. What can I expect after the procedure? After the procedure, it is common to have: Tiredness. Little or no  memory about what happened during or after the procedure. Impaired judgment when it comes to making decisions. Nausea or vomiting. Some trouble with balance. Follow these instructions at home: For the time period you were told by your health care provider:  Rest. Do not participate in activities where you could fall or become injured. Do not drive or use machinery. Do not drink alcohol. Do not take sleeping pills or medicines that cause drowsiness. Do not make important decisions or sign legal documents. Do not take care of children on your own. Medicines Take over-the-counter and prescription medicines only as told by your health care provider. If you were prescribed antibiotics, take them as told by your health care provider. Do not stop using the antibiotic even if you start to feel better. Eating and drinking Follow instructions from your health care provider about what you may eat and drink. Drink enough fluid to keep your urine pale yellow. If you vomit: Drink clear fluids slowly and in small amounts as you are able. Clear fluids include water, ice chips, low-calorie sports drinks, and fruit juice that has water added to it (diluted fruit juice). Eat light and bland foods in small amounts as you are able. These foods include bananas, applesauce, rice, lean meats, toast, and crackers. General instructions  Have a responsible adult stay with you for the time you are told. It is important to have someone help care for you until you are awake and alert. If you have sleep apnea, surgery and some medicines can increase your risk for breathing problems. Follow instructions from your health care provider about wearing your sleep device: When you are sleeping. This includes during daytime naps. While taking prescription pain medicines, sleeping medicines, or medicines that make you drowsy. Do not use any products that contain nicotine or tobacco. These products include cigarettes, chewing  tobacco, and vaping devices, such as e-cigarettes. If you need help quitting, ask your health care provider. Contact a health care provider if: You feel nauseous or vomit every time you eat or drink. You feel light-headed. You are still sleepy or having trouble with balance after 24 hours. You get a rash. You have a fever. You have redness or swelling around the IV site. Get help right away if: You have trouble breathing. You have new confusion after you get home. These symptoms may be an emergency. Get help right away. Call 911. Do not wait to see if the symptoms will go away. Do  not drive yourself to the hospital. This information is not intended to replace advice given to you by your health care provider. Make sure you discuss any questions you have with your health care provider. Document Revised: 11/22/2021 Document Reviewed: 11/22/2021 Elsevier Patient Education  [place].

## 2022-09-21 ENCOUNTER — Other Ambulatory Visit: Payer: Self-pay

## 2022-09-21 ENCOUNTER — Encounter (HOSPITAL_COMMUNITY)
Admission: RE | Admit: 2022-09-21 | Discharge: 2022-09-21 | Disposition: A | Discharge status: Home / Self Care | Payer: Medicaid Other | Source: Ambulatory Visit | Attending: Internal Medicine | Admitting: Internal Medicine

## 2022-09-21 ENCOUNTER — Encounter (HOSPITAL_COMMUNITY): Payer: Self-pay

## 2022-09-21 DIAGNOSIS — I10 Essential (primary) hypertension: Secondary | ICD-10-CM | POA: Diagnosis not present

## 2022-09-21 DIAGNOSIS — Z01810 Encounter for preprocedural cardiovascular examination: Secondary | ICD-10-CM | POA: Insufficient documentation

## 2022-09-23 ENCOUNTER — Ambulatory Visit (HOSPITAL_COMMUNITY): Payer: Medicaid Other

## 2022-09-23 ENCOUNTER — Encounter (HOSPITAL_COMMUNITY)
Admission: RE | Disposition: A | Discharge status: Home / Self Care | Payer: Self-pay | Source: Home / Self Care | Attending: Internal Medicine

## 2022-09-23 ENCOUNTER — Ambulatory Visit (HOSPITAL_BASED_OUTPATIENT_CLINIC_OR_DEPARTMENT_OTHER): Payer: Medicaid Other

## 2022-09-23 ENCOUNTER — Ambulatory Visit (HOSPITAL_COMMUNITY)
Admission: RE | Admit: 2022-09-23 | Discharge: 2022-09-23 | Disposition: A | Discharge status: Home / Self Care | Payer: Medicaid Other | Attending: Internal Medicine | Admitting: Internal Medicine

## 2022-09-23 ENCOUNTER — Encounter (HOSPITAL_COMMUNITY): Payer: Self-pay

## 2022-09-23 DIAGNOSIS — K648 Other hemorrhoids: Secondary | ICD-10-CM

## 2022-09-23 DIAGNOSIS — I8510 Secondary esophageal varices without bleeding: Secondary | ICD-10-CM | POA: Insufficient documentation

## 2022-09-23 DIAGNOSIS — D124 Benign neoplasm of descending colon: Secondary | ICD-10-CM | POA: Diagnosis not present

## 2022-09-23 DIAGNOSIS — D125 Benign neoplasm of sigmoid colon: Secondary | ICD-10-CM

## 2022-09-23 DIAGNOSIS — E119 Type 2 diabetes mellitus without complications: Secondary | ICD-10-CM | POA: Diagnosis not present

## 2022-09-23 DIAGNOSIS — E039 Hypothyroidism, unspecified: Secondary | ICD-10-CM | POA: Diagnosis not present

## 2022-09-23 DIAGNOSIS — K2101 Gastro-esophageal reflux disease with esophagitis, with bleeding: Secondary | ICD-10-CM | POA: Diagnosis not present

## 2022-09-23 DIAGNOSIS — K7460 Unspecified cirrhosis of liver: Secondary | ICD-10-CM

## 2022-09-23 DIAGNOSIS — Z09 Encounter for follow-up examination after completed treatment for conditions other than malignant neoplasm: Secondary | ICD-10-CM | POA: Diagnosis not present

## 2022-09-23 DIAGNOSIS — K3189 Other diseases of stomach and duodenum: Secondary | ICD-10-CM

## 2022-09-23 DIAGNOSIS — K2100 Gastro-esophageal reflux disease with esophagitis, without bleeding: Secondary | ICD-10-CM | POA: Diagnosis not present

## 2022-09-23 DIAGNOSIS — Z1211 Encounter for screening for malignant neoplasm of colon: Secondary | ICD-10-CM

## 2022-09-23 DIAGNOSIS — K5730 Diverticulosis of large intestine without perforation or abscess without bleeding: Secondary | ICD-10-CM

## 2022-09-23 DIAGNOSIS — K766 Portal hypertension: Secondary | ICD-10-CM

## 2022-09-23 DIAGNOSIS — E7800 Pure hypercholesterolemia, unspecified: Secondary | ICD-10-CM | POA: Insufficient documentation

## 2022-09-23 DIAGNOSIS — Z87891 Personal history of nicotine dependence: Secondary | ICD-10-CM | POA: Insufficient documentation

## 2022-09-23 DIAGNOSIS — K851 Biliary acute pancreatitis without necrosis or infection: Secondary | ICD-10-CM

## 2022-09-23 DIAGNOSIS — I10 Essential (primary) hypertension: Secondary | ICD-10-CM | POA: Insufficient documentation

## 2022-09-23 DIAGNOSIS — Z6841 Body Mass Index (BMI) 40.0 and over, adult: Secondary | ICD-10-CM | POA: Insufficient documentation

## 2022-09-23 DIAGNOSIS — F418 Other specified anxiety disorders: Secondary | ICD-10-CM

## 2022-09-23 HISTORY — PX: POLYPECTOMY: SHX149

## 2022-09-23 HISTORY — PX: ESOPHAGOGASTRODUODENOSCOPY (EGD) WITH PROPOFOL: SHX5813

## 2022-09-23 HISTORY — PX: COLONOSCOPY WITH PROPOFOL: SHX5780

## 2022-09-23 LAB — GLUCOSE, CAPILLARY: Glucose-Capillary: 112 mg/dL — ABNORMAL HIGH (ref 70–99)

## 2022-09-23 SURGERY — COLONOSCOPY WITH PROPOFOL
Anesthesia: General

## 2022-09-23 MED ORDER — PROPOFOL 500 MG/50ML IV EMUL
INTRAVENOUS | Status: DC | PRN
  Administered 2022-09-23: 180 ug/kg/min via INTRAVENOUS

## 2022-09-23 MED ORDER — PROPOFOL 500 MG/50ML IV EMUL
INTRAVENOUS | Status: AC
  Filled 2022-09-23: qty 50

## 2022-09-23 MED ORDER — PROPOFOL 10 MG/ML IV BOLUS
INTRAVENOUS | Status: DC | PRN
  Administered 2022-09-23: 120 mg via INTRAVENOUS

## 2022-09-23 MED ORDER — LACTATED RINGERS IV SOLN: INTRAVENOUS | Status: DC | PRN

## 2022-09-23 NOTE — Op Note (Signed)
[facility] Patient Name: Candice Stanley Procedure Date: 09/23/2022 12:25 PM MRN: [id number] Date of Birth: 03-27-1966 Attending MD: Elon Alas. Abbey Chatters , [place], [id number] CSN: [id number] Age: 57 Admit Type: Outpatient Procedure:                Upper GI endoscopy Indications:              Cirrhosis rule out esophageal varices, Follow-up of                            reflux esophagitis Providers:                Elon Alas. Abbey Chatters, DO, [place] Page, [place]                            Theda Sers RN, RN, Aram Candela Referring MD:              Medicines:                See the Anesthesia note for documentation of the                            administered medications Complications:            No immediate complications. Estimated Blood Loss:     Estimated blood loss: none. Procedure:                Pre-Anesthesia Assessment:                           - The anesthesia plan was to use monitored                            anesthesia care (MAC).                           After obtaining informed consent, the endoscope was                            passed under direct vision. Throughout the                            procedure, the patient's blood pressure, pulse, and                            oxygen saturations were monitored continuously. The                            [id number]) scope was introduced through the                            mouth, and advanced to the second part of duodenum.                            The upper GI endoscopy was accomplished without                            difficulty. The  patient tolerated the procedure                            well. Scope In: 12:35:28 PM Scope Out: 12:38:17 PM Total Procedure Duration: 0 hours 2 minutes 49 seconds  Findings:      Three columns of grade I varices with no bleeding and no stigmata of       recent bleeding were found in the lower third of the esophagus. No red       wale signs were present. Previously noted  esophagitis has completely       healed.      Mild portal hypertensive gastropathy was found in the entire examined       stomach.      The duodenal bulb, first portion of the duodenum and second portion of       the duodenum were normal. Impression:               - Grade I esophageal varices with no bleeding and                            no stigmata of recent bleeding.                           - Portal hypertensive gastropathy.                           - Normal duodenal bulb, first portion of the                            duodenum and second portion of the duodenum.                           - No specimens collected. Moderate Sedation:      Per Anesthesia Care Recommendation:           - Patient has a contact number available for                            emergencies. The signs and symptoms of potential                            delayed complications were discussed with the                            patient. Return to normal activities tomorrow.                            Written discharge instructions were provided to the                            patient.                           - Resume previous diet.                           - Continue present medications.                           -  Use a proton pump inhibitor PO BID.                           - Return to GI clinic in 3 months.                           - Consider NSBB for primary ppx Procedure Code(s):        --- Professional ---                           [phone], Esophagogastroduodenoscopy, flexible,                            transoral; diagnostic, including collection of                            specimen(s) by brushing or washing, when performed                            (separate procedure) Diagnosis Code(s):        --- Professional ---                           K74.60, Unspecified cirrhosis of liver                           I85.10, Secondary esophageal varices without                            bleeding                            K76.6, Portal hypertension                           K31.89, Other diseases of stomach and duodenum                           K21.00, Gastro-esophageal reflux disease with                            esophagitis, without bleeding CPT copyright 2022 American Medical Association. All rights reserved. The codes documented in this report are preliminary and upon coder review may  be revised to meet current compliance requirements. Elon Alas. Abbey Chatters, DO [place] Abbey Chatters, DO 09/23/2022 12:42:15 PM This report has been signed electronically. Number of Addenda: 0

## 2022-09-23 NOTE — Anesthesia Preprocedure Evaluation (Signed)
Anesthesia Evaluation  Patient identified by MRN, date of birth, ID band Patient awake    Reviewed: Allergy & Precautions, H&P , NPO status , Patient's Chart, lab work & pertinent test results, reviewed documented beta blocker date and time   Airway Mallampati: II  TM Distance: >3 FB Neck ROM: full    Dental no notable dental hx.    Pulmonary neg pulmonary ROS, former smoker   Pulmonary exam normal breath sounds clear to auscultation       Cardiovascular Exercise Tolerance: Good hypertension, negative cardio ROS  Rhythm:regular Rate:Normal     Neuro/Psych  PSYCHIATRIC DISORDERS Anxiety Depression     Neuromuscular disease    GI/Hepatic Neg liver ROS,GERD  Medicated,,  Endo/Other  diabetes, Type 2Hypothyroidism  Morbid obesity  Renal/GU negative Renal ROS  negative genitourinary   Musculoskeletal   Abdominal   Peds  Hematology  (+) Blood dyscrasia, anemia   Anesthesia Other Findings   Reproductive/Obstetrics negative OB ROS                             Anesthesia Physical Anesthesia Plan  ASA: 3  Anesthesia Plan: General   Post-op Pain Management:    Induction:   PONV Risk Score and Plan: Propofol infusion  Airway Management Planned:   Additional Equipment:   Intra-op Plan:   Post-operative Plan:   Informed Consent: I have reviewed the patients History and Physical, chart, labs and discussed the procedure including the risks, benefits and alternatives for the proposed anesthesia with the patient or authorized representative who has indicated his/her understanding and acceptance.     Dental Advisory Given  Plan Discussed with: CRNA  Anesthesia Plan Comments:        Anesthesia Quick Evaluation

## 2022-09-23 NOTE — Transfer of Care (Signed)
Immediate Anesthesia Transfer of Care Note  Patient: Candice Stanley  Procedure(s) Performed: COLONOSCOPY WITH PROPOFOL ESOPHAGOGASTRODUODENOSCOPY (EGD) WITH PROPOFOL POLYPECTOMY INTESTINAL  Patient Location: PACU  Anesthesia Type:General  Level of Consciousness: awake, alert , and oriented  Airway & Oxygen Therapy: Patient Spontanous Breathing  Post-op Assessment: Report given to RN, Post -op Vital signs reviewed and stable, Patient moving all extremities X 4, and Patient able to stick tongue midline  Post vital signs: Reviewed  Last Vitals:  Vitals Value Taken Time  BP 110/57   Temp 98.6   Pulse 70   Resp 14   SpO2 96     Last Pain:  Vitals:   09/23/22 1232  TempSrc:   PainSc: 0-No pain         Complications: No notable events documented.

## 2022-09-23 NOTE — Op Note (Signed)
[facility] Patient Name: Candice Stanley Procedure Date: 09/23/2022 12:38 PM MRN: [id number] Date of Birth: 1966-01-23 Attending MD: Elon Alas. Abbey Chatters , [place], [id number] CSN: [id number] Age: 57 Admit Type: Outpatient Procedure:                Colonoscopy Indications:              Screening for colorectal malignant neoplasm Providers:                Elon Alas. Abbey Chatters, DO, [place] Page, Aram Candela Referring MD:              Medicines:                See the Anesthesia note for documentation of the                            administered medications Complications:            No immediate complications. Estimated Blood Loss:     Estimated blood loss was minimal. Procedure:                Pre-Anesthesia Assessment:                           - The anesthesia plan was to use monitored                            anesthesia care (MAC).                           After obtaining informed consent, the colonoscope                            was passed under direct vision. Throughout the                            procedure, the patient's blood pressure, pulse, and                            oxygen saturations were monitored continuously. The                            [id number]-[id number]) scope was introduced through                            the anus and advanced to the the cecum, identified                            by appendiceal orifice and ileocecal valve. The                            colonoscopy was performed without difficulty. The                            patient tolerated the procedure well. The quality  of the bowel preparation was evaluated using the                            BBPS [facility] Bowel Preparation Scale) with scores                            of: Right Colon = 2 (minor amount of residual                            staining, small fragments of stool and/or opaque                            liquid, but mucosa seen well), Transverse Colon = 2                             (minor amount of residual staining, small fragments                            of stool and/or opaque liquid, but mucosa seen                            well) and Left Colon = 2 (minor amount of residual                            staining, small fragments of stool and/or opaque                            liquid, but mucosa seen well). The total BBPS score                            equals 6. Fair. Scope In: 12:43:02 PM Scope Out: 1:10:18 PM Scope Withdrawal Time: 0 hours 17 minutes 8 seconds  Total Procedure Duration: 0 hours 27 minutes 16 seconds  Findings:      Non-bleeding internal hemorrhoids were found during endoscopy.      Multiple small-mouthed diverticula were found in the sigmoid colon.      Two sessile polyps were found in the sigmoid colon. The polyps were 4 to       5 mm in size. These polyps were removed with a cold snare. Resection and       retrieval were complete.      Liquid stool was found in the entire colon, making visualization       difficult. Lavage of the area was performed using copious amounts of       sterile water, resulting in clearance with fair visualization. Impression:               - Non-bleeding internal hemorrhoids.                           - Diverticulosis in the sigmoid colon.                           - Two 4 to 5 mm polyps in the sigmoid colon,  removed with a cold snare. Resected and retrieved.                           - Stool in the entire examined colon. Moderate Sedation:      Per Anesthesia Care Recommendation:           - Patient has a contact number available for                            emergencies. The signs and symptoms of potential                            delayed complications were discussed with the                            patient. Return to normal activities tomorrow.                            Written discharge instructions were provided to the                             patient.                           - Resume previous diet.                           - Continue present medications.                           - Await pathology results.                           - Repeat colonoscopy in 5 years for surveillance.                           - Return to GI clinic in 3 months. Procedure Code(s):        --- Professional ---                           [phone], Colonoscopy, flexible; with removal of                            tumor(s), polyp(s), or other lesion(s) by snare                            technique Diagnosis Code(s):        --- Professional ---                           Z12.11, Encounter for screening for malignant                            neoplasm of colon                           K64.8, Other hemorrhoids  D12.5, Benign neoplasm of sigmoid colon                           K57.30, Diverticulosis of large intestine without                            perforation or abscess without bleeding CPT copyright 2022 American Medical Association. All rights reserved. The codes documented in this report are preliminary and upon coder review may  be revised to meet current compliance requirements. Elon Alas. Abbey Chatters, DO [place] Abbey Chatters, DO 09/23/2022 1:12:54 PM This report has been signed electronically. Number of Addenda: 0

## 2022-09-23 NOTE — Discharge Instructions (Signed)
EGD Discharge instructions Please read the instructions outlined below and refer to this sheet in the next few weeks. These discharge instructions provide you with general information on caring for yourself after you leave the hospital. Your doctor may also give you specific instructions. While your treatment has been planned according to the most current medical practices available, unavoidable complications occasionally occur. If you have any problems or questions after discharge, please call your doctor. ACTIVITY You may resume your regular activity but move at a slower pace for the next 24 hours.  Take frequent rest periods for the next 24 hours.  Walking will help expel (get rid of) the air and reduce the bloated feeling in your abdomen.  No driving for 24 hours (because of the anesthesia (medicine) used during the test).  You may shower.  Do not sign any important legal documents or operate any machinery for 24 hours (because of the anesthesia used during the test).  NUTRITION Drink plenty of fluids.  You may resume your normal diet.  Begin with a light meal and progress to your normal diet.  Avoid alcoholic beverages for 24 hours or as instructed by your caregiver.  MEDICATIONS You may resume your normal medications unless your caregiver tells you otherwise.  WHAT YOU CAN EXPECT TODAY You may experience abdominal discomfort such as a feeling of fullness or "gas" pains.  FOLLOW-UP Your doctor will discuss the results of your test with you.  SEEK IMMEDIATE MEDICAL ATTENTION IF ANY OF THE FOLLOWING OCCUR: Excessive nausea (feeling sick to your stomach) and/or vomiting.  Severe abdominal pain and distention (swelling).  Trouble swallowing.  Temperature over 101 F (37.8 C).  Rectal bleeding or vomiting of blood.     Colonoscopy Discharge Instructions  Read the instructions outlined below and refer to this sheet in the next few weeks. These discharge instructions provide you with  general information on caring for yourself after you leave the hospital. Your doctor may also give you specific instructions. While your treatment has been planned according to the most current medical practices available, unavoidable complications occasionally occur.   ACTIVITY You may resume your regular activity, but move at a slower pace for the next 24 hours.  Take frequent rest periods for the next 24 hours.  Walking will help get rid of the air and reduce the bloated feeling in your belly (abdomen).  No driving for 24 hours (because of the medicine (anesthesia) used during the test).   Do not sign any important legal documents or operate any machinery for 24 hours (because of the anesthesia used during the test).  NUTRITION Drink plenty of fluids.  You may resume your normal diet as instructed by your doctor.  Begin with a light meal and progress to your normal diet. Heavy or fried foods are harder to digest and may make you feel sick to your stomach (nauseated).  Avoid alcoholic beverages for 24 hours or as instructed.  MEDICATIONS You may resume your normal medications unless your doctor tells you otherwise.  WHAT YOU CAN EXPECT TODAY Some feelings of bloating in the abdomen.  Passage of more gas than usual.  Spotting of blood in your stool or on the toilet paper.  IF YOU HAD POLYPS REMOVED DURING THE COLONOSCOPY: No aspirin products for 7 days or as instructed.  No alcohol for 7 days or as instructed.  Eat a soft diet for the next 24 hours.  FINDING OUT THE RESULTS OF YOUR TEST Not all test results are  available during your visit. If your test results are not back during the visit, make an appointment with your caregiver to find out the results. Do not assume everything is normal if you have not heard from your caregiver or the medical facility. It is important for you to follow up on all of your test results.  SEEK IMMEDIATE MEDICAL ATTENTION IF: You have more than a spotting of  blood in your stool.  Your belly is swollen (abdominal distention).  You are nauseated or vomiting.  You have a temperature over 101.  You have abdominal pain or discomfort that is severe or gets worse throughout the day.   Your upper endoscopy revealed small esophageal varices not amenable to banding.  Previously noted esophagitis has completely healed.  Stomach showed mild inflammation as well as a condition related to liver disease called portal hypertensive gastropathy.  Small bowel appeared normal.  Continue on pantoprazole twice daily.  We may consider starting a medication to prevent bleeding of your varices.  Will discuss further on follow-up visit in our office.  Your colonoscopy revealed 2 polyp(s) which I removed successfully. Await pathology results, my office will contact you. I recommend repeating colonoscopy in 5 years for surveillance purposes.   Follow up in 2-3 months  I hope you have a great rest of your week!  Elon Alas. Abbey Chatters, D.O. Gastroenterology and Hepatology [facility]

## 2022-09-23 NOTE — Interval H&P Note (Signed)
History and Physical Interval Note:  09/23/2022 12:20 PM  Candice Stanley  has presented today for surgery, with the diagnosis of screening for colon cancer/GERD/cirrhosis.  The various methods of treatment have been discussed with the patient and family. After consideration of risks, benefits and other options for treatment, the patient has consented to  Procedure(s) with comments: COLONOSCOPY WITH PROPOFOL (N/A) - 1:15 pn, asa 3 ESOPHAGOGASTRODUODENOSCOPY (EGD) WITH PROPOFOL (N/A) as a surgical intervention.  The patient's history has been reviewed, patient examined, no change in status, stable for surgery.  I have reviewed the patient's chart and labs.  Questions were answered to the patient's satisfaction.     [name]

## 2022-09-24 NOTE — Anesthesia Postprocedure Evaluation (Signed)
Anesthesia Post Note  Patient: Candice Stanley  Procedure(s) Performed: COLONOSCOPY WITH PROPOFOL ESOPHAGOGASTRODUODENOSCOPY (EGD) WITH PROPOFOL POLYPECTOMY INTESTINAL  Patient location during evaluation: Phase II Anesthesia Type: General Level of consciousness: awake Pain management: pain level controlled Vital Signs Assessment: post-procedure vital signs reviewed and stable Respiratory status: spontaneous breathing and respiratory function stable Cardiovascular status: blood pressure returned to baseline and stable Postop Assessment: no headache and no apparent nausea or vomiting Anesthetic complications: no Comments: Late entry   No notable events documented.   Last Vitals:  Vitals:   09/23/22 1133 09/23/22 1315  BP: 103/76 (!) 110/51  Pulse:  71  Resp:  15  Temp:  36.6 C  SpO2:  100%    Last Pain:  Vitals:   09/23/22 1315  TempSrc: Oral  PainSc: 0-No pain                 Louann Sjogren

## 2022-09-26 LAB — SURGICAL PATHOLOGY

## 2022-10-01 LAB — BASIC METABOLIC PANEL
BUN: 7 mg/dL (ref 7–25)
CO2: 25 mmol/L (ref 20–32)
Calcium: 9.1 mg/dL (ref 8.6–10.4)
Chloride: 108 mmol/L (ref 98–110)
Creat: 0.5 mg/dL (ref 0.50–1.03)
Glucose, Bld: 131 mg/dL — ABNORMAL HIGH (ref 65–99)
Potassium: 4 mmol/L (ref 3.5–5.3)
Sodium: 140 mmol/L (ref 135–146)

## 2022-10-04 ENCOUNTER — Encounter (HOSPITAL_COMMUNITY): Payer: Self-pay | Admitting: Internal Medicine

## 2022-10-10 ENCOUNTER — Institutional Professional Consult (permissible substitution) (HOSPITAL_BASED_OUTPATIENT_CLINIC_OR_DEPARTMENT_OTHER): Payer: Medicaid Other | Admitting: Pulmonary Disease

## 2022-11-02 ENCOUNTER — Encounter: Payer: Self-pay | Admitting: Gastroenterology

## 2022-11-08 ENCOUNTER — Institutional Professional Consult (permissible substitution) (HOSPITAL_BASED_OUTPATIENT_CLINIC_OR_DEPARTMENT_OTHER): Payer: Medicaid Other | Admitting: Pulmonary Disease

## 2022-11-15 ENCOUNTER — Other Ambulatory Visit: Payer: Self-pay | Admitting: Gastroenterology

## 2022-11-15 ENCOUNTER — Telehealth: Payer: Self-pay | Admitting: *Deleted

## 2022-11-15 DIAGNOSIS — K7460 Unspecified cirrhosis of liver: Secondary | ICD-10-CM

## 2022-11-15 MED ORDER — SPIRONOLACTONE 50 MG PO TABS: 50.0000 mg | ORAL_TABLET | Freq: Every day | ORAL | 3 refills | Status: DC

## 2022-11-15 NOTE — Telephone Encounter (Signed)
Rx sent 

## 2022-11-15 NOTE — Telephone Encounter (Signed)
Received refill request for spironolactone  50mg . Pt last OV 09/05/2022

## 2022-11-22 NOTE — Progress Notes (Unsigned)
Referring Provider: Benita Stabile, MD Primary Care Physician:  Benita Stabile, MD Primary GI Physician: Dr. Marletta Lor  No chief complaint on file.   HPI:   Candice Stanley is a 57 y.o. 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 ***  Last seen in our office 09/05/2022.  Reported intermittent tightness in her abdomen after starting Mounjaro.  No abdominal pain, nausea, vomiting.  Bowels moving well.  Not taking lactulose.  No encephalopathy.  No overt GI bleeding but noted easy bruising.  Also reported some chronic intermittent swelling in her ankles.  She is having daily reflux that she was out of pantoprazole and was taking over-the-counter Prilosec twice daily.  Plan to update routine cirrhosis labs, ultrasound, and arrange EGD for variceal screening, colonoscopy for screening purposes, recommended hep A/B vaccine, stop omeprazole, start pantoprazole 40 mg twice daily, 40-month follow-up.  Labs completed 09/05/2022 corresponding to MELD 3.0 of 13.  Her INR was slightly elevated at 1.3, albumin was low at 3.3, total bilirubin mildly elevated at 1.7, but primarily indirect.  aFP normal.  Abdominal ultrasound 09/15/2022 with cirrhosis, splenomegaly, mild ascites.  No focal liver lesion.  Recommended starting Lasix 20 mg and spironolactone 50 mg daily.  Repeat BMP 09/30/2022 with kidney function and electrolytes within normal limits.  Colonoscopy 09/23/2022: Nonbleeding internal hemorrhoids, sigmoid diverticulosis, two 4-5 mm polyps in the sigmoid colon, stool in the entire colon.  Recommended repeat colonoscopy in 5 years.  Pathology with tubular adenoma.  EGD 09/23/2022 grade 1 esophageal varices, portal hypertensive gastropathy.  Recommended considering nonselective beta-blocker for primary prophylaxis.  Today:   Cirrhosis:  Labs: Up-to-date in February 2024. MELD 3.0 is 13 in February 2024. [place]:  Up-to-date in March 2024.  No focal liver lesion. Hep A/B vaccination: *** EGD: 09/23/2022 grade 1 esophageal varices, portal hypertensive gastropathy.  Recommended considering nonselective beta-blocker for primary prophylaxis. Ascites/peripheral edema:  Diuretics: Lasix 20 mg and spironolactone 50 mg daily *** Encephalopathy:    GERD:  Past Medical History:  Diagnosis Date   Anxiety    Cirrhosis (HCC)    Depression    GERD (gastroesophageal reflux disease)    with grade d esophagitis in March 2023.   Hypercholesteremia    Hypertension    Hypothyroidism    Trimalleolar fracture of ankle, closed, right, sequela    Type 2 diabetes mellitus (HCC)     Past Surgical History:  Procedure Laterality Date   ABDOMINAL HYSTERECTOMY     BIOPSY  09/21/2021   Procedure: BIOPSY;  Surgeon: Dolores Frame, MD;  [facility];  Service: Gastroenterology;;   CARDIAC CATHETERIZATION N/A 09/10/2015   Procedure: Left Heart Cath and Coronary Angiography;  Surgeon: Corky Crafts, MD;  Location: [facility] INV[facility] CV LAB;  Service: Cardiovascular;  Laterality: N/A;   CHOLECYSTECTOMY     COLONOSCOPY WITH PROPOFOL N/A 09/23/2022   Procedure: COLONOSCOPY WITH PROPOFOL;  Surgeon: Lanelle Bal, DO;  Location: [facility];  Service: Endoscopy;  Laterality: N/A;  1:15 pn, asa 3   ESOPHAGEAL BRUSHING  09/21/2021   Procedure: ESOPHAGEAL BRUSHING;  Surgeon: Marguerita Merles, Reuel Boom, MD;  Location: [facility];  Service: Gastroenterology;;   ESOPHAGOGASTRODUODENOSCOPY (EGD) WITH PROPOFOL N/A 09/21/2021   Surgeon: Dolores Frame, MD;  grade D esophagitis with scant bleeding, KOH negative, could not rule out small varices, but no medium to large varices, mild portal hypertensive gastropathy.  Pathology  showed mild foveolar hyperplasia, negative for H. pylori.  Recommended repeat EGD in 3 months.   ESOPHAGOGASTRODUODENOSCOPY (EGD) WITH PROPOFOL N/A 09/23/2022   Procedure:  ESOPHAGOGASTRODUODENOSCOPY (EGD) WITH PROPOFOL;  Surgeon: Lanelle Bal, DO;  Location: [facility];  Service: Endoscopy;  Laterality: N/A;   OPEN REDUCTION INTERNAL FIXATION (ORIF) TIBIA/FIBULA FRACTURE Right 08/04/2016   Procedure: OPEN REDUCTION INTERNAL FIXATION (ORIF) RIGHT TRIMALLEOLAR FRACTURE;  Surgeon: Toni Arthurs, MD;  Location:  SURGERY CENTER;  Service: Orthopedics;  Laterality: Right;   POLYPECTOMY  09/23/2022   Procedure: POLYPECTOMY INTESTINAL;  Surgeon: Lanelle Bal, DO;  Location: [facility];  Service: Endoscopy;;   WRIST SURGERY Left    ganglion cyst    Current Outpatient Medications  Medication Sig Dispense Refill   albuterol (VENTOLIN HFA) 108 (90 Base) MCG/ACT inhaler Inhale 1-2 puffs into the lungs every 6 (six) hours as needed for wheezing or shortness of breath. 18 g 0   amLODipine (NORVASC) 5 MG tablet Take 5 mg by mouth daily.     calcium carbonate (TUMS - DOSED IN MG ELEMENTAL CALCIUM) 500 MG chewable tablet Chew 2 tablets by mouth daily as needed for indigestion or heartburn.     Cholecalciferol 50 MCG (2000 UT) TABS Take 2,000 Units by mouth daily.     ELDERBERRY PO Take 50 mg by mouth daily.     FLUoxetine (PROZAC) 40 MG capsule Take 40 mg by mouth daily.     fluticasone (FLOVENT HFA) 110 MCG/ACT inhaler Inhale 2 puffs into the lungs in the morning and at bedtime for 10 days. (Patient taking differently: Inhale 2 puffs into the lungs daily as needed (Cough).) 1 each 12   furosemide (LASIX) 20 MG tablet Take 1 tablet (20 mg total) by mouth daily. 30 tablet 3   lactulose (CHRONULAC) 10 GM/15ML solution Take 30 mLs (20 g total) by mouth daily as needed for mild constipation. Goal of 2-3 soft bowel movements daily. 236 mL 0   loratadine (CLARITIN) 10 MG tablet Take 10 mg by mouth daily.     meclizine (ANTIVERT) 25 MG tablet Take 25 mg by mouth 3 (three) times daily as needed for dizziness or nausea.     metFORMIN (GLUCOPHAGE) 500 MG tablet Take  500 mg by mouth 2 (two) times daily with a meal.     Milk Thistle 1000 MG CAPS Take 1,000 mg by mouth daily.     pantoprazole (PROTONIX) 40 MG tablet Take 1 tablet (40 mg total) by mouth 2 (two) times daily before a meal. 60 tablet 3   potassium chloride SA (KLOR-CON M) 20 MEQ tablet Take 20 mEq by mouth daily.     spironolactone (ALDACTONE) 50 MG tablet Take 1 tablet (50 mg total) by mouth daily. 30 tablet 3   tirzepatide (MOUNJARO) 2.5 MG/0.5ML Pen Inject 2.5 mg into the skin once a week. Once a week injection     vitamin C (ASCORBIC ACID) 500 MG tablet Take 500 mg by mouth daily.      zinc gluconate 50 MG tablet Take 50 mg by mouth daily.     No current facility-administered medications for this visit.    Allergies as of 11/23/2022 - Review Complete 09/23/2022  Allergen Reaction Noted   Codeine Nausea And Vomiting 11/03/2009   Dilaudid [hydromorphone hcl]  03/28/2013   Tape Swelling and Other (See Comments) 02/21/2015   Vicodin [hydrocodone-acetaminophen] Nausea And Vomiting 03/05/2012   Latex Rash 12/06/2016    Family History  Problem Relation  Age of Onset   Hypertension Mother    Cirrhosis Mother        NASH   Coronary artery disease Father    Heart disease Father    Barrett's esophagus Father    Coronary artery disease Paternal Uncle    Coronary artery disease Cousin    Colon cancer Neg Hx     Social History   Socioeconomic History   Marital status: Married    Spouse name: Not on file   Number of children: 2   Years of education: Not on file   Highest education level: Not on file  Occupational History   Occupation: property mgmt    Employer: [employer]  Tobacco Use   Smoking status: Former    Packs/day: 0.25    Years: 10.00    Additional pack years: 0.00    Total pack years: 2.50    Types: Cigarettes    Quit date: 05/20/1991    Years since quitting: 31.5   Smokeless tobacco: Never   Tobacco comments:    Used to smoke a pack in 1-2 weeks/ 1992  Vaping  Use   Vaping Use: Never used  Substance and Sexual Activity   Alcohol use: Not Currently   Drug use: No   Sexual activity: Yes    Birth control/protection: Surgical  Other Topics Concern   Not on file  Social History Narrative   Not on file   Social Determinants of Health   Financial Resource Strain: Not on file  Food Insecurity: Not on file  Transportation Needs: Not on file  Physical Activity: Not on file  Stress: Not on file  Social Connections: Not on file    Review of Systems: Gen: Denies fever, chills, anorexia. Denies fatigue, weakness, weight loss.  CV: Denies chest pain, palpitations, syncope, peripheral edema, and claudication. Resp: Denies dyspnea at rest, cough, wheezing, coughing up blood, and pleurisy. GI: Denies vomiting blood, jaundice, and fecal incontinence.   Denies dysphagia or odynophagia. Derm: Denies rash, itching, dry skin Psych: Denies depression, anxiety, memory loss, confusion. No homicidal or suicidal ideation.  Heme: Denies bruising, bleeding, and enlarged lymph nodes.  Physical Exam: There were no vitals taken for this visit. General:   Alert and oriented. No distress noted. Pleasant and cooperative.  Head:  Normocephalic and atraumatic. Eyes:  Conjuctiva clear without scleral icterus. Heart:  S1, S2 present without murmurs appreciated. Lungs:  Clear to auscultation bilaterally. No wheezes, rales, or rhonchi. No distress.  Abdomen:  +BS, soft, non-tender and non-distended. No rebound or guarding. No HSM or masses noted. Msk:  Symmetrical without gross deformities. Normal posture. Extremities:  Without edema. Neurologic:  Alert and  oriented x4 Psych:  Normal mood and affect.    Assessment:     Plan:  ***   Ermalinda Memos, PA-C [facility] 11/23/2022

## 2022-11-23 ENCOUNTER — Encounter: Payer: Self-pay | Admitting: Gastroenterology

## 2022-11-23 ENCOUNTER — Ambulatory Visit (INDEPENDENT_AMBULATORY_CARE_PROVIDER_SITE_OTHER): Payer: Medicaid Other | Admitting: Gastroenterology

## 2022-11-23 ENCOUNTER — Encounter: Payer: Self-pay | Admitting: *Deleted

## 2022-11-23 ENCOUNTER — Telehealth: Payer: Self-pay | Admitting: *Deleted

## 2022-11-23 VITALS — BP 119/80 | HR 88 | Temp 97.8°F | Ht 64.0 in | Wt 285.0 lb

## 2022-11-23 DIAGNOSIS — K5900 Constipation, unspecified: Secondary | ICD-10-CM

## 2022-11-23 DIAGNOSIS — R140 Abdominal distension (gaseous): Secondary | ICD-10-CM | POA: Diagnosis not present

## 2022-11-23 DIAGNOSIS — K7460 Unspecified cirrhosis of liver: Secondary | ICD-10-CM | POA: Diagnosis not present

## 2022-11-23 DIAGNOSIS — R1010 Upper abdominal pain, unspecified: Secondary | ICD-10-CM | POA: Insufficient documentation

## 2022-11-23 DIAGNOSIS — R188 Other ascites: Secondary | ICD-10-CM

## 2022-11-23 DIAGNOSIS — K2100 Gastro-esophageal reflux disease with esophagitis, without bleeding: Secondary | ICD-10-CM

## 2022-11-23 MED ORDER — SPIRONOLACTONE 100 MG PO TABS: 100.0000 mg | ORAL_TABLET | Freq: Every day | ORAL | 3 refills | Status: DC

## 2022-11-23 MED ORDER — FUROSEMIDE 40 MG PO TABS: 40.0000 mg | ORAL_TABLET | Freq: Every day | ORAL | 3 refills | Status: DC

## 2022-11-23 NOTE — Telephone Encounter (Signed)
Called pt. Aware [place] moved up to tomorrow 5/16, arrival 830am.

## 2022-11-23 NOTE — Patient Instructions (Addendum)
We will arrange for you to have an [place] of your abdomen to evaluate for ascites (fluid in your abdomen).   Increase Lasix to 40 mg daily.   Increase Spironolactone 100 mg daily.   Have blood work completed in 1 week to recheck your clotting function, kidney function, and electrolytes.    Nutrition:  High-protein diet from a primarily plant-based diet. Avoid red meat.  No raw or undercooked meat, seafood, or shellfish. Low-fat/cholesterol/carbohydrate diet. Limit sodium to no more than 2000 mg/day including everything that you eat and drink. Recommend at least 30 minutes of aerobic and resistance exercise 3 days/week.  Continue to use Lactulose 30 ml as needed. Be sure to take this if you do not have 1-2 Bms every day.    Continue Pantoprazole 40 mg twice daily.   We will plan to see you back in about 4 weeks. Do not hesitate to call sooner if you have questions or concerns.   Ermalinda Memos, PA-C [facility]

## 2022-11-24 ENCOUNTER — Ambulatory Visit (HOSPITAL_COMMUNITY)
Admission: RE | Admit: 2022-11-24 | Discharge: 2022-11-24 | Disposition: A | Discharge status: Home / Self Care | Payer: Medicaid Other | Source: Ambulatory Visit | Attending: Gastroenterology | Admitting: Gastroenterology

## 2022-11-24 DIAGNOSIS — R188 Other ascites: Secondary | ICD-10-CM | POA: Diagnosis present

## 2022-11-24 DIAGNOSIS — K7460 Unspecified cirrhosis of liver: Secondary | ICD-10-CM | POA: Diagnosis not present

## 2022-11-30 ENCOUNTER — Encounter: Payer: Self-pay | Admitting: *Deleted

## 2022-12-06 ENCOUNTER — Encounter: Payer: Self-pay | Admitting: *Deleted

## 2022-12-06 ENCOUNTER — Telehealth: Payer: Self-pay | Admitting: Gastroenterology

## 2022-12-06 NOTE — Telephone Encounter (Signed)
Received recent labs 11/18/22 from PCP.  Creatinine 0.65, sodium 142, potassium 3.9, albumin 3.4 (L), total bilirubin 2.4 (H), alkaline phosphatase 124 (H), AST 42 (H), ALT 21, WBC 2.1, hemoglobin 11.4, platelets 43.  Overall, labs fairly stable.   [name]:  Patient was to complete BMP and INR 1 week after her office visit with me on 5/15. It doesn't appear these labs have been completed. Please remind her to have this completed ASAP.

## 2022-12-07 LAB — BASIC METABOLIC PANEL
BUN: 11 mg/dL (ref 7–25)
CO2: 23 mmol/L (ref 20–32)
Calcium: 9.8 mg/dL (ref 8.6–10.4)
Chloride: 105 mmol/L (ref 98–110)
Creat: 0.74 mg/dL (ref 0.50–1.03)
Glucose, Bld: 140 mg/dL — ABNORMAL HIGH (ref 65–139)
Potassium: 4.4 mmol/L (ref 3.5–5.3)
Sodium: 138 mmol/L (ref 135–146)

## 2022-12-07 LAB — PROTIME-INR
INR: 1.3 — ABNORMAL HIGH
Prothrombin Time: 13.5 s — ABNORMAL HIGH (ref 9.0–11.5)

## 2022-12-08 ENCOUNTER — Telehealth: Payer: Self-pay | Admitting: *Deleted

## 2022-12-08 ENCOUNTER — Other Ambulatory Visit: Payer: Self-pay | Admitting: Gastroenterology

## 2022-12-08 DIAGNOSIS — K5900 Constipation, unspecified: Secondary | ICD-10-CM

## 2022-12-08 DIAGNOSIS — K7460 Unspecified cirrhosis of liver: Secondary | ICD-10-CM

## 2022-12-08 MED ORDER — LACTULOSE 10 GM/15ML PO SOLN: 20.0000 g | Freq: Every day | ORAL | 3 refills | Status: AC | PRN

## 2022-12-08 NOTE — Telephone Encounter (Signed)
Pt called and states that she needs lactulose sent to [facility] here in [place].

## 2022-12-08 NOTE — Telephone Encounter (Signed)
Rx sent 

## 2022-12-09 ENCOUNTER — Ambulatory Visit (HOSPITAL_COMMUNITY): Payer: Medicaid Other

## 2022-12-26 NOTE — Progress Notes (Addendum)
Referring Provider: Benita Stabile, MD Primary Care Physician:  Benita Stabile, MD Primary GI Physician: Dr. Marletta Lor  Chief Complaint  Patient presents with   Follow-up    Not having regular bowel movements     HPI:   Candice Stanley is a 57 y.o. 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   Last seen in the office 11/23/2022.  Reported abdominal distention developing over the weekend, uniforms fitting tighter, and had gained weight.  She was trying to follow a low-sodium diet, but stated she was not perfect with this.  Some deli meats, and eating out.  No encephalopathy.  She is using lactulose as needed for occasional constipation.  She had started hepatitis a and B vaccination series with last vaccine due on 8/27.  Plan to arrange ultrasound to evaluate ascites, increase Lasix to 40 mg daily and spironolactone to 100 mg daily, continue lactulose as needed, pantoprazole twice daily, follow-up in 4 weeks.  Would need to discuss starting nonselective beta-blocker at follow-up.  Ultrasound only showed small volume ascites.  Recommended monitoring to see how diuretics helped with her symptoms.   Repeat BMP with creatinine and electrolytes within normal limits.   Today: Labs: Reviewed most recent labs on file 12/10/2022 when patient was evaluated at [facility] for DOE, precordial chest pain.  BNP was elevated at 88, lipase 94, bilirubin 3.6, alkaline phosphatase 125, AST 44, platelets 50.  MELD 3.0: 14 in May 2024. MELD 3.0 is 16 based on CMP 12/10/22.   Patient reports she is feeling well overall. She is having some constipation since starting Mounjaro a couple months ago.  She is having hard stools.  She is not taking lactulose on a regular basis.  Denies BRBPR, melena, abdominal pain, nausea, vomiting, reflux symptoms.  She does continue to bruise easily.  [place]: 09/15/2022 with cirrhosis,  splenomegaly, mild ascites.  No focal liver lesion. Hep A/B vaccination:  Due for last vaccination in August.  EGD: 09/23/2022 with grade 1 esophageal varices, portal hypertensive gastropathy.  Recommended considering nonselective beta-blocker for primary prophylaxis. Beta-blocker: Not ongoing currently. Ascites/peripheral edema: Much improve/resolved with diuretics.  Not following a strict low-sodium diet.  Reports drinking pickle juice to prevent cramps.  Also eating out fairly frequently. Diuretics: Started Lasix 20 mg and spironolactone 50 mg daily 09/15/2022, increased to Lasix 40 mg and spironolactone 100 mg daily in May 2024 due to abdominal distention and lower extremity edema. Encephalopathy:  None.    Colonoscopy 09/23/2022: Nonbleeding internal hemorrhoids, sigmoid diverticulosis, two 4-5 mm polyps in the sigmoid colon, stool in the entire colon. Recommended repeat colonoscopy in 5 years. Pathology with tubular adenoma.   Past Medical History:  Diagnosis Date   Anxiety    Cirrhosis (HCC)    Depression    GERD (gastroesophageal reflux disease)    with grade d esophagitis in March 2023.   Hypercholesteremia    Hypertension    Hypothyroidism    Trimalleolar fracture of ankle, closed, right, sequela    Type 2 diabetes mellitus (HCC)     Past Surgical History:  Procedure Laterality Date   ABDOMINAL HYSTERECTOMY     BIOPSY  09/21/2021   Procedure: BIOPSY;  Surgeon: Dolores Frame, MD;  [facility];  Service: Gastroenterology;;   CARDIAC CATHETERIZATION N/A 09/10/2015   Procedure: Left Heart Cath and Coronary Angiography;  Surgeon: Corky Crafts, MD;  Location: [facility]  INVASIVE CV LAB;  Service: Cardiovascular;  Laterality: N/A;   CHOLECYSTECTOMY     COLONOSCOPY WITH PROPOFOL N/A 09/23/2022   Procedure: COLONOSCOPY WITH PROPOFOL;  Surgeon: Lanelle Bal, DO;  [facility]: [facility];  Service: Endoscopy;  Laterality: N/A;  1:15 pn, asa 3   ESOPHAGEAL  BRUSHING  09/21/2021   Procedure: ESOPHAGEAL BRUSHING;  Surgeon: Marguerita Merles, Reuel Boom, MD;  Location: [facility];  Service: Gastroenterology;;   ESOPHAGOGASTRODUODENOSCOPY (EGD) WITH PROPOFOL N/A 09/21/2021   Surgeon: Dolores Frame, MD;  grade D esophagitis with scant bleeding, KOH negative, could not rule out small varices, but no medium to large varices, mild portal hypertensive gastropathy.  Pathology showed mild foveolar hyperplasia, negative for H. pylori.  Recommended repeat EGD in 3 months.   ESOPHAGOGASTRODUODENOSCOPY (EGD) WITH PROPOFOL N/A 09/23/2022   Surgeon: Lanelle Bal, DO; grade 1 esophageal varices, portal hypertensive gastropathy.  Recommended considering nonselective beta-blocker for primary prophylaxis.   OPEN REDUCTION INTERNAL FIXATION (ORIF) TIBIA/FIBULA FRACTURE Right 08/04/2016   Procedure: OPEN REDUCTION INTERNAL FIXATION (ORIF) RIGHT TRIMALLEOLAR FRACTURE;  Surgeon: Toni Arthurs, MD;  Location:  S[facility]ERY [facility];  Service: Orthopedics;  Laterality: Right;   POLYPECTOMY  09/23/2022   Procedure: POLYPECTOMY INTESTINAL;  Surgeon: Lanelle Bal, DO;  Location: [facility];  Service: Endoscopy;;   WRIST SURGERY Left    ganglion cyst    Current Outpatient Medications  Medication Sig Dispense Refill   amLODipine (NORVASC) 5 MG tablet Take 5 mg by mouth daily.     calcium carbonate (TUMS - DOSED IN MG ELEMENTAL CALCIUM) 500 MG chewable tablet Chew 2 tablets by mouth daily as needed for indigestion or heartburn.     Cholecalciferol 50 MCG (2000 UT) TABS Take 2,000 Units by mouth daily.     ELDERBERRY PO Take 50 mg by mouth daily.     FLUoxetine (PROZAC) 40 MG capsule Take 40 mg by mouth daily.     furosemide (LASIX) 40 MG tablet Take 1 tablet (40 mg total) by mouth daily. 30 tablet 3   lactulose (CHRONULAC) 10 GM/15ML solution Take 30 mLs (20 g total) by mouth daily as needed for mild constipation. Goal of 2-3 soft bowel movements daily.  946 mL 3   loratadine (CLARITIN) 10 MG tablet Take 10 mg by mouth daily.     metFORMIN (GLUCOPHAGE) 500 MG tablet Take 500 mg by mouth 2 (two) times daily with a meal.     Milk Thistle 1000 MG CAPS Take 1,000 mg by mouth daily.     ondansetron (ZOFRAN) 4 MG tablet Take 4 mg by mouth 2 (two) times daily as needed.     pantoprazole (PROTONIX) 40 MG tablet Take 1 tablet (40 mg total) by mouth 2 (two) times daily before a meal. 60 tablet 3   spironolactone (ALDACTONE) 100 MG tablet Take 1 tablet (100 mg total) by mouth daily. 30 tablet 3   tirzepatide (MOUNJARO) 2.5 MG/0.5ML Pen Inject 2.5 mg into the skin once a week. Once a week injection     vitamin C (ASCORBIC ACID) 500 MG tablet Take 500 mg by mouth daily.      zinc gluconate 50 MG tablet Take 50 mg by mouth daily.     albuterol (VENTOLIN HFA) 108 (90 Base) MCG/ACT inhaler Inhale 1-2 puffs into the lungs every 6 (six) hours as needed for wheezing or shortness of breath. (Patient not taking: Reported on 12/28/2022) 18 g 0   fluticasone (FLOVENT HFA) 110 MCG/ACT inhaler Inhale  2 puffs into the lungs in the morning and at bedtime for 10 days. (Patient taking differently: Inhale 2 puffs into the lungs daily as needed (Cough).) 1 each 12   meclizine (ANTIVERT) 25 MG tablet Take 25 mg by mouth 3 (three) times daily as needed for dizziness or nausea. (Patient not taking: Reported on 11/23/2022)     No current facility-administered medications for this visit.    Allergies as of 12/28/2022 - Review Complete 12/28/2022  Allergen Reaction Noted   Codeine Nausea And Vomiting 11/03/2009   Dilaudid [hydromorphone hcl]  03/28/2013   Tape Swelling and Other (See Comments) 02/21/2015   Vicodin [hydrocodone-acetaminophen] Nausea And Vomiting 03/05/2012   Latex Rash 12/06/2016    Family History  Problem Relation Age of Onset   Hypertension Mother    Cirrhosis Mother        NASH   Coronary artery disease Father    Heart disease Father    Barrett's  esophagus Father    Coronary artery disease Paternal Uncle    Coronary artery disease Cousin    Colon cancer Neg Hx     Social History   Socioeconomic History   Marital status: Married    Spouse name: Not on file   Number of children: 2   Years of education: Not on file   Highest education level: Not on file  Occupational History   Occupation: property Geologist, engineering: [employer]  Tobacco Use   Smoking status: Former    Packs/day: 0.25    Years: 10.00    Additional pack years: 0.00    Total pack years: 2.50    Types: Cigarettes    Quit date: 05/20/1991    Years since quitting: 31.6   Smokeless tobacco: Never   Tobacco comments:    Used to smoke a pack in 1-2 weeks/ 1992  Vaping Use   Vaping Use: Never used  Substance and Sexual Activity   Alcohol use: Not Currently   Drug use: No   Sexual activity: Yes    Birth control/protection: Surgical  Other Topics Concern   Not on file  Social History Narrative   Not on file   Social Determinants of Health   Financial Resource Strain: Not on file  Food Insecurity: Not on file  Transportation Needs: Not on file  Physical Activity: Not on file  Stress: Not on file  Social Connections: Not on file    Review of Systems: Gen: Denies fever, chills, cold or flulike symptoms, presyncope, syncope. CV: Denies chest pain, palpitations. Resp: Denies dyspnea, cough. GI: See HPI Heme: See HPI  Physical Exam: BP 127/77 (BP Location: Right Arm, Patient Position: Sitting, Cuff Size: Large)   Pulse 77   Temp 97.7 F (36.5 C) (Temporal)   Ht 5\' 4"  (1.626 m)   Wt 255 lb (115.7 kg)   SpO2 96%   BMI 43.77 kg/m  General:   Alert and oriented. No distress noted. Pleasant and cooperative.  Head:  Normocephalic and atraumatic. Eyes:  Conjuctiva clear without scleral icterus. Heart:  S1, S2 present without murmurs appreciated. Lungs:  Clear to auscultation bilaterally. No wheezes, rales, or rhonchi. No distress.  Abdomen:  +BS,  soft, non-tender and non-distended. No rebound or guarding. No HSM or masses noted. Msk:  Symmetrical without gross deformities. Normal posture. Extremities:  With trace bilateral LE edema.  Neurologic:  Alert and  oriented x4 Psych:  Normal mood and affect.    Assessment:  57 year old female with history  of type 2 diabetes, HTN, HLD, hypothyroidism, GERD with esophagitis, NASH cirrhosis, presenting today for follow-up of cirrhosis.  NASH cirrhosis: Cirrhosis is complicated by coagulopathy in the setting of slight INR elevation and thrombocytopenia, lower extremity edema/small volume ascites, grade 1 esophageal varices and portal hypertensive gastropathy noted on EGD March 2024 with recommendations to consider nonselective beta-blocker which has not yet been started.  Ultrasound up-to-date in March 2024 with no focal liver lesion.  MELD 3.0 is 16.  Clinically, she is doing well on Lasix 40 mg daily and spironolactone 100 mg daily though she is not following a low-sodium diet and has trace LE edema.  She does bruise easily, but no bleeding.  No encephalopathy.  Will plan to start her on nadolol 20 mg daily for primary variceal bleeding prophylaxis.  Constipation: Chronic, mild, intermittent constipation with worsening since starting Mounjaro.  She was prescribed lactulose, but is not taking this routinely.  Recommended lactulose 30 mL daily.  Can increase to twice daily if needed.  GERD: Well-controlled on pantoprazole 40 mg twice daily.  No alarm symptoms.   Plan:  Start nadolol 20 mg daily.  Requested patient to record heart rate and blood pressure daily and bring this report by the office after 1 week.  Continue Lasix 40 mg daily and spironolactone 100 mg daily. Counseled on the importance of strict 2 g sodium diet restriction per day. Start lactulose 30 mL daily. Other nutrition recommendations: High-protein diet from a primarily plant-based diet. Avoid red meat.  No raw or undercooked  meat, seafood, or shellfish. Low-fat/cholesterol/carbohydrate diet. Recommend at least 30 minutes of aerobic and resistance exercise 3 days/week. Follow-up in 3 months or sooner if needed.   Ermalinda Memos, PA-C [facility] 12/28/2022

## 2022-12-28 ENCOUNTER — Encounter: Payer: Self-pay | Admitting: Gastroenterology

## 2022-12-28 ENCOUNTER — Ambulatory Visit (INDEPENDENT_AMBULATORY_CARE_PROVIDER_SITE_OTHER): Payer: Medicaid Other | Admitting: Gastroenterology

## 2022-12-28 VITALS — BP 127/77 | HR 77 | Temp 97.7°F | Ht 64.0 in | Wt 255.0 lb

## 2022-12-28 DIAGNOSIS — I8500 Esophageal varices without bleeding: Secondary | ICD-10-CM

## 2022-12-28 DIAGNOSIS — K219 Gastro-esophageal reflux disease without esophagitis: Secondary | ICD-10-CM

## 2022-12-28 DIAGNOSIS — K7460 Unspecified cirrhosis of liver: Secondary | ICD-10-CM | POA: Diagnosis not present

## 2022-12-28 DIAGNOSIS — K5900 Constipation, unspecified: Secondary | ICD-10-CM

## 2022-12-28 MED ORDER — NADOLOL 20 MG PO TABS
20.0000 mg | ORAL_TABLET | Freq: Every day | ORAL | 3 refills | Status: DC
Start: 2022-12-28 — End: 2023-10-04

## 2022-12-28 NOTE — Patient Instructions (Addendum)
Start nadolol 20 mg daily to prevent bleeding from esophageal varices.  Monitor your heart rate and blood pressure daily and bring this report by the office. The goal is for your heart rate to be 55-60. If you notice your heart rate is getting lower than this or if you blood pressure is getting low with top number below 90, please let me know.   Start taking lactulose 30 ml daily. You can increase this to twice daily if needed. Let me know if you continue to have any trouble with constipation.   Continue Lasix 40 mg daily and Spironolactone 100 mg daily.   Nutrition:  High-protein diet from a primarily plant-based diet. Avoid red meat.  No raw or undercooked meat, seafood, or shellfish. Low-fat/cholesterol/carbohydrate diet. Limit sodium to no more than 2000 mg/day including everything that you eat and drink. Recommend at least 30 minutes of aerobic and resistance exercise 3 days/week.  We will see you back in 3 months or sooner if needed.   It was great to see you again today!  Ermalinda Memos, PA-C [facility]]

## 2022-12-29 ENCOUNTER — Encounter (INDEPENDENT_AMBULATORY_CARE_PROVIDER_SITE_OTHER): Payer: Self-pay

## 2022-12-29 DIAGNOSIS — I8500 Esophageal varices without bleeding: Secondary | ICD-10-CM | POA: Insufficient documentation

## 2022-12-30 ENCOUNTER — Encounter: Payer: Self-pay | Admitting: Gastroenterology

## 2022-12-30 ENCOUNTER — Telehealth (INDEPENDENT_AMBULATORY_CARE_PROVIDER_SITE_OTHER): Payer: Self-pay

## 2022-12-30 NOTE — Telephone Encounter (Signed)
12/29/2022 RE: Medication Service Request Approval   Member ID: [id number]  DOB: 21-Jul-1965  Case ID: [id number]  Candice Stanley [address] [place], [place] [address]  Re: Initial Service Request Determination-Approval  Dear Candice Stanley:  [facility] has reviewed the request for Nadolol Tab 20mg  submitted by Candice Stanley on behalf of Candice Stanley on 12/29/2022. After review, the request for service is: Approved through 12/29/2023.   Sincerely, Pharmacy Department

## 2022-12-30 NOTE — Telephone Encounter (Signed)
Noted  

## 2023-01-05 ENCOUNTER — Encounter: Payer: Self-pay | Admitting: *Deleted

## 2023-01-23 ENCOUNTER — Other Ambulatory Visit: Payer: Self-pay | Admitting: Gastroenterology

## 2023-01-23 DIAGNOSIS — K2100 Gastro-esophageal reflux disease with esophagitis, without bleeding: Secondary | ICD-10-CM

## 2023-02-27 ENCOUNTER — Inpatient Hospital Stay: Payer: Medicaid Other

## 2023-03-02 ENCOUNTER — Other Ambulatory Visit: Payer: Self-pay

## 2023-03-03 ENCOUNTER — Inpatient Hospital Stay: Payer: Medicaid Other | Attending: Hematology

## 2023-03-03 DIAGNOSIS — D696 Thrombocytopenia, unspecified: Secondary | ICD-10-CM | POA: Insufficient documentation

## 2023-03-03 DIAGNOSIS — D61818 Other pancytopenia: Secondary | ICD-10-CM

## 2023-03-03 DIAGNOSIS — Z87891 Personal history of nicotine dependence: Secondary | ICD-10-CM | POA: Diagnosis not present

## 2023-03-03 DIAGNOSIS — Z803 Family history of malignant neoplasm of breast: Secondary | ICD-10-CM | POA: Insufficient documentation

## 2023-03-03 DIAGNOSIS — D72819 Decreased white blood cell count, unspecified: Secondary | ICD-10-CM | POA: Diagnosis not present

## 2023-03-03 DIAGNOSIS — K7581 Nonalcoholic steatohepatitis (NASH): Secondary | ICD-10-CM | POA: Insufficient documentation

## 2023-03-03 LAB — CBC WITH DIFFERENTIAL/PLATELET
Abs Immature Granulocytes: 0.01 10*3/uL (ref 0.00–0.07)
Basophils Absolute: 0 10*3/uL (ref 0.0–0.1)
Basophils Relative: 1 %
Eosinophils Absolute: 0.1 10*3/uL (ref 0.0–0.5)
Eosinophils Relative: 2 %
HCT: 36.4 % (ref 36.0–46.0)
Hemoglobin: 12.2 g/dL (ref 12.0–15.0)
Immature Granulocytes: 0 %
Lymphocytes Relative: 38 %
Lymphs Abs: 1.4 10*3/uL (ref 0.7–4.0)
MCH: 34.3 pg — ABNORMAL HIGH (ref 26.0–34.0)
MCHC: 33.5 g/dL (ref 30.0–36.0)
MCV: 102.2 fL — ABNORMAL HIGH (ref 80.0–100.0)
Monocytes Absolute: 0.4 10*3/uL (ref 0.1–1.0)
Monocytes Relative: 9 %
Neutro Abs: 1.9 10*3/uL (ref 1.7–7.7)
Neutrophils Relative %: 50 %
Platelets: 58 10*3/uL — ABNORMAL LOW (ref 150–400)
RBC: 3.56 MIL/uL — ABNORMAL LOW (ref 3.87–5.11)
RDW: 14.6 % (ref 11.5–15.5)
WBC: 3.8 10*3/uL — ABNORMAL LOW (ref 4.0–10.5)
nRBC: 0 % (ref 0.0–0.2)

## 2023-03-03 LAB — LACTATE DEHYDROGENASE: LDH: 178 U/L (ref 98–192)

## 2023-03-06 ENCOUNTER — Inpatient Hospital Stay: Payer: Medicaid Other | Admitting: Hematology

## 2023-03-06 ENCOUNTER — Encounter: Payer: Self-pay | Admitting: Gastroenterology

## 2023-03-09 ENCOUNTER — Inpatient Hospital Stay: Payer: Medicaid Other | Admitting: Oncology

## 2023-03-09 ENCOUNTER — Inpatient Hospital Stay (HOSPITAL_BASED_OUTPATIENT_CLINIC_OR_DEPARTMENT_OTHER): Payer: Medicaid Other | Admitting: Oncology

## 2023-03-09 VITALS — BP 117/71 | HR 76 | Temp 98.7°F | Resp 16 | Wt 256.0 lb

## 2023-03-09 DIAGNOSIS — D61818 Other pancytopenia: Secondary | ICD-10-CM | POA: Diagnosis not present

## 2023-03-09 DIAGNOSIS — D696 Thrombocytopenia, unspecified: Secondary | ICD-10-CM | POA: Diagnosis not present

## 2023-03-09 NOTE — Progress Notes (Signed)
[facility] [address], [place] [address]    Clinic Day:  03/09/2023  Referring physician: Benita Stabile, MD  Patient Care Team: Benita Stabile, MD as PCP - General (Internal Medicine) [place] Bali, MD (Inactive) as Consulting Physician (Gastroenterology) Lanelle Bal, DO as Consulting Physician (Gastroenterology)   ASSESSMENT & PLAN:   Assessment: 1.  Leukopenia and thrombocytopenia: - Patient seen at the request of Dr. Scharlene Gloss office. - CBC on 03/31/2021 with white count 2.1, platelet count 48, ANC 900.  Hemoglobin was 11.7. - CBC on 12/30/2020 white count 2.5, platelet count 57, hemoglobin 12.5, ANC 1.2. - Ultrasound abdomen on 06/05/2018 with increased hepatic echogenicity, fatty infiltration and splenomegaly measuring 18.8 cm, volume 1943. - No bleeding although easy bruising.  No B symptoms or infections.  No history of transfusions or hepatitis. - Progressive thrombocytopenia and leukopenia since 2018. - BMBX on 04/21/2021 showed slightly hypercellular bone marrow with trilineage hematopoiesis.  Slight lymphocytosis of primarily small lymphoid cells with lack of significant aggregates.  Chromosome analysis is normal.  2.  Social/family history: - Lives at home with her husband.  She works as a Investment banker, corporate.  Denies any chemical exposure.  She is a non-smoker. - Her mother hadsNash cirrhosis.  Maternal grandmother had breast cancer.  Paternal aunt had breast cancer.   Plan: 1.  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 - She had pneumonia with sepsis in March 2023 requiring admission.  She did not have any further infections after that. - Denies any nosebleeds, bleeding per rectum or melena. - She is currently working in home health. - Reviewed labs from 03/03/2023 show WBC of 3.8 with ANC of 1.9.  Platelet count 58,000.  Hemoglobin 12.2. - Will consider splenic embolization if  platelet count continues to drop. - RTC 6 months with repeat CBC.  PLAN SUMMARY: >> Continue to monitor lab work (CBC with differential, LDH) every 6 months and follow-up. >> Platelet count is stable.      No orders of the defined types were placed in this encounter.  I spent 25 minutes dedicated to the care of this patient (face-to-face and non-face-to-face) on the date of the encounter to include what is described in the assessment and plan.    Mauro Kaufmann, NP   8/29/20243:10 PM  CHIEF COMPLAINT:   Diagnosis:  leukopenia and thrombocytopenia    Cancer Staging  No matching staging information was found for the patient.    Prior Therapy: None  Current Therapy:   surveillance   HISTORY OF PRESENT ILLNESS:   Oncology History   No history exists.     INTERVAL HISTORY:   Candice Stanley is a 57 y.o. female presenting to clinic today for follow up of leukopenia and thrombocytopenia. She was last seen by Dr. Ellin Saba on 08/29/22.  She had colonoscopy/EGD on 09/23/2022.Nonbleeding internal hemorrhoids, sigmoid diverticulosis, two 4-5 mm polyps in the sigmoid colon, stool in the entire colon. Recommended repeat colonoscopy in 5 years. Pathology with tubular adenoma.   Today, she states that she is doing well overall. Her appetite level is at 100%. Her energy level is at 50%. She denies any recent bleeding. She denies any blood in stool and black stool.   Has insomnia. Has lost 30 pounds on monjaro. Taking hydroxizine for sleep. Has tried melatonin but was not effective.   She currently takes Vitamin C(zinc), Multivitamin, D3, and elderberry.   She currently works in  home health care.   PAST MEDICAL HISTORY:   Past Medical History: Past Medical History:  Diagnosis Date   Anxiety    Cirrhosis (HCC)    Depression    GERD (gastroesophageal reflux disease)    with grade d esophagitis in March 2023.   Hypercholesteremia    Hypertension    Hypothyroidism    Trimalleolar  fracture of ankle, closed, right, sequela    Type 2 diabetes mellitus (HCC)     Surgical History: Past Surgical History:  Procedure Laterality Date   ABDOMINAL HYSTERECTOMY     BIOPSY  09/21/2021   Procedure: BIOPSY;  Surgeon: Dolores Frame, MD;  Location: [facility];  Service: Gastroenterology;;   CARDIAC CATHETERIZATION N/A 09/10/2015   Procedure: Left Heart Cath and Coronary Angiography;  Surgeon: Corky Crafts, MD;  Location: [facility] INVASIVE CV LAB;  Service: Cardiovascular;  Laterality: N/A;   CHOLECYSTECTOMY     COLONOSCOPY WITH PROPOFOL N/A 09/23/2022   Procedure: COLONOSCOPY WITH PROPOFOL;  Surgeon: Lanelle Bal, DO;  Location: [facility];  Service: Endoscopy;  Laterality: N/A;  1:15 pn, asa 3   ESOPHAGEAL BRUSHING  09/21/2021   Procedure: ESOPHAGEAL BRUSHING;  Surgeon: Marguerita Merles, Reuel Boom, MD;  Location: [facility];  Service: Gastroenterology;;   ESOPHAGOGASTRODUODENOSCOPY (EGD) WITH PROPOFOL N/A 09/21/2021   Surgeon: Dolores Frame, MD;  grade D esophagitis with scant bleeding, KOH negative, could not rule out small varices, but no medium to large varices, mild portal hypertensive gastropathy.  Pathology showed mild foveolar hyperplasia, negative for H. pylori.  Recommended repeat EGD in 3 months.   ESOPHAGOGASTRODUODENOSCOPY (EGD) WITH PROPOFOL N/A 09/23/2022   Surgeon: Lanelle Bal, DO; grade 1 esophageal varices, portal hypertensive gastropathy.  Recommended considering nonselective beta-blocker for primary prophylaxis.   OPEN REDUCTION INTERNAL FIXATION (ORIF) TIBIA/FIBULA FRACTURE Right 08/04/2016   Procedure: OPEN REDUCTION INTERNAL FIXATION (ORIF) RIGHT TRIMALLEOLAR FRACTURE;  Surgeon: Toni Arthurs, MD;  Location: [facility];  Service: Orthopedics;  Laterality: Right;   POLYPECTOMY  09/23/2022   Procedure: POLYPECTOMY INTESTINAL;  Surgeon: Lanelle Bal, DO;  Location: [facility];  Service: Endoscopy;;    WRIST SURGERY Left    ganglion cyst    Social History: Social History   Socioeconomic History   Marital status: Married    Spouse name: Not on file   Number of children: 2   Years of education: Not on file   Highest education level: Not on file  Occupational History   Occupation: property mgmt    Employer: [employer]  Tobacco Use   Smoking status: Former    Current packs/day: 0.00    Average packs/day: 0.3 packs/day for 10.0 years (2.5 ttl pk-yrs)    Types: Cigarettes    Start date: 05/19/1981    Quit date: 05/20/1991    Years since quitting: 31.8   Smokeless tobacco: Never   Tobacco comments:    Used to smoke a pack in 1-2 weeks/ 1992  Vaping Use   Vaping status: Never Used  Substance and Sexual Activity   Alcohol use: Not Currently   Drug use: No   Sexual activity: Yes    Birth control/protection: Surgical  Other Topics Concern   Not on file  Social History Narrative   Not on file   Social Determinants of Health   Financial Resource Strain: Not on file  Food Insecurity: Not on file  Transportation Needs: Not on file  Physical Activity: Not on file  Stress: Not  on file  Social Connections: Not on file  Intimate Partner Violence: Not on file    Family History: Family History  Problem Relation Age of Onset   Hypertension Mother    Cirrhosis Mother        NASH   Coronary artery disease Father    Heart disease Father    Barrett's esophagus Father    Coronary artery disease Paternal Uncle    Coronary artery disease Cousin    Colon cancer Neg Hx     Current Medications:  Current Outpatient Medications:    albuterol (VENTOLIN HFA) 108 (90 Base) MCG/ACT inhaler, Inhale 1-2 puffs into the lungs every 6 (six) hours as needed for wheezing or shortness of breath. (Patient not taking: Reported on 12/28/2022), Disp: 18 g, Rfl: 0   amLODipine (NORVASC) 5 MG tablet, Take 5 mg by mouth daily., Disp: , Rfl:    calcium carbonate (TUMS - DOSED IN MG ELEMENTAL  CALCIUM) 500 MG chewable tablet, Chew 2 tablets by mouth daily as needed for indigestion or heartburn., Disp: , Rfl:    Cholecalciferol 50 MCG (2000 UT) TABS, Take 2,000 Units by mouth daily., Disp: , Rfl:    ELDERBERRY PO, Take 50 mg by mouth daily., Disp: , Rfl:    FLUoxetine (PROZAC) 40 MG capsule, Take 40 mg by mouth daily., Disp: , Rfl:    fluticasone (FLOVENT HFA) 110 MCG/ACT inhaler, Inhale 2 puffs into the lungs in the morning and at bedtime for 10 days. (Patient taking differently: Inhale 2 puffs into the lungs daily as needed (Cough).), Disp: 1 each, Rfl: 12   furosemide (LASIX) 40 MG tablet, Take 1 tablet (40 mg total) by mouth daily., Disp: 30 tablet, Rfl: 3   lactulose (CHRONULAC) 10 GM/15ML solution, Take 30 mLs (20 g total) by mouth daily as needed for mild constipation. Goal of 2-3 soft bowel movements daily., Disp: 946 mL, Rfl: 3   loratadine (CLARITIN) 10 MG tablet, Take 10 mg by mouth daily., Disp: , Rfl:    meclizine (ANTIVERT) 25 MG tablet, Take 25 mg by mouth 3 (three) times daily as needed for dizziness or nausea. (Patient not taking: Reported on 11/23/2022), Disp: , Rfl:    metFORMIN (GLUCOPHAGE) 500 MG tablet, Take 500 mg by mouth 2 (two) times daily with a meal., Disp: , Rfl:    Milk Thistle 1000 MG CAPS, Take 1,000 mg by mouth daily., Disp: , Rfl:    nadolol (CORGARD) 20 MG tablet, Take 1 tablet (20 mg total) by mouth daily., Disp: 30 tablet, Rfl: 3   ondansetron (ZOFRAN) 4 MG tablet, Take 4 mg by mouth 2 (two) times daily as needed., Disp: , Rfl:    pantoprazole (PROTONIX) 40 MG tablet, TAKE 1 TABLET BY MOUTH TWICE DAILY BEFORE A MEAL, Disp: 60 tablet, Rfl: 5   spironolactone (ALDACTONE) 100 MG tablet, Take 1 tablet (100 mg total) by mouth daily., Disp: 30 tablet, Rfl: 3   tirzepatide (MOUNJARO) 2.5 MG/0.5ML Pen, Inject 2.5 mg into the skin once a week. Once a week injection, Disp: , Rfl:    vitamin C (ASCORBIC ACID) 500 MG tablet, Take 500 mg by mouth daily. , Disp: , Rfl:     zinc gluconate 50 MG tablet, Take 50 mg by mouth daily., Disp: , Rfl:    Allergies: Allergies  Allergen Reactions   Codeine Nausea And Vomiting   Dilaudid [Hydromorphone Hcl]     Drop in blood pressure   Tape Swelling and Other (See Comments)  Rash(paper tape)   Vicodin [Hydrocodone-Acetaminophen] Nausea And Vomiting   Latex Rash    REVIEW OF SYSTEMS:   Review of Systems  Constitutional:  Positive for unexpected weight change.  Psychiatric/Behavioral:  Positive for sleep disturbance.      VITALS:   There were no vitals taken for this visit.  Wt Readings from Last 3 Encounters:  12/28/22 255 lb (115.7 kg)  11/23/22 285 lb (129.3 kg)  09/21/22 (P) 285 lb 6.4 oz (129.5 kg)    There is no height or weight on file to calculate BMI.  Performance status (ECOG): 0 - Asymptomatic  PHYSICAL EXAM:   Physical Exam Vitals and nursing note reviewed. Exam conducted with a chaperone present.  Constitutional:      Appearance: Normal appearance.  Cardiovascular:     Rate and Rhythm: Normal rate and regular rhythm.     Pulses: Normal pulses.     Heart sounds: Normal heart sounds.  Pulmonary:     Effort: Pulmonary effort is normal.     Breath sounds: Normal breath sounds.  Abdominal:     Palpations: Abdomen is soft. There is no hepatomegaly, splenomegaly or mass.     Tenderness: There is no abdominal tenderness.  Musculoskeletal:     Right lower leg: No edema.     Left lower leg: No edema.  Lymphadenopathy:     Cervical: No cervical adenopathy.     Right cervical: No superficial, deep or posterior cervical adenopathy.    Left cervical: No superficial, deep or posterior cervical adenopathy.     Upper Body:     Right upper body: No supraclavicular or axillary adenopathy.     Left upper body: No supraclavicular or axillary adenopathy.  Neurological:     General: No focal deficit present.     Mental Status: She is alert and oriented to person, place, and time.   Psychiatric:        Mood and Affect: Mood normal.        Behavior: Behavior normal.     LABS:      Latest Ref Rng & Units 03/03/2023    9:48 AM 08/22/2022    2:37 PM 10/31/2021    4:20 PM  CBC  WBC 4.0 - 10.5 K/uL 3.8  3.0  2.1   Hemoglobin 12.0 - 15.0 g/dL 24.4  01.0  27.2   Hematocrit 36.0 - 46.0 % 36.4  36.1  32.6   Platelets 150 - 400 K/uL 58  53  47       Latest Ref Rng & Units 12/06/2022    1:54 PM 09/30/2022    9:30 AM 09/05/2022    3:51 PM  CMP  Glucose 65 - 139 mg/dL 536  644  034   BUN 7 - 25 mg/dL 11  7  8    Creatinine 0.50 - 1.03 mg/dL 7.42  5.95  6.38   Sodium 135 - 146 mmol/L 138  140  143   Potassium 3.5 - 5.3 mmol/L 4.4  4.0  3.9   Chloride 98 - 110 mmol/L 105  108  110   CO2 20 - 32 mmol/L 23  25  30    Calcium 8.6 - 10.4 mg/dL 9.8  9.1  9.1   Total Protein 6.1 - 8.1 g/dL   5.3   Total Bilirubin 0.2 - 1.2 mg/dL   1.7   AST 10 - 35 U/L   30   ALT 6 - 29 U/L   18  No results found for: "CEA1", "CEA" / No results found for: "CEA1", "CEA" No results found for: "PSA1" No results found for: "CAN199" No results found for: "CAN125"  Lab Results  Component Value Date   TOTALPROTELP 5.9 (L) 04/09/2021   ALBUMINELP 3.1 04/09/2021   A1GS 0.2 04/09/2021   A2GS 0.4 04/09/2021   BETS 1.0 04/09/2021   GAMS 1.2 04/09/2021   MSPIKE Not Observed 04/09/2021   SPEI Comment 04/09/2021   Lab Results  Component Value Date   TIBC 318 09/18/2021   FERRITIN 23 09/18/2021   IRONPCTSAT 15 09/18/2021   Lab Results  Component Value Date   LDH 178 03/03/2023   LDH 163 04/09/2021     STUDIES:   No results found.

## 2023-04-01 ENCOUNTER — Encounter (HOSPITAL_COMMUNITY): Payer: Self-pay

## 2023-04-01 ENCOUNTER — Emergency Department (HOSPITAL_COMMUNITY): Payer: Medicaid Other

## 2023-04-01 ENCOUNTER — Emergency Department (HOSPITAL_COMMUNITY)
Admission: EM | Admit: 2023-04-01 | Discharge: 2023-04-01 | Disposition: A | Discharge status: Home / Self Care | Payer: Medicaid Other | Attending: Emergency Medicine | Admitting: Emergency Medicine

## 2023-04-01 ENCOUNTER — Other Ambulatory Visit: Payer: Self-pay

## 2023-04-01 DIAGNOSIS — Z7984 Long term (current) use of oral hypoglycemic drugs: Secondary | ICD-10-CM | POA: Insufficient documentation

## 2023-04-01 DIAGNOSIS — E039 Hypothyroidism, unspecified: Secondary | ICD-10-CM | POA: Diagnosis not present

## 2023-04-01 DIAGNOSIS — I10 Essential (primary) hypertension: Secondary | ICD-10-CM | POA: Diagnosis not present

## 2023-04-01 DIAGNOSIS — R0789 Other chest pain: Secondary | ICD-10-CM | POA: Insufficient documentation

## 2023-04-01 DIAGNOSIS — Z87891 Personal history of nicotine dependence: Secondary | ICD-10-CM | POA: Diagnosis not present

## 2023-04-01 DIAGNOSIS — Z91040 Latex allergy status: Secondary | ICD-10-CM | POA: Diagnosis not present

## 2023-04-01 DIAGNOSIS — Z79899 Other long term (current) drug therapy: Secondary | ICD-10-CM | POA: Insufficient documentation

## 2023-04-01 DIAGNOSIS — D696 Thrombocytopenia, unspecified: Secondary | ICD-10-CM | POA: Insufficient documentation

## 2023-04-01 DIAGNOSIS — E119 Type 2 diabetes mellitus without complications: Secondary | ICD-10-CM | POA: Diagnosis not present

## 2023-04-01 LAB — BASIC METABOLIC PANEL
Anion gap: 9 (ref 5–15)
BUN: 7 mg/dL (ref 6–20)
CO2: 23 mmol/L (ref 22–32)
Calcium: 9 mg/dL (ref 8.9–10.3)
Chloride: 105 mmol/L (ref 98–111)
Creatinine, Ser: 0.64 mg/dL (ref 0.44–1.00)
GFR, Estimated: >60 mL/min (ref >60)
Glucose, Bld: 151 mg/dL — ABNORMAL HIGH (ref 70–99)
Potassium: 4.1 mmol/L (ref 3.5–5.1)
Sodium: 137 mmol/L (ref 135–145)

## 2023-04-01 LAB — CBC
HCT: 36.7 % (ref 36.0–46.0)
Hemoglobin: 12.1 g/dL (ref 12.0–15.0)
MCH: 34.1 pg — ABNORMAL HIGH (ref 26.0–34.0)
MCHC: 33 g/dL (ref 30.0–36.0)
MCV: 103.4 fL — ABNORMAL HIGH (ref 80.0–100.0)
Platelets: 52 10*3/uL — ABNORMAL LOW (ref 150–400)
RBC: 3.55 MIL/uL — ABNORMAL LOW (ref 3.87–5.11)
RDW: 14.3 % (ref 11.5–15.5)
WBC: 2.7 10*3/uL — ABNORMAL LOW (ref 4.0–10.5)
nRBC: 0 % (ref 0.0–0.2)

## 2023-04-01 LAB — TROPONIN I (HIGH SENSITIVITY)
Troponin I (High Sensitivity): 3 ng/L (ref <18)
Troponin I (High Sensitivity): 3 ng/L (ref <18)

## 2023-04-01 NOTE — Discharge Instructions (Addendum)
Follow back up with hematology oncology her platelets are low again today but not in the danger zone.  They are close to where they have been.  Workup for the chest pain without any acute findings at all.  Referral given to cardiology call and arrange a follow-up.  You can also follow back up with your primary care doctor.  It is possible as this could be a muscle strain for that would recommend extra strength Tylenol.  Take 2 tablets every 8 hours.  The follow-up with cardiology is for the atypical chest pain.

## 2023-04-01 NOTE — ED Provider Notes (Addendum)
[facility] AT [facility] Provider Note   CSN: [id number] Arrival date & time: 04/01/23  [id number]     History  Chief Complaint  Patient presents with   Chest Pain    And left arm, radiating around shoulder area    Candice Stanley is a 57 y.o. female.  Patient with onset mild left anterior chest discomfort with radiation into the left arm that she noticed at 3:30 in the morning.  Patient has had heart catheterization in the past but it was in 2017.  No known heart disease but strong family history for heart disease at a young age.  Patient denies any shortness of breath or any nausea or vomiting.  No discomfort in the left anterior chest at this point in time but still has an ache in the left upper arm.  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  But it did not bother her until about 3:30 in the morning.  Past medical history significant for gastroesophageal reflux disease high cholesterol hypothyroidism type 2 diabetes hypertension history of cirrhosis past surgical history significant for abdominal hysterectomy cholecystectomy cardiac catheterization 2017 upper endoscopies patient is a former smoker quit in 1992.       Home Medications Prior to Admission medications   Medication Sig Start Date End Date Taking? Authorizing Provider  albuterol (VENTOLIN HFA) 108 (90 Base) MCG/ACT inhaler Inhale 1-2 puffs into the lungs every 6 (six) hours as needed for wheezing or shortness of breath. 06/28/21   Wallis Bamberg, PA-C  amLODipine (NORVASC) 5 MG tablet Take 5 mg by mouth daily.    [provider]  calcium carbonate (TUMS - DOSED IN MG ELEMENTAL CALCIUM) 500 MG chewable tablet Chew 2 tablets by mouth daily as needed for indigestion or heartburn. 11/01/21   [provider]  Cholecalciferol 50 MCG (2000 UT) TABS Take 2,000 Units by mouth daily.    [provider]  ELDERBERRY PO Take  50 mg by mouth daily.    [provider]  FLUoxetine (PROZAC) 40 MG capsule Take 40 mg by mouth daily. 08/15/19   [provider]  fluticasone (FLOVENT HFA) 110 MCG/ACT inhaler Inhale 2 puffs into the lungs in the morning and at bedtime for 10 days. Patient taking differently: Inhale 2 puffs into the lungs daily as needed (Cough). 07/08/21 11/23/22  Eber Hong, MD  furosemide (LASIX) 40 MG tablet Take 1 tablet (40 mg total) by mouth daily. 11/23/22   Letta Median, PA-C  lactulose (CHRONULAC) 10 GM/15ML solution Take 30 mLs (20 g total) by mouth daily as needed for mild constipation. Goal of 2-3 soft bowel movements daily. 12/08/22   Letta Median, PA-C  loratadine (CLARITIN) 10 MG tablet Take 10 mg by mouth daily.    [provider]  meclizine (ANTIVERT) 25 MG tablet Take 25 mg by mouth 3 (three) times daily as needed for dizziness or nausea. 08/30/22   [provider]  metFORMIN (GLUCOPHAGE) 500 MG tablet Take 500 mg by mouth 2 (two) times daily with a meal.    [provider]  Milk Thistle 1000 MG CAPS Take 1,000 mg by mouth daily.    [provider]  MOUNJARO 10 MG/0.5ML Pen Inject into the skin. 02/28/23   [provider]  nadolol (CORGARD) 20 MG tablet Take 1 tablet (20 mg total) by mouth daily. 12/28/22   Letta Median, PA-C  ondansetron (  ZOFRAN) 4 MG tablet Take 4 mg by mouth 2 (two) times daily as needed. Patient not taking: Reported on 03/09/2023 11/02/22   [provider]  pantoprazole (PROTONIX) 40 MG tablet TAKE 1 TABLET BY MOUTH TWICE DAILY BEFORE A MEAL 01/23/23   Letta Median, PA-C  spironolactone (ALDACTONE) 100 MG tablet Take 1 tablet (100 mg total) by mouth daily. 11/23/22   Letta Median, PA-C  vitamin C (ASCORBIC ACID) 500 MG tablet Take 500 mg by mouth daily.     [provider]  zinc gluconate 50 MG tablet Take 50 mg by mouth daily.    [provider]      Allergies     Codeine, Dilaudid [hydromorphone hcl], Tape, Vicodin [hydrocodone-acetaminophen], and Latex    Review of Systems   Review of Systems  Constitutional:  Negative for chills and fever.  HENT:  Negative for ear pain and sore throat.   Eyes:  Negative for pain and visual disturbance.  Respiratory:  Negative for cough and shortness of breath.   Cardiovascular:  Positive for chest pain. Negative for palpitations.  Gastrointestinal:  Negative for abdominal pain and vomiting.  Genitourinary:  Negative for dysuria and hematuria.  Musculoskeletal:  Negative for arthralgias and back pain.  Skin:  Negative for color change and rash.  Neurological:  Negative for seizures and syncope.  All other systems reviewed and are negative.   Physical Exam Updated Vital Signs BP 123/75   Pulse 80   Temp 98.1 F (36.7 C) (Oral)   Resp 16   Ht 1.626 m (5\' 4" )   Wt 115.7 kg   SpO2 94%   BMI 43.77 kg/m  Physical Exam Vitals and nursing note reviewed.  Constitutional:      General: She is not in acute distress.    Appearance: Normal appearance. She is well-developed.  HENT:     Head: Normocephalic and atraumatic.  Eyes:     Conjunctiva/sclera: Conjunctivae normal.  Cardiovascular:     Rate and Rhythm: Normal rate and regular rhythm.     Heart sounds: No murmur heard. Pulmonary:     Effort: Pulmonary effort is normal. No respiratory distress.     Breath sounds: Normal breath sounds. No wheezing, rhonchi or rales.  Abdominal:     Palpations: Abdomen is soft.     Tenderness: There is no abdominal tenderness.  Musculoskeletal:        General: No swelling.     Cervical back: Neck supple.  Skin:    General: Skin is warm and dry.     Capillary Refill: Capillary refill takes less than 2 seconds.  Neurological:     General: No focal deficit present.     Mental Status: She is alert and oriented to person, place, and time.  Psychiatric:        Mood and Affect: Mood normal.     ED Results /  Procedures / Treatments   Labs (all labs ordered are listed, but only abnormal results are displayed) Labs Reviewed  BASIC METABOLIC PANEL - Abnormal; Notable for the following components:      Result Value   Glucose, Bld 151 (*)    All other components within normal limits  CBC - Abnormal; Notable for the following components:   WBC 2.7 (*)    RBC 3.55 (*)    MCV 103.4 (*)    MCH 34.1 (*)    Platelets 52 (*)    All other components within normal limits  TROPONIN I (HIGH SENSITIVITY)  TROPONIN I (HIGH SENSITIVITY)    EKG EKG Interpretation Date/Time:  Saturday April 01 2023 07:42:28 EDT Ventricular Rate:  82 PR Interval:  160 QRS Duration:  93 QT Interval:  398 QTC Calculation: 465 R Axis:   22  Text Interpretation: Sinus rhythm Low voltage, precordial leads Borderline T abnormalities, anterior leads Artifact Confirmed by Vanetta Mulders [phone]) on 04/01/2023 8:02:31 AM  Radiology DG Chest 2 View  Result Date: 04/01/2023 CLINICAL DATA:  57 year old female with history of chest pain. EXAM: CHEST - 2 VIEW COMPARISON:  Chest x-ray 10/31/2021. FINDINGS: Lung volumes are normal. No consolidative airspace disease. Mild diffuse interstitial prominence and peribronchial cuffing. No pleural effusions. No pneumothorax. No pulmonary nodule or mass noted. Pulmonary vasculature and the cardiomediastinal silhouette are within normal limits. IMPRESSION: 1. Diffuse interstitial prominence and peribronchial cuffing, concerning for probable bronchitis. Electronically Signed   By: Trudie Reed M.D.   On: 04/01/2023 08:46    Procedures Procedures    Medications Ordered in ED Medications - No data to display  ED Course/ Medical Decision Making/ A&P                                 Medical Decision Making Amount and/or Complexity of Data Reviewed Labs: ordered. Radiology: ordered.   Patient's chest pain symptoms could be noncardiac in nature.  But she has a lot of cardiac risk  factors.  Will get troponins x 2 chest x-ray basic labs.  EKG with a lot of artifact but does not have any significant changes from previous EKG.   Troponins x 2 very normal.  Basic metabolic panel normal renal function normal.  CBC white count down a little bit 2.7 platelets 52 but patient is followed by hematology oncology for this.  Platelets not in a dangerous.  Chest x-ray some may be consistent with bronchitis but no other acute findings.  Patient stable for discharge home and follow-up with cardiology.  And follow back up with hematology oncology.  Final Clinical Impression(s) / ED Diagnoses Final diagnoses:  Atypical chest pain  Thrombocytopenia [facility])    Rx / DC Orders ED Discharge Orders     None         Vanetta Mulders, MD 04/01/23 6045    Vanetta Mulders, MD 04/01/23 1229

## 2023-04-01 NOTE — ED Triage Notes (Signed)
Pt woke up around 0230-0300 with chest pain and pain in left arm/shoulder. States she has had it before but not in the arm. 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 Pt has hx of HTN and diabetes,no blood thinners

## 2023-04-01 NOTE — ED Notes (Signed)
Pt states she had lots of chinese food last night, and she doesn't do that often. She also states she is not supposed to be eating lots of different foods.

## 2023-04-11 ENCOUNTER — Encounter: Payer: Self-pay | Admitting: Internal Medicine

## 2023-04-23 ENCOUNTER — Ambulatory Visit
Admission: EM | Admit: 2023-04-23 | Discharge: 2023-04-23 | Disposition: A | Discharge status: Home / Self Care | Payer: Medicaid Other | Attending: Family Medicine | Admitting: Family Medicine

## 2023-04-23 ENCOUNTER — Ambulatory Visit: Payer: Medicaid Other

## 2023-04-23 DIAGNOSIS — S8011XA Contusion of right lower leg, initial encounter: Secondary | ICD-10-CM

## 2023-04-23 DIAGNOSIS — L03115 Cellulitis of right lower limb: Secondary | ICD-10-CM | POA: Diagnosis not present

## 2023-04-23 DIAGNOSIS — W19XXXA Unspecified fall, initial encounter: Secondary | ICD-10-CM

## 2023-04-23 MED ORDER — CEPHALEXIN 500 MG PO CAPS: 500.0000 mg | ORAL_CAPSULE | Freq: Two times a day (BID) | ORAL | 0 refills | Status: DC

## 2023-04-23 NOTE — ED Provider Notes (Signed)
[facility]    CSN: [id number] Arrival date & time: 04/23/23  1352      History   Chief Complaint No chief complaint on file.   HPI Candice Stanley is a 57 y.o. female.   Pt reports she fells on a pool spring x 1 week. She injured her right knee/thigh . Hurts to stand    Past Medical History:  Diagnosis Date   Anxiety    Cirrhosis (HCC)    Depression    GERD (gastroesophageal reflux disease)    with grade d esophagitis in March 2023.   Hypercholesteremia    Hypertension    Hypothyroidism    Trimalleolar fracture of ankle, closed, right, sequela    Type 2 diabetes mellitus (HCC)     Patient Active Problem List   Diagnosis Date Noted   Esophageal varices (HCC) 12/29/2022   Pain of upper abdomen 11/23/2022   Constipation 11/23/2022   Abdominal bloating 11/23/2022   Incarcerated umbilical hernia    Gastroesophageal reflux disease with esophagitis without hemorrhage 10/21/2021   Colon cancer screening 10/21/2021   Pancytopenia (HCC) 09/24/2021   Acute esophagitis    Acute pulmonary edema (HCC) 09/21/2021   Acute respiratory failure with hypoxia (HCC) 09/18/2021   Essential hypertension 09/18/2021   Obesity, Class III, BMI 40-49.9 (morbid obesity) (HCC) 09/18/2021   Thrombocytopenia (HCC) 09/18/2021   Lobar pneumonia (HCC) 09/18/2021   Cirrhosis of liver without ascites (HCC) 09/18/2021   Severe sepsis (HCC) 09/18/2021   Hypomagnesemia 09/18/2021   Hypothyroidism 06/05/2018   Uncontrolled type 2 diabetes mellitus with hyperglycemia, without long-term current use of insulin (HCC) 06/05/2018   Hypophosphatemia 06/05/2018   Depression with anxiety 06/05/2018   Chronic leukopenia 06/05/2018   Hypokalemia 06/05/2018   Chronic cough 12/06/2016   Precordial pain    Family history of early CAD    Atypical chest pain 09/02/2015   Dysphagia 04/16/2012   Odynophagia 04/16/2012   Unspecified hypothyroidism 11/03/2009   Hyperlipidemia 11/03/2009    DYSTHYMIC DISORDER 11/03/2009   GERD (gastroesophageal reflux disease) 11/03/2009   Diaphragmatic hernia 11/03/2009   CHEST PAIN UNSPECIFIED 11/03/2009    Past Surgical History:  Procedure Laterality Date   ABDOMINAL HYSTERECTOMY     BIOPSY  09/21/2021   Procedure: BIOPSY;  Surgeon: Dolores Frame, MD;  [facility];  Service: Gastroenterology;;   CARDIAC CATHETERIZATION N/A 09/10/2015   Procedure: Left Heart Cath and Coronary Angiography;  Surgeon: Corky Crafts, MD;  Location: [facility] INVASIVE CV LAB;  Service: Cardiovascular;  Laterality: N/A;   CHOLECYSTECTOMY     COLONOSCOPY WITH PROPOFOL N/A 09/23/2022   Procedure: COLONOSCOPY WITH PROPOFOL;  Surgeon: Lanelle Bal, DO;  [facility];  Service: Endoscopy;  Laterality: N/A;  1:15 pn, asa 3   ESOPHAGEAL BRUSHING  09/21/2021   Procedure: ESOPHAGEAL BRUSHING;  Surgeon: Marguerita Merles, Reuel Boom, MD;  [facility];  Service: Gastroenterology;;   ESOPHAGOGASTRODUODENOSCOPY (EGD) WITH PROPOFOL N/A 09/21/2021   Surgeon: Dolores Frame, MD;  grade D esophagitis with scant bleeding, KOH negative, could not rule out small varices, but no medium to large varices, mild portal hypertensive gastropathy.  Pathology showed mild foveolar hyperplasia, negative for H. pylori.  Recommended repeat EGD in 3 months.   ESOPHAGOGASTRODUODENOSCOPY (EGD) WITH PROPOFOL N/A 09/23/2022   Surgeon: Lanelle Bal, DO; grade 1 esophageal varices, portal hypertensive gastropathy.  Recommended considering nonselective beta-blocker for primary prophylaxis.   OPEN REDUCTION INTERNAL FIXATION (ORIF) TIBIA/FIBULA FRACTURE Right 08/04/2016  Procedure: OPEN REDUCTION INTERNAL FIXATION (ORIF) RIGHT TRIMALLEOLAR FRACTURE;  Surgeon: Toni Arthurs, MD;  Location: [facility];  Service: Orthopedics;  Laterality: Right;   POLYPECTOMY  09/23/2022   Procedure: POLYPECTOMY INTESTINAL;  Surgeon: Lanelle Bal, DO;  [facility];  Service: Endoscopy;;   WRIST SURGERY Left    ganglion cyst    OB History     Gravida  1   Para  1   Term  1   Preterm      AB      Living  1      SAB      IAB      Ectopic      Multiple      Live Births               Home Medications    Prior to Admission medications   Medication Sig Start Date End Date Taking? Authorizing Provider  cephALEXin (KEFLEX) 500 MG capsule Take 1 capsule (500 mg total) by mouth 2 (two) times daily. 04/23/23  Yes Particia Nearing, PA-C  albuterol (VENTOLIN HFA) 108 (90 Base) MCG/ACT inhaler Inhale 1-2 puffs into the lungs every 6 (six) hours as needed for wheezing or shortness of breath. 06/28/21   Wallis Bamberg, PA-C  amLODipine (NORVASC) 5 MG tablet Take 5 mg by mouth daily.    [provider]  calcium carbonate (TUMS - DOSED IN MG ELEMENTAL CALCIUM) 500 MG chewable tablet Chew 2 tablets by mouth daily as needed for indigestion or heartburn. 11/01/21   [provider]  Cholecalciferol 50 MCG (2000 UT) TABS Take 2,000 Units by mouth daily.    [provider]  ELDERBERRY PO Take 50 mg by mouth daily.    [provider]  FLUoxetine (PROZAC) 40 MG capsule Take 40 mg by mouth daily. 08/15/19   [provider]  fluticasone (FLOVENT HFA) 110 MCG/ACT inhaler Inhale 2 puffs into the lungs in the morning and at bedtime for 10 days. Patient taking differently: Inhale 2 puffs into the lungs daily as needed (Cough). 07/08/21 11/23/22  Eber Hong, MD  furosemide (LASIX) 40 MG tablet Take 1 tablet (40 mg total) by mouth daily. 11/23/22   Letta Median, PA-C  lactulose (CHRONULAC) 10 GM/15ML solution Take 30 mLs (20 g total) by mouth daily as needed for mild constipation. Goal of 2-3 soft bowel movements daily. 12/08/22   Letta Median, PA-C  loratadine (CLARITIN) 10 MG tablet Take 10 mg by mouth daily.    [provider]  meclizine (ANTIVERT) 25 MG tablet  Take 25 mg by mouth 3 (three) times daily as needed for dizziness or nausea. 08/30/22   [provider]  metFORMIN (GLUCOPHAGE) 500 MG tablet Take 500 mg by mouth 2 (two) times daily with a meal.    [provider]  Milk Thistle 1000 MG CAPS Take 1,000 mg by mouth daily.    [provider]  MOUNJARO 10 MG/0.5ML Pen Inject into the skin. 02/28/23   [provider]  nadolol (CORGARD) 20 MG tablet Take 1 tablet (20 mg total) by mouth daily. 12/28/22   Letta Median, PA-C  ondansetron (ZOFRAN) 4 MG tablet Take 4 mg by mouth 2 (two) times daily as needed. Patient not taking: Reported on 03/09/2023 11/02/22   [provider]  pantoprazole (PROTONIX) 40 MG tablet TAKE 1 TABLET BY MOUTH TWICE DAILY BEFORE A MEAL 01/23/23   Letta Median, PA-C  spironolactone (ALDACTONE) 100 MG tablet Take 1 tablet (100 mg total) by mouth daily. 11/23/22   Letta Median, PA-C  vitamin C (ASCORBIC ACID) 500 MG tablet Take 500 mg by mouth daily.     [provider]  zinc gluconate 50 MG tablet Take 50 mg by mouth daily.    [provider]    Family History Family History  Problem Relation Age of Onset   Hypertension Mother    Cirrhosis Mother        NASH   Coronary artery disease Father    Heart disease Father    Barrett's esophagus Father    Coronary artery disease Paternal Uncle    Coronary artery disease Cousin    Colon cancer Neg Hx     Social History Social History   Tobacco Use   Smoking status: Former    Current packs/day: 0.00    Average packs/day: 0.3 packs/day for 10.0 years (2.5 ttl pk-yrs)    Types: Cigarettes    Start date: 05/19/1981    Quit date: 05/20/1991    Years since quitting: 31.9   Smokeless tobacco: Never   Tobacco comments:    Used to smoke a pack in 1-2 weeks/ 1992  Vaping Use   Vaping status: Never Used  Substance Use Topics   Alcohol use: Not Currently   Drug use: No     Allergies   Codeine, Dilaudid  [hydromorphone hcl], Tape, Vicodin [hydrocodone-acetaminophen], and Latex   Review of Systems Review of Systems   Physical Exam Triage Vital Signs ED Triage Vitals  Encounter Vitals Group     BP 04/23/23 1447 (!) 153/80     Systolic BP Percentile --      Diastolic BP Percentile --      Pulse Rate 04/23/23 1447 87     Resp 04/23/23 1447 18     Temp 04/23/23 1447 99.2 F (37.3 C)     Temp Source 04/23/23 1447 Oral     SpO2 04/23/23 1447 93 %     Weight --      Height --      Head Circumference --      Peak Flow --      Pain Score 04/23/23 1445 5     Pain Loc --      Pain Education --      Exclude from Growth Chart --    No data found.  Updated Vital Signs BP (!) 153/80 (BP Location: Right Arm)   Pulse 87   Temp 99.2 F (37.3 C) (Oral)   Resp 18   SpO2 93%   Visual Acuity Right Eye Distance:   Left Eye Distance:   Bilateral Distance:    Right Eye Near:   Left Eye Near:    Bilateral Near:     Physical Exam   UC Treatments / Results  Labs (all labs ordered are listed, but only abnormal results are displayed) Labs Reviewed - No data to display  EKG   Radiology No results found.  Procedures Procedures (including critical care time)  Medications Ordered in UC Medications - No data to display  Initial Impression / Assessment and Plan / UC Course  I have reviewed the triage vital signs and the nursing notes.  Pertinent labs & imaging results that were available during my care of the patient were reviewed by me and considered in my medical decision making (see chart for details).     *** Final Clinical Impressions(s) / UC  Diagnoses   Final diagnoses:  Hematoma of right lower extremity, initial encounter  Cellulitis of right lower extremity  Fall, initial encounter     Discharge Instructions      We will call if your x-ray comes back abnormal to further discuss this.  I recommend following up with orthopedics next week because the injury  appears to be more in the soft tissues just above the knee.  You may use compression wraps if tolerated, ice, elevation and I will send in a course of antibiotics as the abrasions to this area seem to be infected additionally.   ED Prescriptions     Medication Sig Dispense Auth. Provider   cephALEXin (KEFLEX) 500 MG capsule Take 1 capsule (500 mg total) by mouth 2 (two) times daily. 14 capsule Particia Nearing, [place]      PDMP not reviewed this encounter.

## 2023-04-23 NOTE — ED Triage Notes (Addendum)
Pt reports she fells on a pool spring x 1 week. She injured her right knee/thigh . Hurts to stand

## 2023-04-23 NOTE — Discharge Instructions (Signed)
We will call if your x-ray comes back abnormal to further discuss this.  I recommend following up with orthopedics next week because the injury appears to be more in the soft tissues just above the knee.  You may use compression wraps if tolerated, ice, elevation and I will send in a course of antibiotics as the abrasions to this area seem to be infected additionally.

## 2023-04-26 ENCOUNTER — Other Ambulatory Visit: Payer: Self-pay

## 2023-04-26 ENCOUNTER — Inpatient Hospital Stay (HOSPITAL_COMMUNITY)
Admission: EM | Admit: 2023-04-26 | Discharge: 2023-04-28 | DRG: 603 | Disposition: A | Discharge status: Home / Self Care | Payer: Medicaid Other | Attending: Internal Medicine | Admitting: Internal Medicine

## 2023-04-26 ENCOUNTER — Inpatient Hospital Stay (HOSPITAL_COMMUNITY): Payer: Medicaid Other

## 2023-04-26 ENCOUNTER — Encounter (HOSPITAL_COMMUNITY): Payer: Self-pay | Admitting: *Deleted

## 2023-04-26 ENCOUNTER — Emergency Department (HOSPITAL_COMMUNITY): Payer: Medicaid Other

## 2023-04-26 DIAGNOSIS — E785 Hyperlipidemia, unspecified: Secondary | ICD-10-CM | POA: Diagnosis present

## 2023-04-26 DIAGNOSIS — K766 Portal hypertension: Secondary | ICD-10-CM | POA: Diagnosis present

## 2023-04-26 DIAGNOSIS — D61818 Other pancytopenia: Secondary | ICD-10-CM | POA: Diagnosis present

## 2023-04-26 DIAGNOSIS — E8809 Other disorders of plasma-protein metabolism, not elsewhere classified: Secondary | ICD-10-CM | POA: Diagnosis present

## 2023-04-26 DIAGNOSIS — K219 Gastro-esophageal reflux disease without esophagitis: Secondary | ICD-10-CM | POA: Diagnosis present

## 2023-04-26 DIAGNOSIS — Z7985 Long-term (current) use of injectable non-insulin antidiabetic drugs: Secondary | ICD-10-CM | POA: Diagnosis not present

## 2023-04-26 DIAGNOSIS — E7800 Pure hypercholesterolemia, unspecified: Secondary | ICD-10-CM | POA: Diagnosis present

## 2023-04-26 DIAGNOSIS — Z87891 Personal history of nicotine dependence: Secondary | ICD-10-CM | POA: Diagnosis not present

## 2023-04-26 DIAGNOSIS — Z9049 Acquired absence of other specified parts of digestive tract: Secondary | ICD-10-CM

## 2023-04-26 DIAGNOSIS — I10 Essential (primary) hypertension: Secondary | ICD-10-CM | POA: Diagnosis present

## 2023-04-26 DIAGNOSIS — L02419 Cutaneous abscess of limb, unspecified: Principal | ICD-10-CM

## 2023-04-26 DIAGNOSIS — E1165 Type 2 diabetes mellitus with hyperglycemia: Secondary | ICD-10-CM | POA: Diagnosis present

## 2023-04-26 DIAGNOSIS — Z6841 Body Mass Index (BMI) 40.0 and over, adult: Secondary | ICD-10-CM

## 2023-04-26 DIAGNOSIS — Z7984 Long term (current) use of oral hypoglycemic drugs: Secondary | ICD-10-CM | POA: Diagnosis not present

## 2023-04-26 DIAGNOSIS — L03115 Cellulitis of right lower limb: Secondary | ICD-10-CM | POA: Diagnosis present

## 2023-04-26 DIAGNOSIS — E66813 Obesity, class 3: Secondary | ICD-10-CM | POA: Diagnosis present

## 2023-04-26 DIAGNOSIS — Z91018 Allergy to other foods: Secondary | ICD-10-CM

## 2023-04-26 DIAGNOSIS — L03119 Cellulitis of unspecified part of limb: Secondary | ICD-10-CM | POA: Diagnosis present

## 2023-04-26 DIAGNOSIS — Z23 Encounter for immunization: Secondary | ICD-10-CM

## 2023-04-26 DIAGNOSIS — K7460 Unspecified cirrhosis of liver: Secondary | ICD-10-CM | POA: Diagnosis present

## 2023-04-26 DIAGNOSIS — Z8249 Family history of ischemic heart disease and other diseases of the circulatory system: Secondary | ICD-10-CM | POA: Diagnosis not present

## 2023-04-26 DIAGNOSIS — Z90710 Acquired absence of both cervix and uterus: Secondary | ICD-10-CM | POA: Diagnosis not present

## 2023-04-26 DIAGNOSIS — Z79899 Other long term (current) drug therapy: Secondary | ICD-10-CM

## 2023-04-26 DIAGNOSIS — Z885 Allergy status to narcotic agent status: Secondary | ICD-10-CM

## 2023-04-26 DIAGNOSIS — E039 Hypothyroidism, unspecified: Secondary | ICD-10-CM | POA: Diagnosis present

## 2023-04-26 DIAGNOSIS — K7581 Nonalcoholic steatohepatitis (NASH): Secondary | ICD-10-CM | POA: Diagnosis present

## 2023-04-26 DIAGNOSIS — Z91040 Latex allergy status: Secondary | ICD-10-CM

## 2023-04-26 LAB — CBC WITH DIFFERENTIAL/PLATELET
Abs Immature Granulocytes: 0.01 10*3/uL (ref 0.00–0.07)
Basophils Absolute: 0 10*3/uL (ref 0.0–0.1)
Basophils Relative: 0 %
Eosinophils Absolute: 0.1 10*3/uL (ref 0.0–0.5)
Eosinophils Relative: 1 %
HCT: 35.9 % — ABNORMAL LOW (ref 36.0–46.0)
Hemoglobin: 12.3 g/dL (ref 12.0–15.0)
Immature Granulocytes: 0 %
Lymphocytes Relative: 32 %
Lymphs Abs: 1.3 10*3/uL (ref 0.7–4.0)
MCH: 35.2 pg — ABNORMAL HIGH (ref 26.0–34.0)
MCHC: 34.3 g/dL (ref 30.0–36.0)
MCV: 102.9 fL — ABNORMAL HIGH (ref 80.0–100.0)
Monocytes Absolute: 0.3 10*3/uL (ref 0.1–1.0)
Monocytes Relative: 8 %
Neutro Abs: 2.4 10*3/uL (ref 1.7–7.7)
Neutrophils Relative %: 59 %
Platelets: 65 10*3/uL — ABNORMAL LOW (ref 150–400)
RBC: 3.49 MIL/uL — ABNORMAL LOW (ref 3.87–5.11)
RDW: 14.1 % (ref 11.5–15.5)
WBC: 4.2 10*3/uL (ref 4.0–10.5)
nRBC: 0 % (ref 0.0–0.2)

## 2023-04-26 LAB — GLUCOSE, CAPILLARY: Glucose-Capillary: 129 mg/dL — ABNORMAL HIGH (ref 70–99)

## 2023-04-26 LAB — COMPREHENSIVE METABOLIC PANEL
ALT: 20 U/L (ref 0–44)
AST: 29 U/L (ref 15–41)
Albumin: 3.2 g/dL — ABNORMAL LOW (ref 3.5–5.0)
Alkaline Phosphatase: 93 U/L (ref 38–126)
Anion gap: 9 (ref 5–15)
BUN: 13 mg/dL (ref 6–20)
CO2: 23 mmol/L (ref 22–32)
Calcium: 8.6 mg/dL — ABNORMAL LOW (ref 8.9–10.3)
Chloride: 102 mmol/L (ref 98–111)
Creatinine, Ser: 0.8 mg/dL (ref 0.44–1.00)
GFR, Estimated: >60 mL/min (ref >60)
Glucose, Bld: 197 mg/dL — ABNORMAL HIGH (ref 70–99)
Potassium: 3.9 mmol/L (ref 3.5–5.1)
Sodium: 134 mmol/L — ABNORMAL LOW (ref 135–145)
Total Bilirubin: 4.3 mg/dL — ABNORMAL HIGH (ref 0.3–1.2)
Total Protein: 5.7 g/dL — ABNORMAL LOW (ref 6.5–8.1)

## 2023-04-26 LAB — PROTIME-INR
INR: 1.2 (ref 0.8–1.2)
Prothrombin Time: 15.3 s — ABNORMAL HIGH (ref 11.4–15.2)

## 2023-04-26 LAB — CK: Total CK: 59 U/L (ref 38–234)

## 2023-04-26 MED ORDER — SODIUM CHLORIDE 0.9 % IV SOLN
2.0000 g | Freq: Three times a day (TID) | INTRAVENOUS | Status: DC
  Administered 2023-04-27 – 2023-04-28 (×5): 2 g via INTRAVENOUS
  Filled 2023-04-26 (×5): qty 12.5

## 2023-04-26 MED ORDER — SODIUM CHLORIDE 0.9 % IV SOLN: INTRAVENOUS | Status: DC

## 2023-04-26 MED ORDER — ONDANSETRON HCL 4 MG/2ML IJ SOLN: 4.0000 mg | Freq: Four times a day (QID) | INTRAMUSCULAR | Status: DC | PRN

## 2023-04-26 MED ORDER — INSULIN ASPART 100 UNIT/ML IJ SOLN
0.0000 [IU] | Freq: Every day | INTRAMUSCULAR | Status: DC
  Administered 2023-04-27: 3 [IU] via SUBCUTANEOUS

## 2023-04-26 MED ORDER — VANCOMYCIN HCL 1750 MG/350ML IV SOLN
1750.0000 mg | INTRAVENOUS | Status: DC
  Administered 2023-04-27: 1750 mg via INTRAVENOUS
  Filled 2023-04-26: qty 350

## 2023-04-26 MED ORDER — KETOROLAC TROMETHAMINE 15 MG/ML IJ SOLN
15.0000 mg | Freq: Four times a day (QID) | INTRAMUSCULAR | Status: DC | PRN
  Administered 2023-04-27 – 2023-04-28 (×3): 15 mg via INTRAVENOUS
  Filled 2023-04-26 (×3): qty 1

## 2023-04-26 MED ORDER — ENOXAPARIN SODIUM 40 MG/0.4ML IJ SOSY: 40.0000 mg | PREFILLED_SYRINGE | INTRAMUSCULAR | Status: DC

## 2023-04-26 MED ORDER — VANCOMYCIN HCL 2000 MG/400ML IV SOLN
2000.0000 mg | Freq: Once | INTRAVENOUS | Status: AC
  Administered 2023-04-26: 2000 mg via INTRAVENOUS
  Filled 2023-04-26 (×2): qty 400

## 2023-04-26 MED ORDER — CEFAZOLIN SODIUM-DEXTROSE 1-4 GM/50ML-% IV SOLN
1.0000 g | Freq: Once | INTRAVENOUS | Status: AC
  Administered 2023-04-26: 1 g via INTRAVENOUS
  Filled 2023-04-26: qty 50

## 2023-04-26 MED ORDER — INSULIN ASPART 100 UNIT/ML IJ SOLN
0.0000 [IU] | Freq: Three times a day (TID) | INTRAMUSCULAR | Status: DC
  Administered 2023-04-27: 3 [IU] via SUBCUTANEOUS
  Administered 2023-04-28 (×2): 2 [IU] via SUBCUTANEOUS

## 2023-04-26 MED ORDER — PNEUMOCOCCAL 20-VAL CONJ VACC 0.5 ML IM SUSY
0.5000 mL | PREFILLED_SYRINGE | INTRAMUSCULAR | Status: AC
  Administered 2023-04-27: 0.5 mL via INTRAMUSCULAR
  Filled 2023-04-26: qty 0.5

## 2023-04-26 MED ORDER — ONDANSETRON HCL 4 MG PO TABS: 4.0000 mg | ORAL_TABLET | Freq: Four times a day (QID) | ORAL | Status: DC | PRN

## 2023-04-26 NOTE — ED Notes (Signed)
..ED TO INPATIENT HANDOFF REPORT  ED Nurse Name and Phone #: Buckner Malta EMTP  S Name/Age/Gender Candice Stanley 57 y.o. female Room/Bed: WA03/WA03  Code Status   Code Status: Prior  Home/SNF/Other Home Patient oriented to: self, place, time, and situation Is this baseline? Yes   Triage Complete: Triage complete  Chief Complaint Cellulitis of right thigh [L03.115]  Triage Note Pt fell on Oct 5th eiyh bruising rt lower thigh, has become read and swollen with ? Cellulitis noted.    Allergies Allergies  Allergen Reactions   Hydromorphone Nausea And Vomiting and Other (See Comments)    B/P dropped also   Vicodin [Hydrocodone-Acetaminophen] Nausea And Vomiting and Other (See Comments)    "Made me deathly sick"   Codeine Nausea And Vomiting   Hydrocodone Nausea And Vomiting and Other (See Comments)    "Made me deathly sick"   Onion Other (See Comments)    Headaches    Tape Swelling and Other (See Comments)    Rash(paper tape)   Latex Rash    Level of Care/Admitting Diagnosis ED Disposition     ED Disposition  Admit   Condition  --   Comment  Hospital Area: [facility] [[id number]]  Level of Care: Med-Surg [16]  May admit patient to [facility] or [facility] if equivalent level of care is available:: Yes  Covid Evaluation: Asymptomatic - no recent exposure (last 10 days) testing not required  Diagnosis: Cellulitis of right thigh [730065]  Admitting Physician: Rometta Emery [[id number]]  Attending Physician: Rometta Emery [[id number]]  Certification:: I certify this patient will need inpatient services for at least 2 midnights  Expected Medical Readiness: 04/29/2023          B Medical/Surgery History Past Medical History:  Diagnosis Date   Anxiety    Cirrhosis (HCC)    Depression    GERD (gastroesophageal reflux disease)    with grade d esophagitis in March 2023.   Hypercholesteremia    Hypertension    Hypothyroidism    Trimalleolar  fracture of ankle, closed, right, sequela    Type 2 diabetes mellitus (HCC)    Past Surgical History:  Procedure Laterality Date   ABDOMINAL HYSTERECTOMY     BIOPSY  09/21/2021   Procedure: BIOPSY;  Surgeon: Dolores Frame, MD;  [facility];  Service: Gastroenterology;;   CARDIAC CATHETERIZATION N/A 09/10/2015   Procedure: Left Heart Cath and Coronary Angiography;  Surgeon: Corky Crafts, MD;  Location: [facility] INVASIVE CV LAB;  Service: Cardiovascular;  Laterality: N/A;   CHOLECYSTECTOMY     COLONOSCOPY WITH PROPOFOL N/A 09/23/2022   Procedure: COLONOSCOPY WITH PROPOFOL;  Surgeon: Lanelle Bal, DO;  [facility];  Service: Endoscopy;  Laterality: N/A;  1:15 pn, asa 3   ESOPHAGEAL BRUSHING  09/21/2021   Procedure: ESOPHAGEAL BRUSHING;  Surgeon: Marguerita Merles, Reuel Boom, MD;  [facility];  Service: Gastroenterology;;   ESOPHAGOGASTRODUODENOSCOPY (EGD) WITH PROPOFOL N/A 09/21/2021   Surgeon: Dolores Frame, MD;  grade D esophagitis with scant bleeding, KOH negative, could not rule out small varices, but no medium to large varices, mild portal hypertensive gastropathy.  Pathology showed mild foveolar hyperplasia, negative for H. pylori.  Recommended repeat EGD in 3 months.   ESOPHAGOGASTRODUODENOSCOPY (EGD) WITH PROPOFOL N/A 09/23/2022   Surgeon: Lanelle Bal, DO; grade 1 esophageal varices, portal hypertensive gastropathy.  Recommended considering nonselective beta-blocker for primary prophylaxis.   OPEN REDUCTION INTERNAL FIXATION (ORIF) TIBIA/FIBULA FRACTURE  Right 08/04/2016   Procedure: OPEN REDUCTION INTERNAL FIXATION (ORIF) RIGHT TRIMALLEOLAR FRACTURE;  Surgeon: Toni Arthurs, MD;  Location: [facility];  Service: Orthopedics;  Laterality: Right;   POLYPECTOMY  09/23/2022   Procedure: POLYPECTOMY INTESTINAL;  Surgeon: Lanelle Bal, DO;  [facility];  Service: Endoscopy;;   WRIST SURGERY Left     ganglion cyst     A IV Location/Drains/Wounds Patient Lines/Drains/Airways Status     Active Line/Drains/Airways     Name Placement date Placement time Site Days   Peripheral IV 04/26/23 20 G Right Antecubital 04/26/23  1832  Antecubital  less than 1            Intake/Output Last 24 hours No intake or output data in the 24 hours ending 04/26/23 2118  Labs/Imaging Results for orders placed or performed during the hospital encounter of 04/26/23 (from the past 48 hour(s))  Comprehensive metabolic panel     Status: Abnormal   Collection Time: 04/26/23  6:46 PM  Result Value Ref Range   Sodium 134 (L) 135 - 145 mmol/L   Potassium 3.9 3.5 - 5.1 mmol/L   Chloride 102 98 - 111 mmol/L   CO2 23 22 - 32 mmol/L   Glucose, Bld 197 (H) 70 - 99 mg/dL    Comment: Glucose reference range applies only to samples taken after fasting for at least 8 hours.   BUN 13 6 - 20 mg/dL   Creatinine, Ser 0.98 0.44 - 1.00 mg/dL   Calcium 8.6 (L) 8.9 - 10.3 mg/dL   Total Protein 5.7 (L) 6.5 - 8.1 g/dL   Albumin 3.2 (L) 3.5 - 5.0 g/dL   AST 29 15 - 41 U/L   ALT 20 0 - 44 U/L   Alkaline Phosphatase 93 38 - 126 U/L   Total Bilirubin 4.3 (H) 0.3 - 1.2 mg/dL   GFR, Estimated >11 >91 mL/min    Comment: (NOTE) Calculated using the CKD-EPI Creatinine Equation (2021)    Anion gap 9 5 - 15    Comment: Performed at [facility], [address] [place], [place] [address]  CBC with Differential     Status: Abnormal   Collection Time: 04/26/23  6:46 PM  Result Value Ref Range   WBC 4.2 4.0 - 10.5 K/uL   RBC 3.49 (L) 3.87 - 5.11 MIL/uL   Hemoglobin 12.3 12.0 - 15.0 g/dL   HCT 56.2 (L) 13.0 - 86.5 %   MCV 102.9 (H) 80.0 - 100.0 fL   MCH 35.2 (H) 26.0 - 34.0 pg   MCHC 34.3 30.0 - 36.0 g/dL   RDW 78.4 69.6 - 29.5 %   Platelets 65 (L) 150 - 400 K/uL    Comment: Immature Platelet Fraction may be clinically indicated, consider ordering this additional test [id number]    nRBC 0.0 0.0 -  0.2 %   Neutrophils Relative % 59 %   Neutro Abs 2.4 1.7 - 7.7 K/uL   Lymphocytes Relative 32 %   Lymphs Abs 1.3 0.7 - 4.0 K/uL   Monocytes Relative 8 %   Monocytes Absolute 0.3 0.1 - 1.0 K/uL   Eosinophils Relative 1 %   Eosinophils Absolute 0.1 0.0 - 0.5 K/uL   Basophils Relative 0 %   Basophils Absolute 0.0 0.0 - 0.1 K/uL   Immature Granulocytes 0 %   Abs Immature Granulocytes 0.01 0.00 - 0.07 K/uL    Comment: Performed at [facility], [address] [place],  [place] [address]  Protime-INR     Status: Abnormal   Collection Time: 04/26/23  6:46 PM  Result Value Ref Range   Prothrombin Time 15.3 (H) 11.4 - 15.2 seconds   INR 1.2 0.8 - 1.2    Comment: (NOTE) INR goal varies based on device and disease states. Performed at [facility], [address], [place], [place] [address]   CK     Status: None   Collection Time: 04/26/23  6:46 PM  Result Value Ref Range   Total CK 59 38 - 234 U/L    Comment: Performed at [facility], [address], [place], [place] [address]   DG Femur 1 View Right  Result Date: 04/26/2023 CLINICAL DATA:  Evaluate for gas, question cellulitis. EXAM: RIGHT FEMUR 1 VIEW COMPARISON:  None Available. FINDINGS: There is no evidence of fracture or other focal bone lesions. Soft tissues are unremarkable. IMPRESSION: Negative. Electronically Signed   By: Darliss Cheney M.D.   On: 04/26/2023 21:05    Pending Labs Unresulted Labs (From admission, onward)    None       Vitals/Pain Today's Vitals   04/26/23 1708 04/26/23 1709 04/26/23 1745 04/26/23 2113  BP:  (!) 147/58 (!) 135/52 (!) 141/70  Pulse:  79 71 75  Resp:  18 12 18   Temp:  98.4 F (36.9 C)  97.9 F (36.6 C)  TempSrc:  Oral    SpO2:  98% 100% 97%  Weight: 250 lb (113.4 kg)     Height: 5\' 4"  (1.626 m)     PainSc: 0-No pain       Isolation Precautions No active isolations  Medications Medications  ceFAZolin (ANCEF) IVPB 1  g/50 mL premix (1 g Intravenous New Bag/Given 04/26/23 2024)    Mobility walks with person assist     Focused Assessments    R Recommendations: See Admitting Provider Note  Report given to:   Additional Notes:

## 2023-04-26 NOTE — H&P (Signed)
History and Physical    Patient: Candice Stanley JWJ:[id number] DOB: 1965/09/28 DOA: 04/26/2023 DOS: the patient was seen and examined on 04/26/2023 PCP: Benita Stabile, MD  Patient coming from: Home  Chief Complaint:  Chief Complaint  Patient presents with   Leg infection   HPI: Candice Stanley is a 57 y.o. female with medical history significant of liver cirrhosis, anxiety disorder, GERD, hyperlipidemia, essential hypertension, type 2 diabetes and hypothyroidism who apparently scratched the right medial aspect of her thigh about a week ago when she had a mechanical fall around her pool.  Initially was a bruise.  It started getting worse on initial oral antibiotics.  She went to see her doctor on Monday.  At that time it was more like a blister.  Patient was placed on Keflex and what appeared to be Bactrim.  She was seen by Dr. Shelle Iron the orthopedic surgeon who prescribed antibiotics.  She went back today for review and for patient the area was worse and she was asked to come to the ER for further evaluation and treatment.  Patient appears to have failed outpatient oral antibiotics.  She is here for IV antibiotic therapy.  Denied any fever or chills.  She has significant cellulitis that seems to be significant wound.  Since patient has failed outpatient therapy will admit to the patient for inpatient treatment.  Review of Systems: As mentioned in the history of present illness. All other systems reviewed and are negative. Past Medical History:  Diagnosis Date   Anxiety    Cirrhosis (HCC)    Depression    GERD (gastroesophageal reflux disease)    with grade d esophagitis in March 2023.   Hypercholesteremia    Hypertension    Hypothyroidism    Trimalleolar fracture of ankle, closed, right, sequela    Type 2 diabetes mellitus (HCC)    Past Surgical History:  Procedure Laterality Date   ABDOMINAL HYSTERECTOMY     BIOPSY  09/21/2021   Procedure: BIOPSY;  Surgeon: Dolores Frame, MD;   Location: [facility];  Service: Gastroenterology;;   CARDIAC CATHETERIZATION N/A 09/10/2015   Procedure: Left Heart Cath and Coronary Angiography;  Surgeon: Corky Crafts, MD;  Location: [facility] INVASIVE CV LAB;  Service: Cardiovascular;  Laterality: N/A;   CHOLECYSTECTOMY     COLONOSCOPY WITH PROPOFOL N/A 09/23/2022   Procedure: COLONOSCOPY WITH PROPOFOL;  Surgeon: Lanelle Bal, DO;  Location: [facility];  Service: Endoscopy;  Laterality: N/A;  1:15 pn, asa 3   ESOPHAGEAL BRUSHING  09/21/2021   Procedure: ESOPHAGEAL BRUSHING;  Surgeon: Marguerita Merles, Reuel Boom, MD;  Location: [facility];  Service: Gastroenterology;;   ESOPHAGOGASTRODUODENOSCOPY (EGD) WITH PROPOFOL N/A 09/21/2021   Surgeon: Dolores Frame, MD;  grade D esophagitis with scant bleeding, KOH negative, could not rule out small varices, but no medium to large varices, mild portal hypertensive gastropathy.  Pathology showed mild foveolar hyperplasia, negative for H. pylori.  Recommended repeat EGD in 3 months.   ESOPHAGOGASTRODUODENOSCOPY (EGD) WITH PROPOFOL N/A 09/23/2022   Surgeon: Lanelle Bal, DO; grade 1 esophageal varices, portal hypertensive gastropathy.  Recommended considering nonselective beta-blocker for primary prophylaxis.   OPEN REDUCTION INTERNAL FIXATION (ORIF) TIBIA/FIBULA FRACTURE Right 08/04/2016   Procedure: OPEN REDUCTION INTERNAL FIXATION (ORIF) RIGHT TRIMALLEOLAR FRACTURE;  Surgeon: Toni Arthurs, MD;  Location: [facility];  Service: Orthopedics;  Laterality: Right;   POLYPECTOMY  09/23/2022   Procedure: POLYPECTOMY INTESTINAL;  Surgeon: Lanelle Bal, DO;  Location:  [facility];  Service: Endoscopy;;   WRIST SURGERY Left    ganglion cyst   Social History:  reports that she quit smoking about 31 years ago. Her smoking use included cigarettes. She started smoking about 41 years ago. She has a 2.5 pack-year smoking history. She has never used smokeless tobacco. She  reports that she does not currently use alcohol. She reports that she does not use drugs.  Allergies  Allergen Reactions   Hydromorphone Nausea And Vomiting and Other (See Comments)    B/P dropped also   Vicodin [Hydrocodone-Acetaminophen] Nausea And Vomiting and Other (See Comments)    "Made me deathly sick"   Codeine Nausea And Vomiting   Hydrocodone Nausea And Vomiting and Other (See Comments)    "Made me deathly sick"   Onion Other (See Comments)    Headaches    Tape Swelling and Other (See Comments)    Rash(paper tape)   Latex Rash    Family History  Problem Relation Age of Onset   Hypertension Mother    Cirrhosis Mother        NASH   Coronary artery disease Father    Heart disease Father    Barrett's esophagus Father    Coronary artery disease Paternal Uncle    Coronary artery disease Cousin    Colon cancer Neg Hx     Prior to Admission medications   Medication Sig Start Date End Date Taking? Authorizing Provider  amLODipine (NORVASC) 5 MG tablet Take 5 mg by mouth daily.   Yes [provider]  calcium carbonate (TUMS - DOSED IN MG ELEMENTAL CALCIUM) 500 MG chewable tablet Chew 2 tablets by mouth daily as needed for indigestion or heartburn. 11/01/21  Yes [provider]  cephALEXin (KEFLEX) 500 MG capsule Take 500 mg by mouth 4 (four) times daily. 04/26/23 05/03/23 Yes [provider]  Cholecalciferol (VITAMIN D3) 50 MCG (2000 UT) TABS Take 2,000 Units by mouth daily.   Yes [provider]  doxycycline (MONODOX) 100 MG capsule Take 100 mg by mouth 2 (two) times daily with a meal. 04/24/23 05/01/23 Yes [provider]  ELDERBERRY PO Take 50 mg by mouth daily.   Yes [provider]  FLUoxetine (PROZAC) 40 MG capsule Take 40 mg by mouth daily. 08/15/19  Yes [provider]  furosemide (LASIX) 40 MG tablet Take 1 tablet (40 mg total) by mouth daily. 11/23/22  Yes Letta Median, PA-C  hydrOXYzine (ATARAX) 25 MG  tablet Take 50 mg by mouth at bedtime as needed (for sleep).   Yes [provider]  lactulose (CHRONULAC) 10 GM/15ML solution Take 30 mLs (20 g total) by mouth daily as needed for mild constipation. Goal of 2-3 soft bowel movements daily. 12/08/22  Yes Letta Median, PA-C  loratadine (CLARITIN) 10 MG tablet Take 10 mg by mouth daily.   Yes [provider]  meclizine (ANTIVERT) 25 MG tablet Take 25 mg by mouth 3 (three) times daily as needed for dizziness or nausea. 08/30/22  Yes [provider]  metFORMIN (GLUCOPHAGE) 500 MG tablet Take 500 mg by mouth 2 (two) times daily with a meal.   Yes [provider]  Milk Thistle 1000 MG CAPS Take 1,000 mg by mouth daily.   Yes [provider]  MOUNJARO 10 MG/0.5ML Pen Inject 10 mg into the skin once a week. 02/28/23  Yes [provider]  nadolol (CORGARD) 20 MG tablet Take 1 tablet (20 mg total) by mouth  daily. 12/28/22  Yes Clearance Coots, Gwendalyn Ege, PA-C  ondansetron (ZOFRAN) 4 MG tablet Take 4 mg by mouth 2 (two) times daily as needed for nausea or vomiting. 11/02/22  Yes [provider]  pantoprazole (PROTONIX) 40 MG tablet TAKE 1 TABLET BY MOUTH TWICE DAILY BEFORE A MEAL 01/23/23  Yes Ermalinda Memos S, PA-C  spironolactone (ALDACTONE) 100 MG tablet Take 1 tablet (100 mg total) by mouth daily. 11/23/22  Yes Harper, Kristen S, PA-C  TYLENOL 500 MG tablet Take 500-1,000 mg by mouth every 6 (six) hours as needed for mild pain (pain score 1-3) or headache.   Yes [provider]  vitamin C (ASCORBIC ACID) 500 MG tablet Take 500 mg by mouth daily.    Yes [provider]  zinc gluconate 50 MG tablet Take 50 mg by mouth daily.   Yes [provider]  albuterol (VENTOLIN HFA) 108 (90 Base) MCG/ACT inhaler Inhale 1-2 puffs into the lungs every 6 (six) hours as needed for wheezing or shortness of breath. Patient not taking: Reported on 04/26/2023 06/28/21   Wallis Bamberg, PA-C  cephALEXin  (KEFLEX) 500 MG capsule Take 1 capsule (500 mg total) by mouth 2 (two) times daily. Patient not taking: Reported on 04/26/2023 04/23/23   Particia Nearing, PA-C  fluticasone [facility]) 110 MCG/ACT inhaler Inhale 2 puffs into the lungs in the morning and at bedtime for 10 days. Patient not taking: Reported on 04/26/2023 07/08/21 04/26/23  Eber Hong, MD    Physical Exam: Vitals:   04/26/23 1708 04/26/23 1709 04/26/23 1745 04/26/23 2113  BP:  (!) 147/58 (!) 135/52 (!) 141/70  Pulse:  79 71 75  Resp:  18 12 18   Temp:  98.4 F (36.9 C)  97.9 F (36.6 C)  TempSrc:  Oral    SpO2:  98% 100% 97%  Weight: 113.4 kg     Height: 5\' 4"  (1.626 m)      Constitutional: NAD, calm, comfortable Eyes: PERRL, lids and conjunctivae normal ENMT: Mucous membranes are moist. Posterior pharynx clear of any exudate or lesions.Normal dentition.  Neck: normal, supple, no masses, no thyromegaly Respiratory: clear to auscultation bilaterally, no wheezing, no crackles. Normal respiratory effort. No accessory muscle use.  Cardiovascular: Regular rate and rhythm, no murmurs / rubs / gallops. No extremity edema. 2+ pedal pulses. No carotid bruits.  Abdomen: no tenderness, no masses palpated. No hepatosplenomegaly. Bowel sounds positive.  Musculoskeletal: Good range of motion, no joint swelling or tenderness, Skin: Right medial thigh also, hyperemic with surrounding cellulitis, Neurologic: CN 2-12 grossly intact. Sensation intact, DTR normal. Strength 5/5 in all 4.  Psychiatric: Normal judgment and insight. Alert and oriented x 3. Normal mood  Data Reviewed:  Blood pressure 147/58, white count 4.2 hemoglobin 12.3 platelets 65.  Sodium 134 glucose 197 calcium 8.6.  X-ray of the right thigh showed no acute findings.  Ultrasound showed right lower extremity pending.  Assessment and Plan:  #1 cellulitis and infected wound of the right medial thigh: Patient is a diabetic and failed outpatient treatment.   Most likely polymicrobial infection.  We will admit the patient and initiate IV vancomycin and cefepime for broad-spectrum coverage.  Blood culture sent.  Patient was already seen by orthopedics in the outpatient setting.  If no immediate improvement we may consult orthopedics again.  In the meantime continue IV antibiotics.  Consider wound cultures.  #2 type II diabetes: Sliding scale insulin.  #3 liver cirrhosis: Largely compensated.  Continue home therapy.  #4 morbid  obesity: Dietary counseling  #5 hypothyroidism: Continue with levothyroxine.  #6 GERD: Continue PPIs  #7 essential hypertension: Confirm on resume home regimen.      Advance Care Planning:   Code Status: Full Code   Consults: None but may need orthopedic consult  Family Communication: No family at bedside  Severity of Illness: The appropriate patient status for this patient is INPATIENT. Inpatient status is judged to be reasonable and necessary in order to provide the required intensity of service to ensure the patient's safety. The patient's presenting symptoms, physical exam findings, and initial radiographic and laboratory data in the context of their chronic comorbidities is felt to place them at high risk for further clinical deterioration. Furthermore, it is not anticipated that the patient will be medically stable for discharge from the hospital within 2 midnights of admission.   * I certify that at the point of admission it is my clinical judgment that the patient will require inpatient hospital care spanning beyond 2 midnights from the point of admission due to high intensity of service, high risk for further deterioration and high frequency of surveillance required.*  AuthorLonia Blood, MD 04/26/2023 9:37 PM  For on call review [email or web address].

## 2023-04-26 NOTE — Progress Notes (Signed)
Pharmacy Antibiotic Note  Candice Stanley is a 57 y.o. female admitted on 04/26/2023 with leg infection, had a mechanical fall a few days ago and started having some redness and swelling. Pharmacy has been consulted for vancomycin and cefepime dosing.  Plan: Vancomycin 2gm IV x 1 then 1750mg  q24h (AUC 526.1, Scr 0.8) Cefepime 2gm IV q8h Follow renal function, cultures and clinical course  Height: 5\' 4"  (162.6 cm) Weight: 113.4 kg (250 lb) IBW/kg (Calculated) : 54.7  Temp (24hrs), Avg:98 F (36.7 C), Min:97.6 F (36.4 C), Max:98.4 F (36.9 C)  Recent Labs  Lab 04/26/23 1846  WBC 4.2  CREATININE 0.80    Estimated Creatinine Clearance: 93.5 mL/min (by C-G formula based on SCr of 0.8 mg/dL).    Allergies  Allergen Reactions   Hydromorphone Nausea And Vomiting and Other (See Comments)    B/P dropped also   Vicodin [Hydrocodone-Acetaminophen] Nausea And Vomiting and Other (See Comments)    "Made me deathly sick"   Codeine Nausea And Vomiting   Hydrocodone Nausea And Vomiting and Other (See Comments)    "Made me deathly sick"   Onion Other (See Comments)    Headaches    Tape Swelling and Other (See Comments)    Rash(paper tape)   Latex Rash    Antimicrobials this admission: 10/16 ancef x 1 10/16 vanc >> 10/17 cefepime >>  Dose adjustments this admission:   Microbiology results:   Thank you for allowing pharmacy to be a part of this patient's care.  Arley Phenix RPh 04/26/2023, 11:18 PM

## 2023-04-26 NOTE — ED Triage Notes (Signed)
Pt fell on Oct 5th eiyh bruising rt lower thigh, has become read and swollen with ? Cellulitis noted.

## 2023-04-26 NOTE — ED Provider Notes (Signed)
[place] EMERGENCY DEPARTMENT AT [facility] Provider Note   CSN: [id number] Arrival date & time: 04/26/23  1650     History  Chief Complaint  Patient presents with   Leg infection    Candice Stanley is a 57 y.o. female.  HPI    57 year old female comes in with chief complaint of leg infection. Patient has history of Elita Boone liver cirrhosis and thrombocytopenia.  She indicates that she had a mechanical fall at pool side few days back.  She had bruising to her right thigh.  Thereafter she started having some redness, went to her PCP.  Patient was started on antibiotics and advised to follow-up with orthopedic surgery.  She followed up with orthopedic surgery, Dr. Shelle Iron, who advised that patient take Keflex 4 times daily and also added MRSA coverage.  Patient was advised to be seen again in the clinic today, and a blister at the site of infection was noted, along with increasing redness and swelling.  She was advised to come to the ER for admission.  Patient denies any nausea, vomiting, fevers, chills.  Home Medications Prior to Admission medications   Medication Sig Start Date End Date Taking? Authorizing Provider  amLODipine (NORVASC) 5 MG tablet Take 5 mg by mouth daily.   Yes [provider]  calcium carbonate (TUMS - DOSED IN MG ELEMENTAL CALCIUM) 500 MG chewable tablet Chew 2 tablets by mouth daily as needed for indigestion or heartburn. 11/01/21  Yes [provider]  cephALEXin (KEFLEX) 500 MG capsule Take 500 mg by mouth 4 (four) times daily. 04/26/23 05/03/23 Yes [provider]  Cholecalciferol (VITAMIN D3) 50 MCG (2000 UT) TABS Take 2,000 Units by mouth daily.   Yes [provider]  doxycycline (MONODOX) 100 MG capsule Take 100 mg by mouth 2 (two) times daily with a meal. 04/24/23 05/01/23 Yes [provider]  ELDERBERRY PO Take 50 mg by mouth daily.   Yes [provider]  FLUoxetine (PROZAC) 40 MG capsule Take  40 mg by mouth daily. 08/15/19  Yes [provider]  furosemide (LASIX) 40 MG tablet Take 1 tablet (40 mg total) by mouth daily. 11/23/22  Yes Letta Median, PA-C  hydrOXYzine (ATARAX) 25 MG tablet Take 50 mg by mouth at bedtime as needed (for sleep).   Yes [provider]  lactulose (CHRONULAC) 10 GM/15ML solution Take 30 mLs (20 g total) by mouth daily as needed for mild constipation. Goal of 2-3 soft bowel movements daily. 12/08/22  Yes Letta Median, PA-C  loratadine (CLARITIN) 10 MG tablet Take 10 mg by mouth daily.   Yes [provider]  meclizine (ANTIVERT) 25 MG tablet Take 25 mg by mouth 3 (three) times daily as needed for dizziness or nausea. 08/30/22  Yes [provider]  metFORMIN (GLUCOPHAGE) 500 MG tablet Take 500 mg by mouth 2 (two) times daily with a meal.   Yes [provider]  Milk Thistle 1000 MG CAPS Take 1,000 mg by mouth daily.   Yes [provider]  MOUNJARO 10 MG/0.5ML Pen Inject 10 mg into the skin once a week. 02/28/23  Yes [provider]  nadolol (CORGARD) 20 MG tablet Take 1 tablet (20 mg total) by mouth daily. 12/28/22  Yes Ermalinda Memos S, PA-C  ondansetron (ZOFRAN) 4 MG tablet Take 4 mg by mouth 2 (two) times daily as needed for nausea or vomiting. 11/02/22  Yes [provider]  pantoprazole (PROTONIX) 40 MG tablet TAKE 1  TABLET BY MOUTH TWICE DAILY BEFORE A MEAL 01/23/23  Yes Letta Median, PA-C  spironolactone (ALDACTONE) 100 MG tablet Take 1 tablet (100 mg total) by mouth daily. 11/23/22  Yes Harper, Kristen S, PA-C  TYLENOL 500 MG tablet Take 500-1,000 mg by mouth every 6 (six) hours as needed for mild pain (pain score 1-3) or headache.   Yes [provider]  vitamin C (ASCORBIC ACID) 500 MG tablet Take 500 mg by mouth daily.    Yes [provider]  zinc gluconate 50 MG tablet Take 50 mg by mouth daily.   Yes [provider]  albuterol (VENTOLIN HFA) 108 (90 Base)  MCG/ACT inhaler Inhale 1-2 puffs into the lungs every 6 (six) hours as needed for wheezing or shortness of breath. Patient not taking: Reported on 04/26/2023 06/28/21   Wallis Bamberg, PA-C  cephALEXin (KEFLEX) 500 MG capsule Take 1 capsule (500 mg total) by mouth 2 (two) times daily. Patient not taking: Reported on 04/26/2023 04/23/23   Particia Nearing, PA-C  fluticasone [facility]) 110 MCG/ACT inhaler Inhale 2 puffs into the lungs in the morning and at bedtime for 10 days. Patient not taking: Reported on 04/26/2023 07/08/21 04/26/23  Eber Hong, MD      Allergies    Hydromorphone, Vicodin [hydrocodone-acetaminophen], Codeine, Hydrocodone, Onion, Tape, and Latex    Review of Systems   Review of Systems  All other systems reviewed and are negative.   Physical Exam Updated Vital Signs BP (!) 135/52   Pulse 71   Temp 98.4 F (36.9 C) (Oral)   Resp 12   Ht 5\' 4"  (1.626 m)   Wt 113.4 kg   SpO2 100%   BMI 42.91 kg/m  Physical Exam Vitals and nursing note reviewed.  Constitutional:      Appearance: She is well-developed.  HENT:     Head: Normocephalic and atraumatic.  Eyes:     Extraocular Movements: Extraocular movements intact.  Cardiovascular:     Rate and Rhythm: Normal rate.  Pulmonary:     Effort: Pulmonary effort is normal.  Musculoskeletal:        General: Swelling and tenderness present.     Cervical back: Normal range of motion and neck supple.     Comments: Fluctuant, tender femur on the right side  Skin:    General: Skin is dry.  Neurological:     Mental Status: She is alert and oriented to person, place, and time.        ED Results / Procedures / Treatments   Labs (all labs ordered are listed, but only abnormal results are displayed) Labs Reviewed  COMPREHENSIVE METABOLIC PANEL - Abnormal; Notable for the following components:      Result Value   Sodium 134 (*)    Glucose, Bld 197 (*)    Calcium 8.6 (*)    Total Protein 5.7 (*)     Albumin 3.2 (*)    Total Bilirubin 4.3 (*)    All other components within normal limits  CBC WITH DIFFERENTIAL/PLATELET - Abnormal; Notable for the following components:   RBC 3.49 (*)    HCT 35.9 (*)    MCV 102.9 (*)    MCH 35.2 (*)    Platelets 65 (*)    All other components within normal limits  PROTIME-INR - Abnormal; Notable for the following components:   Prothrombin Time 15.3 (*)    All other components within normal limits  CK    EKG None  Radiology  No results found.  Procedures Procedures    Medications Ordered in ED Medications  ceFAZolin (ANCEF) IVPB 1 g/50 mL premix (1 g Intravenous New Bag/Given 04/26/23 2024)    ED Course/ Medical Decision Making/ A&P                                 Medical Decision Making Amount and/or Complexity of Data Reviewed Labs: ordered. Radiology: ordered.  Risk Prescription drug management. Decision regarding hospitalization.   57 year old female with history of Elita Boone liver cirrhosis, thrombocytopenia comes in with chief complaint of worsening leg swelling and blistering.  I have reviewed patient's PCP records and her medications.  I reviewed patient's past medical history.  Based on history and exam, differential diagnosis considered for this patient includes cellulitis, myositis, abscess.  Low suspicion for sepsis.  Patient has no SIRS criteria on her vital signs and no systemic symptoms at all.  Plan is to get basic labs, ultrasound soft tissue. We will start her on IV antibiotics.  I have ordered ultrasound, which will be completed tomorrow.  Medicine team will follow-up on the results.  Orthopedic surgery can be consulted if there is abscess.    Final Clinical Impression(s) / ED Diagnoses Final diagnoses:  Cellulitis and abscess of leg    Rx / DC Orders ED Discharge Orders     None         Derwood Kaplan, MD 04/26/23 2056

## 2023-04-27 DIAGNOSIS — L03115 Cellulitis of right lower limb: Secondary | ICD-10-CM | POA: Diagnosis not present

## 2023-04-27 LAB — COMPREHENSIVE METABOLIC PANEL
ALT: 18 U/L (ref 0–44)
AST: 24 U/L (ref 15–41)
Albumin: 2.6 g/dL — ABNORMAL LOW (ref 3.5–5.0)
Alkaline Phosphatase: 85 U/L (ref 38–126)
Anion gap: 5 (ref 5–15)
BUN: 12 mg/dL (ref 6–20)
CO2: 25 mmol/L (ref 22–32)
Calcium: 8.4 mg/dL — ABNORMAL LOW (ref 8.9–10.3)
Chloride: 106 mmol/L (ref 98–111)
Creatinine, Ser: 0.67 mg/dL (ref 0.44–1.00)
GFR, Estimated: >60 mL/min (ref >60)
Glucose, Bld: 174 mg/dL — ABNORMAL HIGH (ref 70–99)
Potassium: 3.6 mmol/L (ref 3.5–5.1)
Sodium: 136 mmol/L (ref 135–145)
Total Bilirubin: 3 mg/dL — ABNORMAL HIGH (ref 0.3–1.2)
Total Protein: 5 g/dL — ABNORMAL LOW (ref 6.5–8.1)

## 2023-04-27 LAB — CBC
HCT: 31.3 % — ABNORMAL LOW (ref 36.0–46.0)
Hemoglobin: 10.8 g/dL — ABNORMAL LOW (ref 12.0–15.0)
MCH: 35.4 pg — ABNORMAL HIGH (ref 26.0–34.0)
MCHC: 34.5 g/dL (ref 30.0–36.0)
MCV: 102.6 fL — ABNORMAL HIGH (ref 80.0–100.0)
Platelets: 53 10*3/uL — ABNORMAL LOW (ref 150–400)
RBC: 3.05 MIL/uL — ABNORMAL LOW (ref 3.87–5.11)
RDW: 13.9 % (ref 11.5–15.5)
WBC: 2.9 10*3/uL — ABNORMAL LOW (ref 4.0–10.5)
nRBC: 0 % (ref 0.0–0.2)

## 2023-04-27 LAB — GLUCOSE, CAPILLARY
Glucose-Capillary: 116 mg/dL — ABNORMAL HIGH (ref 70–99)
Glucose-Capillary: 82 mg/dL (ref 70–99)
Glucose-Capillary: 151 mg/dL — ABNORMAL HIGH (ref 70–99)
Glucose-Capillary: 172 mg/dL — ABNORMAL HIGH (ref 70–99)

## 2023-04-27 MED ORDER — LACTULOSE 10 GM/15ML PO SOLN: 20.0000 g | Freq: Every day | ORAL | Status: DC | PRN

## 2023-04-27 MED ORDER — NADOLOL 20 MG PO TABS
20.0000 mg | ORAL_TABLET | Freq: Every day | ORAL | Status: DC
  Administered 2023-04-27 – 2023-04-28 (×2): 20 mg via ORAL
  Filled 2023-04-27 (×2): qty 1

## 2023-04-27 MED ORDER — FUROSEMIDE 40 MG PO TABS
40.0000 mg | ORAL_TABLET | Freq: Every day | ORAL | Status: DC
  Administered 2023-04-27 – 2023-04-28 (×2): 40 mg via ORAL
  Filled 2023-04-27 (×2): qty 1

## 2023-04-27 MED ORDER — FLUOXETINE HCL 20 MG PO CAPS
40.0000 mg | ORAL_CAPSULE | Freq: Every day | ORAL | Status: DC
  Administered 2023-04-27 – 2023-04-28 (×2): 40 mg via ORAL
  Filled 2023-04-27 (×2): qty 2

## 2023-04-27 MED ORDER — SPIRONOLACTONE 25 MG PO TABS
100.0000 mg | ORAL_TABLET | Freq: Every day | ORAL | Status: DC
  Administered 2023-04-27 – 2023-04-28 (×2): 100 mg via ORAL
  Filled 2023-04-27 (×2): qty 4

## 2023-04-27 NOTE — Consult Note (Signed)
WOC Nurse Consult Note: Reason for Consult: right let wound Wound type: trauma with associated cellulitis  Pressure Injury POA: NA Measurement: see nursing flow sheets Wound bed: blistered area; serous; with superficial skin loss  Drainage (amount, consistency, odor) serous, minimal  Periwound: edema Dressing procedure/placement/frequency: Cleanse knee wound with saline, pat dry Apply single layer of xeroform gauze, top with dry dressing or silicone foam Apply ACE with mild tension for compression. Change daily while inpatient.     Re consult if needed, will not follow at this time. Thanks  Cieara Stierwalt [employer], RN,CWOCN, CNS, CWON-[facility] [phone])

## 2023-04-27 NOTE — TOC Initial Note (Signed)
Transition of Care [facility]) - Initial/Assessment Note    Patient Details  Name: Candice Stanley MRN: [id number] Date of Birth: June 28, 1966  Transition of Care [facility]) CM/SW Contact:    Harriett Sine, RN Phone Number:[phone]  04/27/2023, 11:28 AM  Clinical Narrative:                 Pt from home, pt has pcp, Spoke with pt at bedside about wound care and d/c plans. Pt states she wants someone to help with wound care. No SDOH needs at this time, TOC following        Patient Goals and CMS Choice            Expected Discharge Plan and Services                                              Prior Living Arrangements/Services                       Activities of Daily Living   ADL Screening (condition at time of admission) Independently performs ADLs?: Yes (appropriate for developmental age) Is the patient deaf or have difficulty hearing?: No Does the patient have difficulty seeing, even when wearing glasses/contacts?: No Does the patient have difficulty concentrating, remembering, or making decisions?: No  Permission Sought/Granted                  Emotional Assessment              Admission diagnosis:  Cellulitis and abscess of leg [L03.119, L02.419] Cellulitis of right thigh [L03.115] Patient Active Problem List   Diagnosis Date Noted   Cellulitis of right thigh 04/26/2023   Esophageal varices (HCC) 12/29/2022   Pain of upper abdomen 11/23/2022   Constipation 11/23/2022   Abdominal bloating 11/23/2022   Incarcerated umbilical hernia    Gastroesophageal reflux disease with esophagitis without hemorrhage 10/21/2021   Colon cancer screening 10/21/2021   Pancytopenia (HCC) 09/24/2021   Acute esophagitis    Acute pulmonary edema (HCC) 09/21/2021   Acute respiratory failure with hypoxia (HCC) 09/18/2021   Essential hypertension 09/18/2021   Obesity, Class III, BMI 40-49.9 (morbid obesity) (HCC) 09/18/2021   Thrombocytopenia (HCC)  09/18/2021   Lobar pneumonia (HCC) 09/18/2021   Cirrhosis of liver without ascites (HCC) 09/18/2021   Severe sepsis (HCC) 09/18/2021   Hypomagnesemia 09/18/2021   Hypothyroidism 06/05/2018   Uncontrolled type 2 diabetes mellitus with hyperglycemia, without long-term current use of insulin (HCC) 06/05/2018   Hypophosphatemia 06/05/2018   Depression with anxiety 06/05/2018   Chronic leukopenia 06/05/2018   Hypokalemia 06/05/2018   Chronic cough 12/06/2016   Precordial pain    Family history of early CAD    Atypical chest pain 09/02/2015   Dysphagia 04/16/2012   Odynophagia 04/16/2012   Unspecified hypothyroidism 11/03/2009   Hyperlipidemia 11/03/2009   DYSTHYMIC DISORDER 11/03/2009   GERD (gastroesophageal reflux disease) 11/03/2009   Diaphragmatic hernia 11/03/2009   CHEST PAIN UNSPECIFIED 11/03/2009   PCP:  Benita Stabile, MD Pharmacy:   [facility] [address] [place] - [address] [place] [address] Phone: [phone] Fax: [phone]     Social Determinants of Health (SDOH) Social History: SDOH Screenings   Food Insecurity: No Food Insecurity (04/26/2023)  Housing: Low Risk  (04/26/2023)  Transportation Needs: No Transportation Needs (  04/26/2023)  Utilities: Not At Risk (04/26/2023)  Tobacco Use: Medium Risk (04/26/2023)   SDOH Interventions:     Readmission Risk Interventions    09/20/2021   12:37 PM  Readmission Risk Prevention Plan  Transportation Screening Complete  HRI or Home Care Consult Complete  Palliative Care Screening Not Applicable  Medication Review (RN Care Manager) Complete

## 2023-04-27 NOTE — Plan of Care (Signed)
  Problem: Coping: Goal: Ability to adjust to condition or change in health will improve Outcome: Progressing   Problem: Fluid Volume: Goal: Ability to maintain a balanced intake and output will improve Outcome: Progressing   Problem: Health Behavior/Discharge Planning: Goal: Ability to manage health-related needs will improve Outcome: Progressing   Problem: Metabolic: Goal: Ability to maintain appropriate glucose levels will improve Outcome: Progressing   Problem: Nutritional: Goal: Maintenance of adequate nutrition will improve Outcome: Progressing   Problem: Skin Integrity: Goal: Risk for impaired skin integrity will decrease Outcome: Progressing   Problem: Education: Goal: Knowledge of General Education information will improve Description: Including pain rating scale, medication(s)/side effects and non-pharmacologic comfort measures Outcome: Progressing   Problem: Health Behavior/Discharge Planning: Goal: Ability to manage health-related needs will improve Outcome: Progressing   Problem: Clinical Measurements: Goal: Ability to maintain clinical measurements within normal limits will improve Outcome: Progressing Goal: Will remain free from infection Outcome: Progressing   Problem: Activity: Goal: Risk for activity intolerance will decrease Outcome: Progressing   Problem: Nutrition: Goal: Adequate nutrition will be maintained Outcome: Progressing   Problem: Coping: Goal: Level of anxiety will decrease Outcome: Progressing   Problem: Skin Integrity: Goal: Risk for impaired skin integrity will decrease Outcome: Progressing

## 2023-04-27 NOTE — Progress Notes (Signed)
[facility] PROGRESS NOTE  Candice Stanley (DOB: [birth year]) NWG:[id number] PCP: Candice Stabile, MD  Brief Narrative: Candice Stanley is a 57 y.o. female with a history of NASH cirrhosis, T2DM, HTN, HLD, obesity among others who presented to the ED on 04/26/2023 on the advice of orthopedic surgery due to progressive worsening of right thigh cellulitis despite taking oral antibiotics. She was admitted for cellulitis failing outpatient management, started on IV antibiotics after cultures drawn.   Subjective: Redness and tenderness are improved from yesterday. No fevers.   Objective: BP 101/68   Pulse 63   Temp 98 F (36.7 C) (Oral)   Resp 18   Ht 5\' 4"  (1.626 m)   Wt 113.4 kg   SpO2 98%   BMI 42.91 kg/m   Gen: No distress, obese, pleasant Pulm: Clear, nonlabored  CV: RRR GI: Soft, NT, ND, +BS Neuro: Alert and oriented. No new focal deficits. Ext: Warm, no deformities. Skin: Dense induration and violaceous erythema on anterior right thigh, erythema near demarcations, no fluctuance. Tender, but reportedly improved.  Assessment & Plan: Principal Problem:   Cellulitis of right thigh Active Problems:   Cirrhosis of liver without ascites (HCC)   Unspecified hypothyroidism   Hyperlipidemia   GERD (gastroesophageal reflux disease)   Uncontrolled type 2 diabetes mellitus with hyperglycemia, without long-term current use of insulin (HCC)   Essential hypertension   Obesity, Class III, BMI 40-49.9 (morbid obesity) (HCC)  Right thigh wound infection, cellulitis: Failed outpatient oral antibiotic therapy, admitted for IV antibiotics. Comorbidities increase risk of treatment failure. U/S without abscess, consistent with exam. - Continue broad IV antimicrobial coverage.  - Monitor blood cultures - If improvement continues, would be candidate for discharge in next 24 hours.   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 She is ambulating well, encouraged to do so frequently to lower VTE risk.  - Given downtrend of cell lines, will recheck in AM to confirm stability.  - Routine follow up with [facility].   HTN:  - Restart nadolol and diuretics, monitor for the possibility to restart home norvasc as well  Obesity: Body mass index is 42.91 kg/m.  - Continue mounjaro as outpatient  HLD:  - Outpatient follow up  GERD:  - Continue PPI  Hypothyroidism:  - Continue synthroid   Candice Nine, MD [facility] [email or web address] 04/27/2023, 12:25 PM

## 2023-04-28 DIAGNOSIS — L03115 Cellulitis of right lower limb: Secondary | ICD-10-CM | POA: Diagnosis not present

## 2023-04-28 LAB — CBC
HCT: 32.1 % — ABNORMAL LOW (ref 36.0–46.0)
Hemoglobin: 10.5 g/dL — ABNORMAL LOW (ref 12.0–15.0)
MCH: 34.7 pg — ABNORMAL HIGH (ref 26.0–34.0)
MCHC: 32.7 g/dL (ref 30.0–36.0)
MCV: 105.9 fL — ABNORMAL HIGH (ref 80.0–100.0)
Platelets: 53 10*3/uL — ABNORMAL LOW (ref 150–400)
RBC: 3.03 MIL/uL — ABNORMAL LOW (ref 3.87–5.11)
RDW: 14.4 % (ref 11.5–15.5)
WBC: 2.5 10*3/uL — ABNORMAL LOW (ref 4.0–10.5)
nRBC: 0 % (ref 0.0–0.2)

## 2023-04-28 LAB — GLUCOSE, CAPILLARY
Glucose-Capillary: 130 mg/dL — ABNORMAL HIGH (ref 70–99)
Glucose-Capillary: 136 mg/dL — ABNORMAL HIGH (ref 70–99)

## 2023-04-28 LAB — HIV ANTIBODY (ROUTINE TESTING W REFLEX): HIV Screen 4th Generation wRfx: NONREACTIVE

## 2023-04-28 LAB — HEMOGLOBIN A1C
Hgb A1c MFr Bld: 4.5 % — ABNORMAL LOW (ref 4.8–5.6)
Mean Plasma Glucose: 82.45 mg/dL

## 2023-04-28 MED ORDER — LINEZOLID 600 MG PO TABS: 600.0000 mg | ORAL_TABLET | Freq: Two times a day (BID) | ORAL | 0 refills | Status: AC

## 2023-04-28 MED ORDER — TYLENOL EXTRA STRENGTH 500 MG PO TABS: 500.0000 mg | ORAL_TABLET | Freq: Four times a day (QID) | ORAL | 0 refills | Status: AC | PRN

## 2023-04-28 MED ORDER — AMOXICILLIN-POT CLAVULANATE 875-125 MG PO TABS: 1.0000 | ORAL_TABLET | Freq: Two times a day (BID) | ORAL | 0 refills | Status: AC

## 2023-04-28 MED ORDER — AMLODIPINE BESYLATE 2.5 MG PO TABS: 2.5000 mg | ORAL_TABLET | Freq: Every day | ORAL | 1 refills | Status: DC

## 2023-04-28 NOTE — Discharge Summary (Signed)
Physician Discharge Summary  Patient ID: Candice Stanley MRN: [id number] DOB/AGE: 1965-12-14 57 y.o.  Admit date: 04/26/2023 Discharge date: 04/28/2023  Admission Diagnoses:  Discharge Diagnoses:  Principal Problem:   Cellulitis of right thigh Active Problems:   Unspecified hypothyroidism   Hyperlipidemia   GERD (gastroesophageal reflux disease)   Uncontrolled type 2 diabetes mellitus with hyperglycemia, without long-term current use of insulin (HCC)   Essential hypertension   Obesity, Class III, BMI 40-49.9 (morbid obesity) (HCC)   Cirrhosis of liver without ascites (HCC)   Discharged Condition: stable  Hospital Course:  Patient is 57 year old female with history of NASH cirrhosis, type 2 diabetes mellitus, hypertension, hyperlipidemia and obesity amongst other medical problems.  Patient presented to the ED on 04/26/2023 on the advice of orthopedic surgery due to progressive worsening of right thigh cellulitis despite taking oral antibiotics. Patient was admitted for cellulitis after failing outpatient management.  Patient was admitted and treated with I.V. Cefepime and Vancomycin.  No culture results visualized.  Patient is eager to be discharged back home.  Patient will be discharged on oral antibiotics.     Right thigh wound infection, cellulitis:  -Failed outpatient oral antibiotic therapy. -Admitted for IV antibiotics.  -Co-morbidities noted.  -U/S without abscess, consistent with exam. -Right wound infection and cellulitis have improved significantly.  -Patient is eager to be discharged back home. -Patient will be discharged on oral antibiotics.  -Patient will follow up with PCP and Orthopedic team on discharge.   Type 2 diabetes mellitus:  - Continue to optimize.     Hepatic cirrhosis: -Thought to be due to NASH -Thrombocytopenia/pancytopenia, hyperbilirubinemia, hypoalbuminemia, portal hypertension noted. -Grade 1 EVs on EGD March 2024, coagulopathy:   -Continue  routine lasix 40 mg daily and spironolactone 100 mg daily and nadolol 20 mg daily -Follow up with PCP and GI ([facility]) on discharge.   Hypertension:  - Restart nadolol and diuretics. -Decrease Norvasc to 2.5 mg po once daily on discharge.   Obesity:  -Body mass index is 42.91 kg/m.  -Diet and exercise -Continue mounjaro as outpatient   Hyperlipidemia:  - Outpatient follow up   GERD:  - Continue PPI   Hypothyroidism:  - Continue synthroid   Consults: None  Significant Diagnostic Studies:  RIGHT FEMUR 1 VIEW   COMPARISON:  None Available.   FINDINGS: There is no evidence of fracture or other focal bone lesions. Soft tissues are unremarkable.   IMPRESSION: Negative.   Discharge Exam: Blood pressure (!) 105/58, pulse (!) 58, temperature 97.8 F (36.6 C), temperature source Oral, resp. rate 18, height 5\' 4"  (1.626 m), weight 113.4 kg, SpO2 100%.   Disposition: Discharge disposition: 01-Home or Self Care.   Discharge Instructions     Diet - low sodium heart healthy   Complete by: As directed    Discharge wound care:   Complete by: As directed    Continue current wound care plan.   Increase activity slowly   Complete by: As directed       Allergies as of 04/28/2023       Reactions   Hydromorphone Nausea And Vomiting, Other (See Comments)   B/P dropped also   Vicodin [hydrocodone-acetaminophen] Nausea And Vomiting, Other (See Comments)   "Made me deathly sick"   Codeine Nausea And Vomiting   Hydrocodone Nausea And Vomiting, Other (See Comments)   "Made me deathly sick"   Onion Other (See Comments)   Headaches   Tape Swelling, Other (See Comments)   Rash(paper tape)  Latex Rash        Medication List     STOP taking these medications    albuterol 108 (90 Base) MCG/ACT inhaler Commonly known as: VENTOLIN HFA   cephALEXin 500 MG capsule Commonly known as: KEFLEX   doxycycline 100 MG capsule Commonly known as: MONODOX   ELDERBERRY  PO   fluticasone 110 MCG/ACT inhaler Commonly known as: Flovent HFA   hydrOXYzine 25 MG tablet Commonly known as: ATARAX   meclizine 25 MG tablet Commonly known as: ANTIVERT   Milk Thistle 1000 MG Caps   ondansetron 4 MG tablet Commonly known as: ZOFRAN       TAKE these medications    amLODipine 2.5 MG tablet Commonly known as: NORVASC Take 1 tablet (2.5 mg total) by mouth daily. What changed:  medication strength how much to take   amoxicillin-clavulanate 875-125 MG tablet Commonly known as: AUGMENTIN Take 1 tablet by mouth 2 (two) times daily for 7 days.   ascorbic acid 500 MG tablet Commonly known as: VITAMIN C Take 500 mg by mouth daily.   calcium carbonate 500 MG chewable tablet Commonly known as: TUMS - dosed in mg elemental calcium Chew 2 tablets by mouth daily as needed for indigestion or heartburn.   FLUoxetine 40 MG capsule Commonly known as: PROZAC Take 40 mg by mouth daily.   furosemide 40 MG tablet Commonly known as: Lasix Take 1 tablet (40 mg total) by mouth daily.   lactulose 10 GM/15ML solution Commonly known as: CHRONULAC Take 30 mLs (20 g total) by mouth daily as needed for mild constipation. Goal of 2-3 soft bowel movements daily.   linezolid 600 MG tablet Commonly known as: Zyvox Take 1 tablet (600 mg total) by mouth 2 (two) times daily for 7 days.   loratadine 10 MG tablet Commonly known as: CLARITIN Take 10 mg by mouth daily.   metFORMIN 500 MG tablet Commonly known as: GLUCOPHAGE Take 500 mg by mouth 2 (two) times daily with a meal.   Mounjaro 10 MG/0.5ML Pen Generic drug: tirzepatide Inject 10 mg into the skin once a week.   nadolol 20 MG tablet Commonly known as: CORGARD Take 1 tablet (20 mg total) by mouth daily.   pantoprazole 40 MG tablet Commonly known as: PROTONIX TAKE 1 TABLET BY MOUTH TWICE DAILY BEFORE A MEAL   spironolactone 100 MG tablet Commonly known as: Aldactone Take 1 tablet (100 mg total) by mouth  daily.   TYLENOL 500 MG tablet Generic drug: acetaminophen Take 1 tablet (500 mg total) by mouth every 6 (six) hours as needed for mild pain (pain score 1-3) or headache. What changed: how much to take   Vitamin D3 50 MCG (2000 UT) Tabs Take 2,000 Units by mouth daily.   zinc gluconate 50 MG tablet Take 50 mg by mouth daily.               Discharge Care Instructions  (From admission, onward)           Start     Ordered   04/28/23 0000  Discharge wound care:       Comments: Continue current wound care plan.   04/28/23 1417            Time spent: 35 Minutes.  SignedBarnetta Chapel 04/28/2023, 2:17 PM

## 2023-04-28 NOTE — Plan of Care (Signed)
  Problem: Education: Goal: Ability to describe self-care measures that may prevent or decrease complications (Diabetes Survival Skills Education) will improve Outcome: Progressing Goal: Individualized Educational Video(s) Outcome: Progressing   Problem: Coping: Goal: Ability to adjust to condition or change in health will improve Outcome: Progressing   Problem: Fluid Volume: Goal: Ability to maintain a balanced intake and output will improve Outcome: Progressing   Problem: Health Behavior/Discharge Planning: Goal: Ability to identify and utilize available resources and services will improve Outcome: Progressing Goal: Ability to manage health-related needs will improve Outcome: Progressing   Problem: Metabolic: Goal: Ability to maintain appropriate glucose levels will improve Outcome: Progressing   Problem: Nutritional: Goal: Maintenance of adequate nutrition will improve Outcome: Progressing Goal: Progress toward achieving an optimal weight will improve Outcome: Progressing   Problem: Tissue Perfusion: Goal: Adequacy of tissue perfusion will improve Outcome: Progressing   Problem: Education: Goal: Knowledge of General Education information will improve Description: Including pain rating scale, medication(s)/side effects and non-pharmacologic comfort measures Outcome: Progressing

## 2023-04-28 NOTE — TOC Transition Note (Signed)
Transition of Care [facility]) - CM/SW Discharge Note   Patient Details  Name: Candice Stanley MRN: [id number] Date of Birth: Nov 06, 1965  Transition of Care [facility]) CM/SW Contact:  Harriett Sine, RN Phone Number: 04/28/2023, 3:31 PM   Clinical Narrative:     Pt from home, dc home with self care. Pt encouraged to schedule follow-up with pcp. Transportation arranged by pt.  Final next level of care: Home/Self Care Barriers to Discharge: No Barriers Identified   Patient Goals and CMS Choice CMS Medicare.gov Compare Post Acute Care list provided to::  (NA) Choice offered to / list presented to : NA  Discharge Placement                    Name of family member notified: Pt notified family Patient and family notified of of transfer: 04/28/23  Discharge Plan and Services Additional resources added to the After Visit Summary for                  DME Arranged:  (NA)         HH Arranged:  (NA)          Social Determinants of Health (SDOH) Interventions SDOH Screenings   Food Insecurity: No Food Insecurity (04/26/2023)  Housing: Low Risk  (04/26/2023)  Transportation Needs: No Transportation Needs (04/26/2023)  Utilities: Not At Risk (04/26/2023)  Tobacco Use: Medium Risk (04/26/2023)     Readmission Risk Interventions    09/20/2021   12:37 PM  Readmission Risk Prevention Plan  Transportation Screening Complete  HRI or Home Care Consult Complete  Palliative Care Screening Not Applicable  Medication Review (RN Care Manager) Complete

## 2023-04-28 NOTE — Plan of Care (Signed)
Patient verbalized understanding of discharge instructions. No  signs or symptoms of acute  distress at the time of discharge. Problem: Education: Goal: Ability to describe self-care measures that may prevent or decrease complications (Diabetes Survival Skills Education) will improve Outcome: Adequate for Discharge Goal: Individualized Educational Video(s) Outcome: Adequate for Discharge   Problem: Coping: Goal: Ability to adjust to condition or change in health will improve Outcome: Adequate for Discharge   Problem: Fluid Volume: Goal: Ability to maintain a balanced intake and output will improve Outcome: Adequate for Discharge   Problem: Health Behavior/Discharge Planning: Goal: Ability to identify and utilize available resources and services will improve Outcome: Adequate for Discharge Goal: Ability to manage health-related needs will improve Outcome: Adequate for Discharge   Problem: Metabolic: Goal: Ability to maintain appropriate glucose levels will improve Outcome: Adequate for Discharge   Problem: Nutritional: Goal: Maintenance of adequate nutrition will improve Outcome: Adequate for Discharge Goal: Progress toward achieving an optimal weight will improve Outcome: Adequate for Discharge   Problem: Skin Integrity: Goal: Risk for impaired skin integrity will decrease Outcome: Adequate for Discharge   Problem: Tissue Perfusion: Goal: Adequacy of tissue perfusion will improve Outcome: Adequate for Discharge   Problem: Education: Goal: Knowledge of General Education information will improve Description: Including pain rating scale, medication(s)/side effects and non-pharmacologic comfort measures Outcome: Adequate for Discharge   Problem: Health Behavior/Discharge Planning: Goal: Ability to manage health-related needs will improve Outcome: Adequate for Discharge   Problem: Clinical Measurements: Goal: Ability to maintain clinical measurements within normal limits  will improve Outcome: Adequate for Discharge Goal: Will remain free from infection Outcome: Adequate for Discharge Goal: Diagnostic test results will improve Outcome: Adequate for Discharge Goal: Respiratory complications will improve Outcome: Adequate for Discharge Goal: Cardiovascular complication will be avoided Outcome: Adequate for Discharge   Problem: Activity: Goal: Risk for activity intolerance will decrease Outcome: Adequate for Discharge   Problem: Nutrition: Goal: Adequate nutrition will be maintained Outcome: Adequate for Discharge   Problem: Coping: Goal: Level of anxiety will decrease Outcome: Adequate for Discharge   Problem: Elimination: Goal: Will not experience complications related to bowel motility Outcome: Adequate for Discharge Goal: Will not experience complications related to urinary retention Outcome: Adequate for Discharge   Problem: Pain Managment: Goal: General experience of comfort will improve Outcome: Adequate for Discharge   Problem: Safety: Goal: Ability to remain free from injury will improve Outcome: Adequate for Discharge   Problem: Skin Integrity: Goal: Risk for impaired skin integrity will decrease Outcome: Adequate for Discharge   Problem: Education: Goal: Understanding of post-operative needs will improve Outcome: Adequate for Discharge Goal: Individualized Educational Video(s) Outcome: Adequate for Discharge   Problem: Clinical Measurements: Goal: Postoperative complications will be avoided or minimized Outcome: Adequate for Discharge   Problem: Respiratory: Goal: Will regain and/or maintain adequate ventilation Outcome: Adequate for Discharge

## 2023-05-09 ENCOUNTER — Emergency Department (HOSPITAL_COMMUNITY)
Admission: EM | Admit: 2023-05-09 | Discharge: 2023-05-09 | Disposition: A | Discharge status: Home / Self Care | Payer: Medicaid Other | Attending: Emergency Medicine | Admitting: Emergency Medicine

## 2023-05-09 ENCOUNTER — Encounter (HOSPITAL_COMMUNITY): Payer: Self-pay | Admitting: *Deleted

## 2023-05-09 ENCOUNTER — Other Ambulatory Visit: Payer: Self-pay

## 2023-05-09 DIAGNOSIS — R002 Palpitations: Secondary | ICD-10-CM | POA: Insufficient documentation

## 2023-05-09 DIAGNOSIS — Z91040 Latex allergy status: Secondary | ICD-10-CM | POA: Insufficient documentation

## 2023-05-09 LAB — COMPREHENSIVE METABOLIC PANEL
ALT: 20 U/L (ref 0–44)
AST: 36 U/L (ref 15–41)
Albumin: 3.1 g/dL — ABNORMAL LOW (ref 3.5–5.0)
Alkaline Phosphatase: 83 U/L (ref 38–126)
Anion gap: 9 (ref 5–15)
BUN: 14 mg/dL (ref 6–20)
CO2: 28 mmol/L (ref 22–32)
Calcium: 10.3 mg/dL (ref 8.9–10.3)
Chloride: 101 mmol/L (ref 98–111)
Creatinine, Ser: 0.62 mg/dL (ref 0.44–1.00)
GFR, Estimated: >60 mL/min (ref >60)
Glucose, Bld: 147 mg/dL — ABNORMAL HIGH (ref 70–99)
Potassium: 4.4 mmol/L (ref 3.5–5.1)
Sodium: 138 mmol/L (ref 135–145)
Total Bilirubin: 4.2 mg/dL — ABNORMAL HIGH (ref 0.3–1.2)
Total Protein: 5.8 g/dL — ABNORMAL LOW (ref 6.5–8.1)

## 2023-05-09 LAB — CBC WITH DIFFERENTIAL/PLATELET
Basophils Absolute: 0 10*3/uL (ref 0.0–0.1)
Basophils Relative: 1 %
Eosinophils Absolute: 0 10*3/uL (ref 0.0–0.5)
Eosinophils Relative: 0 %
HCT: 32 % — ABNORMAL LOW (ref 36.0–46.0)
Hemoglobin: 10.5 g/dL — ABNORMAL LOW (ref 12.0–15.0)
Lymphocytes Relative: 23 %
Lymphs Abs: 0.6 10*3/uL — ABNORMAL LOW (ref 0.7–4.0)
MCH: 34.5 pg — ABNORMAL HIGH (ref 26.0–34.0)
MCHC: 32.8 g/dL (ref 30.0–36.0)
MCV: 105.3 fL — ABNORMAL HIGH (ref 80.0–100.0)
Monocytes Absolute: 0.1 10*3/uL (ref 0.1–1.0)
Monocytes Relative: 2 %
Neutro Abs: 1.9 10*3/uL (ref 1.7–7.7)
Neutrophils Relative %: 74 %
Platelets: 42 10*3/uL — ABNORMAL LOW (ref 150–400)
RBC: 3.04 MIL/uL — ABNORMAL LOW (ref 3.87–5.11)
RDW: 13.2 % (ref 11.5–15.5)
WBC: 2.6 10*3/uL — ABNORMAL LOW (ref 4.0–10.5)
nRBC: 0 % (ref 0.0–0.2)

## 2023-05-09 MED ORDER — PANTOPRAZOLE SODIUM 40 MG IV SOLR
40.0000 mg | Freq: Once | INTRAVENOUS | Status: AC
  Administered 2023-05-09: 40 mg via INTRAVENOUS
  Filled 2023-05-09: qty 10

## 2023-05-09 MED ORDER — METOCLOPRAMIDE HCL 5 MG/ML IJ SOLN
10.0000 mg | Freq: Once | INTRAMUSCULAR | Status: AC
  Administered 2023-05-09: 10 mg via INTRAVENOUS
  Filled 2023-05-09: qty 2

## 2023-05-09 MED ORDER — SODIUM CHLORIDE 0.9 % IV BOLUS
1000.0000 mL | Freq: Once | INTRAVENOUS | Status: AC
  Administered 2023-05-09: 1000 mL via INTRAVENOUS

## 2023-05-09 NOTE — ED Provider Notes (Incomplete)
[place] EMERGENCY DEPARTMENT AT [facility] Provider Note   CSN: [id number] Arrival date & time: 05/09/23  4034     History {Add pertinent medical, surgical, social history, OB history to HPI:1} Chief Complaint  Patient presents with   Chest Palpitations    Candice Stanley is a 57 y.o. female.  Patient has a history of cirrhosis.  Patient complains of palpitations.  Patient recently was off her Prozac while she was on an antibiotic.  She is going to start back the Prozac after the antibiotic finishes   Weakness      Home Medications Prior to Admission medications   Medication Sig Start Date End Date Taking? Authorizing Provider  MOUNJARO 2.5 MG/0.5ML Pen Inject into the skin. 04/28/23  Yes [provider]  amLODipine (NORVASC) 2.5 MG tablet Take 1 tablet (2.5 mg total) by mouth daily. 04/28/23 05/28/23  Berton Mount I, MD  calcium carbonate (TUMS - DOSED IN MG ELEMENTAL CALCIUM) 500 MG chewable tablet Chew 2 tablets by mouth daily as needed for indigestion or heartburn. 11/01/21   [provider]  Cholecalciferol (VITAMIN D3) 50 MCG (2000 UT) TABS Take 2,000 Units by mouth daily.    [provider]  FLUoxetine (PROZAC) 40 MG capsule Take 40 mg by mouth daily. 08/15/19   [provider]  furosemide (LASIX) 40 MG tablet Take 1 tablet (40 mg total) by mouth daily. 11/23/22   Letta Median, PA-C  lactulose (CHRONULAC) 10 GM/15ML solution Take 30 mLs (20 g total) by mouth daily as needed for mild constipation. Goal of 2-3 soft bowel movements daily. 12/08/22   Letta Median, PA-C  loratadine (CLARITIN) 10 MG tablet Take 10 mg by mouth daily.    [provider]  metFORMIN (GLUCOPHAGE) 500 MG tablet Take 500 mg by mouth 2 (two) times daily with a meal.    [provider]  MOUNJARO 10 MG/0.5ML Pen Inject 10 mg into the skin once a week. 02/28/23   [provider]  nadolol (CORGARD) 20 MG tablet Take 1  tablet (20 mg total) by mouth daily. 12/28/22   Letta Median, PA-C  pantoprazole (PROTONIX) 40 MG tablet TAKE 1 TABLET BY MOUTH TWICE DAILY BEFORE A MEAL 01/23/23   Letta Median, PA-C  spironolactone (ALDACTONE) 100 MG tablet Take 1 tablet (100 mg total) by mouth daily. 11/23/22   Letta Median, PA-C  TYLENOL 500 MG tablet Take 1 tablet (500 mg total) by mouth every 6 (six) hours as needed for mild pain (pain score 1-3) or headache. 04/28/23   Barnetta Chapel, MD  vitamin C (ASCORBIC ACID) 500 MG tablet Take 500 mg by mouth daily.     [provider]  zinc gluconate 50 MG tablet Take 50 mg by mouth daily.    [provider]      Allergies    Hydromorphone, Vicodin [hydrocodone-acetaminophen], Codeine, Hydrocodone, Onion, Tape, and Latex    Review of Systems   Review of Systems  Neurological:  Positive for weakness.    Physical Exam Updated Vital Signs BP 134/70 (BP Location: Left Arm)   Pulse 90   Temp 98 F (36.7 C) (Oral)   Resp 20   Ht 5\' 4"  (1.626 m)   Wt 113.4 kg   SpO2 96%   BMI 42.91 kg/m  Physical Exam  ED Results / Procedures / Treatments   Labs (all labs ordered are listed, but only abnormal results are displayed) Labs Reviewed  CBC WITH DIFFERENTIAL/PLATELET - Abnormal; Notable for the following components:      Result Value   WBC 2.6 (*)    RBC 3.04 (*)    Hemoglobin 10.5 (*)    HCT 32.0 (*)    MCV 105.3 (*)    MCH 34.5 (*)    Platelets 42 (*)    Lymphs Abs 0.6 (*)    All other components within normal limits  COMPREHENSIVE METABOLIC PANEL - Abnormal; Notable for the following components:   Glucose, Bld 147 (*)    Total Protein 5.8 (*)    Albumin 3.1 (*)    Total Bilirubin 4.2 (*)    All other components within normal limits    EKG EKG Interpretation Date/Time:  Tuesday May 09 2023 07:50:19 EDT Ventricular Rate:  84 PR Interval:  140 QRS Duration:  89 QT Interval:  371 QTC Calculation: 439 R  Axis:   94  Text Interpretation: Sinus rhythm Borderline right axis deviation Low voltage, precordial leads Borderline T abnormalities, anterior leads Confirmed by Bethann Berkshire [phone]) on 05/09/2023 8:08:37 AM  Radiology No results found.  Procedures Procedures  {Document cardiac monitor, telemetry assessment procedure when appropriate:1}  Medications Ordered in ED Medications  metoCLOPramide (REGLAN) injection 10 mg (10 mg Intravenous Given 05/09/23 0901)  sodium chloride 0.9 % bolus 1,000 mL (0 mLs Intravenous Stopped 05/09/23 1040)  pantoprazole (PROTONIX) injection 40 mg (40 mg Intravenous Given 05/09/23 0900)    ED Course/ Medical Decision Making/ A&P   {   Click here for ABCD2, HEART and other calculatorsREFRESH Note before signing :1}                              Medical Decision Making Amount and/or Complexity of Data Reviewed Labs: ordered. ECG/medicine tests: ordered.  Risk Prescription drug management.   Patient with palpitations but normal sinus rhythm on EKG.  She will start back her Prozac  {Document critical care time when appropriate:1} {Document review of labs and clinical decision tools ie heart score, Chads2Vasc2 etc:1}  {Document your independent review of radiology images, and any outside records:1} {Document your discussion with family members, caretakers, and with consultants:1} {Document social determinants of health affecting pt's care:1} {Document your decision making why or why not admission, treatments were needed:1} Final Clinical Impression(s) / ED Diagnoses Final diagnoses:  Palpitations    Rx / DC Orders ED Discharge Orders     None

## 2023-05-09 NOTE — ED Triage Notes (Signed)
Pt c/o in c/o that yesterday she woke up nauseated, had a headache, and felt like her heart was racing. Her HR was 80 yesterday but her "monitor" said it was abnormal. Today she reports she still feels the same way. She took Zofran at 0600 this morning.

## 2023-05-09 NOTE — Discharge Instructions (Signed)
Start back taking your Prozac and follow-up with your family doctor next week

## 2023-05-30 ENCOUNTER — Other Ambulatory Visit (HOSPITAL_COMMUNITY)
Admission: RE | Admit: 2023-05-30 | Discharge: 2023-05-30 | Disposition: A | Discharge status: Home / Self Care | Payer: Medicaid Other | Source: Ambulatory Visit | Attending: Gastroenterology | Admitting: Gastroenterology

## 2023-05-30 ENCOUNTER — Ambulatory Visit (HOSPITAL_COMMUNITY)
Admission: RE | Admit: 2023-05-30 | Discharge: 2023-05-30 | Disposition: A | Discharge status: Home / Self Care | Payer: Medicaid Other | Source: Ambulatory Visit | Attending: Gastroenterology | Admitting: Gastroenterology

## 2023-05-30 ENCOUNTER — Encounter: Payer: Self-pay | Admitting: Gastroenterology

## 2023-05-30 ENCOUNTER — Ambulatory Visit (INDEPENDENT_AMBULATORY_CARE_PROVIDER_SITE_OTHER): Payer: Medicaid Other | Admitting: Gastroenterology

## 2023-05-30 VITALS — BP 150/92 | HR 106 | Temp 98.4°F | Ht 64.0 in | Wt 275.0 lb

## 2023-05-30 DIAGNOSIS — R188 Other ascites: Secondary | ICD-10-CM | POA: Insufficient documentation

## 2023-05-30 DIAGNOSIS — D696 Thrombocytopenia, unspecified: Secondary | ICD-10-CM | POA: Insufficient documentation

## 2023-05-30 DIAGNOSIS — R635 Abnormal weight gain: Secondary | ICD-10-CM

## 2023-05-30 DIAGNOSIS — K3189 Other diseases of stomach and duodenum: Secondary | ICD-10-CM

## 2023-05-30 DIAGNOSIS — K766 Portal hypertension: Secondary | ICD-10-CM

## 2023-05-30 DIAGNOSIS — K219 Gastro-esophageal reflux disease without esophagitis: Secondary | ICD-10-CM | POA: Insufficient documentation

## 2023-05-30 DIAGNOSIS — R140 Abdominal distension (gaseous): Secondary | ICD-10-CM | POA: Insufficient documentation

## 2023-05-30 DIAGNOSIS — K7460 Unspecified cirrhosis of liver: Secondary | ICD-10-CM | POA: Insufficient documentation

## 2023-05-30 DIAGNOSIS — I8500 Esophageal varices without bleeding: Secondary | ICD-10-CM | POA: Diagnosis present

## 2023-05-30 DIAGNOSIS — K7581 Nonalcoholic steatohepatitis (NASH): Secondary | ICD-10-CM

## 2023-05-30 LAB — BASIC METABOLIC PANEL
Anion gap: 8 (ref 5–15)
BUN: 7 mg/dL (ref 6–20)
CO2: 20 mmol/L — ABNORMAL LOW (ref 22–32)
Calcium: 8.8 mg/dL — ABNORMAL LOW (ref 8.9–10.3)
Chloride: 108 mmol/L (ref 98–111)
Creatinine, Ser: 0.6 mg/dL (ref 0.44–1.00)
GFR, Estimated: >60 mL/min (ref >60)
Glucose, Bld: 154 mg/dL — ABNORMAL HIGH (ref 70–99)
Potassium: 4.2 mmol/L (ref 3.5–5.1)
Sodium: 136 mmol/L (ref 135–145)

## 2023-05-30 LAB — CBC WITH DIFFERENTIAL/PLATELET
Abs Immature Granulocytes: 0.01 10*3/uL (ref 0.00–0.07)
Basophils Absolute: 0 10*3/uL (ref 0.0–0.1)
Basophils Relative: 1 %
Eosinophils Absolute: 0 10*3/uL (ref 0.0–0.5)
Eosinophils Relative: 1 %
HCT: 35.4 % — ABNORMAL LOW (ref 36.0–46.0)
Hemoglobin: 11.2 g/dL — ABNORMAL LOW (ref 12.0–15.0)
Immature Granulocytes: 0 %
Lymphocytes Relative: 20 %
Lymphs Abs: 0.7 10*3/uL (ref 0.7–4.0)
MCH: 32.9 pg (ref 26.0–34.0)
MCHC: 31.6 g/dL (ref 30.0–36.0)
MCV: 104.1 fL — ABNORMAL HIGH (ref 80.0–100.0)
Monocytes Absolute: 0.3 10*3/uL (ref 0.1–1.0)
Monocytes Relative: 7 %
Neutro Abs: 2.5 10*3/uL (ref 1.7–7.7)
Neutrophils Relative %: 71 %
Platelets: 58 10*3/uL — ABNORMAL LOW (ref 150–400)
RBC: 3.4 MIL/uL — ABNORMAL LOW (ref 3.87–5.11)
RDW: 15 % (ref 11.5–15.5)
WBC: 3.5 10*3/uL — ABNORMAL LOW (ref 4.0–10.5)
nRBC: 0 % (ref 0.0–0.2)

## 2023-05-30 LAB — MAGNESIUM: Magnesium: 1.7 mg/dL (ref 1.7–2.4)

## 2023-05-30 LAB — HEPATIC FUNCTION PANEL
ALT: 38 U/L (ref 0–44)
AST: 59 U/L — ABNORMAL HIGH (ref 15–41)
Albumin: 3.2 g/dL — ABNORMAL LOW (ref 3.5–5.0)
Alkaline Phosphatase: 116 U/L (ref 38–126)
Bilirubin, Direct: 1.2 mg/dL — ABNORMAL HIGH (ref 0.0–0.2)
Indirect Bilirubin: 3 mg/dL — ABNORMAL HIGH (ref 0.3–0.9)
Total Bilirubin: 4.2 mg/dL — ABNORMAL HIGH (ref <1.2)
Total Protein: 5.9 g/dL — ABNORMAL LOW (ref 6.5–8.1)

## 2023-05-30 LAB — PROTIME-INR
INR: 1.6 — ABNORMAL HIGH (ref 0.8–1.2)
Prothrombin Time: 19 s — ABNORMAL HIGH (ref 11.4–15.2)

## 2023-05-30 NOTE — Progress Notes (Signed)
GI Office Note    Referring Provider: Benita Stabile, MD Primary Care Physician:  Benita Stabile, MD  Primary Gastroenterologist: Hennie Duos. Marletta Lor, DO   Chief Complaint   Chief Complaint  Patient presents with   Bloated    Pt complains of abd hardening X yesterday and hard to breath since about 3 this am. Pt hasn't had BP meds since Oct 17th or 18th because her BP has been running low    History of Present Illness   Candice Stanley is a 57 y.o. female presenting today walking into the office requesting an appointment for abdominal pain.  She has a history of Elita Boone cirrhosis with thrombocytopenia and esophageal varices, GERD, constipation.  She was last seen in June of this year.  Recent admission, discharged on October 18 after treatment for cellulitis of the right thigh.  Recent ED visit October 29 for palpitations, headache, nausea.  EKG was normal sinus rhythm.  Labs with glucose of 147, total bilirubin 4.2 LFTs normal, white blood cell count 2600, hemoglobin 10.5, MCV 105.3, platelets 42,000. Possibly secondary to Prozac being held in setting of antibiotic use and concern for drug drug interaction.  Limited ultrasound in May 2024, small volume ascites.  Abdominal ultrasound March 2024 with cirrhotic liver with splenomegaly and mild ascites, no liver mass.  Today: a lot of leg cramps. Drinking pickle juice, knows a lot of salt. Tried mustard before but gave her heartburn. In the last couple of weeks, a whole jar worth of pickle juice. Stomach swollen all of a sudden, feels like it has happened in the past few days. Weight up 20 pounds. Can't even sit up straight. Some nausea. But worse after food trigger, spicy/fried. This morning nauseated when got up at 3am. Finds it hard to breath due to distention. Just uncomfortable but no pain. Up since 3am this morning. DOE, fatigue with going to mailbox. No significant lower extremity edema. BM fairly regular. Was having diarrhea on antibiotics.  No melena, brbpr.   EGD March 2024: -Grade 1 esophageal varices -Portal hypertensive gastropathy  Colonoscopy March 2024: -Nonbleeding internal hemorrhoids -Diverticulosis in the sigmoid colon -two to 5 mm polyps in the sigmoid colon removed, tubular adenoma -Stool in the entire examined colon -Next colonoscopy in 5 years  Wt Readings from Last 10 Encounters:  05/30/23 275 lb (124.7 kg)  05/09/23 250 lb (113.4 kg)  04/26/23 250 lb (113.4 kg)  04/01/23 255 lb (115.7 kg)  03/09/23 256 lb (116.1 kg)  12/28/22 255 lb (115.7 kg)  11/23/22 285 lb (129.3 kg)  09/21/22 (P) 285 lb 6.4 oz (129.5 kg)  09/05/22 285 lb 6.4 oz (129.5 kg)  08/29/22 291 lb (132 kg)     Medications   Current Outpatient Medications  Medication Sig Dispense Refill   calcium carbonate (TUMS - DOSED IN MG ELEMENTAL CALCIUM) 500 MG chewable tablet Chew 2 tablets by mouth daily as needed for indigestion or heartburn.     Cholecalciferol (VITAMIN D3) 50 MCG (2000 UT) TABS Take 2,000 Units by mouth daily.     FLUoxetine (PROZAC) 40 MG capsule Take 40 mg by mouth daily.     furosemide (LASIX) 40 MG tablet Take 1 tablet (40 mg total) by mouth daily. 30 tablet 3   loratadine (CLARITIN) 10 MG tablet Take 10 mg by mouth daily.     metFORMIN (GLUCOPHAGE) 500 MG tablet Take 500 mg by mouth 2 (two) times daily with a meal.  MOUNJARO 10 MG/0.5ML Pen Inject 10 mg into the skin once a week.     nadolol (CORGARD) 20 MG tablet Take 1 tablet (20 mg total) by mouth daily. 30 tablet 3   pantoprazole (PROTONIX) 40 MG tablet TAKE 1 TABLET BY MOUTH TWICE DAILY BEFORE A MEAL 60 tablet 5   spironolactone (ALDACTONE) 100 MG tablet Take 1 tablet (100 mg total) by mouth daily. 30 tablet 3   TYLENOL 500 MG tablet Take 1 tablet (500 mg total) by mouth every 6 (six) hours as needed for mild pain (pain score 1-3) or headache. 30 tablet 0   vitamin C (ASCORBIC ACID) 500 MG tablet Take 500 mg by mouth daily.      zinc gluconate 50 MG tablet  Take 50 mg by mouth daily.     amLODipine (NORVASC) 2.5 MG tablet Take 1 tablet (2.5 mg total) by mouth daily. 30 tablet 1   lactulose (CHRONULAC) 10 GM/15ML solution Take 30 mLs (20 g total) by mouth daily as needed for mild constipation. Goal of 2-3 soft bowel movements daily. (Patient not taking: Reported on 05/30/2023) 946 mL 3   No current facility-administered medications for this visit.    Allergies   Allergies as of 05/30/2023 - Review Complete 05/30/2023  Allergen Reaction Noted   Hydromorphone Nausea And Vomiting and Other (See Comments) 04/26/2023   Vicodin [hydrocodone-acetaminophen] Nausea And Vomiting and Other (See Comments) 03/05/2012   Codeine Nausea And Vomiting 11/03/2009   Hydrocodone Nausea And Vomiting and Other (See Comments) 04/26/2023   Onion Other (See Comments) 04/26/2023   Tape Swelling and Other (See Comments) 02/21/2015   Latex Rash 12/06/2016         Review of Systems   General: Negative for anorexia, weight loss, fever, chills, fatigue, weakness. ENT: Negative for hoarseness, difficulty swallowing , nasal congestion. CV: Negative for chest pain, angina, palpitations, dyspnea on exertion, peripheral edema.  Respiratory: Negative for dyspnea at rest, dyspnea on exertion, cough, sputum, wheezing.  GI: See history of present illness. GU:  Negative for dysuria, hematuria, urinary incontinence, urinary frequency, nocturnal urination.  Endo: Negative for unusual weight change.     Physical Exam   BP (!) 150/92   Pulse (!) 106   Temp 98.4 F (36.9 C)   Ht 5\' 4"  (1.626 m)   Wt 275 lb (124.7 kg)   BMI 47.20 kg/m    General: Well-nourished, well-developed in no acute distress.  Eyes: No icterus. Mouth: Oropharyngeal mucosa moist and pink  Lungs: Clear to auscultation bilaterally.  Heart: Regular rate and rhythm, no murmurs rubs or gallops.  Abdomen: Bowel sounds are normal, nontender, no hepatosplenomegaly or masses,  no abdominal bruits or hernia  , no rebound or guarding. Abd distention, with more firm in upper abdomen. Some pitting of abdominal wall.  Rectal: not performed  Extremities: 1+ bilateral lower extremity edema. No clubbing or deformities. Neuro: Alert and oriented x 4   Skin: Warm and dry, no jaundice.   Psych: Alert and cooperative, normal mood and affect.  Labs   Lab Results  Component Value Date   NA 138 05/09/2023   CL 101 05/09/2023   K 4.4 05/09/2023   CO2 28 05/09/2023   BUN 14 05/09/2023   CREATININE 0.62 05/09/2023   GFRNONAA >60 05/09/2023   CALCIUM 10.3 05/09/2023   PHOS 2.5 09/18/2021   ALBUMIN 3.1 (L) 05/09/2023   GLUCOSE 147 (H) 05/09/2023   Lab Results  Component Value Date   ALT 20 05/09/2023  AST 36 05/09/2023   ALKPHOS 83 05/09/2023   BILITOT 4.2 (H) 05/09/2023   Lab Results  Component Value Date   WBC 2.6 (L) 05/09/2023   HGB 10.5 (L) 05/09/2023   HCT 32.0 (L) 05/09/2023   MCV 105.3 (H) 05/09/2023   PLT 42 (L) 05/09/2023   Lab Results  Component Value Date   INR 1.2 04/26/2023   INR 1.3 (H) 12/06/2022   INR 1.3 (H) 09/05/2022    Imaging Studies   No results found.  Assessment/Plan:   NASH cirrhosis: with grade 1 esophageal varices on nadolol, thrombocytopenia, portal hypertensive gastropathy. Presenting with 20 pound weight gain, abdominal distention.  -update labs -abd u/s with paracentesis if enough fluid to draw -continue current diuretic regimen and nadolol for now -reinforced 2 gram sodium diet, high protein diet, mostly plant based. Hand out provided -return ov based on labs, u/s findings  Constipation: -no issues right now. Stools more regularly, not requiring lactulose  GERD: -well controlled on pantoprazole -continue antireflux measures, dietary modifications      Leanna Battles. Melvyn Neth, MHS, PA-C [facility]

## 2023-05-30 NOTE — Patient Instructions (Addendum)
Please complete labs at [facility] lab. We will try to get fluid drawn off your abdomen if there is enough seen on ultrasound. If ultrasound does not show significant fluid, then you may need further imaging via CT scan to see why your abdomen is so distended.  Continue current medications for now. Limit sodium intake to 2 grams (2000 mg) daily.        DIET/NUTRITION FOR CIRRHOSIS NO ALCOHOL YOUR GOALS  Evening snack - high protein Supplements between meals to help meet calorie and protein goal: 1400-1800 cal/day. 100 grams protein/day. Boost Ensure Premier Protein Shakes Protein: Greek yogurt Fish, chicken (NO RAW OR UNDERCOOKED FISH/SHELLFISH) Avoid pork and red meat Plant based protein (non-soy)/Vegan: Lentils, Chickpeas, Peanuts (non salted), almonds (non salted), quinoa, chia seeds  Plant based protein supplements (not soy)  Avoid/limit animal based protein supplements: whey, casein Low sodium (2,000 mg/day) Avoid: table salt, canned foods, deli meats, sausages, hot dogs, anything with a long shelf life Read nutrition labels and be aware of serving size. Don't go by percent of daily value. Supplements: multivitamin Zinc Branch Chain Amino acids (BCAA): 12 grams/day L-Ornithine L-Aspartate (LOLA): 6 grams three times/day ([email or web address]) L-Carnitine  PHYSICAL ACTIVITY It is important to continue to be active when you have cirrhosis. Exercise will help reduce muscle loss and weakness.  Exercise Aerobic 40 minutes, 3 times/week Anything that raises the heart rate and makes it difficult to hold a prolonged conversation because of breathing  Brisk walk Swimming Biking  Swinging your arms or clapping your hands Resistance 45 minutes, 3 times/week Weights Resistance bands You can do this from a chair with little to no impact on joints (information attached)  DISCUSS REFERRAL FOR PHYSICAL THERAPY WITH YOUR PRIMARY CARE PROVIDER  10 Chair  Exercises  1  Ankle and Wrist Rolls  Sit tall on a sturdy chair, so your back is straight and is not leaning against the chair back.  Flex your fingers, opening and closing your fists several times before making fists and rolling your wrists 10 times in each direction. Perform the same exercises with your feet. First, flex and point each foot independently as you simultaneously curl and straighten your toes. One at a time, roll each ankle to the outside 10 times, then one at a time, roll each ankle to the inside 10 times.  2  Single-Leg Calf Raises  Sitting tall in a chair with feet planted flat on the floor about hip-distance apart, engage your core and look straight ahead.  Start with the right foot and lift your heel from the ground as high as you can, trying to raise up as high as you can on your toes, engaging the calf as you perform the exercise. Lower the heel back to the floor and repeat to complete a set of 10 repetitions. Repeat the movement with the left leg.  Perform three sets of 10 reps per leg.  After performing the initial sets, add two more sets of 10 repetitions, this time lifting both heels simultaneously. At the end of the last set, hold the heels lifted from the floor for 20 seconds.   3 Sit-and-Stands  The sit-and-stand is just what it sounds like. Start seated in a sturdy chair, feet planted on the floor about hip-distance apart. Using as little assistance from hands or arms as possible, engage your core, and tip forward from the hips.  Press your weight through all four corners of your feet and push yourself to stand, extending  your knees and hips fully.  Reverse the movement, pressing your hips back and bending your knees to carefully lower yourself to the seated position.  If you can't press all the way to a standing position, simply shift your weight forward and lift your glutes an inch or two from the chair seat and hold for a second before lowering back down. Over  time, work on developing the strength and balance necessary to come to a standing position.  4  Seated Hip Marches  Sit tall on a sturdy chair, your feet flat on the floor, hip-distance apart.  Grasp the edges or armrests of the chair with both hands and engage your abdominal muscles to help keep your torso tall.  LIft your right leg with your knee bent as high as you comfortably can, as though doing a high-knee march.  Lower your right foot to the floor with control. Repeat to the opposite side. Perform at least 20 alternating marches in succession. Take a break, then repeat two to three more times. This exercise can be continued for a more cardiovascular effect, or it can be incorporated into a warm-up to help raise the heart rate and get the blood flowing before performing more strength-focused movements.  5  Heel Slides  Sit tall in a sturdy chair, with knees bent and feet flat on the floor about hip-distance apart.  Extend the right leg and flex the right foot, so the heel remains in contact with the ground, but the toes are pointing up toward the ceiling.  Engage your glutes and hamstrings, using these muscle groups to drag your right heel back toward the chair while it remains in contact with the floor.  Reverse the movement and slide your heel away from you, extending your right knee. Perform 10 to 12 repetitions on one side before switching legs.  Complete two to three sets per leg.  While this exercise can be done without any special equipment, you may want to use a paper plate or a small towel to make it easier for the heel to slide across the floor.  6  Seated Shoulder Press  Use lightweight dumbbells, water bottles, canned goods, or resistance bands to perform this exercise. If you're using a resistance band, select a long, flat band and secure it in place by sitting on top of the center of the band before grasping each end to perform the exercise.  Sit tall in a sturdy chair,  your feet flat on the ground about shoulder-distance apart.  Hold a light dumbbell or the end of a resistance band in each hand at your shoulders, your elbows bent and your palms facing away from you.  Press your arms straight up overhead, extending your elbows.  Carefully lower your hands back to the starting position.  Complete two to three sets of 10 to 12 repetitions.  7  Seated Torso Twists  Sit tall, your feet flat on the ground about hip-distance apart. Make sure you don't lean back in the chair.  Place your hands lightly behind your head, your elbows bent and pointing out toward the sides of the room. Keeping your pelvis steady, exhale and twist your torso to the right as far as you comfortably can.  Inhale and return to center, keeping your hips stable.  Exhale and twist your torso to the left as far as you comfortably can.  Inhale and return to center.  Continue until you've twisted to each side between six and eight times.  Rest, then perform a second set.  8  Modified Leg Lifts  While it's best to use a sturdy chair with armrests for this move, you can also perform the exercise while gripping the edges of the chair beside your hips.  Sit tall in a chair, your core engaged, your feet together and flat on the floor. Roll your shoulders back to maintain perfect posture. Hold the chair's armrests or grip the chair's seat. Keeping your feet and knees together, lift both legs as high as you can (with knees bent) as you exhale.  Hold for five seconds, then lower your feet back to the floor.  Perform 10 to 12 repetitions and complete a total of three to five sets.  9  Modified Planks  Position the chair in front of a wall so the chair is stable and won't slide or move as you're performing the plank. You can position the chair so the seat is facing the wall, providing you with access to the back of the chair for support, or you can position the chair so the back is facing the wall,  providing you with access to the seat of the chair for support. Adults with lower levels of strength or mobility should start by using the back of the chair for support.  Once the chair is secure against the wall, place your hands on the back of the chair (or on the seat, depending on the chair's position) so your hands are shoulder-distance apart.  Engage your core and step your feet backward until your body forms a straight diagonal line from your heels to your head. Your arms should be perfectly straight, your hips should be perfectly aligned between your knees and your shoulders, and you should feel your abdominals working to keep your body steady.  Hold the position for 10 to 60 seconds before returning to standing.  Complete three sets, holding each plank for as long as you can while maintaining good form.  10  Modified Burpees  Push a sturdy chair against a wall so the back is to the wall and the chair isn't at risk of sliding or moving.  Stand facing the chair, feet roughly shoulder-distance apart.  Press your hips back and bend your knees to enter a half-squat position.  Place both hands firmly on the chair's seat, arms fully extended and palms aligned under the shoulders. Step one foot, then the other, behind you, so your body forms a straight line from heels to head in a modified chair plank position.  Reverse the movement and step each foot forward to their starting position.  Press through your feet and extend your knees and hips as you rise to standing. As you do, lift your arms over your head, clapping your hands together.  This counts as a single modified chair burpee. Perform as many as you can (aim for six to 10) with perfect form. Complete two to three sets.  Source: [employer], [employer], Proctor DN, Curley Spice MA, [place], [place], [place], Skinner JS. "[employer] Position Stand. Exercise and Physical Activity for  Older Adults." Medicine and Science in Sports and Exercise. https://watson.com/. July 2009.

## 2023-05-31 ENCOUNTER — Other Ambulatory Visit: Payer: Self-pay

## 2023-05-31 ENCOUNTER — Ambulatory Visit (HOSPITAL_COMMUNITY)
Admission: RE | Admit: 2023-05-31 | Discharge: 2023-05-31 | Disposition: A | Discharge status: Home / Self Care | Payer: Medicaid Other | Source: Ambulatory Visit | Attending: Gastroenterology | Admitting: Gastroenterology

## 2023-05-31 ENCOUNTER — Encounter (HOSPITAL_COMMUNITY): Payer: Self-pay

## 2023-05-31 VITALS — BP 132/80 | HR 78 | Temp 98.3°F | Resp 16

## 2023-05-31 DIAGNOSIS — R140 Abdominal distension (gaseous): Secondary | ICD-10-CM | POA: Insufficient documentation

## 2023-05-31 DIAGNOSIS — K7460 Unspecified cirrhosis of liver: Secondary | ICD-10-CM | POA: Diagnosis present

## 2023-05-31 DIAGNOSIS — D509 Iron deficiency anemia, unspecified: Secondary | ICD-10-CM

## 2023-05-31 DIAGNOSIS — K219 Gastro-esophageal reflux disease without esophagitis: Secondary | ICD-10-CM | POA: Diagnosis present

## 2023-05-31 DIAGNOSIS — D696 Thrombocytopenia, unspecified: Secondary | ICD-10-CM | POA: Insufficient documentation

## 2023-05-31 DIAGNOSIS — R188 Other ascites: Secondary | ICD-10-CM | POA: Diagnosis present

## 2023-05-31 DIAGNOSIS — I8500 Esophageal varices without bleeding: Secondary | ICD-10-CM | POA: Diagnosis present

## 2023-05-31 LAB — BODY FLUID CELL COUNT WITH DIFFERENTIAL
Eos, Fluid: 0 %
Lymphs, Fluid: 28 %
Monocyte-Macrophage-Serous Fluid: 22 % — ABNORMAL LOW (ref 50–90)
Neutrophil Count, Fluid: 50 % — ABNORMAL HIGH (ref 0–25)
Total Nucleated Cell Count, Fluid: 305 uL (ref 0–1000)

## 2023-05-31 LAB — GRAM STAIN: Gram Stain: NONE SEEN

## 2023-05-31 LAB — VITAMIN B12: Vitamin B-12: 1032 pg/mL — ABNORMAL HIGH (ref 180–914)

## 2023-05-31 LAB — FOLATE: Folate: 16.3 ng/mL (ref >5.9)

## 2023-05-31 LAB — AFP TUMOR MARKER: AFP, Serum, Tumor Marker: 4.8 ng/mL (ref 0.0–9.2)

## 2023-05-31 MED ORDER — LIDOCAINE HCL (PF) 2 % IJ SOLN
INTRAMUSCULAR | Status: AC
  Filled 2023-05-31: qty 10

## 2023-05-31 NOTE — Progress Notes (Signed)
Patient tolerated left sided paracentesis procedure well today and 2.8 Liters of clear yellow ascites removed with labs collected and sent for processing. Patient verbalized understanding of discharge instructions and ambulatory at departure with no acute distress noted.

## 2023-06-01 ENCOUNTER — Telehealth: Payer: Self-pay

## 2023-06-01 NOTE — Telephone Encounter (Signed)
Pt was scheduled with [facility] tomorrow.

## 2023-06-01 NOTE — Telephone Encounter (Signed)
Pt called stating that she had a para done yesterday and that ever since she had it done she has felt worse. Pt states that she woke up this morning very nauseated and feels swollen and is having abd discomfort.

## 2023-06-01 NOTE — Telephone Encounter (Signed)
Can we please have her come back in tomorrow to reassess? She primarily has seen Baxter Hire if she has opening.   Patient had decline in her liver function labs. Her paracentesis was done, no signs of SBP, she had only little over 2 liters removed. Need to get her in here and check blood pressure, vitals. We had contemplated CT scan before, need to reassess for that.   If she continues to feel worse, then go to the ER.

## 2023-06-01 NOTE — Progress Notes (Signed)
Referring Provider: Benita Stabile, MD Primary Care Physician:  Benita Stabile, MD Primary GI Physician: Dr. Marletta Lor  Chief Complaint  Patient presents with   Follow-up    Follow up. Stomach was hard and sore . It feels better today. But hemorrhoids are hurting and bleeding.      HPI:   Candice Stanley is a 57 y.o. female with history of type 2 diabetes, HTN, HLD, hypothyroidism, GERD with esophagitis, constipation, NASH cirrhosis with thrombocytopenia, esophageal varices, and ascites,  presenting today for further evaluation of abdominal pain.  She was seen in the office 05/30/23 for abdominal pain/abdominal distention.  Patient reported new onset abdominal swelling over the last few days, weight was up 20 pounds.  She has been drinking quite a bit of pickle juice due to leg cramps.  Stated she could not sit up straight.  Some nausea.  Worse after spicy/fried food.  Some trouble breathing due to abdominal distention.  Also with dyspnea on exertion, fatigue with going to the mailbox.  No significant LE edema.  BMs regular.  On exam, noted abdominal distention with more firm in the upper abdomen, some pitting of abdominal wall.  1+ bilateral lower extremity edema.  Recommended updating labs, abdominal ultrasound with paracentesis.   Labs completed showing hemoglobin stable at 11.2 with macrocytic indices but no B12 or folate deficiency, platelets stable at 58, stable leukopenia of 3.5, mild bump in AST at 59, otherwise LFTs within normal limits.  Bilirubin stable at 4.2 but indirect bilirubin 3.0.  INR elevated at 1.6.  Creatinine 0.6.  Sodium and potassium within normal limits.   MELD 3.0 of 19.  Child Pugh B.   Abdominal ultrasound showed nodular liver with splenomegaly and moderate ascites, prior cholecystectomy with no ductal dilation.  Paracentesis 11/20 yielding 2.8 L.  PMNs not consistent with SBP.  Gram stain negative.  Culture and cytology pending.  Patient called 11/21 reporting  feeling worse after paracentesis.  Recommended office visit.    Today: Had paracentesis Wednesday and felt significantly better right after.  States her abdominal distention went down, pain resolved, and her breathing improved.  She went out to eat and had a vegetable plate with 3 different vegetables and almost immediately felt nauseated and bloated.  She was quite bloated and nauseated all day yesterday.  Abdomen felt swollen and hard.  She did up taking Gas-X a couple of different times and woke up this morning feeling much improved.  She has been having trouble with nausea since starting Mounjaro a few months ago.  She is on this for diabetes and for weight loss.  States she was on Ozempic for short time and this was even worse than Mounjaro.  For now, she would like to continue this.  States she is not drinking anymore pickle juice.   Her primary concern today are hemorrhoids.  States she is been having some mild trouble since being on antibiotics in October for cellulitis as the antibiotics cause diarrhea.  However, over the last couple of days, hemorrhoids have been much worse and are now swollen, on the outside, and causing a lot of  rectal discomfort.  She is also had some intermittent toilet tissue hematochezia.  She is using Preparation H, Tucks, A&E ointment.  Having 1 normal stool daily. Not using lactulose.    EGD March 2024: -Grade 1 esophageal varices -Portal hypertensive gastropathy   Colonoscopy March 2024: -Nonbleeding internal hemorrhoids -Diverticulosis in the sigmoid colon -two 4-5 mm  polyps in the sigmoid colon removed, tubular adenoma -Stool in the entire examined colon -Next colonoscopy in 5 years    Past Medical History:  Diagnosis Date   Anxiety    Cirrhosis (HCC)    Depression    GERD (gastroesophageal reflux disease)    with grade d esophagitis in March 2023.   Hypercholesteremia    Hypertension    Trimalleolar fracture of ankle, closed, right,  sequela    Type 2 diabetes mellitus (HCC)     Past Surgical History:  Procedure Laterality Date   ABDOMINAL HYSTERECTOMY     BIOPSY  09/21/2021   Procedure: BIOPSY;  Surgeon: Dolores Frame, MD;  [facility]: [facility];  Service: Gastroenterology;;   CARDIAC CATHETERIZATION N/A 09/10/2015   Procedure: Left Heart Cath and Coronary Angiography;  Surgeon: Corky Crafts, MD;  Location: [facility];  Service: Cardiovascular;  Laterality: N/A;   CHOLECYSTECTOMY     COLONOSCOPY WITH PROPOFOL N/A 09/23/2022   Procedure: COLONOSCOPY WITH PROPOFOL;  Surgeon: Lanelle Bal, DO;  Location: [facility];  Service: Endoscopy;  Laterality: N/A;  1:15 pn, asa 3   ESOPHAGEAL BRUSHING  09/21/2021   Procedure: ESOPHAGEAL BRUSHING;  Surgeon: Marguerita Merles, Reuel Boom, MD;  Location: [facility];  Service: Gastroenterology;;   ESOPHAGOGASTRODUODENOSCOPY (EGD) WITH PROPOFOL N/A 09/21/2021   Surgeon: Dolores Frame, MD;  grade D esophagitis with scant bleeding, KOH negative, could not rule out small varices, but no medium to large varices, mild portal hypertensive gastropathy.  Pathology showed mild foveolar hyperplasia, negative for H. pylori.  Recommended repeat EGD in 3 months.   ESOPHAGOGASTRODUODENOSCOPY (EGD) WITH PROPOFOL N/A 09/23/2022   Surgeon: Lanelle Bal, DO; grade 1 esophageal varices, portal hypertensive gastropathy.  Recommended considering nonselective beta-blocker for primary prophylaxis.   OPEN REDUCTION INTERNAL FIXATION (ORIF) TIBIA/FIBULA FRACTURE Right 08/04/2016   Procedure: OPEN REDUCTION INTERNAL FIXATION (ORIF) RIGHT TRIMALLEOLAR FRACTURE;  Surgeon: Toni Arthurs, MD;  Location: [facility];  Service: Orthopedics;  Laterality: Right;   POLYPECTOMY  09/23/2022   Procedure: POLYPECTOMY INTESTINAL;  Surgeon: Lanelle Bal, DO;  Location: [facility];  Service: Endoscopy;;   WRIST SURGERY Left    ganglion cyst    Current  Outpatient Medications  Medication Sig Dispense Refill   calcium carbonate (TUMS - DOSED IN MG ELEMENTAL CALCIUM) 500 MG chewable tablet Chew 2 tablets by mouth daily as needed for indigestion or heartburn.     Cholecalciferol (VITAMIN D3) 50 MCG (2000 UT) TABS Take 2,000 Units by mouth daily.     FLUoxetine (PROZAC) 40 MG capsule Take 40 mg by mouth daily.     furosemide (LASIX) 40 MG tablet Take 1 tablet (40 mg total) by mouth daily. 30 tablet 3   lactulose (CHRONULAC) 10 GM/15ML solution Take 30 mLs (20 g total) by mouth daily as needed for mild constipation. Goal of 2-3 soft bowel movements daily. 946 mL 3   loratadine (CLARITIN) 10 MG tablet Take 10 mg by mouth daily.     metFORMIN (GLUCOPHAGE) 500 MG tablet Take 500 mg by mouth 2 (two) times daily with a meal.     MOUNJARO 10 MG/0.5ML Pen Inject 10 mg into the skin once a week.     nadolol (CORGARD) 20 MG tablet Take 1 tablet (20 mg total) by mouth daily. 30 tablet 3   pantoprazole (PROTONIX) 40 MG tablet TAKE 1 TABLET BY MOUTH TWICE DAILY BEFORE A MEAL 60 tablet 5   spironolactone (  ALDACTONE) 100 MG tablet Take 1 tablet (100 mg total) by mouth daily. 30 tablet 3   TYLENOL 500 MG tablet Take 1 tablet (500 mg total) by mouth every 6 (six) hours as needed for mild pain (pain score 1-3) or headache. 30 tablet 0   vitamin C (ASCORBIC ACID) 500 MG tablet Take 500 mg by mouth daily.      zinc gluconate 50 MG tablet Take 50 mg by mouth daily.     amLODipine (NORVASC) 2.5 MG tablet Take 1 tablet (2.5 mg total) by mouth daily. 30 tablet 1   No current facility-administered medications for this visit.    Allergies as of 06/02/2023 - Review Complete 06/02/2023  Allergen Reaction Noted   Hydromorphone Nausea And Vomiting and Other (See Comments) 04/26/2023   Vicodin [hydrocodone-acetaminophen] Nausea And Vomiting and Other (See Comments) 03/05/2012   Codeine Nausea And Vomiting 11/03/2009   Hydrocodone Nausea And Vomiting and Other (See Comments)  04/26/2023   Onion Other (See Comments) 04/26/2023   Tape Swelling and Other (See Comments) 02/21/2015   Latex Rash 12/06/2016    Family History  Problem Relation Age of Onset   Hypertension Mother    Cirrhosis Mother        NASH   Coronary artery disease Father    Heart disease Father    Barrett's esophagus Father    Coronary artery disease Paternal Uncle    Coronary artery disease Cousin    Colon cancer Neg Hx     Social History   Socioeconomic History   Marital status: Married    Spouse name: Not on file   Number of children: 2   Years of education: Not on file   Highest education level: Not on file  Occupational History   Occupation: property Geologist, engineering: [employer]  Tobacco Use   Smoking status: Former    Current packs/day: 0.00    Average packs/day: 0.3 packs/day for 10.0 years (2.5 ttl pk-yrs)    Types: Cigarettes    Start date: 05/19/1981    Quit date: 05/20/1991    Years since quitting: 32.0   Smokeless tobacco: Never   Tobacco comments:    Used to smoke a pack in 1-2 weeks/ 1992  Vaping Use   Vaping status: Never Used  Substance and Sexual Activity   Alcohol use: Not Currently   Drug use: No   Sexual activity: Yes    Birth control/protection: Surgical  Other Topics Concern   Not on file  Social History Narrative   Not on file   Social Determinants of Health   Financial Resource Strain: Not on file  Food Insecurity: No Food Insecurity (04/26/2023)   Hunger Vital Sign    Worried About Running Out of Food in the Last Year: Never true    Ran Out of Food in the Last Year: Never true  Transportation Needs: No Transportation Needs (04/26/2023)   PRAPARE - Administrator, Civil Service (Medical): No    Lack of Transportation (Non-Medical): No  Physical Activity: Not on file  Stress: Not on file  Social Connections: Not on file    Review of Systems: Gen: Denies fever, chills, cold or flulike symptoms, presyncope, syncope. CV:  Denies chest pain, palpitations. Resp: Denies dyspnea, cough. GI: See HPI Heme: See HPI  Physical Exam: BP 138/66 (BP Location: Left Arm, Patient Position: Sitting, Cuff Size: Large)   Pulse (!) 101   Temp 98.1 F (36.7 C) (Temporal)  Wt 274 lb 6.4 oz (124.5 kg)   BMI 47.10 kg/m  General:   Alert and oriented. No distress noted. Pleasant and cooperative.  Head:  Normocephalic and atraumatic. Eyes:  Conjuctiva clear without scleral icterus. Abdomen:  +BS, upper abdomen appears more distended than lower abdomen, but abdomen is soft and nontender.  No rebound or guarding. No HSM or masses noted.  She does have some slight pitting in the upper abdominal wall.   Rectal: Prolapsed internal hemorrhoids noted on external exam. No internal exam due to rectal discomfort. No fissure appreciated. Also with erythema with few excoriations perianally along with erythema without excoriations between the buttock.  Msk:  Symmetrical without gross deformities. Normal posture. Extremities:  With trace LE edema, right greater than left which is chronic due to history of ankle injury. Neurologic:  Alert and  oriented x4 Psych:  Normal mood and affect.    Assessment:  57 y.o. female with history of type 2 diabetes, HTN, HLD, hypothyroidism, GERD with esophagitis, constipation, NASH cirrhosis, presenting today for follow-up of abdominal pain and also reporting hemorrhoid issues.  Abdominal pain/bloating/nausea: Likely multifactorial.  Recently underwent paracentesis yielding 2.7 L on 11/20 with no evidence of SBP.  This was in the setting of dietary indiscretion, drinking significant amounts of pickle juice due to leg cramps which she has now discontinued.  She is also had new onset daily trouble with nausea, lack of appetite, and intermittent bloating since starting Mounjaro a few months ago.  Prior to this, she had no trouble.  We discussed that she is likely to continue with some degree of nausea, abdominal  discomfort, bloating related to [facility] and that she can discuss changing this medication with her PCP, but she prefers to continue this medication for now as it is helping with weight loss.   In light of her ascites, her abdomen is soft today.  I am not making any changes to her diuretic regimen as there was significant room for improvement in her diet which she states she is committed to at this time.  If she has any recurrent ascites or develops lower extremity edema, we can consider increasing diuretics.    Also discussed avoiding fried, fatty, greasy foods along with high fibrous foods as this will likely worsen her nausea/bloating related to [facility].  Hemorrhoids/perianal and buttock skin erythema: Currently having significant rectal discomfort and intermittent toilet tissue hematochezia in the setting of prolapsed internal hemorrhoids noted on exam today.  Also with perianal and buttock erythema likely secondary to fecal seepage from hemorrhoids as well as patient placing Tucks pads on her bottom and leaving them.  Suspect she has a fungal component currently.   She will use [facility] hemorrhoid cream with lidocaine for hemorrhoids along with Desitin and clotrimazole cream over areas of erythema.   NASH Cirrhosis:  Complicated by thrombocytopenia, esophageal varices on nadolol, and now with ascites undergoing first ever paracentesis on 11/20 yielding 2.7 L with no evidence of SBP.  Development of ascites likely secondary to dietary indiscretion, drinking significant amounts of pickle juice.  Considering this, I am not making any changes to her diuretic regimen yet as she states she is now committed to following a low-sodium diet.  If recurrent ascites or development of peripheral edema, will increase diuretics.    HCC screening is up-to-date 05/31/2023. EGD on file March 2024 with grade 1 varices, portal hypertensive gastropathy. MELD 3.0 is 19 driven by an INR of 1.6 and total  bilirubin of 4.2  with indirect 3.0 and direct 1.2. Child Pugh B  We discussed the possibility of referral to start the process for liver transplant evaluation, but no final decision was made today.  We can discuss this again at her next visit.   Plan:  [facility] hemorrhoid cream with lidocaine 3 times daily x 7-10 days. Apply Desitin to erythematous skin perianally and on buttock for protection/healing. Mix clotrimazole ointment with Desitin 3 times a day and apply to erythematous skin perianally and on buttock for the next week.  Limit toilet time to 2-3 minutes. Avoid straining. Sitz bath's a couple of time.  Counseled on the importance of strict low-sodium diet with no more than 2000 mg/day including everything that she eats and drinks. High-protein diet from a primarily plant-based diet. Avoid red meat.  No raw or undercooked meat, seafood, or shellfish. Low-fat/cholesterol/carbohydrate diet. At least 30 minutes of aerobic and resistance exercise 3 days/week. Continue current medication regimen.  Follow-up in 3 months or sooner if needed.     Ermalinda Memos, PA-C [facility] Gastroenterology 06/02/2023

## 2023-06-02 ENCOUNTER — Encounter: Payer: Self-pay | Admitting: Gastroenterology

## 2023-06-02 ENCOUNTER — Ambulatory Visit (INDEPENDENT_AMBULATORY_CARE_PROVIDER_SITE_OTHER): Payer: Medicaid Other | Admitting: Gastroenterology

## 2023-06-02 VITALS — BP 138/66 | HR 101 | Temp 98.1°F | Wt 274.4 lb

## 2023-06-02 DIAGNOSIS — L538 Other specified erythematous conditions: Secondary | ICD-10-CM

## 2023-06-02 DIAGNOSIS — R188 Other ascites: Secondary | ICD-10-CM | POA: Diagnosis not present

## 2023-06-02 DIAGNOSIS — R110 Nausea: Secondary | ICD-10-CM

## 2023-06-02 DIAGNOSIS — K7581 Nonalcoholic steatohepatitis (NASH): Secondary | ICD-10-CM

## 2023-06-02 DIAGNOSIS — K649 Unspecified hemorrhoids: Secondary | ICD-10-CM | POA: Diagnosis not present

## 2023-06-02 DIAGNOSIS — K625 Hemorrhage of anus and rectum: Secondary | ICD-10-CM

## 2023-06-02 DIAGNOSIS — K7460 Unspecified cirrhosis of liver: Secondary | ICD-10-CM | POA: Diagnosis not present

## 2023-06-02 DIAGNOSIS — R140 Abdominal distension (gaseous): Secondary | ICD-10-CM

## 2023-06-02 DIAGNOSIS — R630 Anorexia: Secondary | ICD-10-CM

## 2023-06-02 DIAGNOSIS — R109 Unspecified abdominal pain: Secondary | ICD-10-CM | POA: Diagnosis not present

## 2023-06-02 DIAGNOSIS — K648 Other hemorrhoids: Secondary | ICD-10-CM

## 2023-06-02 LAB — CYTOLOGY - NON PAP

## 2023-06-02 NOTE — Patient Instructions (Addendum)
For your hemorrhoids/perianal/buttock skin irritation:  Do your best to keep your bottom clean and dry from stool.  Use a mild soap when cleaning in the shower/bath. Apply [facility] hemorrhoid cream 3 times a day to your anus. Keep a layer of Desitin on your bottom for protection. You may mix in clotrimazole cream to the Desitin 3 times a day and apply it to the red areas of skin on your bottom. Limit toilet time to 2-3 minutes. Avoid straining. You may also try sitz bath's a couple of times a day to help with the discomfort.  For cirrhosis: Continue your current medications. Nutrition:  High-protein diet from a primarily plant-based diet. Avoid red meat.  No raw or undercooked meat, seafood, or shellfish. Low-fat/cholesterol/carbohydrate diet. Limit sodium to no more than 2000 mg/day including everything that you eat and drink. Recommend at least 30 minutes of aerobic and resistance exercise 3 days/week.    As we discussed, some of your abdominal pain, bloating, and your nausea is likely secondary to [facility].  We will plan for routine follow-up in 3 months, but let me know if you have any questions or concerns before then.  Ermalinda Memos, PA-C [facility] Gastroenterology

## 2023-06-05 ENCOUNTER — Encounter: Payer: Self-pay | Admitting: *Deleted

## 2023-06-05 ENCOUNTER — Telehealth: Payer: Self-pay | Admitting: Gastroenterology

## 2023-06-05 DIAGNOSIS — R110 Nausea: Secondary | ICD-10-CM

## 2023-06-05 DIAGNOSIS — R1010 Upper abdominal pain, unspecified: Secondary | ICD-10-CM

## 2023-06-05 LAB — CULTURE, BODY FLUID W GRAM STAIN -BOTTLE: Culture: NO GROWTH

## 2023-06-05 NOTE — Telephone Encounter (Signed)
Pt informed and message sent via MyChart with information of appt date, time and location.

## 2023-06-05 NOTE — Telephone Encounter (Signed)
[employer] PA:  CPT Code 57322 Description: CT ABDOMEN & PELVIS W/ Authorization Number: [id number] Case Number: [id number] Review Date: 06/05/2023 1:07:13 PM Expiration Date: 07/20/2023 Status: Your case has been Approved.

## 2023-06-05 NOTE — Telephone Encounter (Signed)
Received patient message with patient reporting ongoing postprandial upper abdominal pain.  We discussed this at her office visit on Friday.  I suspect this may be primarily driven by [facility], but recommended updating labs and CT.  Mindy/Tammy:  Please arrange C/P with contrast ASAP.  Dx: Upper abdominal pain, nausea, history of cirrhosis with ascites.

## 2023-06-07 ENCOUNTER — Ambulatory Visit (HOSPITAL_BASED_OUTPATIENT_CLINIC_OR_DEPARTMENT_OTHER)
Admission: RE | Admit: 2023-06-07 | Discharge: 2023-06-07 | Disposition: A | Discharge status: Home / Self Care | Payer: Medicaid Other | Source: Ambulatory Visit | Attending: Gastroenterology | Admitting: Gastroenterology

## 2023-06-07 DIAGNOSIS — R110 Nausea: Secondary | ICD-10-CM | POA: Diagnosis present

## 2023-06-07 DIAGNOSIS — R1010 Upper abdominal pain, unspecified: Secondary | ICD-10-CM | POA: Diagnosis present

## 2023-06-07 MED ORDER — IOHEXOL 300 MG/ML  SOLN
100.0000 mL | Freq: Once | INTRAMUSCULAR | Status: AC | PRN
  Administered 2023-06-07: 100 mL via INTRAVENOUS

## 2023-06-08 ENCOUNTER — Emergency Department (HOSPITAL_COMMUNITY): Payer: Medicaid Other

## 2023-06-08 ENCOUNTER — Other Ambulatory Visit: Payer: Self-pay

## 2023-06-08 ENCOUNTER — Encounter (HOSPITAL_COMMUNITY): Payer: Self-pay

## 2023-06-08 ENCOUNTER — Emergency Department (HOSPITAL_COMMUNITY)
Admission: EM | Admit: 2023-06-08 | Discharge: 2023-06-08 | Disposition: A | Discharge status: Home / Self Care | Payer: Medicaid Other | Attending: Emergency Medicine | Admitting: Emergency Medicine

## 2023-06-08 DIAGNOSIS — K644 Residual hemorrhoidal skin tags: Secondary | ICD-10-CM | POA: Insufficient documentation

## 2023-06-08 DIAGNOSIS — K6289 Other specified diseases of anus and rectum: Secondary | ICD-10-CM | POA: Diagnosis present

## 2023-06-08 DIAGNOSIS — Z91040 Latex allergy status: Secondary | ICD-10-CM | POA: Insufficient documentation

## 2023-06-08 LAB — CBC WITH DIFFERENTIAL/PLATELET
Abs Immature Granulocytes: 0.01 10*3/uL (ref 0.00–0.07)
Basophils Absolute: 0 10*3/uL (ref 0.0–0.1)
Basophils Relative: 1 %
Eosinophils Absolute: 0.1 10*3/uL (ref 0.0–0.5)
Eosinophils Relative: 2 %
HCT: 30.8 % — ABNORMAL LOW (ref 36.0–46.0)
Hemoglobin: 10 g/dL — ABNORMAL LOW (ref 12.0–15.0)
Immature Granulocytes: 0 %
Lymphocytes Relative: 30 %
Lymphs Abs: 1.1 10*3/uL (ref 0.7–4.0)
MCH: 33.1 pg (ref 26.0–34.0)
MCHC: 32.5 g/dL (ref 30.0–36.0)
MCV: 102 fL — ABNORMAL HIGH (ref 80.0–100.0)
Monocytes Absolute: 0.4 10*3/uL (ref 0.1–1.0)
Monocytes Relative: 11 %
Neutro Abs: 2 10*3/uL (ref 1.7–7.7)
Neutrophils Relative %: 56 %
Platelets: 45 10*3/uL — ABNORMAL LOW (ref 150–400)
RBC: 3.02 MIL/uL — ABNORMAL LOW (ref 3.87–5.11)
RDW: 16.5 % — ABNORMAL HIGH (ref 11.5–15.5)
WBC: 3.6 10*3/uL — ABNORMAL LOW (ref 4.0–10.5)
nRBC: 0 % (ref 0.0–0.2)

## 2023-06-08 LAB — COMPREHENSIVE METABOLIC PANEL
ALT: 24 U/L (ref 0–44)
AST: 37 U/L (ref 15–41)
Albumin: 2.6 g/dL — ABNORMAL LOW (ref 3.5–5.0)
Alkaline Phosphatase: 107 U/L (ref 38–126)
Anion gap: 7 (ref 5–15)
BUN: 11 mg/dL (ref 6–20)
CO2: 21 mmol/L — ABNORMAL LOW (ref 22–32)
Calcium: 8.6 mg/dL — ABNORMAL LOW (ref 8.9–10.3)
Chloride: 107 mmol/L (ref 98–111)
Creatinine, Ser: 0.66 mg/dL (ref 0.44–1.00)
GFR, Estimated: >60 mL/min (ref >60)
Glucose, Bld: 164 mg/dL — ABNORMAL HIGH (ref 70–99)
Potassium: 3.4 mmol/L — ABNORMAL LOW (ref 3.5–5.1)
Sodium: 135 mmol/L (ref 135–145)
Total Bilirubin: 4.3 mg/dL — ABNORMAL HIGH (ref <1.2)
Total Protein: 5.2 g/dL — ABNORMAL LOW (ref 6.5–8.1)

## 2023-06-08 LAB — PROTIME-INR
INR: 1.4 — ABNORMAL HIGH (ref 0.8–1.2)
Prothrombin Time: 17.6 s — ABNORMAL HIGH (ref 11.4–15.2)

## 2023-06-08 MED ORDER — IOHEXOL 300 MG/ML  SOLN: 80.0000 mL | Freq: Once | INTRAMUSCULAR | Status: DC | PRN

## 2023-06-08 MED ORDER — HYDROCORTISONE ACETATE 25 MG RE SUPP: 25.0000 mg | Freq: Two times a day (BID) | RECTAL | 0 refills | Status: DC

## 2023-06-08 MED ORDER — OXYCODONE-ACETAMINOPHEN 5-325 MG PO TABS: 1.0000 | ORAL_TABLET | ORAL | 0 refills | Status: DC | PRN

## 2023-06-08 MED ORDER — LIDOCAINE 5 % EX OINT: 1.0000 | TOPICAL_OINTMENT | CUTANEOUS | 0 refills | Status: DC | PRN

## 2023-06-08 MED ORDER — LIDOCAINE HCL URETHRAL/MUCOSAL 2 % EX GEL
1.0000 | Freq: Once | CUTANEOUS | Status: AC
  Administered 2023-06-08: 1 via TOPICAL
  Filled 2023-06-08: qty 10

## 2023-06-08 MED ORDER — OXYCODONE-ACETAMINOPHEN 5-325 MG PO TABS: 1.0000 | ORAL_TABLET | Freq: Four times a day (QID) | ORAL | 0 refills | Status: DC | PRN

## 2023-06-08 MED ORDER — ONDANSETRON 4 MG PO TBDP: ORAL_TABLET | ORAL | 0 refills | Status: DC

## 2023-06-08 MED ORDER — DOCUSATE SODIUM 100 MG PO CAPS: 100.0000 mg | ORAL_CAPSULE | Freq: Two times a day (BID) | ORAL | 0 refills | Status: DC

## 2023-06-08 MED ORDER — CLOTRIMAZOLE-BETAMETHASONE 1-0.05 % EX CREA: TOPICAL_CREAM | CUTANEOUS | 0 refills | Status: DC

## 2023-06-08 MED ORDER — IOHEXOL 300 MG/ML  SOLN
80.0000 mL | Freq: Once | INTRAMUSCULAR | Status: AC | PRN
  Administered 2023-06-08: 80 mL via INTRAVENOUS

## 2023-06-08 NOTE — ED Notes (Addendum)
Reviewed D/C information with the patient, pt verbalized understanding. No additional concerns at this time.   Prepack given to the patient - signed by both patient and this RN.

## 2023-06-08 NOTE — ED Triage Notes (Signed)
Pov from home cc of painful hemorrhoids  for a week. Went to pcp and they send in script for it but it hasn't helped. Obvious pain in triage. Unable to sit on bottom.

## 2023-06-08 NOTE — ED Notes (Addendum)
Pt assisted with repositioning in bed, call bell within reach.

## 2023-06-08 NOTE — ED Notes (Signed)
Patient transported back from CT

## 2023-06-08 NOTE — ED Notes (Signed)
Patient transported to CT 

## 2023-06-08 NOTE — ED Provider Notes (Signed)
[place] EMERGENCY DEPARTMENT AT [facility] Provider Note   CSN: [id number] Arrival date & time: 06/08/23  9562     History  No chief complaint on file.   Candice Stanley is a 57 y.o. female.  Patient presents to the emergency department for evaluation of rectal pain.  Patient reports that symptoms have been ongoing for a week.  She was seen by her primary care doctor has been using topical medications but it has not helped.       Home Medications Prior to Admission medications   Medication Sig Start Date End Date Taking? Authorizing Provider  clotrimazole-betamethasone (LOTRISONE) cream Apply to affected area 2 times daily prn 06/08/23  Yes Rhylan Kagel, Canary Brim, MD  docusate sodium (COLACE) 100 MG capsule Take 1 capsule (100 mg total) by mouth every 12 (twelve) hours. 06/08/23  Yes Xzayvion Vaeth, Canary Brim, MD  hydrocortisone (ANUSOL-HC) 25 MG suppository Place 1 suppository (25 mg total) rectally 2 (two) times daily. For 7 days 06/08/23  Yes Havana Baldwin, Canary Brim, MD  lidocaine (XYLOCAINE) 5 % ointment Apply 1 Application topically as needed. 06/08/23  Yes Vivyan Biggers, Canary Brim, MD  oxyCODONE-acetaminophen (PERCOCET) 5-325 MG tablet Take 1-2 tablets by mouth every 4 (four) hours as needed. 06/08/23  Yes Rik Wadel, Canary Brim, MD  oxyCODONE-acetaminophen (PERCOCET/ROXICET) 5-325 MG tablet Take 1 tablet by mouth every 6 (six) hours as needed for severe pain (pain score 7-10). 06/08/23  Yes Flara Storti, Canary Brim, MD  amLODipine (NORVASC) 2.5 MG tablet Take 1 tablet (2.5 mg total) by mouth daily. 04/28/23 05/28/23  Berton Mount I, MD  calcium carbonate (TUMS - DOSED IN MG ELEMENTAL CALCIUM) 500 MG chewable tablet Chew 2 tablets by mouth daily as needed for indigestion or heartburn. 11/01/21   [provider]  Cholecalciferol (VITAMIN D3) 50 MCG (2000 UT) TABS Take 2,000 Units by mouth daily.    [provider]  FLUoxetine (PROZAC) 40 MG capsule  Take 40 mg by mouth daily. 08/15/19   [provider]  furosemide (LASIX) 40 MG tablet Take 1 tablet (40 mg total) by mouth daily. 11/23/22   Letta Median, PA-C  lactulose (CHRONULAC) 10 GM/15ML solution Take 30 mLs (20 g total) by mouth daily as needed for mild constipation. Goal of 2-3 soft bowel movements daily. 12/08/22   Letta Median, PA-C  loratadine (CLARITIN) 10 MG tablet Take 10 mg by mouth daily.    [provider]  metFORMIN (GLUCOPHAGE) 500 MG tablet Take 500 mg by mouth 2 (two) times daily with a meal.    [provider]  MOUNJARO 10 MG/0.5ML Pen Inject 10 mg into the skin once a week. 02/28/23   [provider]  nadolol (CORGARD) 20 MG tablet Take 1 tablet (20 mg total) by mouth daily. 12/28/22   Letta Median, PA-C  pantoprazole (PROTONIX) 40 MG tablet TAKE 1 TABLET BY MOUTH TWICE DAILY BEFORE A MEAL 01/23/23   Letta Median, PA-C  spironolactone (ALDACTONE) 100 MG tablet Take 1 tablet (100 mg total) by mouth daily. 11/23/22   Letta Median, PA-C  TYLENOL 500 MG tablet Take 1 tablet (500 mg total) by mouth every 6 (six) hours as needed for mild pain (pain score 1-3) or headache. 04/28/23   Barnetta Chapel, MD  vitamin C (ASCORBIC ACID) 500 MG tablet Take 500 mg by mouth daily.     [provider]  zinc gluconate 50 MG tablet Take 50 mg by mouth daily.  [provider]      Allergies    Hydromorphone, Vicodin [hydrocodone-acetaminophen], Codeine, Hydrocodone, Onion, Tape, and Latex    Review of Systems   Review of Systems  Physical Exam Updated Vital Signs BP 126/63   Pulse 87   Temp 98.4 F (36.9 C)   Resp 18   Ht 5\' 4"  (1.626 m)   Wt 124.5 kg   SpO2 94%   BMI 47.10 kg/m  Physical Exam Vitals and nursing note reviewed.  Constitutional:      General: She is not in acute distress.    Appearance: She is well-developed. She is obese.  HENT:     Head: Normocephalic and atraumatic.      Mouth/Throat:     Mouth: Mucous membranes are moist.  Eyes:     General: Vision grossly intact. Gaze aligned appropriately.     Extraocular Movements: Extraocular movements intact.     Conjunctiva/sclera: Conjunctivae normal.  Cardiovascular:     Rate and Rhythm: Normal rate and regular rhythm.     Pulses: Normal pulses.     Heart sounds: Normal heart sounds, S1 normal and S2 normal. No murmur heard.    No friction rub. No gallop.  Pulmonary:     Effort: Pulmonary effort is normal. No respiratory distress.     Breath sounds: Normal breath sounds.  Abdominal:     General: Bowel sounds are normal.     Palpations: Abdomen is soft.     Tenderness: There is no abdominal tenderness. There is no guarding or rebound.     Hernia: No hernia is present.  Genitourinary:    Rectum: External hemorrhoid (Tender but nonthrombosed) present.     Comments: Generalized erythema and excoriations around anus Musculoskeletal:        General: No swelling.     Cervical back: Full passive range of motion without pain, normal range of motion and neck supple. No spinous process tenderness or muscular tenderness. Normal range of motion.     Right lower leg: No edema.     Left lower leg: No edema.  Skin:    General: Skin is warm and dry.     Capillary Refill: Capillary refill takes less than 2 seconds.     Findings: No ecchymosis, erythema, rash or wound.  Neurological:     General: No focal deficit present.     Mental Status: She is alert and oriented to person, place, and time.     GCS: GCS eye subscore is 4. GCS verbal subscore is 5. GCS motor subscore is 6.     Cranial Nerves: Cranial nerves 2-12 are intact.     Sensory: Sensation is intact.     Motor: Motor function is intact.     Coordination: Coordination is intact.  Psychiatric:        Attention and Perception: Attention normal.        Mood and Affect: Mood normal.        Speech: Speech normal.        Behavior: Behavior normal.     ED  Results / Procedures / Treatments   Labs (all labs ordered are listed, but only abnormal results are displayed) Labs Reviewed  CBC WITH DIFFERENTIAL/PLATELET - Abnormal; Notable for the following components:      Result Value   WBC 3.6 (*)    RBC 3.02 (*)    Hemoglobin 10.0 (*)    HCT 30.8 (*)    MCV 102.0 (*)    RDW  16.5 (*)    Platelets 45 (*)    All other components within normal limits  COMPREHENSIVE METABOLIC PANEL - Abnormal; Notable for the following components:   Potassium 3.4 (*)    CO2 21 (*)    Glucose, Bld 164 (*)    Calcium 8.6 (*)    Total Protein 5.2 (*)    Albumin 2.6 (*)    Total Bilirubin 4.3 (*)    All other components within normal limits  PROTIME-INR - Abnormal; Notable for the following components:   Prothrombin Time 17.6 (*)    INR 1.4 (*)    All other components within normal limits    EKG None  Radiology CT PELVIS W CONTRAST  Result Date: 06/08/2023 CLINICAL DATA:  Perianal abscess or fistula suspected. EXAM: CT PELVIS WITH CONTRAST TECHNIQUE: Multidetector CT imaging of the pelvis was performed using the standard protocol following the bolus administration of intravenous contrast. RADIATION DOSE REDUCTION: This exam was performed according to the departmental dose-optimization program which includes automated exposure control, adjustment of the mA and/or kV according to patient size and/or use of iterative reconstruction technique. CONTRAST:  80mL OMNIPAQUE IOHEXOL 300 MG/ML  SOLN COMPARISON:  Abdominal CT from yesterday FINDINGS: Urinary Tract: High-density in the urinary bladder from recent CT. No bladder wall thickening. No lower hydroureter stone. Bowel: No evidence of inflammation or obstruction. Distal colonic diverticulosis. No evidence of perianal abscess. No detected fistula. Vascular/Lymphatic: Unremarkable. Reproductive:  Hysterectomy. Other: Ascites in the setting of cirrhosis. The degree is stable from prior and remains low-density. Anasarca.  Musculoskeletal: No acute finding IMPRESSION: No perianal abscess or cellulitis detected. Electronically Signed   By: Tiburcio Pea M.D.   On: 06/08/2023 06:35   CT ABDOMEN PELVIS W CONTRAST  Result Date: 06/07/2023 CLINICAL DATA:  postprandial upper abdominal pain, nausea, history of cirrhosis with ascites EXAM: CT ABDOMEN AND PELVIS WITH CONTRAST TECHNIQUE: Multidetector CT imaging of the abdomen and pelvis was performed using the standard protocol following bolus administration of intravenous contrast. RADIATION DOSE REDUCTION: This exam was performed according to the departmental dose-optimization program which includes automated exposure control, adjustment of the mA and/or kV according to patient size and/or use of iterative reconstruction technique. CONTRAST:  OMNIPAQUE IOHEXOL 300 MG/ML  SOLN COMPARISON:  CT abdomen pelvis 10/31/2021, CT chest 01/23/2018 FINDINGS: Lower chest: No acute abnormality. Hepatobiliary: Nodular hepatic contour. No focal liver abnormality. Status post cholecystectomy. No biliary dilatation. Pancreas: No focal lesion. Normal pancreatic contour. No surrounding inflammatory changes. No main pancreatic ductal dilatation. Spleen: No focal lesion.  Enlarged in caliber measuring up to 17 cm. Adrenals/Urinary Tract: Chronic stable 2.6 cm left adrenal gland nodule with a density of 27 Hounsfield units likely representing an adrenal adenoma-no further follow-up indicated. No right adrenal gland nodule. Bilateral kidneys enhance symmetrically. No hydronephrosis. No hydroureter. The urinary bladder is unremarkable. Stomach/Bowel: Stomach is within normal limits. No evidence of small bowel wall thickening or dilatation. Colonic diverticulosis. Mild hypodense wall thickening of the ascending colon. The appendix appears normal. Vascular/Lymphatic: Perigastric venous collaterals. Paraesophageal varices. Esophageal varices. Recanalized paraumbilical vein. No abdominal aorta or iliac  aneurysm. No abdominal, pelvic, or inguinal lymphadenopathy. Reproductive: Status post hysterectomy. No adnexal masses. Other: Small to moderate volume simple free fluid ascites. No intraperitoneal free gas. No organized fluid collection. Musculoskeletal: No abdominal wall hernia or abnormality. Subcutaneus soft tissue edema. No suspicious lytic or blastic osseous lesions. No acute displaced fracture. L5-S1 intervertebral disc space vacuum phenomenon. Posterior disc osteophyte complex formation at  this level. IMPRESSION: 1. Cirrhosis with portal hypertension. No focal liver lesions identified. Please note that liver protocol enhanced MR and CT are the most sensitive tests for the screening detection of hepatocellular carcinoma in the high risk setting of cirrhosis. 2. Small to moderate volume simple free fluid ascites. 3. Portal colopathy. 4. Colonic diverticulosis with no acute diverticulitis. Electronically Signed   By: Tish Frederickson M.D.   On: 06/07/2023 19:26    Procedures Procedures    Medications Ordered in ED Medications  iohexol (OMNIPAQUE) 300 MG/ML solution 80 mL (has no administration in time range)  iohexol (OMNIPAQUE) 300 MG/ML solution 80 mL (80 mLs Intravenous Contrast Given 06/08/23 0508)    ED Course/ Medical Decision Making/ A&P                                 Medical Decision Making Amount and/or Complexity of Data Reviewed Labs: ordered. Radiology: ordered.  Risk Prescription drug management.   Presents to the emergency department for evaluation of rectal pain.  Patient reports that she has a hemorrhoid.  Examination does reveal a large external hemorrhoid that is tender, nonbleeding.  No evidence of thrombosis.  Patient with a large amount of erythema and excoriations in the perianal region, possibly from topical applications she has been performing.  Lab work unremarkable.  CT scan does not show any deep pelvic infections, abscesses or other  abnormality.        Final Clinical Impression(s) / ED Diagnoses Final diagnoses:  External hemorrhoid    Rx / DC Orders ED Discharge Orders          Ordered    lidocaine (XYLOCAINE) 5 % ointment  As needed        06/08/23 0645    oxyCODONE-acetaminophen (PERCOCET/ROXICET) 5-325 MG tablet  Every 6 hours PRN        06/08/23 0645    oxyCODONE-acetaminophen (PERCOCET) 5-325 MG tablet  Every 4 hours PRN        06/08/23 0645    clotrimazole-betamethasone (LOTRISONE) cream        06/08/23 0645    hydrocortisone (ANUSOL-HC) 25 MG suppository  2 times daily        06/08/23 0645    docusate sodium (COLACE) 100 MG capsule  Every 12 hours        06/08/23 0645              Gilda Crease, MD 06/08/23 [phone]

## 2023-06-09 MED FILL — Oxycodone w/ Acetaminophen Tab 5-325 MG: ORAL | Qty: 6 | Status: AC

## 2023-06-12 ENCOUNTER — Encounter: Payer: Self-pay | Admitting: *Deleted

## 2023-06-12 ENCOUNTER — Ambulatory Visit (HOSPITAL_COMMUNITY)
Admission: RE | Admit: 2023-06-12 | Discharge: 2023-06-12 | Disposition: A | Discharge status: Home / Self Care | Payer: Medicaid Other | Source: Ambulatory Visit | Attending: Gastroenterology | Admitting: Gastroenterology

## 2023-06-12 ENCOUNTER — Encounter (HOSPITAL_COMMUNITY): Payer: Self-pay

## 2023-06-12 ENCOUNTER — Encounter: Payer: Self-pay | Admitting: Gastroenterology

## 2023-06-12 ENCOUNTER — Other Ambulatory Visit: Payer: Self-pay | Admitting: *Deleted

## 2023-06-12 ENCOUNTER — Telehealth: Payer: Self-pay | Admitting: *Deleted

## 2023-06-12 ENCOUNTER — Other Ambulatory Visit (HOSPITAL_COMMUNITY)
Admission: RE | Admit: 2023-06-12 | Discharge: 2023-06-12 | Disposition: A | Discharge status: Home / Self Care | Payer: Medicaid Other | Source: Ambulatory Visit | Attending: Gastroenterology | Admitting: Gastroenterology

## 2023-06-12 ENCOUNTER — Ambulatory Visit (INDEPENDENT_AMBULATORY_CARE_PROVIDER_SITE_OTHER): Payer: Medicaid Other | Admitting: Gastroenterology

## 2023-06-12 ENCOUNTER — Other Ambulatory Visit: Payer: Self-pay | Admitting: Gastroenterology

## 2023-06-12 VITALS — BP 138/62 | HR 96 | Temp 98.0°F | Resp 16

## 2023-06-12 VITALS — BP 144/85 | HR 120 | Temp 97.9°F | Ht 64.0 in | Wt 295.0 lb

## 2023-06-12 DIAGNOSIS — R188 Other ascites: Secondary | ICD-10-CM | POA: Insufficient documentation

## 2023-06-12 DIAGNOSIS — K6289 Other specified diseases of anus and rectum: Secondary | ICD-10-CM | POA: Diagnosis not present

## 2023-06-12 DIAGNOSIS — K644 Residual hemorrhoidal skin tags: Secondary | ICD-10-CM | POA: Diagnosis not present

## 2023-06-12 DIAGNOSIS — K649 Unspecified hemorrhoids: Secondary | ICD-10-CM

## 2023-06-12 DIAGNOSIS — K7460 Unspecified cirrhosis of liver: Secondary | ICD-10-CM

## 2023-06-12 DIAGNOSIS — L98491 Non-pressure chronic ulcer of skin of other sites limited to breakdown of skin: Secondary | ICD-10-CM

## 2023-06-12 DIAGNOSIS — R1010 Upper abdominal pain, unspecified: Secondary | ICD-10-CM

## 2023-06-12 DIAGNOSIS — K602 Anal fissure, unspecified: Secondary | ICD-10-CM

## 2023-06-12 DIAGNOSIS — R112 Nausea with vomiting, unspecified: Secondary | ICD-10-CM

## 2023-06-12 DIAGNOSIS — Z860101 Personal history of adenomatous and serrated colon polyps: Secondary | ICD-10-CM

## 2023-06-12 DIAGNOSIS — R110 Nausea: Secondary | ICD-10-CM | POA: Diagnosis not present

## 2023-06-12 LAB — IRON AND TIBC
Iron: 110 ug/dL (ref 28–170)
Saturation Ratios: 35 % — ABNORMAL HIGH (ref 10.4–31.8)
TIBC: 312 ug/dL (ref 250–450)
UIBC: 202 ug/dL

## 2023-06-12 LAB — BASIC METABOLIC PANEL
Anion gap: 12 (ref 5–15)
BUN: 13 mg/dL (ref 6–20)
CO2: 19 mmol/L — ABNORMAL LOW (ref 22–32)
Calcium: 9.7 mg/dL (ref 8.9–10.3)
Chloride: 105 mmol/L (ref 98–111)
Creatinine, Ser: 0.72 mg/dL (ref 0.44–1.00)
GFR, Estimated: >60 mL/min (ref >60)
Glucose, Bld: 206 mg/dL — ABNORMAL HIGH (ref 70–99)
Potassium: 4.1 mmol/L (ref 3.5–5.1)
Sodium: 136 mmol/L (ref 135–145)

## 2023-06-12 LAB — CBC WITH DIFFERENTIAL/PLATELET
Abs Immature Granulocytes: 0.03 10*3/uL (ref 0.00–0.07)
Basophils Absolute: 0 10*3/uL (ref 0.0–0.1)
Basophils Relative: 1 %
Eosinophils Absolute: 0 10*3/uL (ref 0.0–0.5)
Eosinophils Relative: 0 %
HCT: 34.7 % — ABNORMAL LOW (ref 36.0–46.0)
Hemoglobin: 11.1 g/dL — ABNORMAL LOW (ref 12.0–15.0)
Immature Granulocytes: 1 %
Lymphocytes Relative: 15 %
Lymphs Abs: 0.8 10*3/uL (ref 0.7–4.0)
MCH: 33 pg (ref 26.0–34.0)
MCHC: 32 g/dL (ref 30.0–36.0)
MCV: 103.3 fL — ABNORMAL HIGH (ref 80.0–100.0)
Monocytes Absolute: 0.4 10*3/uL (ref 0.1–1.0)
Monocytes Relative: 6 %
Neutro Abs: 4.5 10*3/uL (ref 1.7–7.7)
Neutrophils Relative %: 77 %
Platelets: 79 10*3/uL — ABNORMAL LOW (ref 150–400)
RBC: 3.36 MIL/uL — ABNORMAL LOW (ref 3.87–5.11)
RDW: 17.4 % — ABNORMAL HIGH (ref 11.5–15.5)
WBC: 5.7 10*3/uL (ref 4.0–10.5)
nRBC: 0 % (ref 0.0–0.2)

## 2023-06-12 LAB — GRAM STAIN

## 2023-06-12 LAB — BODY FLUID CELL COUNT WITH DIFFERENTIAL
Eos, Fluid: 0 %
Lymphs, Fluid: 15 %
Monocyte-Macrophage-Serous Fluid: 55 % (ref 50–90)
Neutrophil Count, Fluid: 30 % — ABNORMAL HIGH (ref 0–25)
Total Nucleated Cell Count, Fluid: 217 uL (ref 0–1000)

## 2023-06-12 LAB — FERRITIN: Ferritin: 32 ng/mL (ref 11–307)

## 2023-06-12 MED ORDER — FUROSEMIDE 40 MG PO TABS: 40.0000 mg | ORAL_TABLET | Freq: Two times a day (BID) | ORAL | 3 refills | Status: DC

## 2023-06-12 MED ORDER — ALBUMIN HUMAN 25 % IV SOLN
INTRAVENOUS | Status: AC
  Filled 2023-06-12: qty 100

## 2023-06-12 MED ORDER — ALBUMIN HUMAN 25 % IV SOLN
25.0000 g | Freq: Once | INTRAVENOUS | Status: AC
  Administered 2023-06-12: 25 g via INTRAVENOUS

## 2023-06-12 MED ORDER — SPIRONOLACTONE 100 MG PO TABS: 100.0000 mg | ORAL_TABLET | Freq: Two times a day (BID) | ORAL | 3 refills | Status: DC

## 2023-06-12 MED ORDER — LIDOCAINE HCL (PF) 2 % IJ SOLN
10.0000 mL | Freq: Once | INTRAMUSCULAR | Status: AC
  Administered 2023-06-12: 10 mL

## 2023-06-12 MED ORDER — LIDOCAINE HCL (PF) 2 % IJ SOLN
INTRAMUSCULAR | Status: AC
  Filled 2023-06-12: qty 10

## 2023-06-12 NOTE — Progress Notes (Signed)
Patient tolerated left sided paracentesis procedure and 50G of IV albumin well today and 5 Liters of clear yellow ascites removed with labs collected and sent for processing. Patient verbalized understanding of discharge instructions and left with daughter via wheelchair with no acute distress noted.

## 2023-06-12 NOTE — Telephone Encounter (Signed)
[place] APPROVED VIA [employer]:  Authorization #: [id number] DOS: Jun 26, 2023 - Sep 24, 2023

## 2023-06-12 NOTE — Telephone Encounter (Signed)
Candice Stanley, ok to provide patient with work note as requested.

## 2023-06-12 NOTE — Patient Instructions (Addendum)
I am going to get a paracentesis arranged for you.  Please have blood work completed at [facility].  We will also get you scheduled for an upper endoscopy with Dr. Marletta Lor.   Follow a strict low-sodium diet.  No more than 2000 mg of sodium per 24 hours.  I have attached a separate copy of a low-sodium diet information for you.  We will get you referred to [facility] for starting liver transplant evaluation.  For your hemorrhoids/possible anal fissure: I am going to send in a cream to [facility] that has nitroglycerin and it to help treat a possible anal fissure.  You apply this cream 3-4 times a day into the anal canal.  You only need to apply a small pea-sized amount.  This medication can cause lightheadedness and you should apply the medication laying down.  I am going to refer you to general surgery to discuss management of hemorrhoids.  Regarding your skin break down, I recommend using the cream prescribed by the ER and applying a thick layer of Desitin over top to protect your bottom from the constant moisture due to fecal seepage.  As it looks like you have multiple ulcerations on your perianal skin along with your labia, this may be related to the constant moisture of your skin related to fecal seepage causing skin breakdown, but I would recommend that you see your primary care doctor to consider testing for HSV.  To help with fecal seepage, start Benefiber 3 teaspoons daily.  Increase to twice daily after 1 week.   I will plan to see you back in 4 weeks or sooner if needed.   Ermalinda Memos, PA-C [facility]

## 2023-06-12 NOTE — Telephone Encounter (Signed)
Patient is coming into the office to be re-evaluated.

## 2023-06-12 NOTE — Procedures (Signed)
Pre Procedural Dx: Symptomatic Ascites Post Procedural Dx: Same  Successful US guided paracentesis yielding 5 L of serous ascitic fluid. Sample sent to laboratory as requested.  EBL: None  Complications: None immediate  Jay Matty Vanroekel, MD Pager #: [phone]   

## 2023-06-12 NOTE — Progress Notes (Signed)
Referring Provider: Benita Stabile, MD Primary Care Physician:  Benita Stabile, MD Primary GI Physician: Dr. Marletta Lor  Chief Complaint  Patient presents with   Follow-up    Stomach is swollen and hemorrhoids.     HPI:   Candice Stanley is a 57 y.o. female with history of type 2 diabetes, HTN, HLD, hypothyroidism, GERD with esophagitis, constipation, NASH cirrhosis with thrombocytopenia, esophageal varices, and ascites,  presenting today for further evaluation of abdominal pain and rectal pain.   Previously seen on 05/30/23 for abdominal pain/abdominal distention.  Patient reported new onset abdominal swelling over the last few days, weight was up 20 pounds.  She has been drinking quite a bit of pickle juice due to leg cramps. Some nausea. Worse after spicy/fried food.  Also with dyspnea on exertion.  Her labs were updated with no acute normalities.  Abdominal ultrasound with nodular liver, splenomegaly, moderate ascites.  Paracentesis 11/20 yielding 2.8 L, negative for SBP.  Seen in the office for reevaluation of abdominal pain on 11/22.  She reported fairly immediate improvement in her abdominal pain/bloating as well as dyspnea after paracentesis, but abdominal discomfort/bloating and nausea returned the same day after eating a vegetable plate.  Aside from these acute symptoms, she reported having new onset daily trouble with nausea, lack of appetite, intermittent bloating since starting Mounjaro a few months ago.  Prior to this, she had no trouble but she preferred to continue [facility]  Advised that she could discuss changing medications with PCP.  Held off on changing diuretics as I was hopeful that dietary changes would prevent recurrent ascites.  She was also dealing with significant rectal discomfort and intermittent toilet tissue hematochezia in the setting of hemorrhoids.  Also with perianal and buttock erythema with suspected fungal component.  She was prescribed [facility] hemorrhoid  cream with lidocaine for hemorrhoids and recommended applying Desitin and clotrimazole to the erythematous areas on her buttock.  Patient sent a message on 11/25 reporting her abdominal pain had returned, worsened postprandially.  She was scheduled for a CT of her abdomen and pelvis with contrast which was completed on 11/27 showing small to moderate volume ascites, portal colopathy, no acute findings.  Patient presented to the emergency room on 11/28 due to ongoing rectal pain.  CT of her pelvis was negative for any acute findings.  Laboratory evaluation remarkable for hemoglobin of 10.0, down from 11.2, 9 days prior.  She was again found to have hemorrhoids on exam.  She was prescribed lidocaine ointment, Percocet, clotrimazole-betamethasone cream, Anusol suppository, and Colace.  Spoke with patient today.  She reports ongoing rectal pain, abdominal distention, abdominal pain, worse with eating.  Also with ongoing fatigue, dyspnea on exertion.  Recommended returning to the office for further evaluation.  Today:  Stopped taking Mounjaro for almost 4 weeks. Continue with intermittent upper abdominal pain. Sometimes worse with food. Avoid fried foods. Some intermittent nausea, but no vomiting. Using zofran prn.   NSAIDs: None.   Abdomen is swollen. Also with dyspnea on minimal exertion, lack of energy.   Continues with severe rectal pain, unrelenting. Noting is helping. Continues with fecal seepage. Having 1 complete BM daily but then with fecal seepage. Hasn't needed lactulose. Intermittent toilet tissue hematochezia. No blood in the stool or in toilet water.   Hasn't taken any percocet (prescribed by the ER) for the rectal pain.    Last EGD 09/23/2022: Grade 1 esophageal varices, portal hypertensive gastropathy.  Last colonoscopy 09/23/2022 with nonbleeding  internal hemorrhoids, diverticulosis in the sigmoid colon, two 4-5 mm polyps in the sigmoid colon resected and retrieved, stool in the entire  examined colon that this was able to be cleared with fair visualization.  Pathology with tubular adenomas.  Recommended 5-year surveillance.  Past Medical History:  Diagnosis Date   Anxiety    Cirrhosis (HCC)    Depression    GERD (gastroesophageal reflux disease)    with grade d esophagitis in March 2023.   Hypercholesteremia    Hypertension    Trimalleolar fracture of ankle, closed, right, sequela    Type 2 diabetes mellitus (HCC)     Past Surgical History:  Procedure Laterality Date   ABDOMINAL HYSTERECTOMY     BIOPSY  09/21/2021   Procedure: BIOPSY;  Surgeon: Dolores Frame, MD;  [facility];  Service: Gastroenterology;;   CARDIAC CATHETERIZATION N/A 09/10/2015   Procedure: Left Heart Cath and Coronary Angiography;  Surgeon: Corky Crafts, MD;  Location: [facility] INVASIVE CV LAB;  Service: Cardiovascular;  Laterality: N/A;   CHOLECYSTECTOMY     COLONOSCOPY WITH PROPOFOL N/A 09/23/2022   Procedure: COLONOSCOPY WITH PROPOFOL;  Surgeon: Lanelle Bal, DO;  Location: [facility];  Service: Endoscopy;  Laterality: N/A;  1:15 pn, asa 3   ESOPHAGEAL BRUSHING  09/21/2021   Procedure: ESOPHAGEAL BRUSHING;  Surgeon: Marguerita Merles, Reuel Boom, MD;  Location: [facility];  Service: Gastroenterology;;   ESOPHAGOGASTRODUODENOSCOPY (EGD) WITH PROPOFOL N/A 09/21/2021   Surgeon: Dolores Frame, MD;  grade D esophagitis with scant bleeding, KOH negative, could not rule out small varices, but no medium to large varices, mild portal hypertensive gastropathy.  Pathology showed mild foveolar hyperplasia, negative for H. pylori.  Recommended repeat EGD in 3 months.   ESOPHAGOGASTRODUODENOSCOPY (EGD) WITH PROPOFOL N/A 09/23/2022   Surgeon: Lanelle Bal, DO; grade 1 esophageal varices, portal hypertensive gastropathy.  Recommended considering nonselective beta-blocker for primary prophylaxis.   OPEN REDUCTION INTERNAL FIXATION (ORIF) TIBIA/FIBULA FRACTURE  Right 08/04/2016   Procedure: OPEN REDUCTION INTERNAL FIXATION (ORIF) RIGHT TRIMALLEOLAR FRACTURE;  Surgeon: Toni Arthurs, MD;  Location: [facility];  Service: Orthopedics;  Laterality: Right;   POLYPECTOMY  09/23/2022   Procedure: POLYPECTOMY INTESTINAL;  Surgeon: Lanelle Bal, DO;  Location: [facility];  Service: Endoscopy;;   WRIST SURGERY Left    ganglion cyst    Current Outpatient Medications  Medication Sig Dispense Refill   calcium carbonate (TUMS - DOSED IN MG ELEMENTAL CALCIUM) 500 MG chewable tablet Chew 2 tablets by mouth daily as needed for indigestion or heartburn.     Cholecalciferol (VITAMIN D3) 50 MCG (2000 UT) TABS Take 2,000 Units by mouth daily.     clotrimazole-betamethasone (LOTRISONE) cream Apply to affected area 2 times daily prn 15 g 0   docusate sodium (COLACE) 100 MG capsule Take 1 capsule (100 mg total) by mouth every 12 (twelve) hours. 20 capsule 0   FLUoxetine (PROZAC) 40 MG capsule Take 40 mg by mouth daily.     furosemide (LASIX) 40 MG tablet Take 1 tablet (40 mg total) by mouth daily. 30 tablet 3   hydrocortisone (ANUSOL-HC) 25 MG suppository Place 1 suppository (25 mg total) rectally 2 (two) times daily. For 7 days 14 suppository 0   lactulose (CHRONULAC) 10 GM/15ML solution Take 30 mLs (20 g total) by mouth daily as needed for mild constipation. Goal of 2-3 soft bowel movements daily. 946 mL 3   lidocaine (XYLOCAINE) 5 % ointment Apply 1 Application  topically as needed. 35.44 g 0   loratadine (CLARITIN) 10 MG tablet Take 10 mg by mouth daily.     metFORMIN (GLUCOPHAGE) 500 MG tablet Take 500 mg by mouth 2 (two) times daily with a meal.     nadolol (CORGARD) 20 MG tablet Take 1 tablet (20 mg total) by mouth daily. 30 tablet 3   ondansetron (ZOFRAN-ODT) 4 MG disintegrating tablet 4mg  ODT q4 hours prn nausea/vomit 10 tablet 0   oxyCODONE-acetaminophen (PERCOCET) 5-325 MG tablet Take 1-2 tablets by mouth every 4 (four) hours as needed. 10  tablet 0   oxyCODONE-acetaminophen (PERCOCET/ROXICET) 5-325 MG tablet Take 1 tablet by mouth every 6 (six) hours as needed for severe pain (pain score 7-10). 6 tablet 0   pantoprazole (PROTONIX) 40 MG tablet TAKE 1 TABLET BY MOUTH TWICE DAILY BEFORE A MEAL 60 tablet 5   spironolactone (ALDACTONE) 100 MG tablet Take 1 tablet (100 mg total) by mouth daily. 30 tablet 3   TYLENOL 500 MG tablet Take 1 tablet (500 mg total) by mouth every 6 (six) hours as needed for mild pain (pain score 1-3) or headache. 30 tablet 0   vitamin C (ASCORBIC ACID) 500 MG tablet Take 500 mg by mouth daily.      zinc gluconate 50 MG tablet Take 50 mg by mouth daily.     amLODipine (NORVASC) 2.5 MG tablet Take 1 tablet (2.5 mg total) by mouth daily. 30 tablet 1   MOUNJARO 10 MG/0.5ML Pen Inject 10 mg into the skin once a week. (Patient not taking: Reported on 06/12/2023)     No current facility-administered medications for this visit.    Allergies as of 06/12/2023 - Review Complete 06/12/2023  Allergen Reaction Noted   Hydromorphone Nausea And Vomiting and Other (See Comments) 04/26/2023   Vicodin [hydrocodone-acetaminophen] Nausea And Vomiting and Other (See Comments) 03/05/2012   Codeine Nausea And Vomiting 11/03/2009   Hydrocodone Nausea And Vomiting and Other (See Comments) 04/26/2023   Onion Other (See Comments) 04/26/2023   Tape Swelling and Other (See Comments) 02/21/2015   Latex Rash 12/06/2016    Family History  Problem Relation Age of Onset   Hypertension Mother    Cirrhosis Mother        NASH   Coronary artery disease Father    Heart disease Father    Barrett's esophagus Father    Coronary artery disease Paternal Uncle    Coronary artery disease Cousin    Colon cancer Neg Hx     Social History   Socioeconomic History   Marital status: Married    Spouse name: Not on file   Number of children: 2   Years of education: Not on file   Highest education level: Not on file  Occupational History    Occupation: property Geologist, engineering: [employer]  Tobacco Use   Smoking status: Former    Current packs/day: 0.00    Average packs/day: 0.3 packs/day for 10.0 years (2.5 ttl pk-yrs)    Types: Cigarettes    Start date: 05/19/1981    Quit date: 05/20/1991    Years since quitting: 32.0   Smokeless tobacco: Never   Tobacco comments:    Used to smoke a pack in 1-2 weeks/ 1992  Vaping Use   Vaping status: Never Used  Substance and Sexual Activity   Alcohol use: Not Currently   Drug use: No   Sexual activity: Yes    Birth control/protection: Surgical  Other Topics Concern  Not on file  Social History Narrative   Not on file   Social Determinants of Health   Financial Resource Strain: Not on file  Food Insecurity: No Food Insecurity (04/26/2023)   Hunger Vital Sign    Worried About Running Out of Food in the Last Year: Never true    Ran Out of Food in the Last Year: Never true  Transportation Needs: No Transportation Needs (04/26/2023)   PRAPARE - Administrator, Civil Service (Medical): No    Lack of Transportation (Non-Medical): No  Physical Activity: Not on file  Stress: Not on file  Social Connections: Not on file    Review of Systems: Gen: Denies fever, chills, cold or flu like symptoms. Admits to low energy.  CV: Denies chest pain, palpitations. Resp: Admits to SOB with little activity. No cough.  GI: See HPI Derm: Denies rash. Heme: See HPI  Physical Exam: BP (!) 144/85 (BP Location: Right Arm, Patient Position: Standing)   Pulse (!) 120   Temp 97.9 F (36.6 C) (Temporal)   Ht 5\' 4"  (1.626 m)   Wt 295 lb (133.8 kg)   BMI 50.64 kg/m  General:   Alert and oriented. No distress noted. Pleasant and cooperative.  Head:  Normocephalic and atraumatic. Eyes:  Conjuctiva clear without scleral icterus. Heart:  S1, S2 present without murmurs appreciated. Lungs:  Clear to auscultation bilaterally. No wheezes, rales, or rhonchi. No distress.  Abdomen:   +BS, distended and tense, upper abdomen more prominent than lower abdomen, non-tender. Abdominal wall pitting edema present.  Rectal: Large hemorrhoid externally on the posterior left side. Possible anterior anal fissure at 12 o'clock position though very difficult to see.  Significant erythema along with excoriations as well as multiple, small demarcated ulcerations perianally and with erythema extending back through the butt cheeks.  Msk:  Symmetrical without gross deformities. Normal posture. Extremities:  With 1+ bilateral LE edema.  Neurologic:  Alert and  oriented x4 Psych:  Normal mood and affect.    Assessment:  57 y.o. female with history of type 2 diabetes, HTN, HLD, hypothyroidism, GERD with esophagitis, constipation, NASH cirrhosis with thrombocytopenia, esophageal varices, and ascites,  presenting today for further evaluation of abdominal pain and rectal pain.  Rectal pain/hemorrhoids/possible anal fissure/perianal skin ulceration: Severe rectal pain for the last couple of weeks in the setting of large external hemorrhoid that appears to be purple at this point, but not firm, possibly developing thrombosis vs resolving. Also with possible anterior anal fissure but very difficult to see. At this point, she is failing medical management of her hemorrhoids. I will plan to start her on a hemorrhoid cream compounded with 0.125% nitroglycerine for possible anal fissure and refer her to general surgery asap. I am not sure she is a great candidate for surgery in the setting of cirrhosis, but as she has failed medical management, we will see what surgery has to offer. Case was discussed with Dr. Jena Gauss as Dr. Marletta Lor not in the office, patient likely needs good rectal exam under general surgery with possible hemorrhoidectomy +/- sphincterotomy depending on if she has any improvement with nitroglycerine. Her colonoscopy is up to date in March 2024 with internal hemorrhoids and 2 tubular adenomas. No  rectal varices at that time.   Perianal skin ulceration/erythema:  Likely secondary to fecal seepage in the setting of hemorrhoids. Initially with significant erythema with few excoriations when seen in the office last week, but now with what look like several small ulcerations  perianally and on the posterior labia. Unable to rule out underlying herpes, but suspect this is primarily skin breakdown from constant moisture to the area. Recommended continuing Lotrisone cream 2-3 times a day as prescribed by the ER and overlay this with a thick layer of Desitin to protect the skin.  Also recommended seeing PCP for consideration of herpes testing.  Upper abdominal pain/nausea:  Etiology unclear. Gershon Cull thought this was secondary to [facility], but stopped Mounjaro close to 4 weeks ago and continues to have intermittent symptoms. Denies NSAIDs. Chronically on Protonix BID. However, I do note a recent decline in her hemoglobin from 11.2 on 11/19 to 10.0 on 11/28.  At this point, we will proceed with EGD for further evaluation.   Anemia:  Recent decline in her hemoglobin from 11.2 on 11/19 to 10.0 on 11/28. Denies melena. She is having intermittent toilet tissue hematochezia in the setting of hemorrhoids and possible anal fissure as per above. EGD and colonoscopy on file in March 2024 with grade 1 esophageal varices, PHG, non-bleeding internal hemorrhoids, 2 small tubular adenomas. May have oozing from PHG, but planning to update EGD in the near future for evaluation of upper abdominal pain and nausea. Will recheck CBC and check iron panel today. No B12 or folate deficiency on recent labs.   Cirrhosis with ascites:  Complicated by thrombocytopenia, esophageal varices on nadolol, and now with ascites undergoing first ever paracentesis on 11/20 yielding 2.7 L with no evidence of SBP.  Suspected ascites was secondary to dietary indiscretion, but unfortunately patient has developed recurrent abdominal distention over  the last week and is in need of repeat paracentesis.  She reports compliance with current diuretic regimen of spironolactone 100 mg daily and Lasix 40 mg daily along with trying to follow a low-sodium diet.  Recent labs on 11/28 with slight hypokalemia of 3.4.  Will plan to repeat labs today to recheck potassium and increase diuretics thereafter.   MELD score has been increasing recently, MELD was 20 on 11/28.  We discussed referral to start transplant evaluation and she would like to be seen by [facility].  Will get this referral started.  HCC screening is up-to-date 05/31/2023. EGD on file March 2024 with grade 1 varices, portal hypertensive gastropathy.   Plan:  CBC, BMP, iron panel.   Paracentesis stat  Labs including body fluid cell count, Gram stain, culture.  25 g of 25% IV albumin to be given at the start of para.  Max 5L  Proceed with upper endoscopy with propofol by Dr. Marletta Lor in near future. The risks, benefits, and alternatives have been discussed with the patient in detail. The patient states understanding and desires to proceed.  ASA 3 No AM diabetes medications.  No Mounjaro x 1 week prior if resumed prior to EGD.   Refer to general surgery ASAP for hemorrhoids failing medical management, possible anal fissure.  Start Benefiber 3 teaspoons daily x 1 week, then increase to twice daily.  Start [facility] hemorrhoid cream compounded with 0.125% nitroglycerin 3-4 times daily.  Continue Lotrisone cream 2-3 times a day as prescribed by the ER and overlay this with a thick layer of Desitin to protect the skin.    Recommended seeing PCP for consideration of herpes testing.  Pending lab results, we will plan to increase diuretics to prevent recurrent ascites.  Strict low-sodium diet.  More than 2000 mg per 24 hours.  Referred to [facility] for liver transplant evaluation.  Follow-up in 4 weeks  or sooner if needed.   Ermalinda Memos, PA-C [facility]  Gastroenterology 06/12/2023

## 2023-06-12 NOTE — H&P (View-Only) (Signed)
 Referring Provider: Benita Stabile, MD Primary Care Physician:  Benita Stabile, MD Primary GI Physician: Dr. Marletta Lor  Chief Complaint  Patient presents with   Follow-up    Stomach is swollen and hemorrhoids.     HPI:   Candice Stanley is a 57 y.o. female with history of type 2 diabetes, HTN, HLD, hypothyroidism, GERD with esophagitis, constipation, NASH cirrhosis with thrombocytopenia, esophageal varices, and ascites,  presenting today for further evaluation of abdominal pain and rectal pain.   Previously seen on 05/30/23 for abdominal pain/abdominal distention.  Patient reported new onset abdominal swelling over the last few days, weight was up 20 pounds.  She has been drinking quite a bit of pickle juice due to leg cramps. Some nausea. Worse after spicy/fried food.  Also with dyspnea on exertion.  Her labs were updated with no acute normalities.  Abdominal ultrasound with nodular liver, splenomegaly, moderate ascites.  Paracentesis 11/20 yielding 2.8 L, negative for SBP.  Seen in the office for reevaluation of abdominal pain on 11/22.  She reported fairly immediate improvement in her abdominal pain/bloating as well as dyspnea after paracentesis, but abdominal discomfort/bloating and nausea returned the same day after eating a vegetable plate.  Aside from these acute symptoms, she reported having new onset daily trouble with nausea, lack of appetite, intermittent bloating since starting Mounjaro a few months ago.  Prior to this, she had no trouble but she preferred to continue [facility]  Advised that she could discuss changing medications with PCP.  Held off on changing diuretics as I was hopeful that dietary changes would prevent recurrent ascites.  She was also dealing with significant rectal discomfort and intermittent toilet tissue hematochezia in the setting of hemorrhoids.  Also with perianal and buttock erythema with suspected fungal component.  She was prescribed [facility] hemorrhoid  cream with lidocaine for hemorrhoids and recommended applying Desitin and clotrimazole to the erythematous areas on her buttock.  Patient sent a message on 11/25 reporting her abdominal pain had returned, worsened postprandially.  She was scheduled for a CT of her abdomen and pelvis with contrast which was completed on 11/27 showing small to moderate volume ascites, portal colopathy, no acute findings.  Patient presented to the emergency room on 11/28 due to ongoing rectal pain.  CT of her pelvis was negative for any acute findings.  Laboratory evaluation remarkable for hemoglobin of 10.0, down from 11.2, 9 days prior.  She was again found to have hemorrhoids on exam.  She was prescribed lidocaine ointment, Percocet, clotrimazole-betamethasone cream, Anusol suppository, and Colace.  Spoke with patient today.  She reports ongoing rectal pain, abdominal distention, abdominal pain, worse with eating.  Also with ongoing fatigue, dyspnea on exertion.  Recommended returning to the office for further evaluation.  Today:  Stopped taking Mounjaro for almost 4 weeks. Continue with intermittent upper abdominal pain. Sometimes worse with food. Avoid fried foods. Some intermittent nausea, but no vomiting. Using zofran prn.   NSAIDs: None.   Abdomen is swollen. Also with dyspnea on minimal exertion, lack of energy.   Continues with severe rectal pain, unrelenting. Noting is helping. Continues with fecal seepage. Having 1 complete BM daily but then with fecal seepage. Hasn't needed lactulose. Intermittent toilet tissue hematochezia. No blood in the stool or in toilet water.   Hasn't taken any percocet (prescribed by the ER) for the rectal pain.    Last EGD 09/23/2022: Grade 1 esophageal varices, portal hypertensive gastropathy.  Last colonoscopy 09/23/2022 with nonbleeding  internal hemorrhoids, diverticulosis in the sigmoid colon, two 4-5 mm polyps in the sigmoid colon resected and retrieved, stool in the entire  examined colon that this was able to be cleared with fair visualization.  Pathology with tubular adenomas.  Recommended 5-year surveillance.  Past Medical History:  Diagnosis Date   Anxiety    Cirrhosis (HCC)    Depression    GERD (gastroesophageal reflux disease)    with grade d esophagitis in March 2023.   Hypercholesteremia    Hypertension    Trimalleolar fracture of ankle, closed, right, sequela    Type 2 diabetes mellitus (HCC)     Past Surgical History:  Procedure Laterality Date   ABDOMINAL HYSTERECTOMY     BIOPSY  09/21/2021   Procedure: BIOPSY;  Surgeon: Dolores Frame, MD;  [facility];  Service: Gastroenterology;;   CARDIAC CATHETERIZATION N/A 09/10/2015   Procedure: Left Heart Cath and Coronary Angiography;  Surgeon: Corky Crafts, MD;  Location: [facility] INVASIVE CV LAB;  Service: Cardiovascular;  Laterality: N/A;   CHOLECYSTECTOMY     COLONOSCOPY WITH PROPOFOL N/A 09/23/2022   Procedure: COLONOSCOPY WITH PROPOFOL;  Surgeon: Lanelle Bal, DO;  Location: [facility];  Service: Endoscopy;  Laterality: N/A;  1:15 pn, asa 3   ESOPHAGEAL BRUSHING  09/21/2021   Procedure: ESOPHAGEAL BRUSHING;  Surgeon: Marguerita Merles, Reuel Boom, MD;  Location: [facility];  Service: Gastroenterology;;   ESOPHAGOGASTRODUODENOSCOPY (EGD) WITH PROPOFOL N/A 09/21/2021   Surgeon: Dolores Frame, MD;  grade D esophagitis with scant bleeding, KOH negative, could not rule out small varices, but no medium to large varices, mild portal hypertensive gastropathy.  Pathology showed mild foveolar hyperplasia, negative for H. pylori.  Recommended repeat EGD in 3 months.   ESOPHAGOGASTRODUODENOSCOPY (EGD) WITH PROPOFOL N/A 09/23/2022   Surgeon: Lanelle Bal, DO; grade 1 esophageal varices, portal hypertensive gastropathy.  Recommended considering nonselective beta-blocker for primary prophylaxis.   OPEN REDUCTION INTERNAL FIXATION (ORIF) TIBIA/FIBULA FRACTURE  Right 08/04/2016   Procedure: OPEN REDUCTION INTERNAL FIXATION (ORIF) RIGHT TRIMALLEOLAR FRACTURE;  Surgeon: Toni Arthurs, MD;  Location: [facility];  Service: Orthopedics;  Laterality: Right;   POLYPECTOMY  09/23/2022   Procedure: POLYPECTOMY INTESTINAL;  Surgeon: Lanelle Bal, DO;  Location: [facility];  Service: Endoscopy;;   WRIST SURGERY Left    ganglion cyst    Current Outpatient Medications  Medication Sig Dispense Refill   calcium carbonate (TUMS - DOSED IN MG ELEMENTAL CALCIUM) 500 MG chewable tablet Chew 2 tablets by mouth daily as needed for indigestion or heartburn.     Cholecalciferol (VITAMIN D3) 50 MCG (2000 UT) TABS Take 2,000 Units by mouth daily.     clotrimazole-betamethasone (LOTRISONE) cream Apply to affected area 2 times daily prn 15 g 0   docusate sodium (COLACE) 100 MG capsule Take 1 capsule (100 mg total) by mouth every 12 (twelve) hours. 20 capsule 0   FLUoxetine (PROZAC) 40 MG capsule Take 40 mg by mouth daily.     furosemide (LASIX) 40 MG tablet Take 1 tablet (40 mg total) by mouth daily. 30 tablet 3   hydrocortisone (ANUSOL-HC) 25 MG suppository Place 1 suppository (25 mg total) rectally 2 (two) times daily. For 7 days 14 suppository 0   lactulose (CHRONULAC) 10 GM/15ML solution Take 30 mLs (20 g total) by mouth daily as needed for mild constipation. Goal of 2-3 soft bowel movements daily. 946 mL 3   lidocaine (XYLOCAINE) 5 % ointment Apply 1 Application  topically as needed. 35.44 g 0   loratadine (CLARITIN) 10 MG tablet Take 10 mg by mouth daily.     metFORMIN (GLUCOPHAGE) 500 MG tablet Take 500 mg by mouth 2 (two) times daily with a meal.     nadolol (CORGARD) 20 MG tablet Take 1 tablet (20 mg total) by mouth daily. 30 tablet 3   ondansetron (ZOFRAN-ODT) 4 MG disintegrating tablet 4mg  ODT q4 hours prn nausea/vomit 10 tablet 0   oxyCODONE-acetaminophen (PERCOCET) 5-325 MG tablet Take 1-2 tablets by mouth every 4 (four) hours as needed. 10  tablet 0   oxyCODONE-acetaminophen (PERCOCET/ROXICET) 5-325 MG tablet Take 1 tablet by mouth every 6 (six) hours as needed for severe pain (pain score 7-10). 6 tablet 0   pantoprazole (PROTONIX) 40 MG tablet TAKE 1 TABLET BY MOUTH TWICE DAILY BEFORE A MEAL 60 tablet 5   spironolactone (ALDACTONE) 100 MG tablet Take 1 tablet (100 mg total) by mouth daily. 30 tablet 3   TYLENOL 500 MG tablet Take 1 tablet (500 mg total) by mouth every 6 (six) hours as needed for mild pain (pain score 1-3) or headache. 30 tablet 0   vitamin C (ASCORBIC ACID) 500 MG tablet Take 500 mg by mouth daily.      zinc gluconate 50 MG tablet Take 50 mg by mouth daily.     amLODipine (NORVASC) 2.5 MG tablet Take 1 tablet (2.5 mg total) by mouth daily. 30 tablet 1   MOUNJARO 10 MG/0.5ML Pen Inject 10 mg into the skin once a week. (Patient not taking: Reported on 06/12/2023)     No current facility-administered medications for this visit.    Allergies as of 06/12/2023 - Review Complete 06/12/2023  Allergen Reaction Noted   Hydromorphone Nausea And Vomiting and Other (See Comments) 04/26/2023   Vicodin [hydrocodone-acetaminophen] Nausea And Vomiting and Other (See Comments) 03/05/2012   Codeine Nausea And Vomiting 11/03/2009   Hydrocodone Nausea And Vomiting and Other (See Comments) 04/26/2023   Onion Other (See Comments) 04/26/2023   Tape Swelling and Other (See Comments) 02/21/2015   Latex Rash 12/06/2016    Family History  Problem Relation Age of Onset   Hypertension Mother    Cirrhosis Mother        NASH   Coronary artery disease Father    Heart disease Father    Barrett's esophagus Father    Coronary artery disease Paternal Uncle    Coronary artery disease Cousin    Colon cancer Neg Hx     Social History   Socioeconomic History   Marital status: Married    Spouse name: Not on file   Number of children: 2   Years of education: Not on file   Highest education level: Not on file  Occupational History    Occupation: property Geologist, engineering: [employer]  Tobacco Use   Smoking status: Former    Current packs/day: 0.00    Average packs/day: 0.3 packs/day for 10.0 years (2.5 ttl pk-yrs)    Types: Cigarettes    Start date: 05/19/1981    Quit date: 05/20/1991    Years since quitting: 32.0   Smokeless tobacco: Never   Tobacco comments:    Used to smoke a pack in 1-2 weeks/ 1992  Vaping Use   Vaping status: Never Used  Substance and Sexual Activity   Alcohol use: Not Currently   Drug use: No   Sexual activity: Yes    Birth control/protection: Surgical  Other Topics Concern  Not on file  Social History Narrative   Not on file   Social Determinants of Health   Financial Resource Strain: Not on file  Food Insecurity: No Food Insecurity (04/26/2023)   Hunger Vital Sign    Worried About Running Out of Food in the Last Year: Never true    Ran Out of Food in the Last Year: Never true  Transportation Needs: No Transportation Needs (04/26/2023)   PRAPARE - Administrator, Civil Service (Medical): No    Lack of Transportation (Non-Medical): No  Physical Activity: Not on file  Stress: Not on file  Social Connections: Not on file    Review of Systems: Gen: Denies fever, chills, cold or flu like symptoms. Admits to low energy.  CV: Denies chest pain, palpitations. Resp: Admits to SOB with little activity. No cough.  GI: See HPI Derm: Denies rash. Heme: See HPI  Physical Exam: BP (!) 144/85 (BP Location: Right Arm, Patient Position: Standing)   Pulse (!) 120   Temp 97.9 F (36.6 C) (Temporal)   Ht 5\' 4"  (1.626 m)   Wt 295 lb (133.8 kg)   BMI 50.64 kg/m  General:   Alert and oriented. No distress noted. Pleasant and cooperative.  Head:  Normocephalic and atraumatic. Eyes:  Conjuctiva clear without scleral icterus. Heart:  S1, S2 present without murmurs appreciated. Lungs:  Clear to auscultation bilaterally. No wheezes, rales, or rhonchi. No distress.  Abdomen:   +BS, distended and tense, upper abdomen more prominent than lower abdomen, non-tender. Abdominal wall pitting edema present.  Rectal: Large hemorrhoid externally on the posterior left side. Possible anterior anal fissure at 12 o'clock position though very difficult to see.  Significant erythema along with excoriations as well as multiple, small demarcated ulcerations perianally and with erythema extending back through the butt cheeks.  Msk:  Symmetrical without gross deformities. Normal posture. Extremities:  With 1+ bilateral LE edema.  Neurologic:  Alert and  oriented x4 Psych:  Normal mood and affect.    Assessment:  57 y.o. female with history of type 2 diabetes, HTN, HLD, hypothyroidism, GERD with esophagitis, constipation, NASH cirrhosis with thrombocytopenia, esophageal varices, and ascites,  presenting today for further evaluation of abdominal pain and rectal pain.  Rectal pain/hemorrhoids/possible anal fissure/perianal skin ulceration: Severe rectal pain for the last couple of weeks in the setting of large external hemorrhoid that appears to be purple at this point, but not firm, possibly developing thrombosis vs resolving. Also with possible anterior anal fissure but very difficult to see. At this point, she is failing medical management of her hemorrhoids. I will plan to start her on a hemorrhoid cream compounded with 0.125% nitroglycerine for possible anal fissure and refer her to general surgery asap. I am not sure she is a great candidate for surgery in the setting of cirrhosis, but as she has failed medical management, we will see what surgery has to offer. Case was discussed with Dr. Jena Gauss as Dr. Marletta Lor not in the office, patient likely needs good rectal exam under general surgery with possible hemorrhoidectomy +/- sphincterotomy depending on if she has any improvement with nitroglycerine. Her colonoscopy is up to date in March 2024 with internal hemorrhoids and 2 tubular adenomas. No  rectal varices at that time.   Perianal skin ulceration/erythema:  Likely secondary to fecal seepage in the setting of hemorrhoids. Initially with significant erythema with few excoriations when seen in the office last week, but now with what look like several small ulcerations  perianally and on the posterior labia. Unable to rule out underlying herpes, but suspect this is primarily skin breakdown from constant moisture to the area. Recommended continuing Lotrisone cream 2-3 times a day as prescribed by the ER and overlay this with a thick layer of Desitin to protect the skin.  Also recommended seeing PCP for consideration of herpes testing.  Upper abdominal pain/nausea:  Etiology unclear. Gershon Cull thought this was secondary to [facility], but stopped Mounjaro close to 4 weeks ago and continues to have intermittent symptoms. Denies NSAIDs. Chronically on Protonix BID. However, I do note a recent decline in her hemoglobin from 11.2 on 11/19 to 10.0 on 11/28.  At this point, we will proceed with EGD for further evaluation.   Anemia:  Recent decline in her hemoglobin from 11.2 on 11/19 to 10.0 on 11/28. Denies melena. She is having intermittent toilet tissue hematochezia in the setting of hemorrhoids and possible anal fissure as per above. EGD and colonoscopy on file in March 2024 with grade 1 esophageal varices, PHG, non-bleeding internal hemorrhoids, 2 small tubular adenomas. May have oozing from PHG, but planning to update EGD in the near future for evaluation of upper abdominal pain and nausea. Will recheck CBC and check iron panel today. No B12 or folate deficiency on recent labs.   Cirrhosis with ascites:  Complicated by thrombocytopenia, esophageal varices on nadolol, and now with ascites undergoing first ever paracentesis on 11/20 yielding 2.7 L with no evidence of SBP.  Suspected ascites was secondary to dietary indiscretion, but unfortunately patient has developed recurrent abdominal distention over  the last week and is in need of repeat paracentesis.  She reports compliance with current diuretic regimen of spironolactone 100 mg daily and Lasix 40 mg daily along with trying to follow a low-sodium diet.  Recent labs on 11/28 with slight hypokalemia of 3.4.  Will plan to repeat labs today to recheck potassium and increase diuretics thereafter.   MELD score has been increasing recently, MELD was 20 on 11/28.  We discussed referral to start transplant evaluation and she would like to be seen by [facility].  Will get this referral started.  HCC screening is up-to-date 05/31/2023. EGD on file March 2024 with grade 1 varices, portal hypertensive gastropathy.   Plan:  CBC, BMP, iron panel.   Paracentesis stat  Labs including body fluid cell count, Gram stain, culture.  25 g of 25% IV albumin to be given at the start of para.  Max 5L  Proceed with upper endoscopy with propofol by Dr. Marletta Lor in near future. The risks, benefits, and alternatives have been discussed with the patient in detail. The patient states understanding and desires to proceed.  ASA 3 No AM diabetes medications.  No Mounjaro x 1 week prior if resumed prior to EGD.   Refer to general surgery ASAP for hemorrhoids failing medical management, possible anal fissure.  Start Benefiber 3 teaspoons daily x 1 week, then increase to twice daily.  Start [facility] hemorrhoid cream compounded with 0.125% nitroglycerin 3-4 times daily.  Continue Lotrisone cream 2-3 times a day as prescribed by the ER and overlay this with a thick layer of Desitin to protect the skin.    Recommended seeing PCP for consideration of herpes testing.  Pending lab results, we will plan to increase diuretics to prevent recurrent ascites.  Strict low-sodium diet.  More than 2000 mg per 24 hours.  Referred to [facility] for liver transplant evaluation.  Follow-up in 4 weeks  or sooner if needed.   Ermalinda Memos, PA-C [facility]  Gastroenterology 06/12/2023

## 2023-06-13 ENCOUNTER — Encounter: Payer: Self-pay | Admitting: *Deleted

## 2023-06-14 ENCOUNTER — Encounter: Payer: Self-pay | Admitting: *Deleted

## 2023-06-14 NOTE — Telephone Encounter (Signed)
No you didn't but patient had made a comment she didn't want [facility] office yesterday so that is why was sent to [facility].

## 2023-06-14 NOTE — Telephone Encounter (Signed)
Communication noted. If not able to see [facility] ASAP, Dr. Lovell Sheehan is fine. I don't think I specified [facility].

## 2023-06-14 NOTE — Telephone Encounter (Signed)
Noted. Thanks.

## 2023-06-15 DIAGNOSIS — K649 Unspecified hemorrhoids: Secondary | ICD-10-CM

## 2023-06-15 LAB — PATHOLOGIST SMEAR REVIEW

## 2023-06-15 NOTE — Telephone Encounter (Signed)
Mindy, we are going to need to send a referral to general surgery here in [place] ASAP.

## 2023-06-15 NOTE — Telephone Encounter (Signed)
error 

## 2023-06-16 NOTE — Telephone Encounter (Signed)
Referral sent as urgent to [facility]

## 2023-06-17 LAB — CULTURE, BODY FLUID W GRAM STAIN -BOTTLE
Culture: NO GROWTH
Special Requests: ADEQUATE

## 2023-06-19 ENCOUNTER — Other Ambulatory Visit: Payer: Self-pay | Admitting: Gastroenterology

## 2023-06-19 ENCOUNTER — Other Ambulatory Visit: Payer: Self-pay | Admitting: *Deleted

## 2023-06-19 ENCOUNTER — Encounter: Payer: Self-pay | Admitting: *Deleted

## 2023-06-19 DIAGNOSIS — R110 Nausea: Secondary | ICD-10-CM

## 2023-06-19 DIAGNOSIS — K6289 Other specified diseases of anus and rectum: Secondary | ICD-10-CM

## 2023-06-19 DIAGNOSIS — R188 Other ascites: Secondary | ICD-10-CM

## 2023-06-19 DIAGNOSIS — K649 Unspecified hemorrhoids: Secondary | ICD-10-CM

## 2023-06-19 MED ORDER — OXYCODONE-ACETAMINOPHEN 5-325 MG PO TABS: 1.0000 | ORAL_TABLET | Freq: Two times a day (BID) | ORAL | 0 refills | Status: DC | PRN

## 2023-06-19 MED ORDER — ONDANSETRON 4 MG PO TBDP: 4.0000 mg | ORAL_TABLET | Freq: Three times a day (TID) | ORAL | 1 refills | Status: AC | PRN

## 2023-06-19 NOTE — Telephone Encounter (Signed)
Please arrange paracentesis ASAP for this patient.   Max 6L Albumin per protocol Labs: body fluid cell count, culture, and gram stain

## 2023-06-19 NOTE — Telephone Encounter (Signed)
[name], do we need to do anything to make sure Candice Stanley has the BMP I ordered last week? Can you update patient/daughter on this?

## 2023-06-20 ENCOUNTER — Other Ambulatory Visit (HOSPITAL_COMMUNITY): Payer: Self-pay | Admitting: Family Medicine

## 2023-06-20 DIAGNOSIS — Z1231 Encounter for screening mammogram for malignant neoplasm of breast: Secondary | ICD-10-CM

## 2023-06-21 ENCOUNTER — Other Ambulatory Visit: Payer: Self-pay

## 2023-06-21 ENCOUNTER — Ambulatory Visit (HOSPITAL_COMMUNITY)
Admission: RE | Admit: 2023-06-21 | Discharge: 2023-06-21 | Disposition: A | Discharge status: Home / Self Care | Payer: Medicaid Other | Source: Ambulatory Visit | Attending: Gastroenterology | Admitting: Gastroenterology

## 2023-06-21 ENCOUNTER — Other Ambulatory Visit (HOSPITAL_COMMUNITY): Payer: Self-pay | Admitting: Family Medicine

## 2023-06-21 ENCOUNTER — Encounter (HOSPITAL_COMMUNITY): Payer: Self-pay

## 2023-06-21 ENCOUNTER — Encounter (HOSPITAL_COMMUNITY)
Admission: RE | Admit: 2023-06-21 | Discharge: 2023-06-21 | Disposition: A | Discharge status: Home / Self Care | Payer: Medicaid Other | Source: Ambulatory Visit | Attending: Internal Medicine

## 2023-06-21 DIAGNOSIS — N6459 Other signs and symptoms in breast: Secondary | ICD-10-CM

## 2023-06-21 DIAGNOSIS — R188 Other ascites: Secondary | ICD-10-CM | POA: Insufficient documentation

## 2023-06-21 LAB — GRAM STAIN

## 2023-06-21 LAB — BODY FLUID CELL COUNT WITH DIFFERENTIAL
Eos, Fluid: 0 %
Lymphs, Fluid: 52 %
Monocyte-Macrophage-Serous Fluid: 24 % — ABNORMAL LOW (ref 50–90)
Neutrophil Count, Fluid: 24 % (ref 0–25)
Total Nucleated Cell Count, Fluid: 300 uL (ref 0–1000)

## 2023-06-21 MED ORDER — ALBUMIN HUMAN 25 % IV SOLN
0.0000 g | Freq: Once | INTRAVENOUS | Status: AC
  Administered 2023-06-21: 25 g via INTRAVENOUS
  Filled 2023-06-21: qty 400

## 2023-06-21 MED ORDER — ALBUMIN HUMAN 25 % IV SOLN
INTRAVENOUS | Status: AC
  Filled 2023-06-21: qty 200

## 2023-06-21 NOTE — Patient Instructions (Signed)
Candice Stanley  06/21/2023     @PREFPERIOPPHARMACY @   Your procedure is scheduled on  06/26/2023.   Report to [facility] at  1245  P.M.   Call this number if you have problems the morning of surgery:  [phone]  If you experience any cold or flu symptoms such as cough, fever, chills, shortness of breath, etc. between now and your scheduled surgery, please notify us at the above number.   Remember:         Your last dose of mounjaro should have been 06/18/2023 or before.    Follow the diet instructions given to you by the office.    You may drink clear liquids until 1045 am on 06/26/2023.          Clear liquids allowed are:                    Water, Juice (No red color; non-citric and without pulp; diabetics please choose diet or no sugar options), Carbonated beverages (diabetics please choose diet or no sugar options), Clear Tea (No creamer, milk, or cream, including half & half and powdered creamer), Black Coffee Only (No creamer, milk or cream, including half & half and powdered creamer), and Clear Sports drink (No red color; diabetics please choose diet or no sugar options)    Take these medicines the morning of surgery with A SIP OF WATER                        fluoxetine, nadolol, pantoprazole.    Do not wear jewelry, make-up or nail polish, including gel polish,  artificial nails, or any other type of covering on natural nails (fingers and  toes).  Do not wear lotions, powders, or perfumes, or deodorant.  Do not shave 48 hours prior to surgery.  Men may shave face and neck.  Do not bring valuables to the hospital.  [facility] is not responsible for any belongings or valuables.  Contacts, dentures or bridgework may not be worn into surgery.  Leave your suitcase in the car.  After surgery it may be brought to your room.  For patients admitted to the hospital, discharge time will be determined by your treatment team.  Patients discharged the day of surgery  will not be allowed to drive home and must have someone with them for 24 hours.    Special instructions:   DO NOT smoke tobacco or vape for 24 hours before your procedure.  Please read over the following fact sheets that you were given. Anesthesia Post-op Instructions and Care and Recovery After Surgery      Upper Endoscopy, Adult, Care After After the procedure, it is common to have a sore throat. It is also common to have: Mild stomach pain or discomfort. Bloating. Nausea. Follow these instructions at home: The instructions below may help you care for yourself at home. Your health care provider may give you more instructions. If you have questions, ask your health care provider. If you were given a sedative during the procedure, it can affect you for several hours. Do not drive or operate machinery until your health care provider says that it is safe. If you will be going home right after the procedure, plan to have a responsible adult: Take you home from the hospital or clinic. You will not be allowed to drive. Care for you for the time you are told. Follow instructions from  your health care provider about what you may eat and drink. Return to your normal activities as told by your health care provider. Ask your health care provider what activities are safe for you. Take over-the-counter and prescription medicines only as told by your health care provider. Contact a health care provider if you: Have a sore throat that lasts longer than one day. Have trouble swallowing. Have a fever. Get help right away if you: Vomit blood or your vomit looks like coffee grounds. Have bloody, black, or tarry stools. Have a very bad sore throat or you cannot swallow. Have difficulty breathing or very bad pain in your chest or abdomen. These symptoms may be an emergency. Get help right away. Call 911. Do not wait to see if the symptoms will go away. Do not drive yourself to the  hospital. Summary After the procedure, it is common to have a sore throat, mild stomach discomfort, bloating, and nausea. If you were given a sedative during the procedure, it can affect you for several hours. Do not drive until your health care provider says that it is safe. Follow instructions from your health care provider about what you may eat and drink. Return to your normal activities as told by your health care provider. This information is not intended to replace advice given to you by your health care provider. Make sure you discuss any questions you have with your health care provider. Document Revised: 10/06/2021 Document Reviewed: 10/06/2021 Elsevier Patient Education  2024 [employer] Monitored Anesthesia Care, Care After The following information offers guidance on how to care for yourself after your procedure. Your health care provider may also give you more specific instructions. If you have problems or questions, contact your health care provider. What can I expect after the procedure? After the procedure, it is common to have: Tiredness. Little or no memory about what happened during or after the procedure. Impaired judgment when it comes to making decisions. Nausea or vomiting. Some trouble with balance. Follow these instructions at home: For the time period you were told by your health care provider:  Rest. Do not participate in activities where you could fall or become injured. Do not drive or use machinery. Do not drink alcohol. Do not take sleeping pills or medicines that cause drowsiness. Do not make important decisions or sign legal documents. Do not take care of children on your own. Medicines Take over-the-counter and prescription medicines only as told by your health care provider. If you were prescribed antibiotics, take them as told by your health care provider. Do not stop using the antibiotic even if you start to feel better. Eating and  drinking Follow instructions from your health care provider about what you may eat and drink. Drink enough fluid to keep your urine pale yellow. If you vomit: Drink clear fluids slowly and in small amounts as you are able. Clear fluids include water, ice chips, low-calorie sports drinks, and fruit juice that has water added to it (diluted fruit juice). Eat light and bland foods in small amounts as you are able. These foods include bananas, applesauce, rice, lean meats, toast, and crackers. General instructions  Have a responsible adult stay with you for the time you are told. It is important to have someone help care for you until you are awake and alert. If you have sleep apnea, surgery and some medicines can increase your risk for breathing problems. Follow instructions from your health care provider about wearing your sleep device: When  you are sleeping. This includes during daytime naps. While taking prescription pain medicines, sleeping medicines, or medicines that make you drowsy. Do not use any products that contain nicotine or tobacco. These products include cigarettes, chewing tobacco, and vaping devices, such as e-cigarettes. If you need help quitting, ask your health care provider. Contact a health care provider if: You feel nauseous or vomit every time you eat or drink. You feel light-headed. You are still sleepy or having trouble with balance after 24 hours. You get a rash. You have a fever. You have redness or swelling around the IV site. Get help right away if: You have trouble breathing. You have new confusion after you get home. These symptoms may be an emergency. Get help right away. Call 911. Do not wait to see if the symptoms will go away. Do not drive yourself to the hospital. This information is not intended to replace advice given to you by your health care provider. Make sure you discuss any questions you have with your health care provider. Document Revised:  11/22/2021 Document Reviewed: 11/22/2021 Elsevier Patient Education  2024 [employer].

## 2023-06-21 NOTE — Procedures (Signed)
PROCEDURE SUMMARY:  Successful image-guided paracentesis from the right lateral abdomen.  Yielded 2.8 liters of clear yellow fluid.  No immediate complications.  EBL < 1 mL Patient tolerated well.   Specimen was sent for labs.  Please see imaging section of Epic for full dictation.  Villa Herb PA-C 06/21/2023 8:47 AM

## 2023-06-21 NOTE — Progress Notes (Signed)
Patient tolerated left sided paracentesis and 25G of IV albumin well today and 2.8 Liters of clear yellow ascites removed with labs collected and sent for processing. Patient verbalized understanding of discharge instructions and ambulatory at departure with no acute distress noted.

## 2023-06-22 ENCOUNTER — Ambulatory Visit (INDEPENDENT_AMBULATORY_CARE_PROVIDER_SITE_OTHER): Payer: Medicaid Other | Admitting: Surgery

## 2023-06-22 ENCOUNTER — Encounter: Payer: Self-pay | Admitting: Surgery

## 2023-06-22 VITALS — BP 134/90 | HR 75 | Temp 97.9°F | Resp 16 | Ht 64.0 in | Wt 295.0 lb

## 2023-06-22 DIAGNOSIS — K7469 Other cirrhosis of liver: Secondary | ICD-10-CM

## 2023-06-22 DIAGNOSIS — K644 Residual hemorrhoidal skin tags: Secondary | ICD-10-CM

## 2023-06-22 DIAGNOSIS — R21 Rash and other nonspecific skin eruption: Secondary | ICD-10-CM

## 2023-06-22 DIAGNOSIS — D696 Thrombocytopenia, unspecified: Secondary | ICD-10-CM | POA: Diagnosis not present

## 2023-06-22 MED ORDER — HYDROCORTISONE ACETATE 25 MG RE SUPP: 25.0000 mg | Freq: Two times a day (BID) | RECTAL | 0 refills | Status: DC

## 2023-06-23 NOTE — Progress Notes (Signed)
[facility] History and Physical  Reason for Referral: Hemorrhoids Referring Physician: Mosetta Pigeon, PA-C  Chief Complaint   New Patient (Initial Visit)     Candice Stanley is a 57 y.o. female.  HPI: Patient presents for evaluation of hemorrhoids and perianal wounds.  In October, she was admitted to the hospital with a leg infection.  She was on antibiotics during his hospitalization and was discharged home on antibiotics.  She subsequently developed significant diarrhea related to her antibiotics and began having increased pain at her known hemorrhoid.  She also began developing some sores around her anus.  She was following with GI for these hemorrhoids, and was initially prescribed suppositories and compounded hemorrhoid cream.  She was unable to use a suppository secondary to pain, and the hemorrhoid cream did not alleviate her symptoms.  When she followed up with GI again, there was concern that she might of had an anal fissure, since she was prescribed a prescription for compounded hemorrhoid cream with nitroglycerin.  When she again followed up with GI, she was advised that she should follow-up with general surgery, as she had significant worsening of the sores around her anus and her hemorrhoid.  At that time she was also advised to use Desitin cream mixed with athlete's foot cream to help the sores in the area.  She was evaluated in the emergency department for continued pain at her anus, and was prescribed Anusol suppositories and lidocaine jelly.  Since that time, her pain has slightly improved.  She has more formed bowel movements and is no longer taking the antibiotics for her infection.  She has recently been able to start using suppositories, and they seem to almost immediately come back out.  She is also doing sitz bath's daily.  Her past medical history significant for cirrhosis with ascites requiring paracentesis every couple of weeks, hypertension, diabetes, GERD,  and depression.  She denies use of blood thinning medications.  Her surgical history is significant for an exploratory laparotomy with repair of an incarcerated ventral hernia, cholecystectomy, and hysterectomy.  She denies ever having had surgery for hemorrhoids.  She will rarely consume alcohol.  She denies use of tobacco products and illicit drugs.  Past Medical History:  Diagnosis Date   Anxiety    Cirrhosis (HCC)    Depression    GERD (gastroesophageal reflux disease)    with grade d esophagitis in March 2023.   Hypercholesteremia    Hypertension    Trimalleolar fracture of ankle, closed, right, sequela    Type 2 diabetes mellitus (HCC)     Past Surgical History:  Procedure Laterality Date   ABDOMINAL HYSTERECTOMY     BIOPSY  09/21/2021   Procedure: BIOPSY;  Surgeon: Dolores Frame, MD;  [facility]: [facility];  Service: Gastroenterology;;   CARDIAC CATHETERIZATION N/A 09/10/2015   Procedure: Left Heart Cath and Coronary Angiography;  Surgeon: Corky Crafts, MD;  Location: [facility] INVASIVE CV LAB;  Service: Cardiovascular;  Laterality: N/A;   CHOLECYSTECTOMY     COLONOSCOPY WITH PROPOFOL N/A 09/23/2022   Procedure: COLONOSCOPY WITH PROPOFOL;  Surgeon: Lanelle Bal, DO;  Location: [facility];  Service: Endoscopy;  Laterality: N/A;  1:15 pn, asa 3   ESOPHAGEAL BRUSHING  09/21/2021   Procedure: ESOPHAGEAL BRUSHING;  Surgeon: Dolores Frame, MD;  Location: [facility];  Service: Gastroenterology;;   ESOPHAGOGASTRODUODENOSCOPY (EGD) WITH PROPOFOL N/A 09/21/2021   Surgeon: Marguerita Merles, Reuel Boom, MD;  grade D esophagitis with scant bleeding,  KOH negative, could not rule out small varices, but no medium to large varices, mild portal hypertensive gastropathy.  Pathology showed mild foveolar hyperplasia, negative for H. pylori.  Recommended repeat EGD in 3 months.   ESOPHAGOGASTRODUODENOSCOPY (EGD) WITH PROPOFOL N/A 09/23/2022   Surgeon: Lanelle Bal, DO; grade 1 esophageal varices, portal hypertensive gastropathy.  Recommended considering nonselective beta-blocker for primary prophylaxis.   OPEN REDUCTION INTERNAL FIXATION (ORIF) TIBIA/FIBULA FRACTURE Right 08/04/2016   Procedure: OPEN REDUCTION INTERNAL FIXATION (ORIF) RIGHT TRIMALLEOLAR FRACTURE;  Surgeon: Toni Arthurs, MD;  Location: [facility];  Service: Orthopedics;  Laterality: Right;   POLYPECTOMY  09/23/2022   Procedure: POLYPECTOMY INTESTINAL;  Surgeon: Lanelle Bal, DO;  Location: [facility];  Service: Endoscopy;;   WRIST SURGERY Left    ganglion cyst    Family History  Problem Relation Age of Onset   Hypertension Mother    Cirrhosis Mother        NASH   Coronary artery disease Father    Heart disease Father    Barrett's esophagus Father    Coronary artery disease Paternal Uncle    Coronary artery disease Cousin    Colon cancer Neg Hx     Social History   Tobacco Use   Smoking status: Former    Current packs/day: 0.00    Average packs/day: 0.3 packs/day for 10.0 years (2.5 ttl pk-yrs)    Types: Cigarettes    Start date: 05/19/1981    Quit date: 05/20/1991    Years since quitting: 32.1   Smokeless tobacco: Never   Tobacco comments:    Used to smoke a pack in 1-2 weeks/ 1992  Vaping Use   Vaping status: Never Used  Substance Use Topics   Alcohol use: Not Currently   Drug use: No    Medications: I have reviewed the patient's current medications. Allergies as of 06/22/2023       Reactions   Hydromorphone Nausea And Vomiting, Other (See Comments)   B/P dropped also   Vicodin [hydrocodone-acetaminophen] Nausea And Vomiting, Other (See Comments)   "Made me deathly sick"   Codeine Nausea And Vomiting   Hydrocodone Nausea And Vomiting, Other (See Comments)   "Made me deathly sick"   Onion Other (See Comments)   Headaches   Tape Swelling, Other (See Comments)   Rash(paper tape)   Latex Rash        Medication List         Accurate as of June 22, 2023 11:59 PM. If you have any questions, ask your nurse or doctor.          amLODipine 5 MG tablet Commonly known as: NORVASC Take 1 tablet by mouth daily. What changed: Another medication with the same name was removed. Continue taking this medication, and follow the directions you see here. Changed by: Santina Evans A Larue Lightner   ascorbic acid 500 MG tablet Commonly known as: VITAMIN C Take 500 mg by mouth daily.   calcium carbonate 500 MG chewable tablet Commonly known as: TUMS - dosed in mg elemental calcium Chew 2 tablets by mouth daily as needed for indigestion or heartburn.   clotrimazole-betamethasone cream Commonly known as: LOTRISONE Apply to affected area 2 times daily prn   docusate sodium 100 MG capsule Commonly known as: COLACE Take 1 capsule (100 mg total) by mouth every 12 (twelve) hours.   FLUoxetine 40 MG capsule Commonly known as: PROZAC Take 40 mg by mouth daily.   furosemide 40 MG tablet  Commonly known as: Lasix Take 1 tablet (40 mg total) by mouth 2 (two) times daily.   hydrocortisone 25 MG suppository Commonly known as: ANUSOL-HC Place 1 suppository (25 mg total) rectally 2 (two) times daily. For 7 days   lactulose 10 GM/15ML solution Commonly known as: CHRONULAC Take 30 mLs (20 g total) by mouth daily as needed for mild constipation. Goal of 2-3 soft bowel movements daily.   lidocaine 5 % ointment Commonly known as: XYLOCAINE Apply 1 Application topically as needed.   loratadine 10 MG tablet Commonly known as: CLARITIN Take 10 mg by mouth daily.   metFORMIN 500 MG tablet Commonly known as: GLUCOPHAGE Take 500 mg by mouth 2 (two) times daily with a meal.   Mounjaro 10 MG/0.5ML Pen Generic drug: tirzepatide Inject 10 mg into the skin once a week.   nadolol 20 MG tablet Commonly known as: CORGARD Take 1 tablet (20 mg total) by mouth daily.   ondansetron 4 MG disintegrating tablet Commonly known as:  ZOFRAN-ODT Take 1 tablet (4 mg total) by mouth every 8 (eight) hours as needed for nausea or vomiting. 4mg  ODT q4 hours prn nausea/vomit   oxyCODONE-acetaminophen 5-325 MG tablet Commonly known as: Percocet Take 1 tablet by mouth every 12 (twelve) hours as needed.   pantoprazole 40 MG tablet Commonly known as: PROTONIX TAKE 1 TABLET BY MOUTH TWICE DAILY BEFORE A MEAL   spironolactone 100 MG tablet Commonly known as: Aldactone Take 1 tablet (100 mg total) by mouth 2 (two) times daily.   TYLENOL 500 MG tablet Generic drug: acetaminophen Take 1 tablet (500 mg total) by mouth every 6 (six) hours as needed for mild pain (pain score 1-3) or headache.   Vitamin D3 50 MCG (2000 UT) Tabs Take 2,000 Units by mouth daily.   zinc gluconate 50 MG tablet Take 50 mg by mouth daily.         ROS:  Constitutional: negative for chills, fatigue, and fevers Eyes: negative for visual disturbance and pain Ears, nose, mouth, throat, and face: negative for ear drainage, sore throat, and sinus problems Respiratory: negative for cough, wheezing, and shortness of breath Cardiovascular: negative for chest pain and palpitations Gastrointestinal: positive for abdominal pain and reflux symptoms, negative for nausea and vomiting Genitourinary:negative for dysuria and frequency Integument/breast: negative for dryness and rash Hematologic/lymphatic: negative for bleeding and lymphadenopathy Musculoskeletal:negative for back pain and neck pain Neurological: negative for dizziness and tremors Endocrine: negative for temperature intolerance  Blood pressure (!) 134/90, pulse 75, temperature 97.9 F (36.6 C), temperature source Oral, resp. rate 16, height 5\' 4"  (1.626 m), weight 295 lb (133.8 kg), SpO2 95%. Physical Exam Vitals reviewed.  Constitutional:      Appearance: Normal appearance.  HENT:     Head: Normocephalic and atraumatic.  Eyes:     Extraocular Movements: Extraocular movements intact.      Pupils: Pupils are equal, round, and reactive to light.  Cardiovascular:     Rate and Rhythm: Normal rate and regular rhythm.  Pulmonary:     Effort: Pulmonary effort is normal.     Breath sounds: Normal breath sounds.  Abdominal:     General: There is distension.     Palpations: Abdomen is soft.     Tenderness: There is no abdominal tenderness.  Genitourinary:    Comments: With patient in a left lateral decubitus position, she has an external hemorrhoid skin tag at the 9 o'clock position without evidence of thrombosis, nontender to palpation; surrounding her anus,  she has multiple subcentimeter ulcerations with surrounding erythema, minimally tender to palpation Musculoskeletal:        General: Normal range of motion.     Cervical back: Normal range of motion.  Skin:    General: Skin is warm and dry.  Neurological:     General: No focal deficit present.     Mental Status: She is alert and oriented to person, place, and time.  Psychiatric:        Mood and Affect: Mood normal.        Behavior: Behavior normal.     Results: No results found for this or any previous visit (from the past 48 hours).  No results found.   Assessment & Plan:  DEVINITY WHITMORE is a 57 y.o. female who presents for evaluation of hemorrhoids and a possible anal fissure.  -We discussed treatment options for hemorrhoids.  Given her significant liver disease and thrombocytopenia, I discussed that this patient would not be a candidate for any surgical interventions for her hemorrhoids.   -Upon examination, the patient's daughter states that the area looks significantly better than the last time she had seen it several weeks ago.  I do not feel that she would need surgical intervention for her hemorrhoids regardless, as I believe that her symptoms are more related to the sores that have developed around her anus likely from diarrhea. -Given that the patient's pain related to her hemorrhoids is improving and she can  use suppositories, I recommend that we begin using Anusol suppositories twice daily.  I also want her performing sitz bath's daily and after bowel movements.  She should be using wet wipes after having bowel movements. -Continue with daily Colace, and as needed lactulose for increased constipation -Continue with Desitin cream with athlete's foot cream, as this appears to be improving her excoriations and pain -Follow up with me in 1 month if things fail to improve  All questions were answered to the satisfaction of the patient and family.  Theophilus Kinds, DO [facility] [address] [place], [place] [address] [phone] (office)

## 2023-06-26 ENCOUNTER — Encounter (HOSPITAL_COMMUNITY): Payer: Self-pay

## 2023-06-26 ENCOUNTER — Ambulatory Visit (HOSPITAL_COMMUNITY)
Admission: RE | Admit: 2023-06-26 | Discharge: 2023-06-26 | Disposition: A | Discharge status: Home / Self Care | Payer: Medicaid Other | Attending: Internal Medicine | Admitting: Internal Medicine

## 2023-06-26 ENCOUNTER — Ambulatory Visit (HOSPITAL_COMMUNITY): Payer: Medicaid Other | Admitting: Anesthesiology

## 2023-06-26 ENCOUNTER — Encounter (HOSPITAL_COMMUNITY)
Admission: RE | Disposition: A | Discharge status: Home / Self Care | Payer: Self-pay | Source: Home / Self Care | Attending: Internal Medicine

## 2023-06-26 DIAGNOSIS — K2100 Gastro-esophageal reflux disease with esophagitis, without bleeding: Secondary | ICD-10-CM | POA: Insufficient documentation

## 2023-06-26 DIAGNOSIS — K648 Other hemorrhoids: Secondary | ICD-10-CM | POA: Insufficient documentation

## 2023-06-26 DIAGNOSIS — I10 Essential (primary) hypertension: Secondary | ICD-10-CM | POA: Insufficient documentation

## 2023-06-26 DIAGNOSIS — E785 Hyperlipidemia, unspecified: Secondary | ICD-10-CM | POA: Diagnosis not present

## 2023-06-26 DIAGNOSIS — E039 Hypothyroidism, unspecified: Secondary | ICD-10-CM | POA: Insufficient documentation

## 2023-06-26 DIAGNOSIS — I8500 Esophageal varices without bleeding: Secondary | ICD-10-CM

## 2023-06-26 DIAGNOSIS — K2950 Unspecified chronic gastritis without bleeding: Secondary | ICD-10-CM | POA: Insufficient documentation

## 2023-06-26 DIAGNOSIS — D696 Thrombocytopenia, unspecified: Secondary | ICD-10-CM | POA: Insufficient documentation

## 2023-06-26 DIAGNOSIS — K766 Portal hypertension: Secondary | ICD-10-CM | POA: Insufficient documentation

## 2023-06-26 DIAGNOSIS — R188 Other ascites: Secondary | ICD-10-CM | POA: Insufficient documentation

## 2023-06-26 DIAGNOSIS — K644 Residual hemorrhoidal skin tags: Secondary | ICD-10-CM | POA: Diagnosis not present

## 2023-06-26 DIAGNOSIS — K3189 Other diseases of stomach and duodenum: Secondary | ICD-10-CM | POA: Diagnosis not present

## 2023-06-26 DIAGNOSIS — I8510 Secondary esophageal varices without bleeding: Secondary | ICD-10-CM | POA: Insufficient documentation

## 2023-06-26 DIAGNOSIS — K7581 Nonalcoholic steatohepatitis (NASH): Secondary | ICD-10-CM | POA: Diagnosis not present

## 2023-06-26 DIAGNOSIS — K5900 Constipation, unspecified: Secondary | ICD-10-CM | POA: Diagnosis not present

## 2023-06-26 DIAGNOSIS — R1010 Upper abdominal pain, unspecified: Secondary | ICD-10-CM | POA: Diagnosis present

## 2023-06-26 DIAGNOSIS — E119 Type 2 diabetes mellitus without complications: Secondary | ICD-10-CM | POA: Diagnosis not present

## 2023-06-26 DIAGNOSIS — Z7985 Long-term (current) use of injectable non-insulin antidiabetic drugs: Secondary | ICD-10-CM | POA: Diagnosis not present

## 2023-06-26 HISTORY — PX: BIOPSY: SHX5522

## 2023-06-26 HISTORY — PX: ESOPHAGOGASTRODUODENOSCOPY (EGD) WITH PROPOFOL: SHX5813

## 2023-06-26 LAB — CULTURE, BODY FLUID W GRAM STAIN -BOTTLE: Culture: NO GROWTH

## 2023-06-26 LAB — GLUCOSE, CAPILLARY: Glucose-Capillary: 104 mg/dL — ABNORMAL HIGH (ref 70–99)

## 2023-06-26 SURGERY — ESOPHAGOGASTRODUODENOSCOPY (EGD) WITH PROPOFOL
Anesthesia: Monitor Anesthesia Care

## 2023-06-26 MED ORDER — PROPOFOL 10 MG/ML IV BOLUS
INTRAVENOUS | Status: DC | PRN
  Administered 2023-06-26: 40 mg via INTRAVENOUS
  Administered 2023-06-26: 120 mg via INTRAVENOUS

## 2023-06-26 MED ORDER — LIDOCAINE HCL (PF) 2 % IJ SOLN
INTRAMUSCULAR | Status: AC
  Filled 2023-06-26: qty 5

## 2023-06-26 MED ORDER — LIDOCAINE HCL (CARDIAC) PF 100 MG/5ML IV SOSY
PREFILLED_SYRINGE | INTRAVENOUS | Status: DC | PRN
  Administered 2023-06-26: 80 mg via INTRAVENOUS

## 2023-06-26 MED ORDER — DEXMEDETOMIDINE HCL IN NACL 80 MCG/20ML IV SOLN
INTRAVENOUS | Status: DC | PRN
  Administered 2023-06-26: 8 ug via INTRAVENOUS

## 2023-06-26 MED ORDER — PHENYLEPHRINE 80 MCG/ML (10ML) SYRINGE FOR IV PUSH (FOR BLOOD PRESSURE SUPPORT)
PREFILLED_SYRINGE | INTRAVENOUS | Status: DC | PRN
  Administered 2023-06-26 (×2): 160 ug via INTRAVENOUS

## 2023-06-26 MED ORDER — LACTATED RINGERS IV SOLN: INTRAVENOUS | Status: DC

## 2023-06-26 MED ORDER — PHENYLEPHRINE 80 MCG/ML (10ML) SYRINGE FOR IV PUSH (FOR BLOOD PRESSURE SUPPORT)
PREFILLED_SYRINGE | INTRAVENOUS | Status: AC
  Filled 2023-06-26: qty 10

## 2023-06-26 MED ORDER — PROPOFOL 500 MG/50ML IV EMUL
INTRAVENOUS | Status: DC | PRN
  Administered 2023-06-26: 150 ug/kg/min via INTRAVENOUS

## 2023-06-26 NOTE — Transfer of Care (Signed)
Immediate Anesthesia Transfer of Care Note  Patient: Candice Stanley  Procedure(s) Performed: ESOPHAGOGASTRODUODENOSCOPY (EGD) WITH PROPOFOL BIOPSY  Patient Location: Short Stay  Anesthesia Type:General  Level of Consciousness: drowsy and patient cooperative  Airway & Oxygen Therapy: Patient Spontanous Breathing  Post-op Assessment: Report given to RN and Post -op Vital signs reviewed and stable  Post vital signs: Reviewed and stable  Last Vitals:  Vitals Value Taken Time  BP 91/43 06/26/23 1407  Temp 36.9 C 06/26/23 1407  Pulse 76 06/26/23 1412  Resp 21 06/26/23 1412  SpO2 94 % 06/26/23 1412  Vitals shown include unfiled device data.  Last Pain:  Vitals:   06/26/23 1407  TempSrc: Axillary  PainSc: 0-No pain      Patients Stated Pain Goal: 6 (06/26/23 1329)  Complications: No notable events documented.

## 2023-06-26 NOTE — Interval H&P Note (Signed)
History and Physical Interval Note:  06/26/2023 1:40 PM  Candice Stanley  has presented today for surgery, with the diagnosis of NAUSEA, CIRRHOSIS, UPEPR ABD PAIN.  The various methods of treatment have been discussed with the patient and family. After consideration of risks, benefits and other options for treatment, the patient has consented to  Procedure(s) with comments: ESOPHAGOGASTRODUODENOSCOPY (EGD) WITH PROPOFOL (N/A) - 245PM, ASA 3 as a surgical intervention.  The patient's history has been reviewed, patient examined, no change in status, stable for surgery.  I have reviewed the patient's chart and labs.  Questions were answered to the patient's satisfaction.     Lanelle Bal

## 2023-06-26 NOTE — Discharge Instructions (Signed)
EGD Discharge instructions Please read the instructions outlined below and refer to this sheet in the next few weeks. These discharge instructions provide you with general information on caring for yourself after you leave the hospital. Your doctor may also give you specific instructions. While your treatment has been planned according to the most current medical practices available, unavoidable complications occasionally occur. If you have any problems or questions after discharge, please call your doctor. ACTIVITY You may resume your regular activity but move at a slower pace for the next 24 hours.  Take frequent rest periods for the next 24 hours.  Walking will help expel (get rid of) the air and reduce the bloated feeling in your abdomen.  No driving for 24 hours (because of the anesthesia (medicine) used during the test).  You may shower.  Do not sign any important legal documents or operate any machinery for 24 hours (because of the anesthesia used during the test).  NUTRITION Drink plenty of fluids.  You may resume your normal diet.  Begin with a light meal and progress to your normal diet.  Avoid alcoholic beverages for 24 hours or as instructed by your caregiver.  MEDICATIONS You may resume your normal medications unless your caregiver tells you otherwise.  WHAT YOU CAN EXPECT TODAY You may experience abdominal discomfort such as a feeling of fullness or "gas" pains.  FOLLOW-UP Your doctor will discuss the results of your test with you.  SEEK IMMEDIATE MEDICAL ATTENTION IF ANY OF THE FOLLOWING OCCUR: Excessive nausea (feeling sick to your stomach) and/or vomiting.  Severe abdominal pain and distention (swelling).  Trouble swallowing.  Temperature over 101 F (37.8 C).  Rectal bleeding or vomiting of blood.   Your upper endoscopy revealed esophageal varices.  Innocent appearing.  I did not band them today.  Continue on nadolol.  Mild amount of inflammation in your stomach which  I took samples of.  We will call with these results.  Follow-up in GI office in 2 weeks which is already scheduled.   I hope you have a great rest of your week!  Hennie Duos. Marletta Lor, D.O. Gastroenterology and Hepatology [facility]

## 2023-06-26 NOTE — Op Note (Signed)
[facility] Patient Name: Candice Stanley Procedure Date: 06/26/2023 1:39 PM MRN: [id number] Date of Birth: [birth year] Attending MD: Hennie Duos. Marletta Lor , [place], [id number] CSN: [id number] Age: 57 Admit Type: Outpatient Procedure:                Upper GI endoscopy Indications:              Generalized abdominal pain, Follow-up of esophageal                            varices, Nausea Providers:                Hennie Duos. Marletta Lor, DO, Crystal Page, Lennice Sites                            Technician, Technician Referring MD:              Medicines:                See the Anesthesia note for documentation of the                            administered medications Complications:            No immediate complications. Estimated Blood Loss:     Estimated blood loss was minimal. Procedure:                Pre-Anesthesia Assessment:                           - The anesthesia plan was to use monitored                            anesthesia care (MAC).                           After obtaining informed consent, the endoscope was                            passed under direct vision. Throughout the                            procedure, the patient's blood pressure, pulse, and                            oxygen saturations were monitored continuously. The                            [id number]-H190 ([id number]) scope was introduced through the                            mouth, and advanced to the second part of duodenum.                            The upper GI endoscopy was accomplished without                            difficulty. The patient tolerated the  procedure                            well. Scope In: 1:56:00 PM Scope Out: 2:00:29 PM Total Procedure Duration: 0 hours 4 minutes 29 seconds  Findings:      Grade I varices were found in the lower third of the esophagus. One       column grade II, innocent appearing.      Mild portal hypertensive gastropathy was found in the entire examined       stomach.       Patchy mild inflammation characterized by erythema was found in the       gastric body. Biopsies were taken with a cold forceps for Helicobacter       pylori testing.      The duodenal bulb, first portion of the duodenum and second portion of       the duodenum were normal. Impression:               - Grade I esophageal varices.                           - Portal hypertensive gastropathy.                           - Gastritis. Biopsied.                           - Normal duodenal bulb, first portion of the                            duodenum and second portion of the duodenum. Moderate Sedation:      Per Anesthesia Care Recommendation:           - Patient has a contact number available for                            emergencies. The signs and symptoms of potential                            delayed complications were discussed with the                            patient. Return to normal activities tomorrow.                            Written discharge instructions were provided to the                            patient.                           - Resume previous diet.                           - Continue present medications.                           - Await pathology results.                           -  Continue on Nadolol                           - Follow up in GI office in 2 weeks (already                            scheduled). Agree with referral to transplant                            hepatology. Procedure Code(s):        --- Professional ---                           [id number], Esophagogastroduodenoscopy, flexible,                            transoral; with biopsy, single or multiple Diagnosis Code(s):        --- Professional ---                           I85.00, Esophageal varices without bleeding                           K76.6, Portal hypertension                           K31.89, Other diseases of stomach and duodenum                           K29.70, Gastritis,  unspecified, without bleeding                           R10.84, Generalized abdominal pain                           R11.0, Nausea CPT copyright 2022 American Medical Association. All rights reserved. The codes documented in this report are preliminary and upon coder review may  be revised to meet current compliance requirements. Hennie Duos. Marletta Lor, DO Hennie Duos. Marletta Lor, DO 06/26/2023 2:59:08 PM This report has been signed electronically. Number of Addenda: 0

## 2023-06-27 LAB — SURGICAL PATHOLOGY

## 2023-06-29 ENCOUNTER — Telehealth: Payer: Self-pay

## 2023-06-29 NOTE — Telephone Encounter (Signed)
Pt called wanting to know her results and also wanted to follow up on the lower part of her abdomen that you all discussed before her procedure.

## 2023-06-30 ENCOUNTER — Encounter (HOSPITAL_COMMUNITY): Payer: Self-pay | Admitting: Internal Medicine

## 2023-07-02 NOTE — Telephone Encounter (Signed)
Gastric biopsies negative for H. pylori, did show mild gastritis.  Continue current medications.  I do not know what is causing her abdominal pain.  Possible abdominal wall pain from paracenteses.  CT abdomen pelvis was reassuring.  She has follow-up visit with [place] in 1 week.  Can discuss further then.  Thank you

## 2023-07-03 ENCOUNTER — Other Ambulatory Visit: Payer: Self-pay | Admitting: *Deleted

## 2023-07-03 DIAGNOSIS — R188 Other ascites: Secondary | ICD-10-CM

## 2023-07-03 NOTE — Telephone Encounter (Signed)
Pt was made aware and verbalized understanding.  

## 2023-07-04 NOTE — Anesthesia Preprocedure Evaluation (Signed)
Anesthesia Evaluation  Patient identified by MRN, date of birth, ID band Patient awake    Reviewed: Allergy & Precautions, H&P , NPO status , Patient's Chart, lab work & pertinent test results, reviewed documented beta blocker date and time   Airway Mallampati: II  TM Distance: >3 FB Neck ROM: full    Dental no notable dental hx.    Pulmonary pneumonia, former smoker   Pulmonary exam normal breath sounds clear to auscultation       Cardiovascular Exercise Tolerance: Good hypertension,  Rhythm:regular Rate:Normal     Neuro/Psych  PSYCHIATRIC DISORDERS Anxiety Depression     Neuromuscular disease    GI/Hepatic Neg liver ROS,GERD  ,,  Endo/Other  diabetesHypothyroidism    Renal/GU negative Renal ROS  negative genitourinary   Musculoskeletal   Abdominal   Peds  Hematology  (+) Blood dyscrasia, anemia   Anesthesia Other Findings   Reproductive/Obstetrics negative OB ROS                             Anesthesia Physical Anesthesia Plan  ASA: 2  Anesthesia Plan: General   Post-op Pain Management:    Induction:   PONV Risk Score and Plan: Propofol infusion  Airway Management Planned:   Additional Equipment:   Intra-op Plan:   Post-operative Plan:   Informed Consent: I have reviewed the patients History and Physical, chart, labs and discussed the procedure including the risks, benefits and alternatives for the proposed anesthesia with the patient or authorized representative who has indicated his/her understanding and acceptance.     Dental Advisory Given  Plan Discussed with: CRNA  Anesthesia Plan Comments:        Anesthesia Quick Evaluation

## 2023-07-04 NOTE — Anesthesia Postprocedure Evaluation (Signed)
Anesthesia Post Note  Patient: Candice Stanley  Procedure(s) Performed: ESOPHAGOGASTRODUODENOSCOPY (EGD) WITH PROPOFOL BIOPSY  Patient location during evaluation: Phase II Anesthesia Type: General Level of consciousness: awake Pain management: pain level controlled Vital Signs Assessment: post-procedure vital signs reviewed and stable Respiratory status: spontaneous breathing and respiratory function stable Cardiovascular status: blood pressure returned to baseline and stable Postop Assessment: no headache and no apparent nausea or vomiting Anesthetic complications: no Comments: Late entry   No notable events documented.   Last Vitals:  Vitals:   06/26/23 1407 06/26/23 1414  BP: (!) 91/43 129/70  Pulse: 67   Resp: 20   Temp: 36.9 C   SpO2: 93%     Last Pain:  Vitals:   06/26/23 1407  TempSrc: Axillary  PainSc: 0-No pain                 Windell Norfolk

## 2023-07-07 ENCOUNTER — Ambulatory Visit (HOSPITAL_COMMUNITY): Admission: RE | Admit: 2023-07-07 | Payer: Medicaid Other | Source: Ambulatory Visit

## 2023-07-08 NOTE — Progress Notes (Unsigned)
Referring Provider: Benita Stabile, MD Primary Care Physician:  Benita Stabile, MD Primary GI Physician: Dr. Bonnetta Barry chief complaint on file.   HPI:   Candice Stanley is a 57 y.o. female with history of type 2 diabetes, HTN, HLD, hypothyroidism, GERD with esophagitis, constipation, hemorrhoids, NASH cirrhosis with thrombocytopenia, esophageal varices, and ascites, presenting today for ***   Previously seen on 05/30/23 for abdominal pain/abdominal distention.  Patient reported new onset abdominal swelling over the last few days, weight was up 20 pounds.  She has been drinking quite a bit of pickle juice due to leg cramps. Some nausea. Worse after spicy/fried food.  Also with dyspnea on exertion.  Her labs were updated with no acute normalities.  Abdominal ultrasound with nodular liver, splenomegaly, moderate ascites.  Paracentesis 11/20 yielding 2.8 L, negative for SBP.    Seen in the office for reevaluation of abdominal pain on 11/22. She reported fairly immediate improvement in her abdominal pain/bloating as well as dyspnea after paracentesis, but abdominal discomfort/bloating and nausea returned the same day after eating a vegetable plate. Aside from these acute symptoms, she reported having new onset daily trouble with nausea, lack of appetite, intermittent bloating since starting Mounjaro a few months ago. Prior to this, she had no trouble but she preferred to continue [facility]. Advised that she could discuss changing medications with PCP. Held off on changing diuretics as I was hopeful that dietary changes would prevent recurrent ascites. She was also dealing with significant rectal discomfort and intermittent toilet tissue hematochezia in the setting of hemorrhoids. Also with perianal and buttock erythema with suspected fungal component. She was prescribed [facility] hemorrhoid cream with lidocaine for hemorrhoids and recommended applying Desitin and clotrimazole to the erythematous  areas on her buttock.   Patient sent a message on 11/25 reporting her abdominal pain had returned, worsened postprandially. She was scheduled for a CT of her abdomen and pelvis with contrast which was completed on 11/27 showing small to moderate volume ascites, portal colopathy, no acute findings.   Last seen in the office 06/12/23. She had ongoing rectal pain with hemorrhoids, possible anal fissure and with perianal skin ulceration. She was prescribed hemorrhoid cream compounded with 0.125% nitroglycerine for possible anal fissure and refer red her to surgery. For skin breakdown, recommended continuing Lotrisone and overlaying with Desitin. She continued with upper abdominal pain and nausea of unclear etiology. She had stopped Mounjaro 4 weeks prior and was maintaining on PPI BID. Noted recent decline in Hgb from 11.2 to 10. Recommended updating EGD and rechecking labs. Regarding cirrhosis, she had recurrent abdominal distension. Planned for repeat paracentesis and would adjust diuretics after labs were updated. She was also referred to [facility] for liver transplant consideration due to increasing MELD.     Labs: 06/12/23: Hgb 11.1, platelets 79, Cr 0.72, Na 136, K 4.1, iron 110, saturation 35% (H), ferritin 32. Recommend increasing Lasix to 40 mg twice daily and spironolactone to 100 mg twice daily.   Paracentesis: 06/12/23: Yielding 5L, No SBP.  Paracentesis 06/21/23: Yielding 2.8L. No SBP.   EGD 06/26/23:  - Grade I esophageal varices.  - Portal hypertensive gastropathy.  - Gastritis. Biopsied.  - Normal duodenal bulb, first portion of the duodenum and second portion of the duodenum. - Pathology: Fundic type gastric mucosa showing minimal chronic gastritis with reactive epithelial changes negative for H pylori.   OV with general surgery 06/22/23: Patient not a candidate for surgical intervention. Upon rectal exam, daughter  reported the area looked much better than several weeks ago. It was felt that  most of her symptoms were realted to the sores around her anus. She was advised to use anusol suppositories BID, sitz baths daily and after Bms, wet wipes with BMs.    Today:  Upper abdominal pain and nausea:   Hemorrhoids/rectal/perianal rash and pain:   Cirrhosis:      Last colonoscopy 09/23/2022 with nonbleeding internal hemorrhoids, diverticulosis in the sigmoid colon, two 4-5 mm polyps in the sigmoid colon resected and retrieved, stool in the entire examined colon that this was able to be cleared with fair visualization. Pathology with tubular adenomas. Recommended 5-year surveillance.   Past Medical History:  Diagnosis Date   Anxiety    Cirrhosis (HCC)    Depression    GERD (gastroesophageal reflux disease)    with grade d esophagitis in March 2023.   Hypercholesteremia    Hypertension    Trimalleolar fracture of ankle, closed, right, sequela    Type 2 diabetes mellitus (HCC)     Past Surgical History:  Procedure Laterality Date   ABDOMINAL HYSTERECTOMY     BIOPSY  09/21/2021   Procedure: BIOPSY;  Surgeon: Dolores Frame, MD;  [facility];  Service: Gastroenterology;;   BIOPSY  06/26/2023   Procedure: BIOPSY;  Surgeon: Lanelle Bal, DO;  [facility];  Service: Endoscopy;;   CARDIAC CATHETERIZATION N/A 09/10/2015   Procedure: Left Heart Cath and Coronary Angiography;  Surgeon: Corky Crafts, MD;  Location: [facility] INV[facility] CV LAB;  Service: Cardiovascular;  Laterality: N/A;   CHOLECYSTECTOMY     COLONOSCOPY WITH PROPOFOL N/A 09/23/2022   Procedure: COLONOSCOPY WITH PROPOFOL;  Surgeon: Lanelle Bal, DO;  [facility];  Service: Endoscopy;  Laterality: N/A;  1:15 pn, asa 3   ESOPHAGEAL BRUSHING  09/21/2021   Procedure: ESOPHAGEAL BRUSHING;  Surgeon: Marguerita Merles, Reuel Boom, MD;  [facility];  Service: Gastroenterology;;   ESOPHAGOGASTRODUODENOSCOPY (EGD) WITH PROPOFOL N/A 09/21/2021   Surgeon:  Dolores Frame, MD;  grade D esophagitis with scant bleeding, KOH negative, could not rule out small varices, but no medium to large varices, mild portal hypertensive gastropathy.  Pathology showed mild foveolar hyperplasia, negative for H. pylori.  Recommended repeat EGD in 3 months.   ESOPHAGOGASTRODUODENOSCOPY (EGD) WITH PROPOFOL N/A 09/23/2022   Surgeon: Lanelle Bal, DO; grade 1 esophageal varices, portal hypertensive gastropathy.  Recommended considering nonselective beta-blocker for primary prophylaxis.   ESOPHAGOGASTRODUODENOSCOPY (EGD) WITH PROPOFOL N/A 06/26/2023   Procedure: ESOPHAGOGASTRODUODENOSCOPY (EGD) WITH PROPOFOL;  Surgeon: Lanelle Bal, DO;  [facility];  Service: Endoscopy;  Laterality: N/A;  245PM, ASA 3   OPEN REDUCTION INTERNAL FIXATION (ORIF) TIBIA/FIBULA FRACTURE Right 08/04/2016   Procedure: OPEN REDUCTION INTERNAL FIXATION (ORIF) RIGHT TRIMALLEOLAR FRACTURE;  Surgeon: Toni Arthurs, MD;  Location: [facility];  Service: Orthopedics;  Laterality: Right;   POLYPECTOMY  09/23/2022   Procedure: POLYPECTOMY INTESTINAL;  Surgeon: Lanelle Bal, DO;  [facility];  Service: Endoscopy;;   WRIST SURGERY Left    ganglion cyst    Current Outpatient Medications  Medication Sig Dispense Refill   amLODipine (NORVASC) 5 MG tablet Take 1 tablet by mouth daily.     calcium carbonate (TUMS - DOSED IN MG ELEMENTAL CALCIUM) 500 MG chewable tablet Chew 2 tablets by mouth daily as needed for indigestion or heartburn.     Cholecalciferol (VITAMIN D3) 50 MCG (2000 UT) TABS  Take 2,000 Units by mouth daily.     clotrimazole-betamethasone (LOTRISONE) cream Apply to affected area 2 times daily prn 15 g 0   docusate sodium (COLACE) 100 MG capsule Take 1 capsule (100 mg total) by mouth every 12 (twelve) hours. 20 capsule 0   FLUoxetine (PROZAC) 40 MG capsule Take 40 mg by mouth daily.     furosemide (LASIX) 40 MG tablet Take 1 tablet (40  mg total) by mouth 2 (two) times daily. 60 tablet 3   hydrocortisone (ANUSOL-HC) 25 MG suppository Place 1 suppository (25 mg total) rectally 2 (two) times daily. For 7 days 14 suppository 0   lactulose (CHRONULAC) 10 GM/15ML solution Take 30 mLs (20 g total) by mouth daily as needed for mild constipation. Goal of 2-3 soft bowel movements daily. 946 mL 3   lidocaine (XYLOCAINE) 5 % ointment Apply 1 Application topically as needed. 35.44 g 0   loratadine (CLARITIN) 10 MG tablet Take 10 mg by mouth daily.     metFORMIN (GLUCOPHAGE) 500 MG tablet Take 500 mg by mouth 2 (two) times daily with a meal.     MOUNJARO 10 MG/0.5ML Pen Inject 10 mg into the skin once a week. (Patient not taking: Reported on 06/12/2023)     nadolol (CORGARD) 20 MG tablet Take 1 tablet (20 mg total) by mouth daily. 30 tablet 3   ondansetron (ZOFRAN-ODT) 4 MG disintegrating tablet Take 1 tablet (4 mg total) by mouth every 8 (eight) hours as needed for nausea or vomiting. 4mg  ODT q4 hours prn nausea/vomit 30 tablet 1   oxyCODONE-acetaminophen (PERCOCET) 5-325 MG tablet Take 1 tablet by mouth every 12 (twelve) hours as needed. 10 tablet 0   pantoprazole (PROTONIX) 40 MG tablet TAKE 1 TABLET BY MOUTH TWICE DAILY BEFORE A MEAL 60 tablet 5   spironolactone (ALDACTONE) 100 MG tablet Take 1 tablet (100 mg total) by mouth 2 (two) times daily. 60 tablet 3   TYLENOL 500 MG tablet Take 1 tablet (500 mg total) by mouth every 6 (six) hours as needed for mild pain (pain score 1-3) or headache. 30 tablet 0   vitamin C (ASCORBIC ACID) 500 MG tablet Take 500 mg by mouth daily.      zinc gluconate 50 MG tablet Take 50 mg by mouth daily.     No current facility-administered medications for this visit.    Allergies as of 07/10/2023 - Review Complete 06/26/2023  Allergen Reaction Noted   Hydromorphone Nausea And Vomiting and Other (See Comments) 04/26/2023   Vicodin [hydrocodone-acetaminophen] Nausea And Vomiting and Other (See Comments)  03/05/2012   Codeine Nausea And Vomiting 11/03/2009   Hydrocodone Nausea And Vomiting and Other (See Comments) 04/26/2023   Onion Other (See Comments) 04/26/2023   Tape Swelling and Other (See Comments) 02/21/2015   Latex Rash 12/06/2016    Family History  Problem Relation Age of Onset   Hypertension Mother    Cirrhosis Mother        NASH   Coronary artery disease Father    Heart disease Father    Barrett's esophagus Father    Coronary artery disease Paternal Uncle    Coronary artery disease Cousin    Colon cancer Neg Hx     Social History   Socioeconomic History   Marital status: Married    Spouse name: Not on file   Number of children: 2   Years of education: Not on file   Highest education level: Not on file  Occupational History  Occupation: Orthoptist: [employer]  Tobacco Use   Smoking status: Former    Current packs/day: 0.00    Average packs/day: 0.3 packs/day for 10.0 years (2.5 ttl pk-yrs)    Types: Cigarettes    Start date: 05/19/1981    Quit date: 05/20/1991    Years since quitting: 32.1   Smokeless tobacco: Never   Tobacco comments:    Used to smoke a pack in 1-2 weeks/ 1992  Vaping Use   Vaping status: Never Used  Substance and Sexual Activity   Alcohol use: Not Currently   Drug use: No   Sexual activity: Yes    Birth control/protection: Surgical  Other Topics Concern   Not on file  Social History Narrative   Not on file   Social Drivers of Health   Financial Resource Strain: Not on file  Food Insecurity: No Food Insecurity (04/26/2023)   Hunger Vital Sign    Worried About Running Out of Food in the Last Year: Never true    Ran Out of Food in the Last Year: Never true  Transportation Needs: No Transportation Needs (04/26/2023)   PRAPARE - Administrator, Civil Service (Medical): No    Lack of Transportation (Non-Medical): No  Physical Activity: Not on file  Stress: Not on file  Social Connections: Not on  file    Review of Systems: Gen: Denies fever, chills, anorexia. Denies fatigue, weakness, weight loss.  CV: Denies chest pain, palpitations, syncope, peripheral edema, and claudication. Resp: Denies dyspnea at rest, cough, wheezing, coughing up blood, and pleurisy. GI: Denies vomiting blood, jaundice, and fecal incontinence.   Denies dysphagia or odynophagia. Derm: Denies rash, itching, dry skin Psych: Denies depression, anxiety, memory loss, confusion. No homicidal or suicidal ideation.  Heme: Denies bruising, bleeding, and enlarged lymph nodes.  Physical Exam: There were no vitals taken for this visit. General:   Alert and oriented. No distress noted. Pleasant and cooperative.  Head:  Normocephalic and atraumatic. Eyes:  Conjuctiva clear without scleral icterus. Heart:  S1, S2 present without murmurs appreciated. Lungs:  Clear to auscultation bilaterally. No wheezes, rales, or rhonchi. No distress.  Abdomen:  +BS, soft, non-tender and non-distended. No rebound or guarding. No HSM or masses noted. Msk:  Symmetrical without gross deformities. Normal posture. Extremities:  Without edema. Neurologic:  Alert and  oriented x4 Psych:  Normal mood and affect.    Assessment:     Plan:  ***   Ermalinda Memos, PA-C [facility] 07/10/2023

## 2023-07-10 ENCOUNTER — Ambulatory Visit (INDEPENDENT_AMBULATORY_CARE_PROVIDER_SITE_OTHER): Payer: Medicaid Other | Admitting: Gastroenterology

## 2023-07-10 ENCOUNTER — Encounter: Payer: Self-pay | Admitting: Gastroenterology

## 2023-07-10 ENCOUNTER — Ambulatory Visit (HOSPITAL_COMMUNITY)
Admission: RE | Admit: 2023-07-10 | Discharge: 2023-07-10 | Disposition: A | Discharge status: Home / Self Care | Payer: Medicaid Other | Source: Ambulatory Visit | Attending: Gastroenterology | Admitting: Gastroenterology

## 2023-07-10 ENCOUNTER — Encounter (HOSPITAL_COMMUNITY): Payer: Self-pay

## 2023-07-10 VITALS — BP 116/70 | HR 81 | Temp 98.6°F | Ht 64.0 in | Wt 264.4 lb

## 2023-07-10 DIAGNOSIS — R110 Nausea: Secondary | ICD-10-CM

## 2023-07-10 DIAGNOSIS — L290 Pruritus ani: Secondary | ICD-10-CM

## 2023-07-10 DIAGNOSIS — R188 Other ascites: Secondary | ICD-10-CM | POA: Diagnosis present

## 2023-07-10 DIAGNOSIS — I8500 Esophageal varices without bleeding: Secondary | ICD-10-CM

## 2023-07-10 DIAGNOSIS — K6289 Other specified diseases of anus and rectum: Secondary | ICD-10-CM

## 2023-07-10 DIAGNOSIS — D696 Thrombocytopenia, unspecified: Secondary | ICD-10-CM

## 2023-07-10 DIAGNOSIS — K3189 Other diseases of stomach and duodenum: Secondary | ICD-10-CM | POA: Diagnosis not present

## 2023-07-10 DIAGNOSIS — K649 Unspecified hemorrhoids: Secondary | ICD-10-CM

## 2023-07-10 DIAGNOSIS — R1010 Upper abdominal pain, unspecified: Secondary | ICD-10-CM

## 2023-07-10 DIAGNOSIS — R109 Unspecified abdominal pain: Secondary | ICD-10-CM

## 2023-07-10 DIAGNOSIS — K7460 Unspecified cirrhosis of liver: Secondary | ICD-10-CM | POA: Diagnosis not present

## 2023-07-10 LAB — BODY FLUID CELL COUNT WITH DIFFERENTIAL
Eos, Fluid: 0 %
Lymphs, Fluid: 68 %
Monocyte-Macrophage-Serous Fluid: 22 % — ABNORMAL LOW (ref 50–90)
Neutrophil Count, Fluid: 10 % (ref 0–25)
Total Nucleated Cell Count, Fluid: 396 uL (ref 0–1000)

## 2023-07-10 MED ORDER — ALBUMIN HUMAN 25 % IV SOLN
0.0000 g | Freq: Once | INTRAVENOUS | Status: AC
  Administered 2023-07-10: 25 g via INTRAVENOUS
  Filled 2023-07-10: qty 400

## 2023-07-10 MED ORDER — ALBUMIN HUMAN 25 % IV SOLN
INTRAVENOUS | Status: AC
  Filled 2023-07-10: qty 200

## 2023-07-10 NOTE — Patient Instructions (Addendum)
Lease have labs completed at [facility].  Continue following with general surgery for hemorrhoid management for now.   Continue Anusol suppositories for hemorrhoids.   Continue Desitin, over-the-counter athlete's foot cream to help with perianal skin breakdown as this seems to be helping. Continue to follow-up with your primary care doctor on this if it doesn't resolve.   Continue lasix 40 mg twice daily and spironolactone twice daily.   Continue lactulose as needed.   Nutrition:  High-protein diet from a primarily plant-based diet. Avoid red meat.  No raw or undercooked meat, seafood, or shellfish. Low-fat/cholesterol/carbohydrate diet. Limit sodium to no more than 2000 mg/day including everything that you eat and drink. Recommend at least 30 minutes of aerobic and resistance exercise 3 days/week.   I will see you back in 4 weeks or sooner if needed.   Ermalinda Memos, PA-C [facility]

## 2023-07-10 NOTE — Progress Notes (Signed)
Patient tolerated right sided paracentesis and 25G of IV albumin well today and 2.3 Liters of clear yellow ascites removed with labs collected and sent for processing. Patient verbalized understanding of discharge instructions and ambulatory at departure with no acute distress noted.

## 2023-07-11 ENCOUNTER — Telehealth: Payer: Self-pay | Admitting: *Deleted

## 2023-07-11 NOTE — Telephone Encounter (Signed)
LMOVM  with [facility] to call back, wanting to get update on referral that was sent on 06/12/23

## 2023-07-12 ENCOUNTER — Encounter: Payer: Self-pay | Admitting: Gastroenterology

## 2023-07-12 ENCOUNTER — Telehealth: Payer: Self-pay | Admitting: Gastroenterology

## 2023-07-12 NOTE — Telephone Encounter (Signed)
 Not sure why the paracentesis cell count was not in my inbasket or addressed but cell count ok, no findings to suggest infection.

## 2023-07-14 LAB — BASIC METABOLIC PANEL
BUN: 8 mg/dL (ref 7–25)
CO2: 28 mmol/L (ref 20–32)
Calcium: 9.3 mg/dL (ref 8.6–10.4)
Chloride: 104 mmol/L (ref 98–110)
Creat: 0.66 mg/dL (ref 0.50–1.03)
Glucose, Bld: 128 mg/dL — ABNORMAL HIGH (ref 65–99)
Potassium: 4.3 mmol/L (ref 3.5–5.3)
Sodium: 137 mmol/L (ref 135–146)

## 2023-07-16 ENCOUNTER — Encounter: Payer: Self-pay | Admitting: Gastroenterology

## 2023-07-20 NOTE — Telephone Encounter (Signed)
 LMOVM at [facility] for liver transplant evaluation to call with update on referral.

## 2023-07-24 ENCOUNTER — Other Ambulatory Visit: Payer: Self-pay | Admitting: Gastroenterology

## 2023-07-24 DIAGNOSIS — R4182 Altered mental status, unspecified: Secondary | ICD-10-CM

## 2023-07-24 DIAGNOSIS — R188 Other ascites: Secondary | ICD-10-CM

## 2023-07-24 NOTE — Progress Notes (Signed)
 Referring Provider: Omie Bickers, MD Primary Care Physician:  Omie Bickers, MD Primary GI Physician: Dr. Mordechai April  Chief Complaint  Patient presents with   Follow-up    Follow up.     HPI:   Candice Stanley is a 58 y.o. female with history of type 2 diabetes, HTN, HLD, hypothyroidism, GERD with esophagitis, constipation, hemorrhoids, NASH cirrhosis with thrombocytopenia, esophageal varices, and ascites, presenting today after contacting the office on 1/13 with concerns for mental status changes and recurrent ascites.   No prior hepatic encephalopathy. She has only ever taken lactulose  as needed in the past.   I spoke with patient and her daughter on the phone on 1/13. Patient felt that she needed another para. Also reported since the mid to later part of last week, patient has had some slower thought processing, tiredness, and some drawn out speech at times. No facial droop or weakness. She was alert and oriented x 4 on the phone.   Recommended checking ammonia and starting lactulose  30 ml BID. Paracentesis also scheduled and OV arranged with me Wednesday as patient requested to see me only.    Today:  Mental Status Change:  Daughter presents with patient and reports over the last couple of weeks, patient has had intermittent changes in mental status with increased tiredness, falling asleep frequently, intermittent slurred speech, low response time/speech. One day couldn't log into her phone as she couldn't get her password right. States today is a much better day, she hasn't had significant issues today. Patient feels these changes can be explained by her chronic insomnia and recent significant stress as her mother is actively dying for cirrhosis and she just lost her uncle last weekend from cirrhosis. States depression has been worse. Has follow-up with PCP next week.    Patient reports she has been taking a stool softener daily and has 2 or 3 bowel movements per day.  She has not been  taking lactulose .  No BRBPR, melena.    Ascites/peripheral edema:  Taking Lasix  40 mg twice daily and spironolactone  100 mg twice daily starting the week of 12/9.  Occasionally forgets to take the second dose.  Feels that her abdomen is mildly distended again, primarily upper abdomen.  No pain.   Scheduled for paracentesis today.  Diet:  Doing really well with low sodium diet. All cookies/sweets changed to fresh fruit. No canned foods, deli meats, frozen foods.  Stop drinking soda.  Only drinks 2 cups of V8.  Wt Readings from Last 3 Encounters:  07/26/23 266 lb 3.2 oz (120.7 kg)  07/10/23 264 lb 6.4 oz (119.9 kg)  06/22/23 295 lb (133.8 kg)     [facility] referral:  Has not heard from [facility].  We have tried reaching out twice and have not heard back.  Patient states she is also been calling has not been able to get a response.  She is asking for referral to [facility].    Past Medical History:  Diagnosis Date   Anxiety    Cirrhosis (HCC)    Depression    GERD (gastroesophageal reflux disease)    with grade d esophagitis in March 2023.   Hypercholesteremia    Hypertension    Trimalleolar fracture of ankle, closed, right, sequela    Type 2 diabetes mellitus (HCC)     Past Surgical History:  Procedure Laterality Date   ABDOMINAL HYSTERECTOMY     BIOPSY  09/21/2021   Procedure: BIOPSY;  Surgeon: Urban Garden, MD;  [facility];  Service: Gastroenterology;;   BIOPSY  06/26/2023   Procedure: BIOPSY;  Surgeon: Vinetta Greening, DO;  [facility]: [facility];  Service: Endoscopy;;   CARDIAC CATHETERIZATION N/A 09/10/2015   Procedure: Left Heart Cath and Coronary Angiography;  Surgeon: Lucendia Rusk, MD;  Location: [facility];  Service: Cardiovascular;  Laterality: N/A;   CHOLECYSTECTOMY     COLONOSCOPY WITH PROPOFOL  N/A 09/23/2022   Procedure: COLONOSCOPY WITH PROPOFOL ;  Surgeon: Vinetta Greening, DO;  [facility];  Service: Endoscopy;   Laterality: N/A;  1:15 pn, asa 3   ESOPHAGEAL BRUSHING  09/21/2021   Procedure: ESOPHAGEAL BRUSHING;  Surgeon: Urban Garden, MD;  [facility];  Service: Gastroenterology;;   ESOPHAGOGASTRODUODENOSCOPY (EGD) WITH PROPOFOL  N/A 09/21/2021   Surgeon: Urban Garden, MD;  grade D esophagitis with scant bleeding, KOH negative, could not rule out small varices, but no medium to large varices, mild portal hypertensive gastropathy.  Pathology showed mild foveolar hyperplasia, negative for H. pylori.  Recommended repeat EGD in 3 months.   ESOPHAGOGASTRODUODENOSCOPY (EGD) WITH PROPOFOL  N/A 09/23/2022   Surgeon: Vinetta Greening, DO; grade 1 esophageal varices, portal hypertensive gastropathy.  Recommended considering nonselective beta-blocker for primary prophylaxis.   ESOPHAGOGASTRODUODENOSCOPY (EGD) WITH PROPOFOL  N/A 06/26/2023   Procedure: ESOPHAGOGASTRODUODENOSCOPY (EGD) WITH PROPOFOL ;  Surgeon: Vinetta Greening, DO;  [facility];  Service: Endoscopy;  Laterality: N/A;  245PM, ASA 3   OPEN REDUCTION INTERNAL FIXATION (ORIF) TIBIA/FIBULA FRACTURE Right 08/04/2016   Procedure: OPEN REDUCTION INTERNAL FIXATION (ORIF) RIGHT TRIMALLEOLAR FRACTURE;  Surgeon: Amada Backer, MD;  Location: [facility];  Service: Orthopedics;  Laterality: Right;   POLYPECTOMY  09/23/2022   Procedure: POLYPECTOMY INTESTINAL;  Surgeon: Vinetta Greening, DO;  [facility];  Service: Endoscopy;;   WRIST SURGERY Left    ganglion cyst    Current Outpatient Medications  Medication Sig Dispense Refill   amLODipine  (NORVASC ) 5 MG tablet Take 1 tablet by mouth daily.     calcium  carbonate (TUMS - DOSED IN MG ELEMENTAL CALCIUM ) 500 MG chewable tablet Chew 2 tablets by mouth daily as needed for indigestion or heartburn.     Cholecalciferol (VITAMIN D3) 50 MCG (2000 UT) TABS Take 2,000 Units by mouth daily.     docusate sodium  (COLACE) 100 MG capsule Take 1 capsule  (100 mg total) by mouth every 12 (twelve) hours. (Patient taking differently: Take 100 mg by mouth 2 (two) times daily as needed.) 20 capsule 0   FLUoxetine  (PROZAC ) 40 MG capsule Take 40 mg by mouth daily.     furosemide  (LASIX ) 40 MG tablet Take 1 tablet (40 mg total) by mouth 2 (two) times daily. 60 tablet 3   hydrocortisone  (ANUSOL -HC) 25 MG suppository Place 1 suppository (25 mg total) rectally 2 (two) times daily. For 7 days 14 suppository 0   lactulose  (CHRONULAC ) 10 GM/15ML solution Take 30 mLs (20 g total) by mouth daily as needed for mild constipation. Goal of 2-3 soft bowel movements daily. 946 mL 3   loratadine  (CLARITIN ) 10 MG tablet Take 10 mg by mouth daily.     metFORMIN (GLUCOPHAGE) 500 MG tablet Take 500 mg by mouth 2 (two) times daily with a meal.     nadolol  (CORGARD ) 20 MG tablet Take 1 tablet (20 mg total) by mouth daily. 30 tablet 3   ondansetron  (ZOFRAN -ODT) 4 MG disintegrating tablet Take 1 tablet (4 mg total) by mouth every  8 (eight) hours as needed for nausea or vomiting. 4mg  ODT q4 hours prn nausea/vomit 30 tablet 1   pantoprazole  (PROTONIX ) 40 MG tablet TAKE 1 TABLET BY MOUTH TWICE DAILY BEFORE A MEAL 60 tablet 5   spironolactone  (ALDACTONE ) 100 MG tablet Take 1 tablet (100 mg total) by mouth 2 (two) times daily. 60 tablet 3   TYLENOL  500 MG tablet Take 1 tablet (500 mg total) by mouth every 6 (six) hours as needed for mild pain (pain score 1-3) or headache. 30 tablet 0   vitamin C (ASCORBIC ACID ) 500 MG tablet Take 500 mg by mouth daily.      zinc  gluconate 50 MG tablet Take 50 mg by mouth daily.     No current facility-administered medications for this visit.    Allergies as of 07/26/2023 - Review Complete 07/26/2023  Allergen Reaction Noted   Hydromorphone  Nausea And Vomiting and Other (See Comments) 04/26/2023   Vicodin [hydrocodone -acetaminophen ] Nausea And Vomiting and Other (See Comments) 03/05/2012   Codeine Nausea And Vomiting 11/03/2009   Hydrocodone   Nausea And Vomiting and Other (See Comments) 04/26/2023   Onion Other (See Comments) 04/26/2023   Tape Swelling and Other (See Comments) 02/21/2015   Latex Rash 12/06/2016    Family History  Problem Relation Age of Onset   Hypertension Mother    Cirrhosis Mother        NASH   Coronary artery disease Father    Heart disease Father    Barrett's esophagus Father    Coronary artery disease Paternal Uncle    Coronary artery disease Cousin    Colon cancer Neg Hx     Social History   Socioeconomic History   Marital status: Legally Separated    Spouse name: Not on file   Number of children: 2   Years of education: Not on file   Highest education level: Not on file  Occupational History   Occupation: property Geologist, engineering: [employer]  Tobacco Use   Smoking status: Former    Current packs/day: 0.00    Average packs/day: 0.3 packs/day for 10.0 years (2.5 ttl pk-yrs)    Types: Cigarettes    Start date: 05/19/1981    Quit date: 05/20/1991    Years since quitting: 32.2   Smokeless tobacco: Never   Tobacco comments:    Used to smoke a pack in 1-2 weeks/ 1992  Vaping Use   Vaping status: Never Used  Substance and Sexual Activity   Alcohol use: Not Currently   Drug use: No   Sexual activity: Yes    Birth control/protection: Surgical  Other Topics Concern   Not on file  Social History Narrative   Not on file   Social Drivers of Health   Financial Resource Strain: Not on file  Food Insecurity: No Food Insecurity (04/26/2023)   Hunger Vital Sign    Worried About Running Out of Food in the Last Year: Never true    Ran Out of Food in the Last Year: Never true  Transportation Needs: No Transportation Needs (04/26/2023)   PRAPARE - Administrator, Civil Service (Medical): No    Lack of Transportation (Non-Medical): No  Physical Activity: Not on file  Stress: Not on file  Social Connections: Not on file    Review of Systems: Gen: Denies fever, chills,  cold or flulike symptoms, presyncope, syncope. GI: See HPI Psych: Admits to depression.  Heme:  See HPI  Physical Exam: BP 122/74 (BP Location:  Right Arm, Patient Position: Sitting, Cuff Size: Large)   Pulse 68   Temp 97.6 F (36.4 C) (Temporal)   Ht 5\' 4"  (1.626 m)   Wt 266 lb 3.2 oz (120.7 kg)   BMI 45.69 kg/m  General:   Alert and oriented. No distress noted. Pleasant and cooperative.  Head:  Normocephalic and atraumatic. Eyes:  Conjuctiva clear without scleral icterus. Heart:  S1, S2 present without murmurs appreciated. Lungs:  Clear to auscultation bilaterally. No wheezes, rales, or rhonchi. No distress.  Abdomen:  +BS, soft, distended, primarily upper abdomen, mild epigastric TTP.  No rebound or guarding.  Msk:  Symmetrical without gross deformities. Normal posture. Extremities:  Without edema. Neurologic:  Alert and  oriented x4.  No asterixis. Psych:  Normal mood and affect.    Assessment:  NASH cirrhosis with ascites: Complicated by thrombocytopenia, esophageal varices on nadolol , and ascites. Underwent first ever paracentesis on 11/20. She has had 3 additional para's since that time, last completed 12/30 yielding 2.3L and is currently scheduled for another paracentesis today due to mild abdominal distension though non-tense.  No Hx of SBP. Diuretics were increased to Lasix  40 mg twice daily and spironolactone  100 mg twice daily starting the week of 12/9 and weight is down 29 lbs.  No significant peripheral edema on exam today. Prior US  just prior to first ever paracentesis in November noted patent portal vein.  We have discussed TIPS but patient has requested to hold off on this for now to allow diuretics to continue to work as she has had less volume drawn off since they were increased. This may be something we have to re-consider if paracentesis continues to be required.   Notably, patient is also very concerned about her kidney function, so I will repeat a BMP today  (previously normal on 1/2).   Due to decompensated cirrhosis with increasing MELD, we placed brought to [facility] on 12/2 for liver transplant evaluation, but patient has not heard from them yet.  She has called numerous times and has not got a response.  Our office is also reached out twice with no response.  She is asking for referral to [facility] in [place].  We will place this for her today.  HCC screening is up-to-date 05/31/2023.  EGD 06/26/2023 with grade 1 esophageal varices, portal hypertensive gastropathy.    Mental status change:  Recent intermittent mental status changes over the last couple of weeks of unclear etiology.  This may be secondary to hepatic encephalopathy versus significant stress/depression/insomnia related to recent loss of her uncle to cirrhosis and her mother who is nearing death from cirrhosis.  She is alert and oriented x 4 today with no asterixis.  I will check an ammonia level for her and also go ahead and start her on lactulose  to see if this will help stabilize her mental status.   Plan:  Proceed with paracentesis as scheduled today.  Advised to hold diuretics today. Update BMP and check ammonia today. Otherwise, continue Lasix  40 mg twice daily and spironolactone  100 mg twice daily. Stop daily stool softener. Start lactulose  30 mL daily with goal of 3 bowel movements daily. Continue strict 2 g sodium per day sodium restriction. Refer to [facility] in [place]. Follow-up with PCP on depression/recent significant family stress due to loss. Follow-up with me as scheduled on 08/07/2023.   Shana Daring, PA-C [facility] 07/26/2023

## 2023-07-24 NOTE — Telephone Encounter (Signed)
 Spoke with patient as well as her daughter. Since the mid to later part of last week, patient has had some slower thought processing, tiredness, and some drawn out speech at times. No facial droop or weakness. She is alert and oriented x 4 today on the phone.   Recommended starting lactulose  30 mL twice daily and monitoring mental status.  Advised to proceed to the emergency room if any worsening mental status.  I will plan to see her in the office on Wednesday as I do not have any openings today and I work in the hospital tomorrow.  Offered an appointment with another apt, but patient requested to see me only.  We will also get her scheduled for paracentesis today or Wednesday and she will have labs completed to check her ammonia levels.   Mandy: Please arrange OV with me on Wednesday, 1/15, at 10 AM.  Tammy/Mindy: Please arrange paracentesis today or Wednesday. I have placed the orders.

## 2023-07-25 ENCOUNTER — Ambulatory Visit: Payer: Medicaid Other | Admitting: Surgery

## 2023-07-26 ENCOUNTER — Encounter: Payer: Self-pay | Admitting: Gastroenterology

## 2023-07-26 ENCOUNTER — Other Ambulatory Visit (HOSPITAL_COMMUNITY)
Admission: RE | Admit: 2023-07-26 | Discharge: 2023-07-26 | Disposition: A | Discharge status: Home / Self Care | Payer: Medicaid Other | Source: Ambulatory Visit | Attending: Gastroenterology | Admitting: Gastroenterology

## 2023-07-26 ENCOUNTER — Ambulatory Visit (HOSPITAL_COMMUNITY)
Admission: RE | Admit: 2023-07-26 | Discharge: 2023-07-26 | Disposition: A | Discharge status: Home / Self Care | Payer: Medicaid Other | Source: Ambulatory Visit | Attending: Gastroenterology | Admitting: Gastroenterology

## 2023-07-26 ENCOUNTER — Ambulatory Visit (INDEPENDENT_AMBULATORY_CARE_PROVIDER_SITE_OTHER): Payer: Medicaid Other | Admitting: Gastroenterology

## 2023-07-26 VITALS — BP 122/74 | HR 68 | Temp 97.6°F | Ht 64.0 in | Wt 266.2 lb

## 2023-07-26 DIAGNOSIS — R4182 Altered mental status, unspecified: Secondary | ICD-10-CM

## 2023-07-26 DIAGNOSIS — K7460 Unspecified cirrhosis of liver: Secondary | ICD-10-CM

## 2023-07-26 DIAGNOSIS — I8500 Esophageal varices without bleeding: Secondary | ICD-10-CM

## 2023-07-26 DIAGNOSIS — K7581 Nonalcoholic steatohepatitis (NASH): Secondary | ICD-10-CM | POA: Diagnosis not present

## 2023-07-26 DIAGNOSIS — R188 Other ascites: Secondary | ICD-10-CM

## 2023-07-26 DIAGNOSIS — D696 Thrombocytopenia, unspecified: Secondary | ICD-10-CM

## 2023-07-26 DIAGNOSIS — K3189 Other diseases of stomach and duodenum: Secondary | ICD-10-CM

## 2023-07-26 LAB — AMMONIA: Ammonia: 51 umol/L — ABNORMAL HIGH (ref 9–35)

## 2023-07-26 LAB — BASIC METABOLIC PANEL
Anion gap: 6 (ref 5–15)
BUN: 11 mg/dL (ref 6–20)
CO2: 26 mmol/L (ref 22–32)
Calcium: 9.4 mg/dL (ref 8.9–10.3)
Chloride: 101 mmol/L (ref 98–111)
Creatinine, Ser: 0.72 mg/dL (ref 0.44–1.00)
GFR, Estimated: >60 mL/min (ref >60)
Glucose, Bld: 159 mg/dL — ABNORMAL HIGH (ref 70–99)
Potassium: 4.5 mmol/L (ref 3.5–5.1)
Sodium: 133 mmol/L — ABNORMAL LOW (ref 135–145)

## 2023-07-26 MED ORDER — ALBUMIN HUMAN 25 % IV SOLN
INTRAVENOUS | Status: AC
  Filled 2023-07-26: qty 100

## 2023-07-26 NOTE — Patient Instructions (Addendum)
 Have paracentesis today as scheduled.   Have blood work completed at [facility] when you go for your paracentesis.  No fluids pills today. Otherwise, resume lasix  40 mg twice daily and spironolactone  100 mg twice daily tomorrow.  Stop daily stool softener.  Start lactulose  30 mL daily with a goal of 3-4 bowel movements daily.  Continue following a strict low-sodium diet.  More than 2000 mg of sodium per 24 hours.  I am sending a referral to Duey Ghent, NP with [facility] in [place] for transplant evaluation.  Follow-up with your primary care doctor on depression/recent significant family stress due to loss.  We will follow-up with you as scheduled on 08/07/2023.  Shana Daring, PA-C [facility]

## 2023-07-27 ENCOUNTER — Ambulatory Visit: Payer: Medicaid Other | Admitting: Surgery

## 2023-08-01 ENCOUNTER — Ambulatory Visit (HOSPITAL_COMMUNITY): Admission: RE | Admit: 2023-08-01 | Payer: Medicaid Other | Source: Ambulatory Visit

## 2023-08-01 ENCOUNTER — Encounter (HOSPITAL_COMMUNITY): Payer: Self-pay

## 2023-08-01 ENCOUNTER — Inpatient Hospital Stay (HOSPITAL_COMMUNITY): Admission: RE | Admit: 2023-08-01 | Payer: Medicaid Other | Source: Ambulatory Visit

## 2023-08-05 NOTE — Progress Notes (Unsigned)
Referring Provider: Benita Stabile, MD Primary Care Physician:  Benita Stabile, MD Primary GI Physician: Dr. Marletta Lor  Chief Complaint  Patient presents with   Follow-up    Follow up.     HPI:   Candice Stanley is a 58 y.o. female with history of type 2 diabetes, HTN, HLD, hypothyroidism, GERD with esophagitis, constipation, hemorrhoids, NASH cirrhosis with thrombocytopenia, esophageal varices, and ascites, presenting today for close 2 week follow-up hyperammoniemia and ascites.   Last seen in the office 07/26/23 with family concerns about mental status changes, increased tiredness, falling asleep frequently, intermittent slurred speech, slow response time/speech. She had not been taking lactulose as constipation was controlled with stool softeners. Regarding ascites/peripheral edema, she had been taking lasix 40 mg twice daily and spironolactone 100 mg twice daily starting the week of 12/9 though she was occasionally forgetting her second dose. Doing well with low sodium diet. Felt there abdomen was mildly distended, no pain. She was already scheduled for paracentesis later the same day, but based on exam, I wasn't sure this was needed. Planned to update BMP, ammonia, stop stool softener, start lactulose 30 ml daily with goal of 3 Bms per day, continue other current medication regimen. [facility] also [facility] as patient had not heard from [facility] despite prior referral being placed on 12/2.  [place] completed with insufficient ascites.   Labs completed showing sodium 133, potassium 4.5, creatinine 0.72, ammonia 51.    Today:  Hepatic encephalopathy:  Having 3-4 stools per day. Formed. Complete. Taking 30 ml laculose daily.  Mental status improved and back to baseline. Daughter presents with patient today who conforms mental status is at baseline.   Ascites/peripheral edema:  Last paracentesis 07/10/23.  Insufficient ascites 07/26/23. Increased abdominal distension recently. Feels  like she now needs repeat paracentesis.  Has gained 15 lbs since I saw her 2 weeks ago.   Mother passed away on 08-10-23. Has been eating food that was brought by family and friends- a lot of pasta/tomato based sauces.  Has had some mild discomfort with increasing abdominal distension. No significant pain, nausea, or vomiting.   Wt Readings from Last 4 Encounters:  08/07/23 281 lb 9.6 oz (127.7 kg)  07/26/23 266 lb 3.2 oz (120.7 kg)  07/10/23 264 lb 6.4 oz (119.9 kg)  06/22/23 295 lb (133.8 kg)    Saw [facility] this morning as they called her with a cancellation. They have recommended starting transplant evaluation and are going to call her with her first appointment for this.  They advised against TIPS.  They also stated she would need to lose 50 pounds before she could actually undergo transplant.  They recommended that she start back on Mounjaro.  They also recommended increasing nadolol to 40 mg daily.  States she had several labs drawn this morning.   Anemia- I reviewed labs completed with her primary care provider on 08/02/2023.  Hemoglobin was 10.1, normocytic indices.  WBC low at 2.6, platelets low at 56.  Bilirubin elevated at 2.6 (stable), LFTs within normal limits, creatinine, electrolytes within normal limits.  She does not see hematology. Denies brbpr, melena, or NSAID use.     EGD 06/26/23:  - Grade I esophageal varices.  - Portal hypertensive gastropathy.  - Gastritis. Biopsied.  - Normal duodenal bulb, first portion of the duodenum and second portion of the duodenum. - Pathology: Fundic type gastric mucosa showing minimal chronic gastritis with reactive epithelial changes negative for H  pylori.   Last colonoscopy 09/23/2022: Nonbleeding internal hemorrhoids, diverticulosis in the sigmoid colon, two 4-5 mm polyps in the sigmoid colon resected and retrieved, stool in the entire examined colon that this was able to be cleared with fair visualization. Pathology with tubular  adenomas. Recommended 5-year surveillance.      Past Medical History:  Diagnosis Date   Anxiety    Cirrhosis (HCC)    Depression    GERD (gastroesophageal reflux disease)    with grade d esophagitis in March 2023.   Hypercholesteremia    Hypertension    Trimalleolar fracture of ankle, closed, right, sequela    Type 2 diabetes mellitus (HCC)     Past Surgical History:  Procedure Laterality Date   ABDOMINAL HYSTERECTOMY     BIOPSY  09/21/2021   Procedure: BIOPSY;  Surgeon: Dolores Frame, MD;  [facility];  Service: Gastroenterology;;   BIOPSY  06/26/2023   Procedure: BIOPSY;  Surgeon: Lanelle Bal, DO;  [facility];  Service: Endoscopy;;   CARDIAC CATHETERIZATION N/A 09/10/2015   Procedure: Left Heart Cath and Coronary Angiography;  Surgeon: Corky Crafts, MD;  Location: [facility] INVASIVE CV LAB;  Service: Cardiovascular;  Laterality: N/A;   CHOLECYSTECTOMY     COLONOSCOPY WITH PROPOFOL N/A 09/23/2022   Procedure: COLONOSCOPY WITH PROPOFOL;  Surgeon: Lanelle Bal, DO;  [facility];  Service: Endoscopy;  Laterality: N/A;  1:15 pn, asa 3   ESOPHAGEAL BRUSHING  09/21/2021   Procedure: ESOPHAGEAL BRUSHING;  Surgeon: Marguerita Merles, Reuel Boom, MD;  [facility];  Service: Gastroenterology;;   ESOPHAGOGASTRODUODENOSCOPY (EGD) WITH PROPOFOL N/A 09/21/2021   Surgeon: Dolores Frame, MD;  grade D esophagitis with scant bleeding, KOH negative, could not rule out small varices, but no medium to large varices, mild portal hypertensive gastropathy.  Pathology showed mild foveolar hyperplasia, negative for H. pylori.  Recommended repeat EGD in 3 months.   ESOPHAGOGASTRODUODENOSCOPY (EGD) WITH PROPOFOL N/A 09/23/2022   Surgeon: Lanelle Bal, DO; grade 1 esophageal varices, portal hypertensive gastropathy.  Recommended considering nonselective beta-blocker for primary prophylaxis.   ESOPHAGOGASTRODUODENOSCOPY (EGD)  WITH PROPOFOL N/A 06/26/2023   Procedure: ESOPHAGOGASTRODUODENOSCOPY (EGD) WITH PROPOFOL;  Surgeon: Lanelle Bal, DO;  [facility];  Service: Endoscopy;  Laterality: N/A;  245PM, ASA 3   OPEN REDUCTION INTERNAL FIXATION (ORIF) TIBIA/FIBULA FRACTURE Right 08/04/2016   Procedure: OPEN REDUCTION INTERNAL FIXATION (ORIF) RIGHT TRIMALLEOLAR FRACTURE;  Surgeon: Toni Arthurs, MD;  Location: [facility];  Service: Orthopedics;  Laterality: Right;   POLYPECTOMY  09/23/2022   Procedure: POLYPECTOMY INTESTINAL;  Surgeon: Lanelle Bal, DO;  [facility];  Service: Endoscopy;;   WRIST SURGERY Left    ganglion cyst    Current Outpatient Medications  Medication Sig Dispense Refill   amLODipine (NORVASC) 5 MG tablet Take 1 tablet by mouth daily.     calcium carbonate (TUMS - DOSED IN MG ELEMENTAL CALCIUM) 500 MG chewable tablet Chew 2 tablets by mouth daily as needed for indigestion or heartburn.     Cholecalciferol (VITAMIN D3) 50 MCG (2000 UT) TABS Take 2,000 Units by mouth daily.     FLUoxetine (PROZAC) 40 MG capsule Take 40 mg by mouth daily.     furosemide (LASIX) 40 MG tablet Take 1 tablet (40 mg total) by mouth 2 (two) times daily. 60 tablet 3   hydrocortisone (ANUSOL-HC) 25 MG suppository Place 1 suppository (25 mg total) rectally 2 (two) times daily.  For 7 days 14 suppository 0   lactulose (CHRONULAC) 10 GM/15ML solution Take 30 mLs (20 g total) by mouth daily as needed for mild constipation. Goal of 2-3 soft bowel movements daily. 946 mL 3   loratadine (CLARITIN) 10 MG tablet Take 10 mg by mouth daily.     metFORMIN (GLUCOPHAGE) 500 MG tablet Take 500 mg by mouth 2 (two) times daily with a meal.     nadolol (CORGARD) 20 MG tablet Take 1 tablet (20 mg total) by mouth daily. 30 tablet 3   ondansetron (ZOFRAN-ODT) 4 MG disintegrating tablet Take 1 tablet (4 mg total) by mouth every 8 (eight) hours as needed for nausea or vomiting. 4mg  ODT q4 hours prn  nausea/vomit 30 tablet 1   pantoprazole (PROTONIX) 40 MG tablet TAKE 1 TABLET BY MOUTH TWICE DAILY BEFORE A MEAL 60 tablet 5   spironolactone (ALDACTONE) 100 MG tablet Take 1 tablet (100 mg total) by mouth 2 (two) times daily. 60 tablet 3   TYLENOL 500 MG tablet Take 1 tablet (500 mg total) by mouth every 6 (six) hours as needed for mild pain (pain score 1-3) or headache. 30 tablet 0   vitamin C (ASCORBIC ACID) 500 MG tablet Take 500 mg by mouth daily.      zinc gluconate 50 MG tablet Take 50 mg by mouth daily.     docusate sodium (COLACE) 100 MG capsule Take 1 capsule (100 mg total) by mouth every 12 (twelve) hours. (Patient not taking: Reported on 08/07/2023) 20 capsule 0   No current facility-administered medications for this visit.    Allergies as of 08/07/2023 - Review Complete 08/07/2023  Allergen Reaction Noted   Hydromorphone Nausea And Vomiting and Other (See Comments) 04/26/2023   Vicodin [hydrocodone-acetaminophen] Nausea And Vomiting and Other (See Comments) 03/05/2012   Codeine Nausea And Vomiting 11/03/2009   Hydrocodone Nausea And Vomiting and Other (See Comments) 04/26/2023   Onion Other (See Comments) 04/26/2023   Tape Swelling and Other (See Comments) 02/21/2015   Latex Rash 12/06/2016    Family History  Problem Relation Age of Onset   Hypertension Mother    Cirrhosis Mother        NASH   Coronary artery disease Father    Heart disease Father    Barrett's esophagus Father    Coronary artery disease Paternal Uncle    Coronary artery disease Cousin    Colon cancer Neg Hx     Social History   Socioeconomic History   Marital status: Legally Separated    Spouse name: Not on file   Number of children: 2   Years of education: Not on file   Highest education level: Not on file  Occupational History   Occupation: property Geologist, engineering: [employer]  Tobacco Use   Smoking status: Former    Current packs/day: 0.00    Average packs/day: 0.3 packs/day for  10.0 years (2.5 ttl pk-yrs)    Types: Cigarettes    Start date: 05/19/1981    Quit date: 05/20/1991    Years since quitting: 32.2   Smokeless tobacco: Never   Tobacco comments:    Used to smoke a pack in 1-2 weeks/ 1992  Vaping Use   Vaping status: Never Used  Substance and Sexual Activity   Alcohol use: Not Currently   Drug use: No   Sexual activity: Yes    Birth control/protection: Surgical  Other Topics Concern   Not on file  Social History Narrative  Not on file   Social Drivers of Health   Financial Resource Strain: Not on file  Food Insecurity: No Food Insecurity (04/26/2023)   Hunger Vital Sign    Worried About Running Out of Food in the Last Year: Never true    Ran Out of Food in the Last Year: Never true  Transportation Needs: No Transportation Needs (04/26/2023)   PRAPARE - Administrator, Civil Service (Medical): No    Lack of Transportation (Non-Medical): No  Physical Activity: Not on file  Stress: Not on file  Social Connections: Not on file    Review of Systems: Gen: Denies fever, chills, cold or flu like symptoms, pre-syncope, or syncope.  CV: Denies chest pain, palpitations. Resp: Denies dyspnea at rest, cough, wheezing, coughing up blood, and pleurisy. GI: Denies vomiting blood, jaundice, and fecal incontinence.   Denies dysphagia or odynophagia. Derm: Denies rash, itching, dry skin Psych: Denies depression, anxiety, memory loss, confusion. No homicidal or suicidal ideation.  Heme: Denies bruising, bleeding, and enlarged lymph nodes.  Physical Exam: BP 129/80 (BP Location: Right Arm, Patient Position: Sitting, Cuff Size: Large)   Pulse 69   Temp 97.7 F (36.5 C) (Temporal)   Ht 5\' 4"  (1.626 m)   Wt 281 lb 9.6 oz (127.7 kg)   BMI 48.34 kg/m  General:   Alert and oriented. No distress noted. Pleasant and cooperative.  Head:  Normocephalic and atraumatic. Eyes:  Conjuctiva clear without scleral icterus. Heart:  S1, S2 present without  murmurs appreciated. Lungs:  Clear to auscultation bilaterally. No wheezes, rales, or rhonchi. No distress.  Abdomen:  +BS, soft, non-tender and non-distended. No rebound or guarding. No HSM or masses noted. Msk:  Symmetrical without gross deformities. Normal posture. Extremities:  Without edema. Neurologic:  Alert and  oriented x4 Psych:  Normal mood and affect.    Assessment:  NASH Cirrhosis:  Decompensated disease in the setting of thrombocytopenia, esophageal varices on nadolol, ascites, and mild hepatic encephalopathy first noted this year.  MELD 3.0 20 on 06/08/23.  Unfortunately, her liver disease has been progressing very quickly over the last few months. She established with [facility]  this morning.  They have recommended starting transplant evaluation and have recommended against TIPS per patient/daughter's report.   Ascites:  Increasing ascites starting in November 2024 requiring recurrent paracentesis.  Worsening ascites multifactorial in setting of poor dietary habits and advancing cirrhosis.  Ultimately, diuretics were increased to max dose of Lasix 40 mg twice daily and spironolactone 100 mg twice daily starting 12/9 which did help limit recurrent ascites as she required paracentesis every 1-2 weeks for about 1 month, but on 1/15, she had insufficient ascites. She had also lost about 30 lbs. Unfortunately, patient has been eating a lot of sodium lately in the setting of her mother passing and eating food that was cooked by family members and has experienced increased abdominal distention along with 15 pound weight gain. I will arrange for repeat paracentesis along with liver doppler to ensure portal vein patency.  Hepatic Encephalopathy: New onset beginning of January 2025. Resolved with lactulose 30 mL daily.  Esophageal varices:   Grade 1 esophageal varices noted on EGD 06/26/2023.  She is currently taking nadolol 20 mg daily and heart rate was 69 today.  Blood pressure  129/80.  Patient reports [facility] recommended increasing nadolol to 40 mg daily.  I agree that this is appropriate.  Heart rate goal should be between 55 and 60.  Anemia/pancytopenia:  Chronic. Baseline Hgb in the 10-11 range. Hgb 11.1 in December 2024, 10.1 on 08/02/23. No overt IG bleeding.  No iron deficiency in December.  No B12 or folate deficiency in November.  EGD 06/26/2023 with grade 1 esophageal varices, portal hypertensive gastropathy, gastritis with biopsies negative for H. pylori.  Colonoscopy March 2024 with nonbleeding internal hemorrhoids, diverticulosis in the sigmoid colon, two 4-5 mm tubular adenomas, due for surveillance in 2029.  Anemia may be related to chronic disease/portal hypertensive gastropathy.  No Hx of CKD. Unable to rule out other causes.  Will recheck iron, B12, folate to ensure nothing has changed.  Could consider referral to hematology pending lab results.    Plan:  Ultrasound paracentesis with labs to rule out SBP Ultrasound liver Doppler Iron panel, B12, folate Continue lactulose 30 mL daily with goal of 2-3 bowel movements daily. Continue Lasix 40 mg twice daily and spironolactone 100 mg twice daily. Resume strict 2 g/day sodium restriction. Agree with increasing nadolol to 40 mg daily as recommended by [facility].  Advised patient to monitor blood pressure and heart rate daily and to notify me if systolic blood pressure drops below 90 or heart rate drops below 50. Continue following with [facility] for liver transplant evaluation.  Request labs from [facility] completed today.  Cancel referral to [facility]. Consider referral to hematology for pancytopenia pending lab results.  Follow-up in 8 weeks or sooner if needed.   Ermalinda Memos, PA-C [facility]enter[facility] 08/07/2023

## 2023-08-07 ENCOUNTER — Telehealth: Payer: Self-pay | Admitting: Gastroenterology

## 2023-08-07 ENCOUNTER — Encounter: Payer: Self-pay | Admitting: Gastroenterology

## 2023-08-07 ENCOUNTER — Other Ambulatory Visit: Payer: Self-pay | Admitting: *Deleted

## 2023-08-07 ENCOUNTER — Ambulatory Visit (INDEPENDENT_AMBULATORY_CARE_PROVIDER_SITE_OTHER): Payer: Medicaid Other | Admitting: Gastroenterology

## 2023-08-07 ENCOUNTER — Encounter: Payer: Self-pay | Admitting: Physician Assistant

## 2023-08-07 ENCOUNTER — Encounter: Payer: Self-pay | Admitting: *Deleted

## 2023-08-07 VITALS — BP 129/80 | HR 69 | Temp 97.7°F | Ht 64.0 in | Wt 281.6 lb

## 2023-08-07 DIAGNOSIS — K7460 Unspecified cirrhosis of liver: Secondary | ICD-10-CM

## 2023-08-07 DIAGNOSIS — Z8719 Personal history of other diseases of the digestive system: Secondary | ICD-10-CM

## 2023-08-07 DIAGNOSIS — D649 Anemia, unspecified: Secondary | ICD-10-CM

## 2023-08-07 DIAGNOSIS — R188 Other ascites: Secondary | ICD-10-CM | POA: Diagnosis not present

## 2023-08-07 DIAGNOSIS — Z860101 Personal history of adenomatous and serrated colon polyps: Secondary | ICD-10-CM

## 2023-08-07 DIAGNOSIS — K7581 Nonalcoholic steatohepatitis (NASH): Secondary | ICD-10-CM

## 2023-08-07 DIAGNOSIS — K7682 Hepatic encephalopathy: Secondary | ICD-10-CM

## 2023-08-07 DIAGNOSIS — I8500 Esophageal varices without bleeding: Secondary | ICD-10-CM

## 2023-08-07 NOTE — Progress Notes (Signed)
Patient follows at hematology clinic [facility]) with NP Durenda Hurt for thrombocytopenia.  I have reviewed labs from PCP dated 08/02/2023 which showed platelets 56. No indication for treatment or change to plan at this time.  Continue ongoing surveillance as recommended by NP Durenda Hurt.  Patient's thrombocytopenia is related to splenomegaly/cirrhosis, and her baseline range is primarily platelets 40-60. She is expected to have some fluctuation in that range, but no cause for concern unless she has any significant bleeding events or platelets dip to <30.   Carnella Guadalajara, PA-C 08/07/23 1:11 PM

## 2023-08-07 NOTE — Telephone Encounter (Signed)
Please cancel referral to [facility] as patient has established with [facility].

## 2023-08-07 NOTE — Patient Instructions (Addendum)
We will get you scheduled for paracentesis and liver doppler.   Continue lactulose 30 ml daily with goal of 3 Bms daily.   Continue current dose of lasix and spironolactone.   Agree with increasing nadolol to 40 mg daily per [facility] recommendations. Monitor your blood pressure and heart rate daily. Notify me if top blood pressure number drops below 90 or heart rate is below 50.   We will plan to see you back in 8 weeks or sooner if needed.   Ermalinda Memos, PA-C [facility]enter[facility]

## 2023-08-08 ENCOUNTER — Encounter: Payer: Self-pay | Admitting: Gastroenterology

## 2023-08-08 NOTE — Telephone Encounter (Signed)
Referral cancelled with [facility]

## 2023-08-11 ENCOUNTER — Telehealth: Payer: Self-pay | Admitting: Gastroenterology

## 2023-08-11 ENCOUNTER — Encounter (HOSPITAL_COMMUNITY): Payer: Self-pay

## 2023-08-11 ENCOUNTER — Ambulatory Visit (HOSPITAL_COMMUNITY)
Admission: RE | Admit: 2023-08-11 | Discharge: 2023-08-11 | Disposition: A | Discharge status: Home / Self Care | Payer: Medicaid Other | Source: Ambulatory Visit | Attending: Gastroenterology | Admitting: Gastroenterology

## 2023-08-11 ENCOUNTER — Other Ambulatory Visit (HOSPITAL_COMMUNITY)
Admission: RE | Admit: 2023-08-11 | Discharge: 2023-08-11 | Disposition: A | Discharge status: Home / Self Care | Payer: Medicaid Other | Source: Ambulatory Visit | Attending: Gastroenterology | Admitting: Gastroenterology

## 2023-08-11 VITALS — BP 122/71 | HR 72 | Resp 16

## 2023-08-11 DIAGNOSIS — K7210 Chronic hepatic failure without coma: Secondary | ICD-10-CM | POA: Insufficient documentation

## 2023-08-11 DIAGNOSIS — R188 Other ascites: Secondary | ICD-10-CM | POA: Insufficient documentation

## 2023-08-11 DIAGNOSIS — R160 Hepatomegaly, not elsewhere classified: Secondary | ICD-10-CM | POA: Diagnosis not present

## 2023-08-11 DIAGNOSIS — K7460 Unspecified cirrhosis of liver: Secondary | ICD-10-CM

## 2023-08-11 LAB — BODY FLUID CELL COUNT WITH DIFFERENTIAL
Eos, Fluid: 0 %
Lymphs, Fluid: 42 %
Monocyte-Macrophage-Serous Fluid: 27 % — ABNORMAL LOW (ref 50–90)
Neutrophil Count, Fluid: 31 % — ABNORMAL HIGH (ref 0–25)
Total Nucleated Cell Count, Fluid: 265 uL (ref 0–1000)

## 2023-08-11 LAB — IRON AND TIBC
Iron: 80 ug/dL (ref 28–170)
Saturation Ratios: 23 % (ref 10.4–31.8)
TIBC: 353 ug/dL (ref 250–450)
UIBC: 273 ug/dL

## 2023-08-11 LAB — ALBUMIN, PLEURAL OR PERITONEAL FLUID: Albumin, Fluid: <1.5 g/dL

## 2023-08-11 LAB — GRAM STAIN

## 2023-08-11 LAB — VITAMIN B12: Vitamin B-12: 701 pg/mL (ref 180–914)

## 2023-08-11 LAB — FOLATE: Folate: 19.9 ng/mL (ref >5.9)

## 2023-08-11 LAB — FERRITIN: Ferritin: 18 ng/mL (ref 11–307)

## 2023-08-11 MED ORDER — ALBUMIN HUMAN 25 % IV SOLN
INTRAVENOUS | Status: AC
  Filled 2023-08-11: qty 100

## 2023-08-11 MED ORDER — ALBUMIN HUMAN 25 % IV SOLN
0.0000 g | Freq: Once | INTRAVENOUS | Status: AC
  Administered 2023-08-11: 25 g via INTRAVENOUS

## 2023-08-11 NOTE — Progress Notes (Signed)
Patient tolerated right sided paracentesis and 25G of IV albumin well today and 1.3 Liters of clear yellow ascites removed with labs collected and sent for processing. Patient verbalized understanding of discharge instructions and ambulatory at departure with no acute distress noted.

## 2023-08-11 NOTE — Telephone Encounter (Signed)
Received and reviewed labs from [facility] dated 08/07/2023.  Hemoglobin 10.3 (L), MCV 91.9, MCH 31.4, MCHC 34.1, platelets 53 (L) Sodium 137, potassium 4.5, creatinine 0.65, glucose 161, albumin 2.8 (L), total bilirubin 4.0 (H), AST 31, ALT 22, alk phos 116 INR 1.42 Immune to hepatitis A. No immunity to hepatitis B.  MELD 3.0 is 18   Needs to complete Hep B vaccination, but this will likely be started at her next visit with [facility].   No additional recommendations at this time.  Waiting on other labs and imaging to result that I have ordered.

## 2023-08-11 NOTE — Procedures (Signed)
PROCEDURE SUMMARY:  Successful ultrasound guided paracentesis from the right lower quadrant.  Yielded 1.3 L of clear yellow fluid.  No immediate complications.  The patient tolerated the procedure well.   Specimen was sent for labs.  EBL < 5mL  The patient has required >/= 2 paracenteses in a 30 day period however she will not be referred to the [facility]N clinic for evaluation as she is currently undergoing evaluation for liver transplant at [facility] and they have advised against TIPS. Please reach out to IR if evaluation for TIPS is desired in the future.  Lynnette Caffey, PA-C

## 2023-08-14 ENCOUNTER — Encounter: Payer: Self-pay | Admitting: Internal Medicine

## 2023-08-15 LAB — CYTOLOGY - NON PAP

## 2023-08-16 ENCOUNTER — Encounter: Payer: Self-pay | Admitting: Gastroenterology

## 2023-08-16 LAB — CULTURE, BODY FLUID W GRAM STAIN -BOTTLE: Culture: NO GROWTH

## 2023-09-12 NOTE — Telephone Encounter (Signed)
 Mandy/Reba: FYI, patient is confirming her appt for March 26th. I will let her know she is scheduled. Nothing further needed.

## 2023-09-13 ENCOUNTER — Other Ambulatory Visit: Payer: Self-pay

## 2023-09-13 DIAGNOSIS — D61818 Other pancytopenia: Secondary | ICD-10-CM

## 2023-09-14 ENCOUNTER — Inpatient Hospital Stay: Payer: Medicaid Other | Attending: Hematology

## 2023-09-14 DIAGNOSIS — D72819 Decreased white blood cell count, unspecified: Secondary | ICD-10-CM | POA: Insufficient documentation

## 2023-09-14 DIAGNOSIS — R161 Splenomegaly, not elsewhere classified: Secondary | ICD-10-CM | POA: Insufficient documentation

## 2023-09-14 DIAGNOSIS — K7581 Nonalcoholic steatohepatitis (NASH): Secondary | ICD-10-CM | POA: Diagnosis not present

## 2023-09-14 DIAGNOSIS — Z87891 Personal history of nicotine dependence: Secondary | ICD-10-CM | POA: Diagnosis not present

## 2023-09-14 DIAGNOSIS — R21 Rash and other nonspecific skin eruption: Secondary | ICD-10-CM | POA: Insufficient documentation

## 2023-09-14 DIAGNOSIS — Z803 Family history of malignant neoplasm of breast: Secondary | ICD-10-CM | POA: Insufficient documentation

## 2023-09-14 DIAGNOSIS — D696 Thrombocytopenia, unspecified: Secondary | ICD-10-CM | POA: Insufficient documentation

## 2023-09-14 DIAGNOSIS — D61818 Other pancytopenia: Secondary | ICD-10-CM

## 2023-09-14 LAB — CBC WITH DIFFERENTIAL/PLATELET
Abs Immature Granulocytes: 0.01 10*3/uL (ref 0.00–0.07)
Basophils Absolute: 0 10*3/uL (ref 0.0–0.1)
Basophils Relative: 0 %
Eosinophils Absolute: 0.1 10*3/uL (ref 0.0–0.5)
Eosinophils Relative: 1 %
HCT: 36.8 % (ref 36.0–46.0)
Hemoglobin: 12.1 g/dL (ref 12.0–15.0)
Immature Granulocytes: 0 %
Lymphocytes Relative: 30 %
Lymphs Abs: 1.4 10*3/uL (ref 0.7–4.0)
MCH: 32.4 pg (ref 26.0–34.0)
MCHC: 32.9 g/dL (ref 30.0–36.0)
MCV: 98.7 fL (ref 80.0–100.0)
Monocytes Absolute: 0.4 10*3/uL (ref 0.1–1.0)
Monocytes Relative: 9 %
Neutro Abs: 2.6 10*3/uL (ref 1.7–7.7)
Neutrophils Relative %: 60 %
Platelets: 46 10*3/uL — ABNORMAL LOW (ref 150–400)
RBC: 3.73 MIL/uL — ABNORMAL LOW (ref 3.87–5.11)
RDW: 17.7 % — ABNORMAL HIGH (ref 11.5–15.5)
WBC: 4.5 10*3/uL (ref 4.0–10.5)
nRBC: 0 % (ref 0.0–0.2)

## 2023-09-14 LAB — LACTATE DEHYDROGENASE: LDH: 141 U/L (ref 98–192)

## 2023-09-18 ENCOUNTER — Ambulatory Visit: Admission: EM | Admit: 2023-09-18 | Discharge: 2023-09-18 | Disposition: A | Discharge status: Home / Self Care

## 2023-09-18 DIAGNOSIS — R21 Rash and other nonspecific skin eruption: Secondary | ICD-10-CM | POA: Diagnosis not present

## 2023-09-18 MED ORDER — DOXYCYCLINE HYCLATE 100 MG PO TABS: 100.0000 mg | ORAL_TABLET | Freq: Two times a day (BID) | ORAL | 0 refills | Status: AC

## 2023-09-18 MED ORDER — DEXAMETHASONE SODIUM PHOSPHATE 10 MG/ML IJ SOLN
10.0000 mg | INTRAMUSCULAR | Status: AC
  Administered 2023-09-18: 10 mg via INTRAMUSCULAR

## 2023-09-18 NOTE — ED Provider Notes (Signed)
 [facility]    CSN: [id number] Arrival date & time: 09/18/23  1644      History   Chief Complaint No chief complaint on file.   HPI Candice Stanley is a 58 y.o. female.   The history is provided by the patient.   Patient presents for complaints of an area of redness on her left side/flank that is been present for the past 24 hours.  Patient states the area "just appeared."  She states that the area has not increased in size, but it does appear more reddened.  Patient is on the transplant list for a liver transplant.  Patient states she is concern for cellulitis.  She states that the area does not itch, but can be sensitive to touch.  She denies fever, chills, chest pain, abdominal pain, nausea, vomiting, or diarrhea.  She is not taking any medication for her symptoms.  She was concerned because she states the cellulitis in her right thigh preempted her liver disease.  Past Medical History:  Diagnosis Date   Anxiety    Cirrhosis (HCC)    Depression    GERD (gastroesophageal reflux disease)    with grade d esophagitis in March 2023.   Hypercholesteremia    Hypertension    Trimalleolar fracture of ankle, closed, right, sequela    Type 2 diabetes mellitus (HCC)     Patient Active Problem List   Diagnosis Date Noted   Abdominal distention 05/30/2023   Cirrhosis of liver (HCC) 05/30/2023   Cellulitis of right thigh 04/26/2023   Esophageal varices (HCC) 12/29/2022   Pain of upper abdomen 11/23/2022   Constipation 11/23/2022   Abdominal bloating 11/23/2022   Incarcerated umbilical hernia    Gastroesophageal reflux disease with esophagitis without hemorrhage 10/21/2021   Colon cancer screening 10/21/2021   Pancytopenia (HCC) 09/24/2021   Acute esophagitis    Acute pulmonary edema (HCC) 09/21/2021   Acute respiratory failure with hypoxia (HCC) 09/18/2021   Essential hypertension 09/18/2021   Obesity, Class III, BMI 40-49.9 (morbid obesity) (HCC) 09/18/2021    Thrombocytopenia (HCC) 09/18/2021   Lobar pneumonia (HCC) 09/18/2021   Cirrhosis of liver without ascites (HCC) 09/18/2021   Severe sepsis (HCC) 09/18/2021   Hypomagnesemia 09/18/2021   Hypothyroidism 06/05/2018   Uncontrolled type 2 diabetes mellitus with hyperglycemia, without long-term current use of insulin (HCC) 06/05/2018   Hypophosphatemia 06/05/2018   Depression with anxiety 06/05/2018   Chronic leukopenia 06/05/2018   Hypokalemia 06/05/2018   Chronic cough 12/06/2016   Precordial pain    Family history of early CAD    Atypical chest pain 09/02/2015   Dysphagia 04/16/2012   Odynophagia 04/16/2012   Unspecified hypothyroidism 11/03/2009   Hyperlipidemia 11/03/2009   DYSTHYMIC DISORDER 11/03/2009   GERD (gastroesophageal reflux disease) 11/03/2009   Diaphragmatic hernia 11/03/2009   CHEST PAIN UNSPECIFIED 11/03/2009    Past Surgical History:  Procedure Laterality Date   ABDOMINAL HYSTERECTOMY     BIOPSY  09/21/2021   Procedure: BIOPSY;  Surgeon: Dolores Frame, MD;  Location: [facility];  Service: Gastroenterology;;   BIOPSY  06/26/2023   Procedure: BIOPSY;  Surgeon: Lanelle Bal, DO;  Location: [facility];  Service: Endoscopy;;   CARDIAC CATHETERIZATION N/A 09/10/2015   Procedure: Left Heart Cath and Coronary Angiography;  Surgeon: Corky Crafts, MD;  Location: [facility] INVASIVE CV LAB;  Service: Cardiovascular;  Laterality: N/A;   CHOLECYSTECTOMY     COLONOSCOPY WITH PROPOFOL N/A 09/23/2022   Procedure: COLONOSCOPY WITH PROPOFOL;  Surgeon: Lanelle Bal, DO;  Location: [facility];  Service: Endoscopy;  Laterality: N/A;  1:15 pn, asa 3   ESOPHAGEAL BRUSHING  09/21/2021   Procedure: ESOPHAGEAL BRUSHING;  Surgeon: Marguerita Merles, Reuel Boom, MD;  Location: [facility];  Service: Gastroenterology;;   ESOPHAGOGASTRODUODENOSCOPY (EGD) WITH PROPOFOL N/A 09/21/2021   Surgeon: Dolores Frame, MD;  grade D esophagitis with scant bleeding,  KOH negative, could not rule out small varices, but no medium to large varices, mild portal hypertensive gastropathy.  Pathology showed mild foveolar hyperplasia, negative for H. pylori.  Recommended repeat EGD in 3 months.   ESOPHAGOGASTRODUODENOSCOPY (EGD) WITH PROPOFOL N/A 09/23/2022   Surgeon: Lanelle Bal, DO; grade 1 esophageal varices, portal hypertensive gastropathy.  Recommended considering nonselective beta-blocker for primary prophylaxis.   ESOPHAGOGASTRODUODENOSCOPY (EGD) WITH PROPOFOL N/A 06/26/2023   Procedure: ESOPHAGOGASTRODUODENOSCOPY (EGD) WITH PROPOFOL;  Surgeon: Lanelle Bal, DO;  Location: [facility];  Service: Endoscopy;  Laterality: N/A;  245PM, ASA 3   OPEN REDUCTION INTERNAL FIXATION (ORIF) TIBIA/FIBULA FRACTURE Right 08/04/2016   Procedure: OPEN REDUCTION INTERNAL FIXATION (ORIF) RIGHT TRIMALLEOLAR FRACTURE;  Surgeon: Toni Arthurs, MD;  Location: [facility];  Service: Orthopedics;  Laterality: Right;   POLYPECTOMY  09/23/2022   Procedure: POLYPECTOMY INTESTINAL;  Surgeon: Lanelle Bal, DO;  Location: [facility];  Service: Endoscopy;;   WRIST SURGERY Left    ganglion cyst    OB History     Gravida  1   Para  1   Term  1   Preterm      AB      Living  1      SAB      IAB      Ectopic      Multiple      Live Births               Home Medications    Prior to Admission medications   Medication Sig Start Date End Date Taking? Authorizing Provider  amLODipine (NORVASC) 5 MG tablet Take 1 tablet by mouth daily.    [provider]  calcium carbonate (TUMS - DOSED IN MG ELEMENTAL CALCIUM) 500 MG chewable tablet Chew 2 tablets by mouth daily as needed for indigestion or heartburn. 11/01/21   [provider]  Cholecalciferol (VITAMIN D3) 50 MCG (2000 UT) TABS Take 2,000 Units by mouth daily.    [provider]  docusate sodium (COLACE) 100 MG capsule Take 1 capsule (100 mg total) by mouth  every 12 (twelve) hours. Patient not taking: Reported on 08/07/2023 06/08/23   Gilda Crease, MD  FLUoxetine (PROZAC) 40 MG capsule Take 40 mg by mouth daily. 08/15/19   [provider]  furosemide (LASIX) 40 MG tablet Take 1 tablet (40 mg total) by mouth 2 (two) times daily. 06/12/23   Letta Median, PA-C  hydrocortisone (ANUSOL-HC) 25 MG suppository Place 1 suppository (25 mg total) rectally 2 (two) times daily. For 7 days 06/22/23   Pappayliou, Santina Evans A, DO  lactulose (CHRONULAC) 10 GM/15ML solution Take 30 mLs (20 g total) by mouth daily as needed for mild constipation. Goal of 2-3 soft bowel movements daily. 12/08/22   Letta Median, PA-C  loratadine (CLARITIN) 10 MG tablet Take 10 mg by mouth daily.    [provider]  metFORMIN (GLUCOPHAGE) 500 MG tablet Take 500 mg by mouth 2 (two) times daily with a meal.    [provider]  nadolol (CORGARD)  20 MG tablet Take 1 tablet (20 mg total) by mouth daily. 12/28/22   Letta Median, PA-C  ondansetron (ZOFRAN-ODT) 4 MG disintegrating tablet Take 1 tablet (4 mg total) by mouth every 8 (eight) hours as needed for nausea or vomiting. 4mg  ODT q4 hours prn nausea/vomit 06/19/23   Letta Median, PA-C  pantoprazole (PROTONIX) 40 MG tablet TAKE 1 TABLET BY MOUTH TWICE DAILY BEFORE A MEAL 01/23/23   Letta Median, PA-C  spironolactone (ALDACTONE) 100 MG tablet Take 1 tablet (100 mg total) by mouth 2 (two) times daily. 06/12/23   Letta Median, PA-C  TYLENOL 500 MG tablet Take 1 tablet (500 mg total) by mouth every 6 (six) hours as needed for mild pain (pain score 1-3) or headache. 04/28/23   Barnetta Chapel, MD  vitamin C (ASCORBIC ACID) 500 MG tablet Take 500 mg by mouth daily.     [provider]  zinc gluconate 50 MG tablet Take 50 mg by mouth daily.    [provider]    Family History Family History  Problem Relation Age of Onset   Hypertension Mother    Cirrhosis Mother         NASH   Coronary artery disease Father    Heart disease Father    Barrett's esophagus Father    Coronary artery disease Paternal Uncle    Coronary artery disease Cousin    Colon cancer Neg Hx     Social History Social History   Tobacco Use   Smoking status: Former    Current packs/day: 0.00    Average packs/day: 0.3 packs/day for 10.0 years (2.5 ttl pk-yrs)    Types: Cigarettes    Start date: 05/19/1981    Quit date: 05/20/1991    Years since quitting: 32.3   Smokeless tobacco: Never   Tobacco comments:    Used to smoke a pack in 1-2 weeks/ 1992  Vaping Use   Vaping status: Never Used  Substance Use Topics   Alcohol use: Not Currently   Drug use: No     Allergies   Hydromorphone, Vicodin [hydrocodone-acetaminophen], Codeine, Hydrocodone, Onion, Tape, and Latex   Review of Systems Review of Systems Per HPI  Physical Exam Triage Vital Signs ED Triage Vitals  Encounter Vitals Group     BP      Systolic BP Percentile      Diastolic BP Percentile      Pulse      Resp      Temp      Temp src      SpO2      Weight      Height      Head Circumference      Peak Flow      Pain Score      Pain Loc      Pain Education      Exclude from Growth Chart    No data found.  Updated Vital Signs There were no vitals taken for this visit.  Visual Acuity Right Eye Distance:   Left Eye Distance:   Bilateral Distance:    Right Eye Near:   Left Eye Near:    Bilateral Near:     Physical Exam Vitals and nursing note reviewed.  Constitutional:      General: She is not in acute distress.    Appearance: Normal appearance.  HENT:     Head: Normocephalic.  Eyes:     Extraocular Movements: Extraocular movements intact.  Pupils: Pupils are equal, round, and reactive to light.  Cardiovascular:     Rate and Rhythm: Normal rate and regular rhythm.     Pulses: Normal pulses.     Heart sounds: Normal heart sounds.  Musculoskeletal:     Cervical back: Normal range  of motion.  Skin:    General: Skin is warm and dry.     Findings: Erythema present.          Comments: Large area of erythema noted to the left flank/side.  The area is flat, distinctive borders noted by areas of erythema.  There is no oozing, fluctuance, or drainage present.  Area is mildly sensitive to touch.  Neurological:     General: No focal deficit present.     Mental Status: She is alert and oriented to person, place, and time.  Psychiatric:        Mood and Affect: Mood normal.        Behavior: Behavior normal.      UC Treatments / Results  Labs (all labs ordered are listed, but only abnormal results are displayed) Labs Reviewed - No data to display  EKG   Radiology No results found.  Procedures Procedures (including critical care time)  Medications Ordered in UC Medications - No data to display  Initial Impression / Assessment and Plan / UC Course  I have reviewed the triage vital signs and the nursing notes.  Pertinent labs & imaging results that were available during my care of the patient were reviewed by me and considered in my medical decision making (see chart for details).  Low suspicion for cellulitis; however, given patient's concern, will treat empirically with doxycycline 100 mg.  Decadron 10 mg IM administered for inflammation.  Supportive care recommendations were provided and discussed with the patient to include over-the-counter antihistamines for itching, avoiding hot baths or showers, and applying cool compresses to the affected area.  Patient was advised to notify her transplant team as soon as possible regarding her symptoms.  Patient was given strict ER follow-up precautions.  Patient was in agreement with this plan of care and verbalized understanding.  All questions were answered.  Patient stable for discharge.  Final Clinical Impressions(s) / UC Diagnoses   Final diagnoses:  None   Discharge Instructions   None    ED Prescriptions    None    PDMP not reviewed this encounter.   Abran Cantor, NP 09/19/23 1600

## 2023-09-18 NOTE — ED Triage Notes (Signed)
 Pt reports red sore I the left side that appeared yesterday. Called liver specialist they said she needed to be seen immediately.

## 2023-09-18 NOTE — Discharge Instructions (Addendum)
 You were given an injection of Decadron 10mg . Take medication as prescribed. May also take over-the-counter Zyrtec to help with itching. Avoid hot baths or showers while symptoms persist.  Recommend taking lukewarm baths. May apply cool cloths to the area to help with itching or discomfort. Avoid scratching, rubbing, or manipulating the areas while symptoms persist. Recommend Aveeno colloidal oatmeal bath to use to help with drying and itching. Follow-up with your transplant team as discussed. Follow-up as needed.

## 2023-09-19 ENCOUNTER — Encounter (HOSPITAL_COMMUNITY): Payer: Self-pay

## 2023-09-19 ENCOUNTER — Ambulatory Visit (HOSPITAL_COMMUNITY)
Admission: RE | Admit: 2023-09-19 | Discharge: 2023-09-19 | Disposition: A | Discharge status: Home / Self Care | Payer: Medicaid Other | Source: Ambulatory Visit | Attending: Family Medicine | Admitting: Family Medicine

## 2023-09-19 DIAGNOSIS — N6459 Other signs and symptoms in breast: Secondary | ICD-10-CM

## 2023-09-21 ENCOUNTER — Inpatient Hospital Stay (HOSPITAL_BASED_OUTPATIENT_CLINIC_OR_DEPARTMENT_OTHER): Payer: Medicaid Other | Admitting: Oncology

## 2023-09-21 VITALS — BP 125/72 | HR 74 | Temp 97.0°F | Resp 16 | Ht 64.0 in | Wt 242.0 lb

## 2023-09-21 DIAGNOSIS — R21 Rash and other nonspecific skin eruption: Secondary | ICD-10-CM

## 2023-09-21 DIAGNOSIS — D61818 Other pancytopenia: Secondary | ICD-10-CM

## 2023-09-21 DIAGNOSIS — D696 Thrombocytopenia, unspecified: Secondary | ICD-10-CM | POA: Diagnosis not present

## 2023-09-21 MED ORDER — DOXYCYCLINE HYCLATE 100 MG PO TABS: 100.0000 mg | ORAL_TABLET | Freq: Two times a day (BID) | ORAL | 0 refills | Status: DC

## 2023-09-21 MED ORDER — MUPIROCIN CALCIUM 2 % EX CREA: 1.0000 | TOPICAL_CREAM | Freq: Two times a day (BID) | CUTANEOUS | 0 refills | Status: DC

## 2023-09-21 NOTE — Progress Notes (Signed)
 [facility] [address] [place] [address]    Clinic Day:  09/21/2023  Referring physician: Benita Stabile, MD  Patient Care Team: Benita Stabile, MD as PCP - General (Internal Medicine) West Bali, MD (Inactive) as Consulting Physician (Gastroenterology) Lanelle Bal, DO as Consulting Physician (Gastroenterology)   ASSESSMENT & PLAN:   Assessment: 1.  Leukopenia and thrombocytopenia: - Patient seen at the request of Dr. Scharlene Gloss office. - CBC on 03/31/2021 with white count 2.1, platelet count 48, ANC 900.  Hemoglobin was 11.7. - CBC on 12/30/2020 white count 2.5, platelet count 57, hemoglobin 12.5, ANC 1.2. - Ultrasound abdomen on 06/05/2018 with increased hepatic echogenicity, fatty infiltration and splenomegaly measuring 18.8 cm, volume 1943. - No bleeding although easy bruising.  No B symptoms or infections.  No history of transfusions or hepatitis. - Progressive thrombocytopenia and leukopenia since 2018. - BMBX on 04/21/2021 showed slightly hypercellular bone marrow with trilineage hematopoiesis.  Slight lymphocytosis of primarily small lymphoid cells with lack of significant aggregates.  Chromosome analysis is normal.  2.  Social/family history: - Lives at home with her husband.  She works as a Investment banker, corporate.  Denies any chemical exposure.  She is a non-smoker. - Her mother hadsNash cirrhosis.  Maternal grandmother had breast cancer.  Paternal aunt had breast cancer.   Plan: 1.  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 - She had pneumonia with sepsis in March 2023 requiring admission.  She did not have any further infections after that. - Denies any nosebleeds, bleeding per rectum or melena. - She is currently working in home health. - Labs from 09/14/2023 show white blood cell count of 4.5, hemoglobin 12.1 and ANC 2.6.  Platelets 46,000. - Will consider splenic embolization if  platelet count continues to drop. -She is still on [facility] liver [facility] list. - RTC 6 months with repeat CBC.  2. Rash: -Currently on treatment with doxycycline 100 mg twice daily x 7 days.   -She was also given 10 mg dexamethasone injection while in clinic on 09/18/2023.   -Left upper torso rash has improved and is now scaly and dry. -Developed right upper arm maculopapular rash with erythema and warmth 1 to 2 days ago.  She has not tried anything topically on it.  She is taking the doxycycline which she felt liquid cover for rash/cellulitis given left torso appeared similarly.  -We discussed continuing out doxycycline for total of 10 days so 3 additional days were sent in for her to pick up.  I also recommend a topical cream called mupirocin which she may apply to affected areas twice daily.  Please let me know if there is no any significant movement over the next couple of days given her history.  3. IDA: -Labs from 08/11/2023 show iron sats of 23% with TIBC of 353.  Ferritin is 18.  Discussed starting iron supplements.  B12 levels normal.   PLAN SUMMARY: >> Continue to monitor lab work (CBC with differential, LDH) every 6 months and follow-up. >> Platelet count is stable.      No orders of the defined types were placed in this encounter.  I spent 25 minutes dedicated to the care of this patient (face-to-face and non-face-to-face) on the date of the encounter to include what is described in the assessment and plan.    Mauro Kaufmann, NP   3/13/20252:38 PM  CHIEF COMPLAINT:   Diagnosis:  leukopenia and thrombocytopenia  Cancer Staging  No matching staging information was found for the patient.    Prior Therapy: None  Current Therapy:   surveillance   HISTORY OF PRESENT ILLNESS:   Oncology History   No history exists.     INTERVAL HISTORY:   Kang is a 58 y.o. female presenting to clinic today for follow up of leukopenia and thrombocytopenia. She was last seen by  me on 03/09/2023.  Since her last visit, she has been evaluated on several occasions at urgent cares and emergency rooms.  She was seen on 04/01/2023 for chest pain, 04/23/2023 for wound to right leg after fall which then later turned into an abscess (04/26/2023) requiring IV antibiotics, 05/09/2023 for chest palpitations and on 06/08/2023 for rectal pain and hemorrhoids.  Reports she was on 70 different antibiotics for cellulitis of her right knee that she developed horrible diarrhea, hemorrhoids and anal fissure.  She is followed closely by gastroenterology for this.  Most recently she developed a rash on her upper torso and she was evaluated at urgent care on 09/18/2023.  She was started on doxycycline and given a dexamethasone injection.  Reports rash has become less red and now dry and scaly.  She noticed 2 days ago a rash to right upper arm and she is concerned this could be cellulitis.  She had a EGD on 06/26/2023 with Dr. Marletta Lor which showed grade 1 esophageal varices, portal hypertensive gastropathy, gastritis normal duodenal bulb for generalized abdominal pain, nausea and follow-up for esophageal varices.  She has had intermittent paracentesis (about every month) last on 08/11/23 where 1.3 L of clear yellow fluid was removed.  She is followed closely by [facility] liver transplant team and communicates with them frequently.  Today, she states that she is doing well overall. Her appetite level is at 100%. Her energy level is at 75%. She denies any recent bleeding. She denies any blood in stool and black stool.  Has occasional sinus associated with her vertigo.  She currently works in home health care.   PAST MEDICAL HISTORY:   Past Medical History: Past Medical History:  Diagnosis Date   Anxiety    Cirrhosis (HCC)    Depression    GERD (gastroesophageal reflux disease)    with grade d esophagitis in March 2023.   Hypercholesteremia    Hypertension    Trimalleolar fracture of ankle, closed,  right, sequela    Type 2 diabetes mellitus (HCC)     Surgical History: Past Surgical History:  Procedure Laterality Date   ABDOMINAL HYSTERECTOMY     BIOPSY  09/21/2021   Procedure: BIOPSY;  Surgeon: Dolores Frame, MD;  [facility]: [facility];  Service: Gastroenterology;;   BIOPSY  06/26/2023   Procedure: BIOPSY;  Surgeon: Lanelle Bal, DO;  [facility]: [facility];  Service: Endoscopy;;   CARDIAC CATHETERIZATION N/A 09/10/2015   Procedure: Left Heart Cath and Coronary Angiography;  Surgeon: Corky Crafts, MD;  Location: [facility] INVASIVE CV LAB;  Service: Cardiovascular;  Laterality: N/A;   CHOLECYSTECTOMY     COLONOSCOPY WITH PROPOFOL N/A 09/23/2022   Procedure: COLONOSCOPY WITH PROPOFOL;  Surgeon: Lanelle Bal, DO;  [facility]: [facility];  Service: Endoscopy;  Laterality: N/A;  1:15 pn, asa 3   ESOPHAGEAL BRUSHING  09/21/2021   Procedure: ESOPHAGEAL BRUSHING;  Surgeon: Dolores Frame, MD;  [facility]: [facility];  Service: Gastroenterology;;   ESOPHAGOGASTRODUODENOSCOPY (EGD) WITH PROPOFOL N/A 09/21/2021   Surgeon: Dolores Frame, MD;  grade D esophagitis  with scant bleeding, KOH negative, could not rule out small varices, but no medium to large varices, mild portal hypertensive gastropathy.  Pathology showed mild foveolar hyperplasia, negative for H. pylori.  Recommended repeat EGD in 3 months.   ESOPHAGOGASTRODUODENOSCOPY (EGD) WITH PROPOFOL N/A 09/23/2022   Surgeon: Lanelle Bal, DO; grade 1 esophageal varices, portal hypertensive gastropathy.  Recommended considering nonselective beta-blocker for primary prophylaxis.   ESOPHAGOGASTRODUODENOSCOPY (EGD) WITH PROPOFOL N/A 06/26/2023   Procedure: ESOPHAGOGASTRODUODENOSCOPY (EGD) WITH PROPOFOL;  Surgeon: Lanelle Bal, DO;  [facility];  Service: Endoscopy;  Laterality: N/A;  245PM, ASA 3   OPEN REDUCTION INTERNAL FIXATION (ORIF) TIBIA/FIBULA FRACTURE Right 08/04/2016    Procedure: OPEN REDUCTION INTERNAL FIXATION (ORIF) RIGHT TRIMALLEOLAR FRACTURE;  Surgeon: Toni Arthurs, MD;  Location:  SURGERY CENTER;  Service: Orthopedics;  Laterality: Right;   POLYPECTOMY  09/23/2022   Procedure: POLYPECTOMY INTESTINAL;  Surgeon: Lanelle Bal, DO;  [facility]: [facility];  Service: Endoscopy;;   WRIST SURGERY Left    ganglion cyst    Social History: Social History   Socioeconomic History   Marital status: Legally Separated    Spouse name: Not on file   Number of children: 2   Years of education: Not on file   Highest education level: Not on file  Occupational History   Occupation: property mgmt    Employer: [employer]  Tobacco Use   Smoking status: Former    Current packs/day: 0.00    Average packs/day: 0.3 packs/day for 10.0 years (2.5 ttl pk-yrs)    Types: Cigarettes    Start date: 05/19/1981    Quit date: 05/20/1991    Years since quitting: 32.3   Smokeless tobacco: Never   Tobacco comments:    Used to smoke a pack in 1-2 weeks/ 1992  Vaping Use   Vaping status: Never Used  Substance and Sexual Activity   Alcohol use: Not Currently   Drug use: No   Sexual activity: Yes    Birth control/protection: Surgical  Other Topics Concern   Not on file  Social History Narrative   Not on file   Social Drivers of Health   Financial Resource Strain: Not on file  Food Insecurity: No Food Insecurity (04/26/2023)   Hunger Vital Sign    Worried About Running Out of Food in the Last Year: Never true    Ran Out of Food in the Last Year: Never true  Transportation Needs: No Transportation Needs (04/26/2023)   PRAPARE - Administrator, Civil Service (Medical): No    Lack of Transportation (Non-Medical): No  Physical Activity: Not on file  Stress: Not on file  Social Connections: Not on file  Intimate Partner Violence: Not At Risk (04/26/2023)   Humiliation, Afraid, Rape, and Kick questionnaire    Fear of Current or Ex-Partner:  No    Emotionally Abused: No    Physically Abused: No    Sexually Abused: No    Family History: Family History  Problem Relation Age of Onset   Hypertension Mother    Cirrhosis Mother        NASH   Coronary artery disease Father    Heart disease Father    Barrett's esophagus Father    Coronary artery disease Paternal Uncle    Coronary artery disease Cousin    Colon cancer Neg Hx     Current Medications:  Current Outpatient Medications:    amLODipine (NORVASC) 5 MG tablet, Take 1 tablet by  mouth daily., Disp: , Rfl:    calcium carbonate (TUMS - DOSED IN MG ELEMENTAL CALCIUM) 500 MG chewable tablet, Chew 2 tablets by mouth daily as needed for indigestion or heartburn., Disp: , Rfl:    Cholecalciferol (VITAMIN D3) 50 MCG (2000 UT) TABS, Take 2,000 Units by mouth daily., Disp: , Rfl:    docusate sodium (COLACE) 100 MG capsule, Take 1 capsule (100 mg total) by mouth every 12 (twelve) hours. (Patient not taking: Reported on 08/07/2023), Disp: 20 capsule, Rfl: 0   doxycycline (VIBRA-TABS) 100 MG tablet, Take 1 tablet (100 mg total) by mouth 2 (two) times daily for 7 days., Disp: 14 tablet, Rfl: 0   FLUoxetine (PROZAC) 40 MG capsule, Take 40 mg by mouth daily., Disp: , Rfl:    furosemide (LASIX) 40 MG tablet, Take 1 tablet (40 mg total) by mouth 2 (two) times daily., Disp: 60 tablet, Rfl: 3   hydrocortisone (ANUSOL-HC) 25 MG suppository, Place 1 suppository (25 mg total) rectally 2 (two) times daily. For 7 days, Disp: 14 suppository, Rfl: 0   lactulose (CHRONULAC) 10 GM/15ML solution, Take 30 mLs (20 g total) by mouth daily as needed for mild constipation. Goal of 2-3 soft bowel movements daily., Disp: 946 mL, Rfl: 3   loratadine (CLARITIN) 10 MG tablet, Take 10 mg by mouth daily., Disp: , Rfl:    metFORMIN (GLUCOPHAGE) 500 MG tablet, Take 500 mg by mouth 2 (two) times daily with a meal., Disp: , Rfl:    MOUNJARO 10 MG/0.5ML Pen, Inject 10 mg into the skin once a week., Disp: , Rfl:     nadolol (CORGARD) 20 MG tablet, Take 1 tablet (20 mg total) by mouth daily., Disp: 30 tablet, Rfl: 3   ondansetron (ZOFRAN-ODT) 4 MG disintegrating tablet, Take 1 tablet (4 mg total) by mouth every 8 (eight) hours as needed for nausea or vomiting. 4mg  ODT q4 hours prn nausea/vomit, Disp: 30 tablet, Rfl: 1   pantoprazole (PROTONIX) 40 MG tablet, TAKE 1 TABLET BY MOUTH TWICE DAILY BEFORE A MEAL, Disp: 60 tablet, Rfl: 5   spironolactone (ALDACTONE) 100 MG tablet, Take 1 tablet (100 mg total) by mouth 2 (two) times daily., Disp: 60 tablet, Rfl: 3   TYLENOL 500 MG tablet, Take 1 tablet (500 mg total) by mouth every 6 (six) hours as needed for mild pain (pain score 1-3) or headache., Disp: 30 tablet, Rfl: 0   vitamin C (ASCORBIC ACID) 500 MG tablet, Take 500 mg by mouth daily. , Disp: , Rfl:    zinc gluconate 50 MG tablet, Take 50 mg by mouth daily., Disp: , Rfl:    Allergies: Allergies  Allergen Reactions   Hydromorphone Nausea And Vomiting and Other (See Comments)    B/P dropped also   Vicodin [Hydrocodone-Acetaminophen] Nausea And Vomiting and Other (See Comments)    "Made me deathly sick"   Codeine Nausea And Vomiting   Hydrocodone Nausea And Vomiting and Other (See Comments)    "Made me deathly sick"   Onion Other (See Comments)    Headaches    Tape Swelling and Other (See Comments)    Rash(paper tape)   Latex Rash    REVIEW OF SYSTEMS:   Review of Systems  Skin:  Positive for itching and rash.  Neurological:  Positive for dizziness.     VITALS:   There were no vitals taken for this visit.  Wt Readings from Last 3 Encounters:  08/07/23 281 lb 9.6 oz (127.7 kg)  07/26/23 266 lb  3.2 oz (120.7 kg)  07/10/23 264 lb 6.4 oz (119.9 kg)    There is no height or weight on file to calculate BMI.  Performance status (ECOG): 0 - Asymptomatic  PHYSICAL EXAM:   Physical Exam Constitutional:      Appearance: Normal appearance. She is obese.  Cardiovascular:     Rate and Rhythm:  Normal rate and regular rhythm.  Pulmonary:     Effort: Pulmonary effort is normal.     Breath sounds: Normal breath sounds.  Abdominal:     General: Bowel sounds are normal.     Palpations: Abdomen is soft.  Musculoskeletal:        General: No swelling. Normal range of motion.  Skin:    Findings: Erythema and rash present. Rash is macular, papular and scaling.     Comments: Right upper arm and left upper torso   Neurological:     Mental Status: She is alert and oriented to person, place, and time. Mental status is at baseline.     LABS:      Latest Ref Rng & Units 09/14/2023    2:32 PM 06/12/2023    1:01 PM 06/08/2023    3:50 AM  CBC  WBC 4.0 - 10.5 K/uL 4.5  5.7  3.6   Hemoglobin 12.0 - 15.0 g/dL 86.5  78.4  69.6   Hematocrit 36.0 - 46.0 % 36.8  34.7  30.8   Platelets 150 - 400 K/uL 46  79  45       Latest Ref Rng & Units 07/26/2023   12:04 PM 07/13/2023    1:14 PM 06/12/2023    1:01 PM  CMP  Glucose 70 - 99 mg/dL 295  284  132   BUN 6 - 20 mg/dL 11  8  13    Creatinine 0.44 - 1.00 mg/dL 4.40  1.02  7.25   Sodium 135 - 145 mmol/L 133  137  136   Potassium 3.5 - 5.1 mmol/L 4.5  4.3  4.1   Chloride 98 - 111 mmol/L 101  104  105   CO2 22 - 32 mmol/L 26  28  19    Calcium 8.9 - 10.3 mg/dL 9.4  9.3  9.7      No results found for: "CEA1", "CEA" / No results found for: "CEA1", "CEA" No results found for: "PSA1" No results found for: "CAN199" No results found for: "CAN125"  Lab Results  Component Value Date   TOTALPROTELP 5.9 (L) 04/09/2021   ALBUMINELP 3.1 04/09/2021   A1GS 0.2 04/09/2021   A2GS 0.4 04/09/2021   BETS 1.0 04/09/2021   GAMS 1.2 04/09/2021   MSPIKE Not Observed 04/09/2021   SPEI Comment 04/09/2021   Lab Results  Component Value Date   TIBC 353 08/11/2023   TIBC 312 06/12/2023   TIBC 318 09/18/2021   FERRITIN 18 08/11/2023   FERRITIN 32 06/12/2023   FERRITIN 23 09/18/2021   IRONPCTSAT 23 08/11/2023   IRONPCTSAT 35 (H) 06/12/2023   IRONPCTSAT 15  09/18/2021   Lab Results  Component Value Date   LDH 141 09/14/2023   LDH 178 03/03/2023   LDH 163 04/09/2021     STUDIES:   MM 3D DIAGNOSTIC MAMMOGRAM BILATERAL BREAST Result Date: 09/19/2023 CLINICAL DATA:  58 year old female with intermittent left nipple inversion. EXAM: DIGITAL DIAGNOSTIC BILATERAL MAMMOGRAM WITH TOMOSYNTHESIS AND CAD; ULTRASOUND RIGHT BREAST LIMITED TECHNIQUE: Bilateral digital diagnostic mammography and breast tomosynthesis was performed. The images were evaluated with computer-aided detection. ; Targeted ultrasound  examination of the right breast was performed COMPARISON:  Previous exam(s). ACR Breast Density Category b: There are scattered areas of fibroglandular density. FINDINGS: There is an oval mass in the upper slightly inner right breast containing internal fat density measuring approximately 0.7 cm. No suspicious masses or calcifications are seen in either breast. Slight flattening of the left nipple areolar complex is felt to be stable dating back to prior mammograms from October 2022. Physical examination reveals normal appearance of the left nipple areolar complex. There is mild inversion of the left nipple only which can easily evert. No underlying palpable masses. Targeted ultrasound of the right breast was performed demonstrating cysts several areas of generalized hyper echo ache fibroglandular tissue with multiple small cystic spaces at the 1 to 2 o'clock position with findings suggestive of areas of fat necrosis. A mixed echogenicity mass in the right breast at 1 o'clock 3 cm from nipple measures 0.8 x 0.4 x 0.8 cm. This might account for the fat containing mass seen in the upper inner right breast at mammography. An additional mass at 2 o'clock 8 cm from nipple possibly a cluster of cysts measures 1 x 0.3 x 1.3 cm. This corresponds with a stable mass seen in the upper inner right breast at mammography. IMPRESSION: Probably benign right breast mass in the upper  inner right breast felt to be related to an area of fat necrosis. RECOMMENDATION: Recommend six-month follow-up diagnostic mammography with ultrasound of the right breast. I have discussed the findings and recommendations with the patient. If applicable, a reminder letter will be sent to the patient regarding the next appointment. BI-RADS CATEGORY  3: Probably benign. Electronically Signed   By: Edwin Cap M.D.   On: 09/19/2023 14:54   [place] LIMITED ULTRASOUND INCLUDING AXILLA RIGHT BREAST Result Date: 09/19/2023 CLINICAL DATA:  58 year old female with intermittent left nipple inversion. EXAM: DIGITAL DIAGNOSTIC BILATERAL MAMMOGRAM WITH TOMOSYNTHESIS AND CAD; ULTRASOUND RIGHT BREAST LIMITED TECHNIQUE: Bilateral digital diagnostic mammography and breast tomosynthesis was performed. The images were evaluated with computer-aided detection. ; Targeted ultrasound examination of the right breast was performed COMPARISON:  Previous exam(s). ACR Breast Density Category b: There are scattered areas of fibroglandular density. FINDINGS: There is an oval mass in the upper slightly inner right breast containing internal fat density measuring approximately 0.7 cm. No suspicious masses or calcifications are seen in either breast. Slight flattening of the left nipple areolar complex is felt to be stable dating back to prior mammograms from October 2022. Physical examination reveals normal appearance of the left nipple areolar complex. There is mild inversion of the left nipple only which can easily evert. No underlying palpable masses. Targeted ultrasound of the right breast was performed demonstrating cysts several areas of generalized hyper echo ache fibroglandular tissue with multiple small cystic spaces at the 1 to 2 o'clock position with findings suggestive of areas of fat necrosis. A mixed echogenicity mass in the right breast at 1 o'clock 3 cm from nipple measures 0.8 x 0.4 x 0.8 cm. This might account for the fat  containing mass seen in the upper inner right breast at mammography. An additional mass at 2 o'clock 8 cm from nipple possibly a cluster of cysts measures 1 x 0.3 x 1.3 cm. This corresponds with a stable mass seen in the upper inner right breast at mammography. IMPRESSION: Probably benign right breast mass in the upper inner right breast felt to be related to an area of fat necrosis. RECOMMENDATION: Recommend six-month follow-up diagnostic mammography  with ultrasound of the right breast. I have discussed the findings and recommendations with the patient. If applicable, a reminder letter will be sent to the patient regarding the next appointment. BI-RADS CATEGORY  3: Probably benign. Electronically Signed   By: Edwin Cap M.D.   On: 09/19/2023 14:54

## 2023-09-29 ENCOUNTER — Ambulatory Visit (HOSPITAL_COMMUNITY)
Admission: RE | Admit: 2023-09-29 | Discharge: 2023-09-29 | Disposition: A | Discharge status: Home / Self Care | Source: Ambulatory Visit | Attending: Gastroenterology | Admitting: Gastroenterology

## 2023-09-29 ENCOUNTER — Other Ambulatory Visit: Payer: Self-pay | Admitting: Gastroenterology

## 2023-09-29 DIAGNOSIS — R188 Other ascites: Secondary | ICD-10-CM | POA: Diagnosis present

## 2023-09-29 MED ORDER — LIDOCAINE HCL (PF) 2 % IJ SOLN
INTRAMUSCULAR | Status: AC
  Filled 2023-09-29: qty 20

## 2023-09-29 NOTE — Procedures (Signed)
 Patient presents for therapeutic paracentesis. [place] limited abdomen shows trace amount of  fluid noted  Insufficient to perform a safe paracentesis. Procedure not performed.

## 2023-10-02 ENCOUNTER — Ambulatory Visit: Payer: Medicaid Other | Admitting: Gastroenterology

## 2023-10-02 ENCOUNTER — Other Ambulatory Visit (HOSPITAL_COMMUNITY)

## 2023-10-03 NOTE — Progress Notes (Unsigned)
 Referring Provider: Benita Stabile, MD Primary Care Physician:  Benita Stabile, MD Primary GI Physician: Dr. Marletta Lor  Chief Complaint  Patient presents with   Follow-up    Follow up. No problems     HPI:   Candice Stanley is a 58 y.o. female with history of type 2 diabetes, HTN, HLD, hypothyroidism, GERD with esophagitis, constipation, hemorrhoids, NASH cirrhosis with thrombocytopenia, esophageal varices, ascites, HE, following with [facility] for transplant evaluation, presenting today for 8 week follow-up.   Last seen 08/07/23. Mental status returned to baseline with lactulose 30 ml daily.Noted increased abdominal distension and felt she needed parancetesis. Had gained 15 lbs since I saw her 2 weeks ago. Had not been following low sodium diet following mothers passing and eating food that was brought to the family- a lot of pasta/tomato based sauces. Daughter stated [facility] had advised against TIPS. Also advised she would need to lose 50 lbs prior to transplant and had recommended resuming mounjaro. Recommended strict low sodium diet, US liver doppler and paracentesis. Nadolol also increased to 40 mg daily as HR wasn't at goal and BP ok. Reviewed labs showing chronic anemia/pancytopenia. She had no overt GI bleeding and no iron deficiency in December, B12 and folate normal in November. Recommended rechecking iron, B12, and folate.   Labs showed no B12, iron, or folate deficiency. Continue to follow with hepatology.   Paracentesis 08/11/23 yielding 1.3 L. No SBP.  Liver doppler with patent portal vein.    Patient sent mychart message on 09/29/23 reporting dizziness. She reported a sensation of the room spinning. Later checked her BP reporting 89/60, 88/57, and 86/55. Patient advised to stop nadolol, continue to monitor, ER precautions discussed. Daughter stated they had appointments with [facility] on 3/24 and would discuss with them.    Today: Ascites/peripheral edema:  Last paracentesis 08/11/23 yielding  1.3 L. Insufficient ascites 09/29/23.  Denies abdominal distension or LE edema. States she went to have fluid status checked because she had put some weight on overnight and was concerned it was fluid.   Wt Readings from Last 3 Encounters:  10/04/23 256 lb 12.8 oz (116.5 kg)  09/21/23 242 lb (109.8 kg)  08/07/23 281 lb 9.6 oz (127.7 kg)   Resumed Mounjaro. Tolerating ok.   HE:  None. Taking Lactulose 45 ml 2-3 times a day.  2 Bms per day.   Dizziness: Dizzy when changing positions, bending over, turning her head. Had nausea a couple of times. Feels like the room is spinning. Has meclazine for vertigo, but has never lasted this long.   Thinks she last took nadolol Monday morning.  Takes amlodipine as well.  Reports BP has been normal since Monday.    MELD 3.0 was 18 on 08/07/23. AFP: wnl  Hep A/B vaccination: Started Hep B vaccination with [facility]. Immune to Hep A   Energy levels are great, much better than they used to be.    EGD 06/26/23:  - Grade I esophageal varices.  - Portal hypertensive gastropathy.  - Gastritis. Biopsied.  - Normal duodenal bulb, first portion of the duodenum and second portion of the duodenum. - Pathology: Fundic type gastric mucosa showing minimal chronic gastritis with reactive epithelial changes negative for H pylori.    Last colonoscopy 09/23/2022: Nonbleeding internal hemorrhoids, diverticulosis in the sigmoid colon, two 4-5 mm polyps in the sigmoid colon resected and retrieved, stool in the entire examined colon that this was able to be cleared with fair visualization. Pathology with  tubular adenomas. Recommended 5-year surveillance.  Past Medical History:  Diagnosis Date   Anxiety    Cirrhosis (HCC)    Depression    GERD (gastroesophageal reflux disease)    with grade d esophagitis in March 2023.   Hypercholesteremia    Hypertension    Trimalleolar fracture of ankle, closed, right, sequela    Type 2 diabetes mellitus (HCC)     Past Surgical  History:  Procedure Laterality Date   ABDOMINAL HYSTERECTOMY     BIOPSY  09/21/2021   Procedure: BIOPSY;  Surgeon: Dolores Frame, MD;  [facility];  Service: Gastroenterology;;   BIOPSY  06/26/2023   Procedure: BIOPSY;  Surgeon: Lanelle Bal, DO;  [facility];  Service: Endoscopy;;   CARDIAC CATHETERIZATION N/A 09/10/2015   Procedure: Left Heart Cath and Coronary Angiography;  Surgeon: Corky Crafts, MD;  Location: [facility] INVASIVE CV LAB;  Service: Cardiovascular;  Laterality: N/A;   CHOLECYSTECTOMY     COLONOSCOPY WITH PROPOFOL N/A 09/23/2022   Procedure: COLONOSCOPY WITH PROPOFOL;  Surgeon: Lanelle Bal, DO;  [facility];  Service: Endoscopy;  Laterality: N/A;  1:15 pn, asa 3   ESOPHAGEAL BRUSHING  09/21/2021   Procedure: ESOPHAGEAL BRUSHING;  Surgeon: Marguerita Merles, Reuel Boom, MD;  [facility];  Service: Gastroenterology;;   ESOPHAGOGASTRODUODENOSCOPY (EGD) WITH PROPOFOL N/A 09/21/2021   Surgeon: Dolores Frame, MD;  grade D esophagitis with scant bleeding, KOH negative, could not rule out small varices, but no medium to large varices, mild portal hypertensive gastropathy.  Pathology showed mild foveolar hyperplasia, negative for H. pylori.  Recommended repeat EGD in 3 months.   ESOPHAGOGASTRODUODENOSCOPY (EGD) WITH PROPOFOL N/A 09/23/2022   Surgeon: Lanelle Bal, DO; grade 1 esophageal varices, portal hypertensive gastropathy.  Recommended considering nonselective beta-blocker for primary prophylaxis.   ESOPHAGOGASTRODUODENOSCOPY (EGD) WITH PROPOFOL N/A 06/26/2023   Procedure: ESOPHAGOGASTRODUODENOSCOPY (EGD) WITH PROPOFOL;  Surgeon: Lanelle Bal, DO;  [facility];  Service: Endoscopy;  Laterality: N/A;  245PM, ASA 3   OPEN REDUCTION INTERNAL FIXATION (ORIF) TIBIA/FIBULA FRACTURE Right 08/04/2016   Procedure: OPEN REDUCTION INTERNAL FIXATION (ORIF) RIGHT TRIMALLEOLAR FRACTURE;  Surgeon:  Toni Arthurs, MD;  Location: [facility];  Service: Orthopedics;  Laterality: Right;   POLYPECTOMY  09/23/2022   Procedure: POLYPECTOMY INTESTINAL;  Surgeon: Lanelle Bal, DO;  [facility];  Service: Endoscopy;;   WRIST SURGERY Left    ganglion cyst    Current Outpatient Medications  Medication Sig Dispense Refill   amLODipine (NORVASC) 5 MG tablet Take 2.5 mg by mouth daily.     calcium carbonate (TUMS - DOSED IN MG ELEMENTAL CALCIUM) 500 MG chewable tablet Chew 2 tablets by mouth daily as needed for indigestion or heartburn.     Cholecalciferol (VITAMIN D3) 50 MCG (2000 UT) TABS Take 2,000 Units by mouth daily.     docusate sodium (COLACE) 100 MG capsule Take 1 capsule (100 mg total) by mouth every 12 (twelve) hours. 20 capsule 0   FLUoxetine (PROZAC) 40 MG capsule Take 40 mg by mouth daily.     furosemide (LASIX) 40 MG tablet Take 1 tablet (40 mg total) by mouth 2 (two) times daily. 60 tablet 3   lactulose (CHRONULAC) 10 GM/15ML solution Take 30 mLs (20 g total) by mouth daily as needed for mild constipation. Goal of 2-3 soft bowel movements daily. (Patient taking differently: Take 45 g by mouth 3 (three) times daily. Goal of  2-3 soft bowel movements daily.) 946 mL 3   loratadine (CLARITIN) 10 MG tablet Take 10 mg by mouth daily.     metFORMIN (GLUCOPHAGE) 500 MG tablet Take 500 mg by mouth 2 (two) times daily with a meal.     MOUNJARO 10 MG/0.5ML Pen Inject 10 mg into the skin once a week.     mupirocin cream (BACTROBAN) 2 % Apply 1 Application topically 2 (two) times daily. 15 g 0   ondansetron (ZOFRAN-ODT) 4 MG disintegrating tablet Take 1 tablet (4 mg total) by mouth every 8 (eight) hours as needed for nausea or vomiting. 4mg  ODT q4 hours prn nausea/vomit 30 tablet 1   pantoprazole (PROTONIX) 40 MG tablet TAKE 1 TABLET BY MOUTH TWICE DAILY BEFORE A MEAL 60 tablet 5   spironolactone (ALDACTONE) 100 MG tablet Take 1 tablet (100 mg total) by mouth 2 (two) times  daily. 60 tablet 3   TYLENOL 500 MG tablet Take 1 tablet (500 mg total) by mouth every 6 (six) hours as needed for mild pain (pain score 1-3) or headache. 30 tablet 0   vitamin C (ASCORBIC ACID) 500 MG tablet Take 500 mg by mouth daily.      zinc gluconate 50 MG tablet Take 50 mg by mouth daily.     hydrocortisone (ANUSOL-HC) 25 MG suppository Place 1 suppository (25 mg total) rectally 2 (two) times daily. For 7 days 14 suppository 0   nadolol (CORGARD) 20 MG tablet Take 1 tablet (20 mg total) by mouth daily. 30 tablet 3   No current facility-administered medications for this visit.    Allergies as of 10/04/2023 - Review Complete 10/04/2023  Allergen Reaction Noted   Hydromorphone Nausea And Vomiting and Other (See Comments) 04/26/2023   Vicodin [hydrocodone-acetaminophen] Nausea And Vomiting and Other (See Comments) 03/05/2012   Codeine Nausea And Vomiting 11/03/2009   Hydrocodone Nausea And Vomiting and Other (See Comments) 04/26/2023   Onion Other (See Comments) 04/26/2023   Tape Swelling and Other (See Comments) 02/21/2015   Latex Rash 12/06/2016    Family History  Problem Relation Age of Onset   Hypertension Mother    Cirrhosis Mother        NASH   Coronary artery disease Father    Heart disease Father    Barrett's esophagus Father    Coronary artery disease Paternal Uncle    Coronary artery disease Cousin    Colon cancer Neg Hx     Social History   Socioeconomic History   Marital status: Legally Separated    Spouse name: Not on file   Number of children: 2   Years of education: Not on file   Highest education level: Not on file  Occupational History   Occupation: property Geologist, engineering: [employer]  Tobacco Use   Smoking status: Former    Current packs/day: 0.00    Average packs/day: 0.3 packs/day for 10.0 years (2.5 ttl pk-yrs)    Types: Cigarettes    Start date: 05/19/1981    Quit date: 05/20/1991    Years since quitting: 32.3   Smokeless tobacco:  Never   Tobacco comments:    Used to smoke a pack in 1-2 weeks/ 1992  Vaping Use   Vaping status: Never Used  Substance and Sexual Activity   Alcohol use: Not Currently   Drug use: No   Sexual activity: Yes    Birth control/protection: Surgical  Other Topics Concern   Not on file  Social History Narrative  Not on file   Social Drivers of Health   Financial Resource Strain: Not on file  Food Insecurity: No Food Insecurity (04/26/2023)   Hunger Vital Sign    Worried About Running Out of Food in the Last Year: Never true    Ran Out of Food in the Last Year: Never true  Transportation Needs: No Transportation Needs (04/26/2023)   PRAPARE - Administrator, Civil Service (Medical): No    Lack of Transportation (Non-Medical): No  Physical Activity: Not on file  Stress: Not on file  Social Connections: Not on file    Review of Systems: Gen: Denies fever, chills, cold or flu like symptoms, pre-syncope, or syncope.  CV: Denies chest pain, palpitations.  Resp: Denies dyspnea, cough.  GI: See HPI Heme: See HPI  Physical Exam: BP 127/76 (BP Location: Right Arm, Patient Position: Sitting, Cuff Size: Large)   Pulse 86   Temp 98.8 F (37.1 C) (Temporal)   Ht 5\' 4"  (1.626 m)   Wt 256 lb 12.8 oz (116.5 kg)   BMI 44.08 kg/m  General:   Alert and oriented. No distress noted. Pleasant and cooperative.  Head:  Normocephalic and atraumatic. Eyes:  Conjuctiva clear without scleral icterus. Heart:  S1, S2 present without murmurs appreciated. Lungs:  Clear to auscultation bilaterally. No wheezes, rales, or rhonchi. No distress.  Abdomen:  +BS, soft, non-tender and non-distended. Upper abdomen chronically more pronounced than lower abdomen. No rebound or guarding.  Msk:  Symmetrical without gross deformities. Normal posture. Extremities:  Without edema. Neurologic:  Alert and  oriented x4 Psych:  Normal mood and affect.    Assessment:  NASH Cirrhosis:  Decompensated  disease in the setting of thrombocytopenia, esophageal varices, ascites, and mild hepatic encephalopathy first noted this year.  MELD 3.0 was 18 on 08/07/23. Unfortunately, her liver disease had been progressing very quickly in the later part of 2024. She established with [facility] on 08/07/2023 and is now undergoing transplant evaluation.  Notably patient/patient daughter states that [facility] has recommended against TIPS.  Regarding vaccination status, patient had natural immune hepatitis A and has started hepatitis B vaccine series with [facility]  Ascites:  Improved with strict compliance with low sodium diet and diuretics. Last para was 08/11/23 yielding 1.3 L.   Hepatic Encephalopathy: New onset beginning of January 2025. Resolved with lactulose.   Esophageal varices:   Grade 1 esophageal varices noted on EGD 06/26/2023.  Had been on nadolol 20 mg daily.  Recommended increasing to 40 mg daily on 08/07/2023, but it is not clear that patient did this.  Additionally, patient contacted our office on 3/21 reporting dizzy spells.  Requested patient check her blood pressure and blood pressure was in the upper 80s over 50s.  She is advised to stop nadolol at that time.  Blood pressure much improved today at 127/76, pulse 86.  I am not sure why her blood pressure was so low.  I do wonder if it was an error as I suspect her dizzy spells are related to vertigo rather than hypertension.  After further discussion with the patient today, we will plan to resume nadolol 20 mg daily and have her check her blood pressure and heart rate every day.  Advised to notify me of any blood pressures dropping below 90/50.   Dizziness:  New onset dizziness for the last couple of weeks likely secondary to vertigo as symptoms are triggered by position changes and she describes a sensation of  the room spinning.  Associated nausea at times.  Nadolol was temporarily stopped over the weekend due to reports of hypotension, but  I am not sure if these readings were accurate.  Blood pressure looks good today, off nadolol.  We did perform orthostatic vitals today which were all normal the patient did experience dizziness when lying down and sitting up on exam table.  She was encouraged to look up Epley maneuver and follow-up with PCP.    Plan:  Continue Lasix 40 mg twice daily and spironolactone 100 mg twice daily. Strict 2 g/day sodium diet restriction. Continue lactulose 45 mL 3 times daily with a goal of 2-3 bowel movements per day. Resume nadolol 20 mg daily.  Requested patient to monitor her blood pressure and heart rate daily and notify me if blood pressure drops below 90/50. Encourage patient to walk up the Epley maneuver and try this to help with suspected vertigo. Recommended follow-up with PCP on dizziness/vertigo. Follow-up in our office in 3 months or sooner if needed. Continue following closely with [facility] for transplant evaluation.    Ermalinda Memos, PA-C [facility] Gastroenterology 10/04/2023

## 2023-10-04 ENCOUNTER — Encounter: Payer: Self-pay | Admitting: Gastroenterology

## 2023-10-04 ENCOUNTER — Ambulatory Visit (INDEPENDENT_AMBULATORY_CARE_PROVIDER_SITE_OTHER): Admitting: Gastroenterology

## 2023-10-04 VITALS — BP 127/76 | HR 86 | Temp 98.8°F | Ht 64.0 in | Wt 256.8 lb

## 2023-10-04 DIAGNOSIS — K7460 Unspecified cirrhosis of liver: Secondary | ICD-10-CM

## 2023-10-04 DIAGNOSIS — R42 Dizziness and giddiness: Secondary | ICD-10-CM | POA: Diagnosis not present

## 2023-10-04 DIAGNOSIS — I8500 Esophageal varices without bleeding: Secondary | ICD-10-CM | POA: Diagnosis not present

## 2023-10-04 DIAGNOSIS — R188 Other ascites: Secondary | ICD-10-CM

## 2023-10-04 DIAGNOSIS — K7682 Hepatic encephalopathy: Secondary | ICD-10-CM

## 2023-10-04 DIAGNOSIS — K7581 Nonalcoholic steatohepatitis (NASH): Secondary | ICD-10-CM | POA: Diagnosis not present

## 2023-10-04 MED ORDER — NADOLOL 20 MG PO TABS: 20.0000 mg | ORAL_TABLET | Freq: Every day | ORAL | 3 refills | Status: AC

## 2023-10-04 NOTE — Patient Instructions (Addendum)
 Continue lasix 40 mg BID and spironolactone 100 mg BID.   Continue lactulose 45 ml 3 times daily with a goal of 2-3 Bms per day.   Restart nadolol 20 mg daily. Monitor your blood pressure and heart rate daily. Notify me if top blood pressure number drops below 90 or heart rate is below 50.    Look up the Epley maneuver and try this for vertigo. I also recommend you follow-up with your primary care doctor for vertigo as I suspect this is the cause of your dizziness.    It was great to see you today!   I will plan to see you back in 3 months or sooner if needed.   Ermalinda Memos, PA-C [facility] Gastroenterology

## 2023-10-09 NOTE — Telephone Encounter (Signed)
 See separate MyChart message dated 10/09/2023.

## 2023-10-20 ENCOUNTER — Ambulatory Visit (HOSPITAL_COMMUNITY)
Admission: RE | Admit: 2023-10-20 | Discharge: 2023-10-20 | Disposition: A | Discharge status: Home / Self Care | Source: Ambulatory Visit | Attending: Gastroenterology | Admitting: Gastroenterology

## 2023-10-20 ENCOUNTER — Encounter (HOSPITAL_COMMUNITY): Payer: Self-pay

## 2023-10-20 DIAGNOSIS — R188 Other ascites: Secondary | ICD-10-CM | POA: Diagnosis present

## 2023-10-20 DIAGNOSIS — K7469 Other cirrhosis of liver: Secondary | ICD-10-CM | POA: Insufficient documentation

## 2023-10-20 DIAGNOSIS — K7581 Nonalcoholic steatohepatitis (NASH): Secondary | ICD-10-CM | POA: Insufficient documentation

## 2023-10-20 LAB — GRAM STAIN

## 2023-10-20 LAB — BODY FLUID CELL COUNT WITH DIFFERENTIAL
Eos, Fluid: 0 %
Lymphs, Fluid: 30 %
Monocyte-Macrophage-Serous Fluid: 46 % — ABNORMAL LOW (ref 50–90)
Neutrophil Count, Fluid: 24 % (ref 0–25)
Total Nucleated Cell Count, Fluid: 175 uL (ref 0–1000)

## 2023-10-20 MED ORDER — ALBUMIN HUMAN 25 % IV SOLN
25.0000 g | Freq: Once | INTRAVENOUS | Status: AC
  Administered 2023-10-20: 25 g via INTRAVENOUS

## 2023-10-20 MED ORDER — ALBUMIN HUMAN 25 % IV SOLN
INTRAVENOUS | Status: AC
  Filled 2023-10-20: qty 100

## 2023-10-20 NOTE — Procedures (Signed)
 Ultrasound-guided diagnostic and therapeutic paracentesis performed yielding 2 liters of straw colored fluid.  Fluid was sent to lab for analysis. No immediate complications. EBL is < 2 ml .  PLAN: The Patient is currently being worked up at [facility] for possible liver transplant. Patient states that per [facility] Transplant Team she is not a candidate for TIPS.

## 2023-10-20 NOTE — Progress Notes (Signed)
 Right sided paracentesis procedure and 25G of IV Albumin tolerated well today and 2 L of yellow ascites removed with labs sent for processing. PT verbalized understanding of discharge instructions and ambulatory at departure with no acute distress noted.

## 2023-10-24 LAB — PATHOLOGIST SMEAR REVIEW: Path Review: REACTIVE

## 2023-10-25 LAB — CULTURE, BODY FLUID W GRAM STAIN -BOTTLE: Culture: NO GROWTH

## 2023-11-15 ENCOUNTER — Other Ambulatory Visit: Payer: Self-pay | Admitting: Gastroenterology

## 2023-11-15 ENCOUNTER — Ambulatory Visit (HOSPITAL_COMMUNITY)
Admission: RE | Admit: 2023-11-15 | Discharge: 2023-11-15 | Disposition: A | Discharge status: Home / Self Care | Source: Ambulatory Visit | Attending: Gastroenterology | Admitting: Gastroenterology

## 2023-11-15 DIAGNOSIS — R188 Other ascites: Secondary | ICD-10-CM | POA: Insufficient documentation

## 2023-11-15 NOTE — Procedures (Signed)
 Patient presents today for possible paracentesis. Limited ultrasound examination reveals small volume peritoneal fluid. Fluid volume insufficient to safely perform procedure. No procedure performed.   Jetta Morrow, AGACNP-BC 11/15/2023, 11:14 AM

## 2023-11-28 ENCOUNTER — Other Ambulatory Visit: Payer: Self-pay | Admitting: Gastroenterology

## 2023-11-28 DIAGNOSIS — R188 Other ascites: Secondary | ICD-10-CM

## 2023-11-29 ENCOUNTER — Ambulatory Visit: Payer: Self-pay | Admitting: Gastroenterology

## 2023-11-29 ENCOUNTER — Ambulatory Visit (HOSPITAL_COMMUNITY)
Admission: RE | Admit: 2023-11-29 | Discharge: 2023-11-29 | Disposition: A | Discharge status: Home / Self Care | Source: Ambulatory Visit | Attending: Gastroenterology | Admitting: Gastroenterology

## 2023-11-29 ENCOUNTER — Encounter (HOSPITAL_COMMUNITY): Payer: Self-pay

## 2023-11-29 DIAGNOSIS — R188 Other ascites: Secondary | ICD-10-CM | POA: Diagnosis present

## 2023-11-29 DIAGNOSIS — K7460 Unspecified cirrhosis of liver: Secondary | ICD-10-CM | POA: Diagnosis not present

## 2023-11-29 LAB — BODY FLUID CELL COUNT WITH DIFFERENTIAL
Lymphs, Fluid: 44 %
Monocyte-Macrophage-Serous Fluid: 41 % — ABNORMAL LOW (ref 50–90)
Neutrophil Count, Fluid: 15 % (ref 0–25)
Total Nucleated Cell Count, Fluid: 175 uL (ref 0–1000)

## 2023-11-29 LAB — GRAM STAIN

## 2023-11-29 MED ORDER — ALBUMIN HUMAN 25 % IV SOLN
25.0000 g | Freq: Once | INTRAVENOUS | Status: AC
  Administered 2023-11-29: 25 g via INTRAVENOUS

## 2023-11-29 MED ORDER — ALBUMIN HUMAN 25 % IV SOLN
INTRAVENOUS | Status: AC
  Filled 2023-11-29: qty 100

## 2023-11-29 MED ORDER — LIDOCAINE HCL (PF) 2 % IJ SOLN
INTRAMUSCULAR | Status: AC
  Filled 2023-11-29: qty 10

## 2023-11-29 MED ORDER — LIDOCAINE HCL (PF) 2 % IJ SOLN
10.0000 mL | Freq: Once | INTRAMUSCULAR | Status: AC
  Administered 2023-11-29: 10 mL

## 2023-11-29 NOTE — Procedures (Signed)
 PROCEDURE SUMMARY:  Successful ultrasound guided paracentesis from the right lower quadrant.  Yielded 1.1 L of clear yellow fluid.  No immediate complications.  The patient tolerated the procedure well.   Specimen sent for labs.  EBL < 2 mL   ** The Patient is currently being worked up at [facility] for possible liver transplant. Patient states that per [facility] Transplant Team she is not a candidate for TIPS.   Jetta Morrow, AGACNP-BC 11/29/2023, 11:38 AM

## 2023-11-29 NOTE — Progress Notes (Signed)
 Patient tolerated right sided Paracentesis and 25G of IV albumin  well today and 1.1 Liters of yellow ascites removed with labs collected and sent for processing. Patient verbalized understanding of discharge instructions and ambulatory at departure with no acute distress noted.

## 2023-11-30 LAB — PATHOLOGIST SMEAR REVIEW

## 2023-12-04 LAB — CULTURE, BODY FLUID W GRAM STAIN -BOTTLE
Culture: NO GROWTH
Special Requests: ADEQUATE

## 2024-01-01 NOTE — Progress Notes (Unsigned)
 Referring Provider: Shona Norleen PEDLAR, MD Primary Care Physician:  Shona Norleen PEDLAR, MD Primary GI Physician: Dr. Cindie  No chief complaint on file.   HPI:   Candice Stanley is a 58 y.o. female with history of type 2 diabetes, HTN, HLD, hypothyroidism, GERD with esophagitis, constipation, hemorrhoids, NASH cirrhosis with thrombocytopenia, esophageal varices, ascites, HE, following with [facility] for transplant evaluation, presenting today for follow-up.    Today:  Ascites/peripheral edema:  Paracentesis 10/20/23 yielding 2 L.  No SBP. Paracentesis 11/29/2023 yielding 1.1 L.  No SBP. Patient states [facility] recommended against TIPS.   Wt Readings from Last 4 Encounters:  10/04/23 256 lb 12.8 oz (116.5 kg)  09/21/23 242 lb (109.8 kg)  08/07/23 281 lb 9.6 oz (127.7 kg)  07/26/23 266 lb 3.2 oz (120.7 kg)    HE:    Esophageal Varices:  Nadolol  previously stopped due to hypotension, but resumed on 10/04/23.     MELD 3.0: 19 on 11/20/2023. AFP:   Hep A/B vaccination: Started Hep B vaccination with [facility]. Immune to Hep A    EGD 06/26/23:  - Grade I esophageal varices.  - Portal hypertensive gastropathy.  - Gastritis. Biopsied.  - Normal duodenal bulb, first portion of the duodenum and second portion of the duodenum. - Pathology: Fundic type gastric mucosa showing minimal chronic gastritis with reactive epithelial changes negative for H pylori.    Last colonoscopy 09/23/2022: Nonbleeding internal hemorrhoids, diverticulosis in the sigmoid colon, two 4-5 mm polyps in the sigmoid colon resected and retrieved, stool in the entire examined colon that this was able to be cleared with fair visualization. Pathology with tubular adenomas. Recommended 5-year surveillance.   Reviewed most recent office visit with [facility] transplant dated 11/20/2023.  Stated patient needs to continue to make progress with obesity prior to active listing for liver transplant.   Past Medical History:  Diagnosis Date   Anxiety     Cirrhosis (HCC)    Depression    GERD (gastroesophageal reflux disease)    with grade d esophagitis in March 2023.   Hypercholesteremia    Hypertension    Trimalleolar fracture of ankle, closed, right, sequela    Type 2 diabetes mellitus (HCC)     Past Surgical History:  Procedure Laterality Date   ABDOMINAL HYSTERECTOMY     BIOPSY  09/21/2021   Procedure: BIOPSY;  Surgeon: Eartha Angelia Sieving, MD;  [facility];  Service: Gastroenterology;;   BIOPSY  06/26/2023   Procedure: BIOPSY;  Surgeon: Cindie Carlin POUR, DO;  [facility];  Service: Endoscopy;;   CARDIAC CATHETERIZATION N/A 09/10/2015   Procedure: Left Heart Cath and Coronary Angiography;  Surgeon: Candyce GORMAN Reek, MD;  Location: [facility];  Service: Cardiovascular;  Laterality: N/A;   CHOLECYSTECTOMY     COLONOSCOPY WITH PROPOFOL  N/A 09/23/2022   Procedure: COLONOSCOPY WITH PROPOFOL ;  Surgeon: Cindie Carlin POUR, DO;  [facility];  Service: Endoscopy;  Laterality: N/A;  1:15 pn, asa 3   ESOPHAGEAL BRUSHING  09/21/2021   Procedure: ESOPHAGEAL BRUSHING;  Surgeon: Eartha Angelia, Sieving, MD;  [facility];  Service: Gastroenterology;;   ESOPHAGOGASTRODUODENOSCOPY (EGD) WITH PROPOFOL  N/A 09/21/2021   Surgeon: Eartha Angelia Sieving, MD;  grade D esophagitis with scant bleeding, KOH negative, could not rule out small varices, but no medium to large varices, mild portal hypertensive gastropathy.  Pathology showed mild foveolar hyperplasia, negative for H. pylori.  Recommended repeat EGD in 3 months.  ESOPHAGOGASTRODUODENOSCOPY (EGD) WITH PROPOFOL  N/A 09/23/2022   Surgeon: Cindie Carlin POUR, DO; grade 1 esophageal varices, portal hypertensive gastropathy.  Recommended considering nonselective beta-blocker for primary prophylaxis.   ESOPHAGOGASTRODUODENOSCOPY (EGD) WITH PROPOFOL  N/A 06/26/2023   Procedure: ESOPHAGOGASTRODUODENOSCOPY (EGD) WITH PROPOFOL ;  Surgeon:  Cindie Carlin POUR, DO;  [facility];  Service: Endoscopy;  Laterality: N/A;  245PM, ASA 3   OPEN REDUCTION INTERNAL FIXATION (ORIF) TIBIA/FIBULA FRACTURE Right 08/04/2016   Procedure: OPEN REDUCTION INTERNAL FIXATION (ORIF) RIGHT TRIMALLEOLAR FRACTURE;  Surgeon: Norleen Armor, MD;  Location: [facility];  Service: Orthopedics;  Laterality: Right;   POLYPECTOMY  09/23/2022   Procedure: POLYPECTOMY INTESTINAL;  Surgeon: Cindie Carlin POUR, DO;  [facility];  Service: Endoscopy;;   WRIST SURGERY Left    ganglion cyst    Current Outpatient Medications  Medication Sig Dispense Refill   amLODipine  (NORVASC ) 5 MG tablet Take 2.5 mg by mouth daily.     calcium  carbonate (TUMS - DOSED IN MG ELEMENTAL CALCIUM ) 500 MG chewable tablet Chew 2 tablets by mouth daily as needed for indigestion or heartburn.     Cholecalciferol (VITAMIN D3) 50 MCG (2000 UT) TABS Take 2,000 Units by mouth daily.     docusate sodium  (COLACE) 100 MG capsule Take 1 capsule (100 mg total) by mouth every 12 (twelve) hours. 20 capsule 0   FLUoxetine  (PROZAC ) 40 MG capsule Take 40 mg by mouth daily.     furosemide  (LASIX ) 40 MG tablet Take 1 tablet by mouth twice daily 60 tablet 3   hydrocortisone  (ANUSOL -HC) 25 MG suppository Place 1 suppository (25 mg total) rectally 2 (two) times daily. For 7 days 14 suppository 0   lactulose  (CHRONULAC ) 10 GM/15ML solution Take 30 mLs (20 g total) by mouth daily as needed for mild constipation. Goal of 2-3 soft bowel movements daily. (Patient taking differently: Take 45 g by mouth 3 (three) times daily. Goal of 2-3 soft bowel movements daily.) 946 mL 3   loratadine  (CLARITIN ) 10 MG tablet Take 10 mg by mouth daily.     metFORMIN (GLUCOPHAGE) 500 MG tablet Take 500 mg by mouth 2 (two) times daily with a meal.     MOUNJARO 10 MG/0.5ML Pen Inject 10 mg into the skin once a week.     mupirocin  cream (BACTROBAN ) 2 % Apply 1 Application topically 2 (two) times daily. 15  g 0   nadolol  (CORGARD ) 20 MG tablet Take 1 tablet (20 mg total) by mouth daily. 30 tablet 3   ondansetron  (ZOFRAN -ODT) 4 MG disintegrating tablet Take 1 tablet (4 mg total) by mouth every 8 (eight) hours as needed for nausea or vomiting. 4mg  ODT q4 hours prn nausea/vomit 30 tablet 1   pantoprazole  (PROTONIX ) 40 MG tablet TAKE 1 TABLET BY MOUTH TWICE DAILY BEFORE A MEAL 60 tablet 5   spironolactone  (ALDACTONE ) 100 MG tablet Take 1 tablet by mouth twice daily 60 tablet 3   TYLENOL  500 MG tablet Take 1 tablet (500 mg total) by mouth every 6 (six) hours as needed for mild pain (pain score 1-3) or headache. 30 tablet 0   vitamin C (ASCORBIC ACID ) 500 MG tablet Take 500 mg by mouth daily.      zinc  gluconate 50 MG tablet Take 50 mg by mouth daily.     No current facility-administered medications for this visit.    Allergies as of 01/03/2024 - Review Complete 11/29/2023  Allergen Reaction Noted   Hydromorphone  Nausea And Vomiting and Other (See  Comments) 04/26/2023   Vicodin [hydrocodone -acetaminophen ] Nausea And Vomiting and Other (See Comments) 03/05/2012   Codeine Nausea And Vomiting 11/03/2009   Hydrocodone  Nausea And Vomiting and Other (See Comments) 04/26/2023   Onion Other (See Comments) 04/26/2023   Tape Swelling and Other (See Comments) 02/21/2015   Latex Rash 12/06/2016    Family History  Problem Relation Age of Onset   Hypertension Mother    Cirrhosis Mother        NASH   Coronary artery disease Father    Heart disease Father    Barrett's esophagus Father    Coronary artery disease Paternal Uncle    Coronary artery disease Cousin    Colon cancer Neg Hx     Social History   Socioeconomic History   Marital status: Legally Separated    Spouse name: Not on file   Number of children: 2   Years of education: Not on file   Highest education level: Not on file  Occupational History   Occupation: property Geologist, engineering: [employer]  Tobacco Use   Smoking status:  Former    Current packs/day: 0.00    Average packs/day: 0.3 packs/day for 10.0 years (2.5 ttl pk-yrs)    Types: Cigarettes    Start date: 05/19/1981    Quit date: 05/20/1991    Years since quitting: 32.6   Smokeless tobacco: Never   Tobacco comments:    Used to smoke a pack in 1-2 weeks/ 1992  Vaping Use   Vaping status: Never Used  Substance and Sexual Activity   Alcohol use: Not Currently   Drug use: No   Sexual activity: Yes    Birth control/protection: Surgical  Other Topics Concern   Not on file  Social History Narrative   Not on file   Social Drivers of Health   Financial Resource Strain: Not on file  Food Insecurity: No Food Insecurity (04/26/2023)   Hunger Vital Sign    Worried About Running Out of Food in the Last Year: Never true    Ran Out of Food in the Last Year: Never true  Transportation Needs: No Transportation Needs (04/26/2023)   PRAPARE - Administrator, Civil Service (Medical): No    Lack of Transportation (Non-Medical): No  Physical Activity: Not on file  Stress: Not on file  Social Connections: Not on file    Review of Systems: Gen: Denies fever, chills, anorexia. Denies fatigue, weakness, weight loss.  CV: Denies chest pain, palpitations, syncope, peripheral edema, and claudication. Resp: Denies dyspnea at rest, cough, wheezing, coughing up blood, and pleurisy. GI: Denies vomiting blood, jaundice, and fecal incontinence.   Denies dysphagia or odynophagia. Derm: Denies rash, itching, dry skin Psych: Denies depression, anxiety, memory loss, confusion. No homicidal or suicidal ideation.  Heme: Denies bruising, bleeding, and enlarged lymph nodes.  Physical Exam: There were no vitals taken for this visit. General:   Alert and oriented. No distress noted. Pleasant and cooperative.  Head:  Normocephalic and atraumatic. Eyes:  Conjuctiva clear without scleral icterus. Heart:  S1, S2 present without murmurs appreciated. Lungs:  Clear to  auscultation bilaterally. No wheezes, rales, or rhonchi. No distress.  Abdomen:  +BS, soft, non-tender and non-distended. No rebound or guarding. No HSM or masses noted. Msk:  Symmetrical without gross deformities. Normal posture. Extremities:  Without edema. Neurologic:  Alert and  oriented x4 Psych:  Normal mood and affect.    Assessment:     Plan:  ***  Josette Centers, PA-C [facility] 01/03/2024

## 2024-01-03 ENCOUNTER — Ambulatory Visit (INDEPENDENT_AMBULATORY_CARE_PROVIDER_SITE_OTHER): Admitting: Gastroenterology

## 2024-01-03 ENCOUNTER — Encounter: Payer: Self-pay | Admitting: Gastroenterology

## 2024-01-03 VITALS — BP 119/74 | HR 61 | Temp 97.9°F | Ht 64.0 in | Wt 242.6 lb

## 2024-01-03 DIAGNOSIS — K7460 Unspecified cirrhosis of liver: Secondary | ICD-10-CM

## 2024-01-03 DIAGNOSIS — R188 Other ascites: Secondary | ICD-10-CM | POA: Diagnosis not present

## 2024-01-03 DIAGNOSIS — K7581 Nonalcoholic steatohepatitis (NASH): Secondary | ICD-10-CM | POA: Diagnosis not present

## 2024-01-03 DIAGNOSIS — K219 Gastro-esophageal reflux disease without esophagitis: Secondary | ICD-10-CM

## 2024-01-03 DIAGNOSIS — I8500 Esophageal varices without bleeding: Secondary | ICD-10-CM | POA: Diagnosis not present

## 2024-01-03 DIAGNOSIS — D696 Thrombocytopenia, unspecified: Secondary | ICD-10-CM

## 2024-01-03 DIAGNOSIS — K7682 Hepatic encephalopathy: Secondary | ICD-10-CM

## 2024-01-03 MED ORDER — RIFAXIMIN 550 MG PO TABS: 550.0000 mg | ORAL_TABLET | Freq: Two times a day (BID) | ORAL | 3 refills | Status: DC

## 2024-01-03 NOTE — Patient Instructions (Addendum)
 I am starting you on Xifaxan for history of hepatic encephalopathy as lactulose  causes too much diarrhea.   You can continue to use lactulose  as needed for constipation.   Continue spironolactone  100 mg twice daily and lasix  40 mg twice daily.   Continue nadolol  20 mg daily.   Nutrition:  High-protein diet from a primarily plant-based diet. Avoid red meat.  No raw or undercooked meat, seafood, or shellfish. Low-fat/cholesterol/carbohydrate diet. Limit sodium to no more than 2000 mg/day including everything that you eat and drink. Recommend at least 30 minutes of aerobic and resistance exercise 3 days/week.  I will plan to see you back in 6 months or sooner if needed. Do not hesitate to call with questions or concerns.    Josette Centers, PA-C [facility]

## 2024-02-15 NOTE — Telephone Encounter (Signed)
 Charmaine, can you reach out to patient to see what patient is needing?

## 2024-02-29 ENCOUNTER — Other Ambulatory Visit (HOSPITAL_COMMUNITY): Payer: Self-pay | Admitting: Internal Medicine

## 2024-02-29 DIAGNOSIS — N63 Unspecified lump in unspecified breast: Secondary | ICD-10-CM

## 2024-03-04 ENCOUNTER — Other Ambulatory Visit: Payer: Self-pay | Admitting: Gastroenterology

## 2024-03-04 ENCOUNTER — Telehealth: Payer: Self-pay | Admitting: *Deleted

## 2024-03-04 DIAGNOSIS — R188 Other ascites: Secondary | ICD-10-CM

## 2024-03-04 MED ORDER — FUROSEMIDE 40 MG PO TABS: 40.0000 mg | ORAL_TABLET | Freq: Two times a day (BID) | ORAL | 5 refills | Status: DC

## 2024-03-04 NOTE — Telephone Encounter (Signed)
 Received refill request for lasix . Pt last OV 01/03/2024

## 2024-03-04 NOTE — Telephone Encounter (Signed)
 Rx sent.

## 2024-03-21 ENCOUNTER — Inpatient Hospital Stay

## 2024-03-26 ENCOUNTER — Ambulatory Visit (HOSPITAL_COMMUNITY)
Admission: RE | Admit: 2024-03-26 | Discharge: 2024-03-26 | Disposition: A | Discharge status: Home / Self Care | Source: Ambulatory Visit | Attending: Internal Medicine | Admitting: Internal Medicine

## 2024-03-26 ENCOUNTER — Inpatient Hospital Stay: Attending: Oncology

## 2024-03-26 DIAGNOSIS — D509 Iron deficiency anemia, unspecified: Secondary | ICD-10-CM | POA: Diagnosis not present

## 2024-03-26 DIAGNOSIS — Z7682 Awaiting organ transplant status: Secondary | ICD-10-CM | POA: Diagnosis not present

## 2024-03-26 DIAGNOSIS — N63 Unspecified lump in unspecified breast: Secondary | ICD-10-CM | POA: Diagnosis present

## 2024-03-26 DIAGNOSIS — Z803 Family history of malignant neoplasm of breast: Secondary | ICD-10-CM | POA: Insufficient documentation

## 2024-03-26 DIAGNOSIS — D72819 Decreased white blood cell count, unspecified: Secondary | ICD-10-CM | POA: Diagnosis present

## 2024-03-26 DIAGNOSIS — D61818 Other pancytopenia: Secondary | ICD-10-CM

## 2024-03-26 DIAGNOSIS — D6959 Other secondary thrombocytopenia: Secondary | ICD-10-CM | POA: Diagnosis not present

## 2024-03-26 LAB — CBC WITH DIFFERENTIAL/PLATELET
Abs Immature Granulocytes: 0.01 K/uL (ref 0.00–0.07)
Basophils Absolute: 0 K/uL (ref 0.0–0.1)
Basophils Relative: 1 %
Eosinophils Absolute: 0.1 K/uL (ref 0.0–0.5)
Eosinophils Relative: 2 %
HCT: 35 % — ABNORMAL LOW (ref 36.0–46.0)
Hemoglobin: 11.9 g/dL — ABNORMAL LOW (ref 12.0–15.0)
Immature Granulocytes: 0 %
Lymphocytes Relative: 29 %
Lymphs Abs: 0.8 K/uL (ref 0.7–4.0)
MCH: 36.8 pg — ABNORMAL HIGH (ref 26.0–34.0)
MCHC: 34 g/dL (ref 30.0–36.0)
MCV: 108.4 fL — ABNORMAL HIGH (ref 80.0–100.0)
Monocytes Absolute: 0.3 K/uL (ref 0.1–1.0)
Monocytes Relative: 10 %
Neutro Abs: 1.7 K/uL (ref 1.7–7.7)
Neutrophils Relative %: 58 %
Platelets: 37 K/uL — ABNORMAL LOW (ref 150–400)
RBC: 3.23 MIL/uL — ABNORMAL LOW (ref 3.87–5.11)
RDW: 14.5 % (ref 11.5–15.5)
WBC: 2.9 K/uL — ABNORMAL LOW (ref 4.0–10.5)
nRBC: 0 % (ref 0.0–0.2)

## 2024-03-26 LAB — LACTATE DEHYDROGENASE: LDH: 176 U/L (ref 98–192)

## 2024-03-27 ENCOUNTER — Ambulatory Visit (HOSPITAL_COMMUNITY)
Admission: RE | Admit: 2024-03-27 | Discharge: 2024-03-27 | Disposition: A | Discharge status: Home / Self Care | Source: Ambulatory Visit | Attending: Gastroenterology | Admitting: Gastroenterology

## 2024-03-27 ENCOUNTER — Encounter (HOSPITAL_COMMUNITY): Payer: Self-pay

## 2024-03-27 ENCOUNTER — Other Ambulatory Visit: Payer: Self-pay | Admitting: Gastroenterology

## 2024-03-27 DIAGNOSIS — R188 Other ascites: Secondary | ICD-10-CM | POA: Diagnosis present

## 2024-03-28 ENCOUNTER — Inpatient Hospital Stay (HOSPITAL_BASED_OUTPATIENT_CLINIC_OR_DEPARTMENT_OTHER): Admitting: Oncology

## 2024-03-28 VITALS — BP 123/64 | HR 84 | Temp 98.0°F | Resp 16 | Wt 255.5 lb

## 2024-03-28 DIAGNOSIS — D72819 Decreased white blood cell count, unspecified: Secondary | ICD-10-CM

## 2024-03-28 DIAGNOSIS — D509 Iron deficiency anemia, unspecified: Secondary | ICD-10-CM | POA: Diagnosis not present

## 2024-03-28 NOTE — Progress Notes (Signed)
 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 She had pneumonia with sepsis in March 2023 requiring admission.  She did not have any further infections after that. Denies any nosebleeds, bleeding per rectum or melena. She is currently working in home health. Labs from 8/25 showed platelet count of 37,000 hemoglobin of 11.9 and ANC of 11.7. Will consider splenic embolization if platelet count continues to drop. She is still on [facility] liver transplant list. Given drop in platelet count, will recommend repeating labs in 2 weeks. Iron deficiency anemia, unspecified iron deficiency anemia type Her iron levels may not been checked since January 2025.  I will add these on in 2 weeks and she has very platelet count is redrawn.   Orders Placed This Encounter  Procedures   Ferritin    Standing Status:   Future    Expected Date:   04/11/2024    Expiration Date:   07/10/2024   CBC with Differential/Platelet    Standing Status:   Future    Expected Date:   04/11/2024    Expiration Date:   07/10/2024   Copper , serum    Standing Status:   Future    Expected Date:   04/11/2024    Expiration Date:   07/10/2024   Vitamin B12    Standing Status:   Future    Expected Date:   04/11/2024    Expiration Date:   07/10/2024   Methylmalonic acid, serum    Standing Status:   Future    Expected Date:   04/11/2024    Expiration Date:   07/10/2024   Iron and TIBC    Standing Status:   Future    Expected Date:   04/11/2024    Expiration Date:   07/10/2024   Folate    Standing Status:   Future    Expected Date:   04/11/2024    Expiration Date:   07/10/2024   CBC with Differential    Standing Status:   Future    Expected Date:   04/11/2024    Expiration Date:   03/28/2025    INTERVAL HISTORY: Patient returns for follow-up.  Overall doing well.  States  she is still following up with [facility] liver transplant team.  Current MELD score is 16.  She denies any bright red blood per rectum, melena or hematochezia.  She denies any bruising.  We reviewed cbc and ldh.   SUMMARY OF HEMATOLOGIC HISTORY: Oncology History   No history exists.    1.  Leukopenia and thrombocytopenia: - Patient seen at the request of Dr. Milford office. - CBC on 03/31/2021 with white count 2.1, platelet count 48, ANC 900.  Hemoglobin was 11.7. - CBC on 12/30/2020 white count 2.5, platelet count 57, hemoglobin 12.5, ANC 1.2. - Ultrasound abdomen on 06/05/2018 with increased hepatic echogenicity, fatty infiltration and splenomegaly measuring 18.8 cm, volume 1943. - No bleeding although easy bruising.  No B symptoms or infections.  No history of transfusions or hepatitis. - Progressive thrombocytopenia and leukopenia since 2018. - BMBX on 04/21/2021 showed slightly hypercellular bone marrow with trilineage hematopoiesis.  Slight lymphocytosis of primarily small lymphoid cells with lack of significant aggregates.  Chromosome analysis is normal.  2.  Social/family history: - Lives at home with her husband.  She works as a Investment banker, corporate.  Denies any chemical exposure.  She is  a non-smoker. - Her mother hadsNash cirrhosis.  Maternal grandmother had breast cancer.  Paternal aunt had breast cancer.    CBC    Component Value Date/Time   WBC 2.9 (L) 03/26/2024 1036   RBC 3.23 (L) 03/26/2024 1036   HGB 11.9 (L) 03/26/2024 1036   HCT 35.0 (L) 03/26/2024 1036   PLT 37 (L) 03/26/2024 1036   MCV 108.4 (H) 03/26/2024 1036   MCH 36.8 (H) 03/26/2024 1036   MCHC 34.0 03/26/2024 1036   RDW 14.5 03/26/2024 1036   LYMPHSABS 0.8 03/26/2024 1036   MONOABS 0.3 03/26/2024 1036   EOSABS 0.1 03/26/2024 1036   BASOSABS 0.0 03/26/2024 1036       Latest Ref Rng & Units 07/26/2023   12:04 PM 07/13/2023    1:14 PM 06/12/2023    1:01 PM  CMP  Glucose 70 - 99 mg/dL 840  871  793   BUN 6 - 20  mg/dL 11  8  13    Creatinine 0.44 - 1.00 mg/dL 9.27  9.33  9.27   Sodium 135 - 145 mmol/L 133  137  136   Potassium 3.5 - 5.1 mmol/L 4.5  4.3  4.1   Chloride 98 - 111 mmol/L 101  104  105   CO2 22 - 32 mmol/L 26  28  19    Calcium  8.9 - 10.3 mg/dL 9.4  9.3  9.7      Lab Results  Component Value Date   FERRITIN 18 08/11/2023   VITAMINB12 701 08/11/2023    Vitals:   03/28/24 1408  BP: 123/64  Pulse: 84  Resp: 16  Temp: 98 F (36.7 C)  SpO2: 99%    Review of System:  Review of Systems  Constitutional:  Positive for malaise/fatigue.    Physical Exam: Physical Exam Constitutional:      Appearance: Normal appearance.  HENT:     Head: Normocephalic and atraumatic.  Eyes:     Pupils: Pupils are equal, round, and reactive to light.  Cardiovascular:     Rate and Rhythm: Normal rate and regular rhythm.     Heart sounds: Normal heart sounds. No murmur heard. Pulmonary:     Effort: Pulmonary effort is normal.     Breath sounds: Normal breath sounds. No wheezing.  Abdominal:     General: Bowel sounds are normal. There is no distension.     Palpations: Abdomen is soft.     Tenderness: There is no abdominal tenderness.  Musculoskeletal:        General: Normal range of motion.     Cervical back: Normal range of motion.  Skin:    General: Skin is warm and dry.     Findings: No rash.  Neurological:     Mental Status: She is alert and oriented to person, place, and time.     Gait: Gait is intact.  Psychiatric:        Mood and Affect: Mood and affect normal.        Cognition and Memory: Memory normal.        Judgment: Judgment normal.      I spent 25 minutes dedicated to the care of this patient (face-to-face and non-face-to-face) on the date of the encounter to include what is described in the assessment and plan.,  Delon Hope, NP 03/28/2024 5:52 PM

## 2024-03-28 NOTE — Assessment & Plan Note (Addendum)
 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 She had pneumonia with sepsis in March 2023 requiring admission.  She did not have any further infections after that. Denies any nosebleeds, bleeding per rectum or melena. She is currently working in home health. Labs from 8/25 showed platelet count of 37,000 hemoglobin of 11.9 and ANC of 11.7. Will consider splenic embolization if platelet count continues to drop. She is still on [facility] liver transplant list. Given drop in platelet count, will recommend repeating labs in 2 weeks.

## 2024-03-28 NOTE — Assessment & Plan Note (Addendum)
 Her iron levels may not been checked since January 2025.  I will add these on in 2 weeks and she has very platelet count is redrawn.

## 2024-04-01 ENCOUNTER — Ambulatory Visit (HOSPITAL_COMMUNITY): Admission: RE | Admit: 2024-04-01 | Source: Ambulatory Visit

## 2024-04-10 ENCOUNTER — Telehealth: Payer: Self-pay

## 2024-04-10 ENCOUNTER — Other Ambulatory Visit (HOSPITAL_COMMUNITY): Payer: Self-pay | Admitting: Transplant Hepatology

## 2024-04-10 DIAGNOSIS — K7460 Unspecified cirrhosis of liver: Secondary | ICD-10-CM

## 2024-04-10 NOTE — Telephone Encounter (Signed)
 Message sent to you, pt was only seen by Josette Centers:  Sueanne from the [employer] (she is the coordinator) wants to speak with a provider regarding the pt having a updated EGD. Her phone number is [phone]. Please advise

## 2024-04-11 ENCOUNTER — Inpatient Hospital Stay: Attending: Oncology

## 2024-04-11 NOTE — Telephone Encounter (Signed)
 Tammy, I left message with Lolita for return call. I read last ov note, egd recommended for variceal screening/banding and patient requested to have done locally with Dr. Cindie.   I asked her to call back to make sure there was not any additional information that she needed to provide us .

## 2024-04-11 NOTE — Telephone Encounter (Signed)
 noted

## 2024-04-12 ENCOUNTER — Ambulatory Visit (HOSPITAL_COMMUNITY)
Admission: RE | Admit: 2024-04-12 | Discharge: 2024-04-12 | Disposition: A | Discharge status: Home / Self Care | Source: Ambulatory Visit | Attending: Transplant Hepatology | Admitting: Transplant Hepatology

## 2024-04-12 DIAGNOSIS — K7460 Unspecified cirrhosis of liver: Secondary | ICD-10-CM | POA: Insufficient documentation

## 2024-04-12 MED ORDER — GADOBUTROL 1 MMOL/ML IV SOLN
10.0000 mL | Freq: Once | INTRAVENOUS | Status: AC | PRN
  Administered 2024-04-12: 10 mL via INTRAVENOUS

## 2024-04-15 ENCOUNTER — Ambulatory Visit: Payer: Self-pay | Admitting: Gastroenterology

## 2024-04-16 NOTE — Telephone Encounter (Signed)
 Patient has upcoming ov with me. We will schedule EGD at that time.

## 2024-04-19 ENCOUNTER — Other Ambulatory Visit: Payer: Self-pay | Admitting: Gastroenterology

## 2024-04-19 DIAGNOSIS — K2100 Gastro-esophageal reflux disease with esophagitis, without bleeding: Secondary | ICD-10-CM

## 2024-04-23 ENCOUNTER — Encounter: Payer: Self-pay | Admitting: *Deleted

## 2024-04-24 ENCOUNTER — Telehealth: Payer: Self-pay | Admitting: *Deleted

## 2024-04-24 DIAGNOSIS — K2100 Gastro-esophageal reflux disease with esophagitis, without bleeding: Secondary | ICD-10-CM

## 2024-04-24 MED ORDER — PANTOPRAZOLE SODIUM 40 MG PO TBEC: 40.0000 mg | DELAYED_RELEASE_TABLET | Freq: Two times a day (BID) | ORAL | 5 refills | Status: DC

## 2024-04-24 NOTE — Addendum Note (Signed)
 Addended by: EZZARD SONNY RAMAN on: 04/24/2024 01:20 PM   Modules accepted: Orders

## 2024-04-24 NOTE — Telephone Encounter (Signed)
 Received refill request for pantoprazole . Pt last OV 01/03/2024

## 2024-04-27 NOTE — Progress Notes (Signed)
 Reviewed MRI from outside showing no liver masses/HCC

## 2024-05-06 ENCOUNTER — Telehealth: Payer: Self-pay

## 2024-05-06 NOTE — Telephone Encounter (Signed)
 Please see phone note dated 10/27.

## 2024-05-06 NOTE — Telephone Encounter (Signed)
 I have never personally seen patient. Is she feeling like her abdomen is swelling again?   She has seen mainly Kristen in the past and Mustang. I don't feel comfortable ordering this without her being seen in clinic or ordered by someone who has seen her previously.

## 2024-05-06 NOTE — Telephone Encounter (Signed)
 Pt phoned advising that her orders Josette Centers put in for her to have fluid drawn off were cancelled because they were in her name. She needs orders put in and she has an appt with [name] on 11/18/ 2025. Please advise

## 2024-05-07 ENCOUNTER — Telehealth: Payer: Self-pay | Admitting: *Deleted

## 2024-05-07 NOTE — Telephone Encounter (Signed)
 noted

## 2024-05-07 NOTE — Telephone Encounter (Signed)
 Noted

## 2024-05-07 NOTE — Telephone Encounter (Signed)
 Please schedule LVAP up to four liters Send fluid for cell count with diff. Hold lasix  and spironolactone  day of tap.

## 2024-05-07 NOTE — Telephone Encounter (Signed)
Noted, already taken care of.  

## 2024-05-07 NOTE — Telephone Encounter (Signed)
 Pt called and needs new prescription sent to [facility] for Xifaxan  550 mg. It can not be E-scribed, it needs to be faxed. Please advise.

## 2024-05-07 NOTE — Telephone Encounter (Signed)
 Spoke to pt, was informed that she had a para scheduled for tomorrow. Pt  says I know but  she was wanting standing orders for the para. Informed pt that provider states she needs to see her before standing orders for  Paracentesis will be given. Pt verbalized understanding.

## 2024-05-08 ENCOUNTER — Encounter (HOSPITAL_COMMUNITY): Payer: Self-pay

## 2024-05-08 ENCOUNTER — Ambulatory Visit (HOSPITAL_COMMUNITY)
Admission: RE | Admit: 2024-05-08 | Discharge: 2024-05-08 | Disposition: A | Discharge status: Home / Self Care | Source: Ambulatory Visit | Attending: Gastroenterology | Admitting: Gastroenterology

## 2024-05-08 DIAGNOSIS — R188 Other ascites: Secondary | ICD-10-CM | POA: Diagnosis present

## 2024-05-08 DIAGNOSIS — K7460 Unspecified cirrhosis of liver: Secondary | ICD-10-CM | POA: Diagnosis not present

## 2024-05-08 LAB — GRAM STAIN

## 2024-05-08 LAB — BODY FLUID CELL COUNT WITH DIFFERENTIAL
Eos, Fluid: 1 %
Lymphs, Fluid: 55 %
Monocyte-Macrophage-Serous Fluid: 31 % — ABNORMAL LOW (ref 50–90)
Neutrophil Count, Fluid: 13 % (ref 0–25)
Total Nucleated Cell Count, Fluid: 168 uL (ref 0–1000)

## 2024-05-08 MED ORDER — LIDOCAINE HCL (PF) 2 % IJ SOLN
INTRAMUSCULAR | Status: AC
  Filled 2024-05-08: qty 10

## 2024-05-08 MED ORDER — ALBUMIN HUMAN 25 % IV SOLN
INTRAVENOUS | Status: AC
  Filled 2024-05-08: qty 100

## 2024-05-08 MED ORDER — ALBUMIN HUMAN 25 % IV SOLN
25.0000 g | Freq: Once | INTRAVENOUS | Status: AC
  Administered 2024-05-08: 25 g via INTRAVENOUS

## 2024-05-08 NOTE — Procedures (Signed)
 PROCEDURE SUMMARY:  Successful US  guided paracentesis from left lateral abdomen.  Yielded 3.4 liters of yellow fluid.  No immediate complications.  Pt tolerated well.   Specimen was sent for labs.  EBL < 5mL  Solmon Selmer Ku PA-C 05/08/2024 3:13 PM

## 2024-05-08 NOTE — Progress Notes (Signed)
 Patient tolerated left sided Paracentesis and 25G of IV albumin  well today and 3.4 Liters of clear yellow ascites removed with labs collected and sent for processing. Patient verbalized understanding of discharge instructions and ambulatory at departure with no acute distress noted.

## 2024-05-09 LAB — PATHOLOGIST SMEAR REVIEW

## 2024-05-10 ENCOUNTER — Encounter: Payer: Self-pay | Admitting: *Deleted

## 2024-05-13 ENCOUNTER — Telehealth: Payer: Self-pay | Admitting: *Deleted

## 2024-05-13 ENCOUNTER — Ambulatory Visit: Payer: Self-pay | Admitting: Gastroenterology

## 2024-05-13 ENCOUNTER — Other Ambulatory Visit: Payer: Self-pay | Admitting: *Deleted

## 2024-05-13 DIAGNOSIS — R188 Other ascites: Secondary | ICD-10-CM

## 2024-05-13 NOTE — Telephone Encounter (Signed)
 I'm not sure when culture was positive, tap was done last Wednesday.   Her cell count was not suggestive of SBP.  Her gram stain was negative. I suspect her blood culture bottle was positive due to skin contaminant.  Please reach out to patient and ask if she is having any fever or abdominal pain.   She needs to go for another tap to make sure no signs of infection.  Please arrange for lvap up to 4 liters. Send fluid for cell count with diff, gram stain, and body fluid culture.

## 2024-05-13 NOTE — Telephone Encounter (Signed)
 Spoke with Tillman from central scheduling and scheduled for 11/6, arrival 12:45pm. Called pt and she is aware of appt details.

## 2024-05-13 NOTE — Telephone Encounter (Signed)
 Received call report from [facility] lab. Patient Aerobic bottle from para was gram positive cocci

## 2024-05-13 NOTE — Telephone Encounter (Signed)
 Spoke with pt and she stated that she does not have a fever and is not having any abdominal pain. Pt was made aware of needing to go for another para.

## 2024-05-15 LAB — CULTURE, BODY FLUID W GRAM STAIN -BOTTLE: Gram Stain: NO GROWTH

## 2024-05-16 ENCOUNTER — Ambulatory Visit (HOSPITAL_COMMUNITY)

## 2024-05-17 ENCOUNTER — Other Ambulatory Visit: Payer: Self-pay | Admitting: Gastroenterology

## 2024-05-17 ENCOUNTER — Ambulatory Visit (HOSPITAL_COMMUNITY)
Admission: RE | Admit: 2024-05-17 | Discharge: 2024-05-17 | Disposition: A | Discharge status: Home / Self Care | Source: Ambulatory Visit | Attending: Gastroenterology | Admitting: Gastroenterology

## 2024-05-17 ENCOUNTER — Ambulatory Visit: Payer: Self-pay | Admitting: Gastroenterology

## 2024-05-17 DIAGNOSIS — R188 Other ascites: Secondary | ICD-10-CM | POA: Diagnosis present

## 2024-05-17 MED ORDER — LIDOCAINE HCL (PF) 2 % IJ SOLN
INTRAMUSCULAR | Status: AC
  Filled 2024-05-17: qty 10

## 2024-05-17 NOTE — Progress Notes (Signed)
 Limited abdominal US  of all 4 quadrants shows scant ascites which is not amenable to safe percutaneous paracentesis.  No procedure performed. Imaging results discussed with patient.  Images are available for review under imaging section of Epic.  Please call with questions or concerns.  Candice Stanley

## 2024-05-24 ENCOUNTER — Other Ambulatory Visit: Payer: Self-pay | Admitting: Internal Medicine

## 2024-05-24 DIAGNOSIS — K2100 Gastro-esophageal reflux disease with esophagitis, without bleeding: Secondary | ICD-10-CM

## 2024-05-24 DIAGNOSIS — I8500 Esophageal varices without bleeding: Secondary | ICD-10-CM

## 2024-05-24 DIAGNOSIS — K7460 Unspecified cirrhosis of liver: Secondary | ICD-10-CM

## 2024-05-24 DIAGNOSIS — K7682 Hepatic encephalopathy: Secondary | ICD-10-CM

## 2024-05-28 ENCOUNTER — Ambulatory Visit: Admitting: Gastroenterology

## 2024-05-28 ENCOUNTER — Encounter: Payer: Self-pay | Admitting: Gastroenterology

## 2024-05-28 VITALS — BP 103/68 | HR 68 | Temp 98.9°F | Ht 64.0 in | Wt 261.4 lb

## 2024-05-28 DIAGNOSIS — D696 Thrombocytopenia, unspecified: Secondary | ICD-10-CM | POA: Diagnosis not present

## 2024-05-28 DIAGNOSIS — I8500 Esophageal varices without bleeding: Secondary | ICD-10-CM

## 2024-05-28 DIAGNOSIS — K219 Gastro-esophageal reflux disease without esophagitis: Secondary | ICD-10-CM

## 2024-05-28 DIAGNOSIS — R188 Other ascites: Secondary | ICD-10-CM

## 2024-05-28 DIAGNOSIS — K7460 Unspecified cirrhosis of liver: Secondary | ICD-10-CM

## 2024-05-28 NOTE — Progress Notes (Signed)
 GI Office Note    Referring Provider: Shona Norleen PEDLAR, MD Primary Care Physician:  Shona Norleen PEDLAR, MD  Primary Gastroenterologist: Carlin POUR. Cindie, DO   Chief Complaint   Chief Complaint  Patient presents with   Follow-up    Pt says she needs to have EGD    History of Present Illness   Denecia Brunette Steffy is a 58 y.o. female presenting today for follow up. Last seen 12/2023. History of type 2 diabetes, HTN, HLD, hypothyroidism, GERD with esophagitis, constipation, hemorrhoids, NASH cirrhosis with thrombocytopenia, esophageal varices, ascites, HE, following with [facility] for transplant evaluation, presenting today for follow-up stating [facility] wants her to have an EGD.   She was last seen in our office in 12/2023.    Discussed the use of AI scribe software for clinical note transcription with the patient, who gave verbal consent to proceed.  History of Present Illness Bethaney Oshana Shiveley is a 58 year old female with liver disease who presents for an upper endoscopy.  Patient was last seen by our practice in June.  She has a history of ascites/peripheral edema taking Lasix  40 mg twice daily and spironolactone  100 mg twice daily, no significant anasarca at time of last office visit.  She had paracentesis in April and May of this year yielding 2 L and 1.1 L respectively.  [facility] recommended against TIPS.  Previously did not tolerate lactulose  and has been on Xifaxan .  History of grade 1 esophageal varices in December 2024, nadolol  previously stopped due to hypotension but was resumed in March.  Started hepatitis B vaccination with [facility].  Back in May her MELD 3.0 was 19.    Patient states that she has been asked to lose 50 pounds prior to consideration of listing for liver transplant.   At her last visit at the end of September, she was told there was a possibility of being on the transplant list by the end of the year. She has lost 48 pounds in an effort to qualify for the transplant, though she struggles with  fluid retention affecting her weight. She faces dietary challenges, including difficulty consuming enough protein and managing fluid intake. She uses furosemide  40 mg twice daily and spironolactone  100 mg twice daily to manage fluid retention.  Her last paracentesis was November 29 at which time 3.4 L was removed.  Her cell count with differential showed total nucleated cells 168 with 13% neutrophils, Gram stain negative.  The aerobic bottle grew gram-positive cocci consistent with Staphylococcus epidermidis.  We suspected contaminated specimen.  Patient clinically had no signs of SBP.  We sent her for repeat tap but there was not adequate fluid to obtain a diagnostic tap.    This past week she did not feel as well as usual, experiencing fatigue and abdominal distension.  She mentioned that she missed all of her medications for several days, simply forgetting to refill her pill dispenser.  She is feeling better now that she has resumed her medications.   She is not experiencing constipation or bowel movement issues, typically having two to three bowel movements daily.  She has not used lactulose  recently.  She takes Zofran  twice daily. No blood in stool or melena.  She has a history of low blood pressure, leading to discontinuing her nadolol  previously.  She apparently resumed this several months back but according to her last liver transplant note nadolol  was stopped.  Patient inquired about getting a refill and she was started on a  lower dose   Her family history includes cirrhosis, as both her mother and uncle passed away from the condition. She is currently dealing with the recent unexpected death of her father-in-law, adding stress to her situation.  She has been trying to get back into church and has been more active, attending her granddaughter's sports events and walking more.   Wt Readings from Last 3 Encounters:  05/28/24 261 lb 6.4 oz (118.6 kg)  03/28/24 255 lb 8.2 oz (115.9 kg)   01/03/24 242 lb 9.6 oz (110 kg)     Prior Data     Results    From April 08, 2024: INR 1.49, sodium 140, potassium 4.4, creatinine 0.86, BUN 9, alb 3, Tbili 5, AST 47, ALT 23, AP 147, WBC 4.8, Hgb 13.3, MCV 104.7, Plt 54, alb 3  MELD 3.0 of 19  MRI 04/2024: IMPRESSION: 1. Cirrhotic liver configuration with sequela of portal venous hypertension including splenomegaly, mild ascites and multiple venous collaterals. No suspicious liver lesion seen. 2. There is a 1.7 x 2.4 cm left adrenal adenoma. 3. Multiple other nonacute observations, as described above.    EGD 06/26/23:  - Grade I esophageal varices.  - Portal hypertensive gastropathy.  - Gastritis. Biopsied.  - Normal duodenal bulb, first portion of the duodenum and second portion of the duodenum. - Pathology: Fundic type gastric mucosa showing minimal chronic gastritis with reactive epithelial changes negative for H pylori.    Last colonoscopy 09/23/2022: Nonbleeding internal hemorrhoids, diverticulosis in the sigmoid colon, two 4-5 mm polyps in the sigmoid colon resected and retrieved, stool in the entire examined colon that this was able to be cleared with fair visualization. Pathology with tubular adenomas. Recommended 5-year surveillance.    Medications   Current Outpatient Medications  Medication Sig Dispense Refill   ACCU-CHEK GUIDE TEST test strip SMARTSIG:1 Strip(s) 1-2 Times Daily     Accu-Chek Softclix Lancets lancets SMARTSIG:1 Lancet(s) Topical 1-2 Times Daily     ALPRAZolam  (XANAX ) 0.5 MG tablet Take 0.5 mg by mouth 2 (two) times daily as needed.     amLODipine  (NORVASC ) 2.5 MG tablet Take 2.5 mg by mouth daily.     Ascorbic Acid  100 MG CHEW Take 1 daily Take 1 daily     calcium  carbonate (TUMS - DOSED IN MG ELEMENTAL CALCIUM ) 500 MG chewable tablet Chew 2 tablets by mouth daily as needed for indigestion or heartburn.     Cholecalciferol (VITAMIN D3) 50 MCG (2000 UT) TABS Take 2,000 Units by mouth  daily.     FLUoxetine  (PROZAC ) 40 MG capsule Take 40 mg by mouth daily.     furosemide  (LASIX ) 40 MG tablet TAKE 1 TABLET BY MOUTH TWICE DAILY *NEW PRESCRIPTION REQUEST* 180 tablet 1   lactulose  (CHRONULAC ) 10 GM/15ML solution Take 30 mLs (20 g total) by mouth daily as needed for mild constipation. Goal of 2-3 soft bowel movements daily. 946 mL 3   loratadine  (CLARITIN ) 10 MG tablet Take 10 mg by mouth daily.     MOUNJARO 15 MG/0.5ML Pen SMARTSIG:1 pre-filled pen syringe SUB-Q Once a Week     nadolol  (CORGARD ) 20 MG tablet Take 1 tablet (20 mg total) by mouth daily. 30 tablet 3   ondansetron  (ZOFRAN -ODT) 4 MG disintegrating tablet Take 1 tablet (4 mg total) by mouth every 8 (eight) hours as needed for nausea or vomiting. 4mg  ODT q4 hours prn nausea/vomit 30 tablet 1   pantoprazole  (PROTONIX ) 40 MG tablet Take 1 tablet (40 mg total) by mouth  2 (two) times daily before a meal. 180 tablet 3   spironolactone  (ALDACTONE ) 100 MG tablet TAKE 1 TABLET BY MOUTH 2 TIMES PER DAY *NEW PRESCRIPTION REQUEST* 180 tablet 1   TYLENOL  500 MG tablet Take 1 tablet (500 mg total) by mouth every 6 (six) hours as needed for mild pain (pain score 1-3) or headache. 30 tablet 0   XIFAXAN  550 MG TABS tablet TAKE 1 TABLET BY MOUTH TWICE DAILY *NEW PRESCRIPTION REQUEST* 180 tablet 3   zinc  gluconate 50 MG tablet Take 50 mg by mouth daily.     docusate sodium  (COLACE) 100 MG capsule Take 1 capsule (100 mg total) by mouth every 12 (twelve) hours. (Patient not taking: Reported on 05/28/2024) 20 capsule 0   mupirocin  cream (BACTROBAN ) 2 % Apply 1 Application topically 2 (two) times daily. (Patient not taking: Reported on 05/28/2024) 15 g 0   No current facility-administered medications for this visit.    Allergies   Allergies as of 05/28/2024 - Review Complete 05/28/2024  Allergen Reaction Noted   Hydromorphone  Nausea And Vomiting and Other (See Comments) 04/26/2023   Vicodin [hydrocodone -acetaminophen ] Nausea And Vomiting  and Other (See Comments) 03/05/2012   Codeine Nausea And Vomiting 11/03/2009   Hydrocodone  Nausea And Vomiting and Other (See Comments) 04/26/2023   Onion Other (See Comments) 04/26/2023   Tape Swelling and Other (See Comments) 02/21/2015   Latex Rash 12/06/2016     Past Medical History   Past Medical History:  Diagnosis Date   Anxiety    Cirrhosis (HCC)    Depression    GERD (gastroesophageal reflux disease)    with grade d esophagitis in March 2023.   Hypercholesteremia    Hypertension    Trimalleolar fracture of ankle, closed, right, sequela    Type 2 diabetes mellitus (HCC)     Past Surgical History   Past Surgical History:  Procedure Laterality Date   ABDOMINAL HYSTERECTOMY     BIOPSY  09/21/2021   Procedure: BIOPSY;  Surgeon: Eartha Angelia Sieving, MD;  [facility];  Service: Gastroenterology;;   BIOPSY  06/26/2023   Procedure: BIOPSY;  Surgeon: Cindie Carlin POUR, DO;  [facility]: [facility];  Service: Endoscopy;;   CARDIAC CATHETERIZATION N/A 09/10/2015   Procedure: Left Heart Cath and Coronary Angiography;  Surgeon: Candyce GORMAN Reek, MD;  Location: [facility] INVASIVE CV LAB;  Service: Cardiovascular;  Laterality: N/A;   CHOLECYSTECTOMY     COLONOSCOPY WITH PROPOFOL  N/A 09/23/2022   Procedure: COLONOSCOPY WITH PROPOFOL ;  Surgeon: Cindie Carlin POUR, DO;  [facility];  Service: Endoscopy;  Laterality: N/A;  1:15 pn, asa 3   ESOPHAGEAL BRUSHING  09/21/2021   Procedure: ESOPHAGEAL BRUSHING;  Surgeon: Eartha Angelia, Sieving, MD;  [facility]: [facility];  Service: Gastroenterology;;   ESOPHAGOGASTRODUODENOSCOPY (EGD) WITH PROPOFOL  N/A 09/21/2021   Surgeon: Eartha Angelia Sieving, MD;  grade D esophagitis with scant bleeding, KOH negative, could not rule out small varices, but no medium to large varices, mild portal hypertensive gastropathy.  Pathology showed mild foveolar hyperplasia, negative for H. pylori.  Recommended repeat EGD in 3 months.    ESOPHAGOGASTRODUODENOSCOPY (EGD) WITH PROPOFOL  N/A 09/23/2022   Surgeon: Cindie Carlin POUR, DO; grade 1 esophageal varices, portal hypertensive gastropathy.  Recommended considering nonselective beta-blocker for primary prophylaxis.   ESOPHAGOGASTRODUODENOSCOPY (EGD) WITH PROPOFOL  N/A 06/26/2023   Procedure: ESOPHAGOGASTRODUODENOSCOPY (EGD) WITH PROPOFOL ;  Surgeon: Cindie Carlin POUR, DO;  [facility];  Service: Endoscopy;  Laterality: N/A;  245PM, ASA 3  OPEN REDUCTION INTERNAL FIXATION (ORIF) TIBIA/FIBULA FRACTURE Right 08/04/2016   Procedure: OPEN REDUCTION INTERNAL FIXATION (ORIF) RIGHT TRIMALLEOLAR FRACTURE;  Surgeon: Norleen Armor, MD;  Location: [facility];  Service: Orthopedics;  Laterality: Right;   POLYPECTOMY  09/23/2022   Procedure: POLYPECTOMY INTESTINAL;  Surgeon: Cindie Carlin POUR, DO;  [facility];  Service: Endoscopy;;   WRIST SURGERY Left    ganglion cyst    Past Family History   Family History  Problem Relation Age of Onset   Hypertension Mother    Cirrhosis Mother        NASH   Coronary artery disease Father    Heart disease Father    Barrett's esophagus Father    Coronary artery disease Paternal Uncle    Coronary artery disease Cousin    Colon cancer Neg Hx     Past Social History   Social History   Socioeconomic History   Marital status: Legally Separated    Spouse name: Not on file   Number of children: 2   Years of education: Not on file   Highest education level: Not on file  Occupational History   Occupation: property Geologist, Engineering: [employer]  Tobacco Use   Smoking status: Former    Current packs/day: 0.00    Average packs/day: 0.3 packs/day for 10.0 years (2.5 ttl pk-yrs)    Types: Cigarettes    Start date: 05/19/1981    Quit date: 05/20/1991    Years since quitting: 33.0   Smokeless tobacco: Never   Tobacco comments:    Used to smoke a pack in 1-2 weeks/ 1992  Vaping Use   Vaping status:  Never Used  Substance and Sexual Activity   Alcohol use: Not Currently   Drug use: No   Sexual activity: Yes    Birth control/protection: Surgical  Other Topics Concern   Not on file  Social History Narrative   Not on file   Social Drivers of Health   Financial Resource Strain: Not on file  Food Insecurity: No Food Insecurity (04/26/2023)   Hunger Vital Sign    Worried About Running Out of Food in the Last Year: Never true    Ran Out of Food in the Last Year: Never true  Transportation Needs: No Transportation Needs (04/26/2023)   PRAPARE - Administrator, Civil Service (Medical): No    Lack of Transportation (Non-Medical): No  Physical Activity: Not on file  Stress: Not on file  Social Connections: Not on file  Intimate Partner Violence: Not At Risk (04/26/2023)   Humiliation, Afraid, Rape, and Kick questionnaire    Fear of Current or Ex-Partner: No    Emotionally Abused: No    Physically Abused: No    Sexually Abused: No    Review of Systems   General: Negative for anorexia, weight loss, fever, chills, fatigue, +weakness.see hpi ENT: Negative for hoarseness, difficulty swallowing , nasal congestion. CV: Negative for chest pain, angina, palpitations, dyspnea on exertion, peripheral edema.  Respiratory: Negative for dyspnea at rest, dyspnea on exertion, cough, sputum, wheezing.  GI: See history of present illness. GU:  Negative for dysuria, hematuria, urinary incontinence, urinary frequency, nocturnal urination.  Endo: Negative for unusual weight change.     Physical Exam   BP 103/68   Pulse 68   Temp 98.9 F (37.2 C) (Temporal)   Ht 5' 4 (1.626 m)   Wt 261 lb 6.4 oz (118.6 kg)   SpO2 97%  BMI 44.87 kg/m    General: Well-nourished, well-developed in no acute distress.  Eyes: No icterus. Mouth: Oropharyngeal mucosa moist and pink   Lungs: Clear to auscultation bilaterally.  Heart: Regular rate and rhythm, no murmurs rubs or gallops.  Abdomen:  Bowel sounds are normal, nontender, nondistended, no hepatosplenomegaly or masses,  no abdominal bruits or hernia , no rebound or guarding.  Rectal: not performed Extremities: No lower extremity edema. No clubbing or deformities. Neuro: Alert and oriented x 4   Skin: Warm and dry, no jaundice.   Psych: Alert and cooperative, normal mood and affect.  Labs   See above  Imaging Studies   US  ASCITES (ABDOMEN LIMITED) Result Date: 05/17/2024 CLINICAL DATA:  Patient history of NASH cirrhosis, recurrent ascites. Request for possible diagnostic and therapeutic paracentesis EXAM: LIMITED ABDOMEN ULTRASOUND FOR ASCITES TECHNIQUE: Limited ultrasound survey for ascites was performed in all four abdominal quadrants. COMPARISON:  US  paracentesis 05/08/2024, US  ascites 03/27/2024 FINDINGS: Limited abdominal ultrasound shows scant ascites in the right lower and left lower quadrants which is not amenable to paracentesis. IMPRESSION: Scant ascites, insufficient for paracentesis. No procedure performed. Electronically Signed   By: Ester Sides M.D.   On: 05/17/2024 14:38   US  Paracentesis Result Date: 05/08/2024 INDICATION: 58 year old female with history of cirrhosis, recurrent ascites. Request for diagnostic and therapeutic paracentesis. EXAM: ULTRASOUND GUIDED DIAGNOSTIC AND THERAPEUTIC PARACENTESIS MEDICATIONS: 10 mL 1% lidocaine . COMPLICATIONS: None immediate. PROCEDURE: Informed written consent was obtained from the patient after a discussion of the risks, benefits and alternatives to treatment. A timeout was performed prior to the initiation of the procedure. Initial ultrasound scanning demonstrates a large amount of ascites within the left lateral abdominal quadrant. The left lateral abdomen was prepped and draped in the usual sterile fashion. 1% lidocaine  was used for local anesthesia. Following this, a 19 gauge, 15-cm, Yueh catheter was introduced. An ultrasound image was saved for documentation purposes.  The paracentesis was performed. The catheter was removed and a dressing was applied. The patient tolerated the procedure well without immediate post procedural complication. Patient received post-procedure intravenous albumin ; see nursing notes for details. FINDINGS: A total of approximately 3.4 liters of yellow fluid was removed. Samples were sent to the laboratory as requested by the clinical team. IMPRESSION: Successful ultrasound-guided paracentesis yielding 3.4 liters of peritoneal fluid. Performed by: Kacie Matthews PA-C Electronically Signed   By: Ester Sides M.D.   On: 05/08/2024 15:30    Assessment/Plan:    Assessment & Plan NASH cirrhosis with grade 1 esophageal varices, portal hypertension, thrombocytopenia, ascites, mild HE first noted 07/2023: -MELD 3.0 of 19 in 03/2024 -established with [facility] liver transplant, follow up appt in 07/2024.  -goal of BMI <40 to proceed with liver transplant listing    Esophageal varices  Grade 1 esophageal varices noted on EGD 06/26/2023. Previously with hypotension with higher noses of nadolol . Currently on 20mg  daily. [facility] liver transplant requesting EGD with banding if needed.  -EGD with possible banding.  I have discussed the risks, alternatives, benefits with regards to but not limited to the risk of reaction to medication, bleeding, infection, perforation and the patient is agreeable to proceed. Written consent to be obtained.  Ascites   Ascites managed with diuretics. Recent fluid accumulation without significant pain or fever. Suspected contaminated specimen, clinically did not present like SBP and no fluid available at time of follow up tap to reevaluate. - Continue furosemide  40 mg twice daily. - Continue spironolactone  100 mg twice daily. -  2 gram sodium restricted diet     Hepatic Encephalopathy New onset beginning of January 2025. Resolved with lactulose , but reports she can't take this regularly as she has terrible diarrhea. Using prn at  this point.  -continue Xifaxan  550mg  BID.     Thrombocytopenia Following with hematology.    GERD Well controlled on Protonix  BID.     Nutrition:  High-protein diet from a primarily plant-based diet. Avoid red meat.  No raw or undercooked meat, seafood, or shellfish. Low-fat/cholesterol/carbohydrate diet. Limit sodium to no more than 2000 mg/day including everything that you eat and drink. Recommend at least 30 minutes of aerobic and resistance exercise 3 days/week.    Sonny RAMAN. Ezzard, MHS, PA-C [facility]  Addendum: appears she is back on trial of nadolol  20mg  bid per [facility] liver notes.

## 2024-05-28 NOTE — Patient Instructions (Signed)
 Upper endoscopy to be scheduled at the request of your [facility] liver transplant provider.   I will look through your notes from [facility] to see if we can sort out the nadolol  issue.   Right now you are up to date on your labs. Continue to follow closely with [facility] for your cirrhosis.

## 2024-05-28 NOTE — H&P (View-Only) (Signed)
 GI Office Note    Referring Provider: Shona Norleen PEDLAR, MD Primary Care Physician:  Shona Norleen PEDLAR, MD  Primary Gastroenterologist: Carlin POUR. Cindie, DO   Chief Complaint   Chief Complaint  Patient presents with   Follow-up    Pt says she needs to have EGD    History of Present Illness   Candice Stanley is a 58 y.o. female presenting today for follow up. Last seen 12/2023. History of type 2 diabetes, HTN, HLD, hypothyroidism, GERD with esophagitis, constipation, hemorrhoids, NASH cirrhosis with thrombocytopenia, esophageal varices, ascites, HE, following with [facility] for transplant evaluation, presenting today for follow-up stating [facility] wants her to have an EGD.   She was last seen in our office in 12/2023.    Discussed the use of AI scribe software for clinical note transcription with the patient, who gave verbal consent to proceed.  History of Present Illness Candice Stanley is a 58 year old female with liver disease who presents for an upper endoscopy.  Patient was last seen by our practice in June.  She has a history of ascites/peripheral edema taking Lasix  40 mg twice daily and spironolactone  100 mg twice daily, no significant anasarca at time of last office visit.  She had paracentesis in April and May of this year yielding 2 L and 1.1 L respectively.  [facility] recommended against TIPS.  Previously did not tolerate lactulose  and has been on Xifaxan .  History of grade 1 esophageal varices in December 2024, nadolol  previously stopped due to hypotension but was resumed in March.  Started hepatitis B vaccination with [facility].  Back in May her MELD 3.0 was 19.    Patient states that she has been asked to lose 50 pounds prior to consideration of listing for liver transplant.   At her last visit at the end of September, she was told there was a possibility of being on the transplant list by the end of the year. She has lost 48 pounds in an effort to qualify for the transplant, though she struggles with  fluid retention affecting her weight. She faces dietary challenges, including difficulty consuming enough protein and managing fluid intake. She uses furosemide  40 mg twice daily and spironolactone  100 mg twice daily to manage fluid retention.  Her last paracentesis was November 29 at which time 3.4 L was removed.  Her cell count with differential showed total nucleated cells 168 with 13% neutrophils, Gram stain negative.  The aerobic bottle grew gram-positive cocci consistent with Staphylococcus epidermidis.  We suspected contaminated specimen.  Patient clinically had no signs of SBP.  We sent her for repeat tap but there was not adequate fluid to obtain a diagnostic tap.    This past week she did not feel as well as usual, experiencing fatigue and abdominal distension.  She mentioned that she missed all of her medications for several days, simply forgetting to refill her pill dispenser.  She is feeling better now that she has resumed her medications.   She is not experiencing constipation or bowel movement issues, typically having two to three bowel movements daily.  She has not used lactulose  recently.  She takes Zofran  twice daily. No blood in stool or melena.  She has a history of low blood pressure, leading to discontinuing her nadolol  previously.  She apparently resumed this several months back but according to her last liver transplant note nadolol  was stopped.  Patient inquired about getting a refill and she was started on a  lower dose   Her family history includes cirrhosis, as both her mother and uncle passed away from the condition. She is currently dealing with the recent unexpected death of her father-in-law, adding stress to her situation.  She has been trying to get back into church and has been more active, attending her granddaughter's sports events and walking more.   Wt Readings from Last 3 Encounters:  05/28/24 261 lb 6.4 oz (118.6 kg)  03/28/24 255 lb 8.2 oz (115.9 kg)   01/03/24 242 lb 9.6 oz (110 kg)     Prior Data     Results    From April 08, 2024: INR 1.49, sodium 140, potassium 4.4, creatinine 0.86, BUN 9, alb 3, Tbili 5, AST 47, ALT 23, AP 147, WBC 4.8, Hgb 13.3, MCV 104.7, Plt 54, alb 3  MELD 3.0 of 19  MRI 04/2024: IMPRESSION: 1. Cirrhotic liver configuration with sequela of portal venous hypertension including splenomegaly, mild ascites and multiple venous collaterals. No suspicious liver lesion seen. 2. There is a 1.7 x 2.4 cm left adrenal adenoma. 3. Multiple other nonacute observations, as described above.    EGD 06/26/23:  - Grade I esophageal varices.  - Portal hypertensive gastropathy.  - Gastritis. Biopsied.  - Normal duodenal bulb, first portion of the duodenum and second portion of the duodenum. - Pathology: Fundic type gastric mucosa showing minimal chronic gastritis with reactive epithelial changes negative for H pylori.    Last colonoscopy 09/23/2022: Nonbleeding internal hemorrhoids, diverticulosis in the sigmoid colon, two 4-5 mm polyps in the sigmoid colon resected and retrieved, stool in the entire examined colon that this was able to be cleared with fair visualization. Pathology with tubular adenomas. Recommended 5-year surveillance.    Medications   Current Outpatient Medications  Medication Sig Dispense Refill   ACCU-CHEK GUIDE TEST test strip SMARTSIG:1 Strip(s) 1-2 Times Daily     Accu-Chek Softclix Lancets lancets SMARTSIG:1 Lancet(s) Topical 1-2 Times Daily     ALPRAZolam  (XANAX ) 0.5 MG tablet Take 0.5 mg by mouth 2 (two) times daily as needed.     amLODipine  (NORVASC ) 2.5 MG tablet Take 2.5 mg by mouth daily.     Ascorbic Acid  100 MG CHEW Take 1 daily Take 1 daily     calcium  carbonate (TUMS - DOSED IN MG ELEMENTAL CALCIUM ) 500 MG chewable tablet Chew 2 tablets by mouth daily as needed for indigestion or heartburn.     Cholecalciferol (VITAMIN D3) 50 MCG (2000 UT) TABS Take 2,000 Units by mouth  daily.     FLUoxetine  (PROZAC ) 40 MG capsule Take 40 mg by mouth daily.     furosemide  (LASIX ) 40 MG tablet TAKE 1 TABLET BY MOUTH TWICE DAILY *NEW PRESCRIPTION REQUEST* 180 tablet 1   lactulose  (CHRONULAC ) 10 GM/15ML solution Take 30 mLs (20 g total) by mouth daily as needed for mild constipation. Goal of 2-3 soft bowel movements daily. 946 mL 3   loratadine  (CLARITIN ) 10 MG tablet Take 10 mg by mouth daily.     MOUNJARO 15 MG/0.5ML Pen SMARTSIG:1 pre-filled pen syringe SUB-Q Once a Week     nadolol  (CORGARD ) 20 MG tablet Take 1 tablet (20 mg total) by mouth daily. 30 tablet 3   ondansetron  (ZOFRAN -ODT) 4 MG disintegrating tablet Take 1 tablet (4 mg total) by mouth every 8 (eight) hours as needed for nausea or vomiting. 4mg  ODT q4 hours prn nausea/vomit 30 tablet 1   pantoprazole  (PROTONIX ) 40 MG tablet Take 1 tablet (40 mg total) by mouth  2 (two) times daily before a meal. 180 tablet 3   spironolactone  (ALDACTONE ) 100 MG tablet TAKE 1 TABLET BY MOUTH 2 TIMES PER DAY *NEW PRESCRIPTION REQUEST* 180 tablet 1   TYLENOL  500 MG tablet Take 1 tablet (500 mg total) by mouth every 6 (six) hours as needed for mild pain (pain score 1-3) or headache. 30 tablet 0   XIFAXAN  550 MG TABS tablet TAKE 1 TABLET BY MOUTH TWICE DAILY *NEW PRESCRIPTION REQUEST* 180 tablet 3   zinc  gluconate 50 MG tablet Take 50 mg by mouth daily.     docusate sodium  (COLACE) 100 MG capsule Take 1 capsule (100 mg total) by mouth every 12 (twelve) hours. (Patient not taking: Reported on 05/28/2024) 20 capsule 0   mupirocin  cream (BACTROBAN ) 2 % Apply 1 Application topically 2 (two) times daily. (Patient not taking: Reported on 05/28/2024) 15 g 0   No current facility-administered medications for this visit.    Allergies   Allergies as of 05/28/2024 - Review Complete 05/28/2024  Allergen Reaction Noted   Hydromorphone  Nausea And Vomiting and Other (See Comments) 04/26/2023   Vicodin [hydrocodone -acetaminophen ] Nausea And Vomiting  and Other (See Comments) 03/05/2012   Codeine Nausea And Vomiting 11/03/2009   Hydrocodone  Nausea And Vomiting and Other (See Comments) 04/26/2023   Onion Other (See Comments) 04/26/2023   Tape Swelling and Other (See Comments) 02/21/2015   Latex Rash 12/06/2016     Past Medical History   Past Medical History:  Diagnosis Date   Anxiety    Cirrhosis (HCC)    Depression    GERD (gastroesophageal reflux disease)    with grade d esophagitis in March 2023.   Hypercholesteremia    Hypertension    Trimalleolar fracture of ankle, closed, right, sequela    Type 2 diabetes mellitus (HCC)     Past Surgical History   Past Surgical History:  Procedure Laterality Date   ABDOMINAL HYSTERECTOMY     BIOPSY  09/21/2021   Procedure: BIOPSY;  Surgeon: Eartha Angelia Sieving, MD;  [facility];  Service: Gastroenterology;;   BIOPSY  06/26/2023   Procedure: BIOPSY;  Surgeon: Cindie Carlin POUR, DO;  [facility];  Service: Endoscopy;;   CARDIAC CATHETERIZATION N/A 09/10/2015   Procedure: Left Heart Cath and Coronary Angiography;  Surgeon: Candyce GORMAN Reek, MD;  Location: [facility] IN[facility]ASIVE CV LAB;  Service: Cardiovascular;  Laterality: N/A;   CHOLECYSTECTOMY     COLONOSCOPY WITH PROPOFOL  N/A 09/23/2022   Procedure: COLONOSCOPY WITH PROPOFOL ;  Surgeon: Cindie Carlin POUR, DO;  [facility];  Service: Endoscopy;  Laterality: N/A;  1:15 pn, asa 3   ESOPHAGEAL BRUSHING  09/21/2021   Procedure: ESOPHAGEAL BRUSHING;  Surgeon: Eartha Angelia, Sieving, MD;  [facility];  Service: Gastroenterology;;   ESOPHAGOGASTRODUODENOSCOPY (EGD) WITH PROPOFOL  N/A 09/21/2021   Surgeon: Eartha Angelia Sieving, MD;  grade D esophagitis with scant bleeding, KOH negative, could not rule out small varices, but no medium to large varices, mild portal hypertensive gastropathy.  Pathology showed mild foveolar hyperplasia, negative for H. pylori.  Recommended repeat EGD in 3 months.    ESOPHAGOGASTRODUODENOSCOPY (EGD) WITH PROPOFOL  N/A 09/23/2022   Surgeon: Cindie Carlin POUR, DO; grade 1 esophageal varices, portal hypertensive gastropathy.  Recommended considering nonselective beta-blocker for primary prophylaxis.   ESOPHAGOGASTRODUODENOSCOPY (EGD) WITH PROPOFOL  N/A 06/26/2023   Procedure: ESOPHAGOGASTRODUODENOSCOPY (EGD) WITH PROPOFOL ;  Surgeon: Cindie Carlin POUR, DO;  [facility];  Service: Endoscopy;  Laterality: N/A;  245PM, ASA 3  OPEN REDUCTION INTERNAL FIXATION (ORIF) TIBIA/FIBULA FRACTURE Right 08/04/2016   Procedure: OPEN REDUCTION INTERNAL FIXATION (ORIF) RIGHT TRIMALLEOLAR FRACTURE;  Surgeon: Norleen Armor, MD;  Location: [facility];  Service: Orthopedics;  Laterality: Right;   POLYPECTOMY  09/23/2022   Procedure: POLYPECTOMY INTESTINAL;  Surgeon: Cindie Carlin POUR, DO;  [facility];  Service: Endoscopy;;   WRIST SURGERY Left    ganglion cyst    Past Family History   Family History  Problem Relation Age of Onset   Hypertension Mother    Cirrhosis Mother        NASH   Coronary artery disease Father    Heart disease Father    Barrett's esophagus Father    Coronary artery disease Paternal Uncle    Coronary artery disease Cousin    Colon cancer Neg Hx     Past Social History   Social History   Socioeconomic History   Marital status: Legally Separated    Spouse name: Not on file   Number of children: 2   Years of education: Not on file   Highest education level: Not on file  Occupational History   Occupation: property Geologist, Engineering: [employer]  Tobacco Use   Smoking status: Former    Current packs/day: 0.00    Average packs/day: 0.3 packs/day for 10.0 years (2.5 ttl pk-yrs)    Types: Cigarettes    Start date: 05/19/1981    Quit date: 05/20/1991    Years since quitting: 33.0   Smokeless tobacco: Never   Tobacco comments:    Used to smoke a pack in 1-2 weeks/ 1992  Vaping Use   Vaping status:  Never Used  Substance and Sexual Activity   Alcohol use: Not Currently   Drug use: No   Sexual activity: Yes    Birth control/protection: Surgical  Other Topics Concern   Not on file  Social History Narrative   Not on file   Social Drivers of Health   Financial Resource Strain: Not on file  Food Insecurity: No Food Insecurity (04/26/2023)   Hunger Vital Sign    Worried About Running Out of Food in the Last Year: Never true    Ran Out of Food in the Last Year: Never true  Transportation Needs: No Transportation Needs (04/26/2023)   PRAPARE - Administrator, Civil Service (Medical): No    Lack of Transportation (Non-Medical): No  Physical Activity: Not on file  Stress: Not on file  Social Connections: Not on file  Intimate Partner Violence: Not At Risk (04/26/2023)   Humiliation, Afraid, Rape, and Kick questionnaire    Fear of Current or Ex-Partner: No    Emotionally Abused: No    Physically Abused: No    Sexually Abused: No    Review of Systems   General: Negative for anorexia, weight loss, fever, chills, fatigue, +weakness.see hpi ENT: Negative for hoarseness, difficulty swallowing , nasal congestion. CV: Negative for chest pain, angina, palpitations, dyspnea on exertion, peripheral edema.  Respiratory: Negative for dyspnea at rest, dyspnea on exertion, cough, sputum, wheezing.  GI: See history of present illness. GU:  Negative for dysuria, hematuria, urinary incontinence, urinary frequency, nocturnal urination.  Endo: Negative for unusual weight change.     Physical Exam   BP 103/68   Pulse 68   Temp 98.9 F (37.2 C) (Temporal)   Ht 5' 4 (1.626 m)   Wt 261 lb 6.4 oz (118.6 kg)   SpO2 97%  BMI 44.87 kg/m    General: Well-nourished, well-developed in no acute distress.  Eyes: No icterus. Mouth: Oropharyngeal mucosa moist and pink   Lungs: Clear to auscultation bilaterally.  Heart: Regular rate and rhythm, no murmurs rubs or gallops.  Abdomen:  Bowel sounds are normal, nontender, nondistended, no hepatosplenomegaly or masses,  no abdominal bruits or hernia , no rebound or guarding.  Rectal: not performed Extremities: No lower extremity edema. No clubbing or deformities. Neuro: Alert and oriented x 4   Skin: Warm and dry, no jaundice.   Psych: Alert and cooperative, normal mood and affect.  Labs   See above  Imaging Studies   US  ASCITES (ABDOMEN LIMITED) Result Date: 05/17/2024 CLINICAL DATA:  Patient history of NASH cirrhosis, recurrent ascites. Request for possible diagnostic and therapeutic paracentesis EXAM: LIMITED ABDOMEN ULTRASOUND FOR ASCITES TECHNIQUE: Limited ultrasound survey for ascites was performed in all four abdominal quadrants. COMPARISON:  US  paracentesis 05/08/2024, US  ascites 03/27/2024 FINDINGS: Limited abdominal ultrasound shows scant ascites in the right lower and left lower quadrants which is not amenable to paracentesis. IMPRESSION: Scant ascites, insufficient for paracentesis. No procedure performed. Electronically Signed   By: Ester Sides M.D.   On: 05/17/2024 14:38   US  Paracentesis Result Date: 05/08/2024 INDICATION: 58 year old female with history of cirrhosis, recurrent ascites. Request for diagnostic and therapeutic paracentesis. EXAM: ULTRASOUND GUIDED DIAGNOSTIC AND THERAPEUTIC PARACENTESIS MEDICATIONS: 10 mL 1% lidocaine . COMPLICATIONS: None immediate. PROCEDURE: Informed written consent was obtained from the patient after a discussion of the risks, benefits and alternatives to treatment. A timeout was performed prior to the initiation of the procedure. Initial ultrasound scanning demonstrates a large amount of ascites within the left lateral abdominal quadrant. The left lateral abdomen was prepped and draped in the usual sterile fashion. 1% lidocaine  was used for local anesthesia. Following this, a 19 gauge, 15-cm, Yueh catheter was introduced. An ultrasound image was saved for documentation purposes.  The paracentesis was performed. The catheter was removed and a dressing was applied. The patient tolerated the procedure well without immediate post procedural complication. Patient received post-procedure intravenous albumin ; see nursing notes for details. FINDINGS: A total of approximately 3.4 liters of yellow fluid was removed. Samples were sent to the laboratory as requested by the clinical team. IMPRESSION: Successful ultrasound-guided paracentesis yielding 3.4 liters of peritoneal fluid. Performed by: Kacie Matthews PA-C Electronically Signed   By: Ester Sides M.D.   On: 05/08/2024 15:30    Assessment/Plan:    Assessment & Plan NASH cirrhosis with grade 1 esophageal varices, portal hypertension, thrombocytopenia, ascites, mild HE first noted 07/2023: -MELD 3.0 of 19 in 03/2024 -established with [facility] liver transplant, follow up appt in 07/2024.  -goal of BMI <40 to proceed with liver transplant listing    Esophageal varices  Grade 1 esophageal varices noted on EGD 06/26/2023. Previously with hypotension with higher noses of nadolol . Currently on 20mg  daily. [facility] liver transplant requesting EGD with banding if needed.  -EGD with possible banding.  I have discussed the risks, alternatives, benefits with regards to but not limited to the risk of reaction to medication, bleeding, infection, perforation and the patient is agreeable to proceed. Written consent to be obtained.  Ascites   Ascites managed with diuretics. Recent fluid accumulation without significant pain or fever. Suspected contaminated specimen, clinically did not present like SBP and no fluid available at time of follow up tap to reevaluate. - Continue furosemide  40 mg twice daily. - Continue spironolactone  100 mg twice daily. -  2 gram sodium restricted diet     Hepatic Encephalopathy New onset beginning of January 2025. Resolved with lactulose , but reports she can't take this regularly as she has terrible diarrhea. Using prn at  this point.  -continue Xifaxan  550mg  BID.     Thrombocytopenia Following with hematology.    GERD Well controlled on Protonix  BID.     Nutrition:  High-protein diet from a primarily plant-based diet. Avoid red meat.  No raw or undercooked meat, seafood, or shellfish. Low-fat/cholesterol/carbohydrate diet. Limit sodium to no more than 2000 mg/day including everything that you eat and drink. Recommend at least 30 minutes of aerobic and resistance exercise 3 days/week.    Sonny RAMAN. Ezzard, MHS, PA-C [facility]  Addendum: appears she is back on trial of nadolol  20mg  bid per [facility] liver notes.

## 2024-05-29 ENCOUNTER — Telehealth: Payer: Self-pay | Admitting: *Deleted

## 2024-05-29 NOTE — Telephone Encounter (Signed)
 [employer] PA: Thank you for your online prior authorization/notification submission.  The prior authorization/notification reference number is: [id number].

## 2024-05-30 NOTE — Telephone Encounter (Signed)
 [employer] PA:  Covered/Approved, Prior authorization number [id number], Start date 06/10/2024- End date 09/08/2024

## 2024-06-03 ENCOUNTER — Encounter: Payer: Self-pay | Admitting: *Deleted

## 2024-06-03 ENCOUNTER — Encounter (HOSPITAL_COMMUNITY)
Admission: RE | Admit: 2024-06-03 | Discharge: 2024-06-03 | Disposition: A | Discharge status: Home / Self Care | Source: Ambulatory Visit | Attending: Internal Medicine | Admitting: Internal Medicine

## 2024-06-03 ENCOUNTER — Other Ambulatory Visit: Payer: Self-pay

## 2024-06-03 ENCOUNTER — Encounter (HOSPITAL_COMMUNITY): Payer: Self-pay

## 2024-06-10 ENCOUNTER — Other Ambulatory Visit: Payer: Self-pay

## 2024-06-10 ENCOUNTER — Ambulatory Visit (HOSPITAL_COMMUNITY)
Admission: RE | Admit: 2024-06-10 | Discharge: 2024-06-10 | Disposition: A | Discharge status: Home / Self Care | Attending: Internal Medicine | Admitting: Internal Medicine

## 2024-06-10 ENCOUNTER — Encounter (HOSPITAL_COMMUNITY)
Admission: RE | Disposition: A | Discharge status: Home / Self Care | Payer: Self-pay | Source: Home / Self Care | Attending: Internal Medicine

## 2024-06-10 ENCOUNTER — Encounter (HOSPITAL_COMMUNITY): Payer: Self-pay | Admitting: Internal Medicine

## 2024-06-10 ENCOUNTER — Ambulatory Visit (HOSPITAL_COMMUNITY)

## 2024-06-10 DIAGNOSIS — Z87891 Personal history of nicotine dependence: Secondary | ICD-10-CM | POA: Diagnosis not present

## 2024-06-10 DIAGNOSIS — I10 Essential (primary) hypertension: Secondary | ICD-10-CM

## 2024-06-10 DIAGNOSIS — I8500 Esophageal varices without bleeding: Secondary | ICD-10-CM

## 2024-06-10 DIAGNOSIS — K3189 Other diseases of stomach and duodenum: Secondary | ICD-10-CM | POA: Diagnosis not present

## 2024-06-10 HISTORY — PX: ESOPHAGOGASTRODUODENOSCOPY: SHX5428

## 2024-06-10 LAB — GLUCOSE, CAPILLARY: Glucose-Capillary: 88 mg/dL (ref 70–99)

## 2024-06-10 SURGERY — EGD (ESOPHAGOGASTRODUODENOSCOPY)
Anesthesia: General

## 2024-06-10 MED ORDER — LIDOCAINE 2% (20 MG/ML) 5 ML SYRINGE
INTRAMUSCULAR | Status: DC | PRN
  Administered 2024-06-10: 80 mg via INTRAVENOUS
  Administered 2024-06-10: 50 mg via INTRAVENOUS

## 2024-06-10 MED ORDER — LACTATED RINGERS IV SOLN: INTRAVENOUS | Status: DC | PRN

## 2024-06-10 MED ORDER — PROPOFOL 10 MG/ML IV BOLUS
INTRAVENOUS | Status: DC | PRN
  Administered 2024-06-10 (×2): 25 mg via INTRAVENOUS
  Administered 2024-06-10: 150 mg via INTRAVENOUS

## 2024-06-10 NOTE — Transfer of Care (Addendum)
 Immediate Anesthesia Transfer of Care Note  Patient: Candice Stanley  Procedure(s) Performed: EGD (ESOPHAGOGASTRODUODENOSCOPY)  Patient Location: Short Stay  Anesthesia Type:General  Level of Consciousness: awake  Airway & Oxygen Therapy: Patient Spontanous Breathing  Post-op Assessment: Report given to RN  Post vital signs: Reviewed and stable  Last Vitals:  Vitals Value Taken Time  BP 106/50 06/10/24 13:51  Temp 36.9 C 06/10/24 13:51  Pulse 73 06/10/24 13:51  Resp 18 06/10/24 13:51  SpO2 100 % 06/10/24 13:51    Last Pain:  Vitals:   06/10/24 1351  TempSrc: Oral  PainSc: 0-No pain      Patients Stated Pain Goal: 3 (06/10/24 1351)  Complications: No notable events documented.

## 2024-06-10 NOTE — Anesthesia Preprocedure Evaluation (Addendum)
 Anesthesia Evaluation    Airway Mallampati: II  TM Distance: >3 FB Neck ROM: Full    Dental no notable dental hx.    Pulmonary pneumonia, former smoker   Pulmonary exam normal breath sounds clear to auscultation       Cardiovascular hypertension, Normal cardiovascular exam Rhythm:Regular Rate:Normal     Neuro/Psych  PSYCHIATRIC DISORDERS Anxiety Depression     Neuromuscular disease    GI/Hepatic ,GERD  ,,(+) Cirrhosis   Esophageal Varices and ascites    Decompensated cirrhosis On transplant list-[facility]   Endo/Other  diabetesHypothyroidism    Renal/GU      Musculoskeletal   Abdominal ascites  Peds  Hematology  (+) Blood dyscrasia, anemia   Anesthesia Other Findings   Reproductive/Obstetrics                              Anesthesia Physical Anesthesia Plan  ASA: 4  Anesthesia Plan: General   Post-op Pain Management:    Induction: Intravenous  PONV Risk Score and Plan:   Airway Management Planned: Nasal Cannula  Additional Equipment:   Intra-op Plan:   Post-operative Plan:   Informed Consent: I have reviewed the patients History and Physical, chart, labs and discussed the procedure including the risks, benefits and alternatives for the proposed anesthesia with the patient or authorized representative who has indicated his/her understanding and acceptance.     Dental advisory given  Plan Discussed with: CRNA  Anesthesia Plan Comments:          Anesthesia Quick Evaluation

## 2024-06-10 NOTE — Discharge Instructions (Addendum)
 EGD Discharge instructions Please read the instructions outlined below and refer to this sheet in the next few weeks. These discharge instructions provide you with general information on caring for yourself after you leave the hospital. Your doctor may also give you specific instructions. While your treatment has been planned according to the most current medical practices available, unavoidable complications occasionally occur. If you have any problems or questions after discharge, please call your doctor. ACTIVITY You may resume your regular activity but move at a slower pace for the next 24 hours.  Take frequent rest periods for the next 24 hours.  Walking will help expel (get rid of) the air and reduce the bloated feeling in your abdomen.  No driving for 24 hours (because of the anesthesia (medicine) used during the test).  You may shower.  Do not sign any important legal documents or operate any machinery for 24 hours (because of the anesthesia used during the test).  NUTRITION Drink plenty of fluids.  You may resume your normal diet.  Begin with a light meal and progress to your normal diet.  Avoid alcoholic beverages for 24 hours or as instructed by your caregiver.  MEDICATIONS You may resume your normal medications unless your caregiver tells you otherwise.  WHAT YOU CAN EXPECT TODAY You may experience abdominal discomfort such as a feeling of fullness or "gas" pains.  FOLLOW-UP Your doctor will discuss the results of your test with you.  SEEK IMMEDIATE MEDICAL ATTENTION IF ANY OF THE FOLLOWING OCCUR: Excessive nausea (feeling sick to your stomach) and/or vomiting.  Severe abdominal pain and distention (swelling).  Trouble swallowing.  Temperature over 101 F (37.8 C).  Rectal bleeding or vomiting of blood.   Your upper endoscopy revealed 3 columns of esophageal varices.  These were small, grade 1.  Continue on nadolol  for bleeding prophylaxis.  Also had evidence of portal  hypertensive gastropathy in the stomach, previously seen.  Small bowel was normal.  Continue current medications.  Continue follow-up with hepatology at [facility].  We will continue to be a local resource for you if needed.   I hope you have a great rest of your week!  Candice Stanley. Candice Stanley, D.O. Gastroenterology and Hepatology [facility]

## 2024-06-10 NOTE — Anesthesia Postprocedure Evaluation (Signed)
 Anesthesia Post Note  Patient: Candice Stanley  Procedure(s) Performed: EGD (ESOPHAGOGASTRODUODENOSCOPY)  Patient location during evaluation: PACU Anesthesia Type: General Level of consciousness: awake and alert Pain management: pain level controlled Vital Signs Assessment: post-procedure vital signs reviewed and stable Respiratory status: spontaneous breathing, nonlabored ventilation, respiratory function stable and patient connected to nasal cannula oxygen Cardiovascular status: blood pressure returned to baseline and stable Postop Assessment: no apparent nausea or vomiting Anesthetic complications: no   No notable events documented.   Last Vitals:  Vitals:   06/10/24 1207 06/10/24 1351  BP: (!) 114/49 (!) 106/50  Pulse:  73  Resp:  18  Temp:  36.9 C  SpO2:  100%    Last Pain:  Vitals:   06/10/24 1351  TempSrc: Oral  PainSc: 0-No pain                 Andrea Limes

## 2024-06-10 NOTE — Interval H&P Note (Signed)
 History and Physical Interval Note:  06/10/2024 12:45 PM  Candice Stanley  has presented today for surgery, with the diagnosis of cirrhosis,esophageal varices.  The various methods of treatment have been discussed with the patient and family. After consideration of risks, benefits and other options for treatment, the patient has consented to  Procedure(s) with comments: EGD (ESOPHAGOGASTRODUODENOSCOPY) (N/A) - 1:15 pm, asa 3 ESOPHAGOSCOPY, WITH ESOPHAGEAL VARICES BAND LIGATION (N/A) as a surgical intervention.  The patient's history has been reviewed, patient examined, no change in status, stable for surgery.  I have reviewed the patient's chart and labs.  Questions were answered to the patient's satisfaction.     Carlin MARLA Hasty

## 2024-06-10 NOTE — Op Note (Signed)
 [facility] Patient Name: Candice Stanley Procedure Date: 06/10/2024 1:21 PM MRN: [id number] Date of Birth: 1965-10-06 Attending MD: Carlin POUR. Cindie , [place], [id number] CSN: [id number] Age: 58 Admit Type: Outpatient Procedure:                Upper GI endoscopy Indications:              Esophageal varices, Follow-up of esophageal varices Providers:                Carlin POUR. Cindie, DO, Devere Lodge, Chad Wilson,                            Technician Referring MD:              Medicines:                See the Anesthesia note for documentation of the                            administered medications Complications:            No immediate complications. Estimated Blood Loss:     Estimated blood loss: none. Procedure:                Pre-Anesthesia Assessment:                           - The anesthesia plan was to use monitored                            anesthesia care (MAC).                           After obtaining informed consent, the endoscope was                            passed under direct vision. Throughout the                            procedure, the patient's blood pressure, pulse, and                            oxygen saturations were monitored continuously. The                            [id number] ([id number]) Upper was introduced through                            the mouth, and advanced to the second part of                            duodenum. The upper GI endoscopy was accomplished                            without difficulty. The patient tolerated the                            procedure well. Scope In:  1:39:16 PM Scope Out: 1:43:31 PM Total Procedure Duration: 0 hours 4 minutes 15 seconds  Findings:      Three columns of grade I, small (< 5 mm) varices with no bleeding and no       stigmata of recent bleeding were found in the lower third of the       esophagus. No red wale signs were present.      There is no endoscopic evidence of varices in the entire examined        stomach.      Moderate portal hypertensive gastropathy was found in the entire       examined stomach.      The duodenal bulb, first portion of the duodenum and second portion of       the duodenum were normal. Impression:               - Grade I and small (< 5 mm) esophageal varices                            with no bleeding and no stigmata of recent bleeding.                           - Portal hypertensive gastropathy.                           - Normal duodenal bulb, first portion of the                            duodenum and second portion of the duodenum.                           - No specimens collected. Moderate Sedation:      Per Anesthesia Care Recommendation:           - Patient has a contact number available for                            emergencies. The signs and symptoms of potential                            delayed complications were discussed with the                            patient. Return to normal activities tomorrow.                            Written discharge instructions were provided to the                            patient.                           - Continue present medications.                           - Continue on Nadalol (patient reports she is  tolerating this at present)                           - Continue to work on weight loss                           - Low sodium (less than 2000 mg daily) diet.                           - Continue to follow up with [facility] transplant                            hepatology Procedure Code(s):        --- Professional ---                           [phone], Esophagogastroduodenoscopy, flexible,                            transoral; diagnostic, including collection of                            specimen(s) by brushing or washing, when performed                            (separate procedure) Diagnosis Code(s):        --- Professional ---                           I85.00, Esophageal  varices without bleeding                           K76.6, Portal hypertension                           K31.89, Other diseases of stomach and duodenum CPT copyright 2022 American [employer]. All rights reserved. The codes documented in this report are preliminary and upon coder review may  be revised to meet current compliance requirements. Carlin POUR. Cindie, DO Carlin POUR. Cindie, DO 06/10/2024 1:48:14 PM This report has been signed electronically. Number of Addenda: 0

## 2024-06-12 ENCOUNTER — Encounter (HOSPITAL_COMMUNITY): Payer: Self-pay | Admitting: Internal Medicine

## 2024-06-17 ENCOUNTER — Other Ambulatory Visit: Payer: Self-pay | Admitting: *Deleted

## 2024-06-17 ENCOUNTER — Other Ambulatory Visit (HOSPITAL_COMMUNITY): Payer: Self-pay | Admitting: Gastroenterology

## 2024-06-17 ENCOUNTER — Telehealth: Payer: Self-pay

## 2024-06-17 DIAGNOSIS — R188 Other ascites: Secondary | ICD-10-CM

## 2024-06-17 NOTE — Telephone Encounter (Signed)
 Pt came by the office stating that the hospital told her to inform you that she is running out of standing orders for paracentesis and will be needing them updated.

## 2024-06-17 NOTE — Telephone Encounter (Signed)
 Orders reflected for below and new standing order faxed to central scheduling. Appt for 12/10 changed to leslie orders

## 2024-06-17 NOTE — Telephone Encounter (Signed)
 She is scheduled for US  para 12/10. Appears she has one more order available but it has Kristen's name on it. This should be changed to mine.   She has not required frequently paras but we can provide a standing order per protocol. US  para, therapeutic.  Albumin  per protocol.  Send fluid for cell count with diff each time. She should hold her spironolactone  and furosemide  the day of para

## 2024-06-19 ENCOUNTER — Ambulatory Visit: Payer: Self-pay | Admitting: Gastroenterology

## 2024-06-19 ENCOUNTER — Encounter (HOSPITAL_COMMUNITY): Payer: Self-pay

## 2024-06-19 ENCOUNTER — Ambulatory Visit (HOSPITAL_COMMUNITY): Admission: RE | Admit: 2024-06-19 | Discharge: 2024-06-19 | Attending: Gastroenterology | Admitting: Gastroenterology

## 2024-06-19 ENCOUNTER — Ambulatory Visit (HOSPITAL_COMMUNITY): Admission: RE | Admit: 2024-06-19 | Source: Ambulatory Visit

## 2024-06-19 DIAGNOSIS — K7460 Unspecified cirrhosis of liver: Secondary | ICD-10-CM | POA: Diagnosis not present

## 2024-06-19 DIAGNOSIS — R188 Other ascites: Secondary | ICD-10-CM | POA: Insufficient documentation

## 2024-06-19 LAB — BODY FLUID CELL COUNT WITH DIFFERENTIAL
Eos, Fluid: 0 %
Lymphs, Fluid: 25 %
Monocyte-Macrophage-Serous Fluid: 61 % (ref 50–90)
Neutrophil Count, Fluid: 14 % (ref 0–25)
Total Nucleated Cell Count, Fluid: 184 uL (ref 0–1000)

## 2024-06-19 LAB — GRAM STAIN

## 2024-06-19 MED ORDER — ALBUMIN HUMAN 25 % IV SOLN
INTRAVENOUS | Status: AC
  Filled 2024-06-19: qty 200

## 2024-06-19 MED ORDER — LIDOCAINE HCL (PF) 2 % IJ SOLN
10.0000 mL | Freq: Once | INTRAMUSCULAR | Status: AC
  Administered 2024-06-19: 10 mL

## 2024-06-19 MED ORDER — ALBUMIN HUMAN 25 % IV SOLN
0.0000 g | Freq: Once | INTRAVENOUS | Status: AC
  Administered 2024-06-19: 50 g via INTRAVENOUS
  Filled 2024-06-19: qty 400

## 2024-06-19 MED ORDER — LIDOCAINE HCL (PF) 2 % IJ SOLN
INTRAMUSCULAR | Status: AC
  Filled 2024-06-19: qty 10

## 2024-06-19 NOTE — Progress Notes (Signed)
 Patient tolerated right sided Paracentesis and 50G of IV albumin  well today and 5 Liters of yellow ascites removed with labs collected and sent for processing. Patient verbalized understanding of discharge instructions and ambulatory at departure with no acute distress noted.

## 2024-06-19 NOTE — Procedures (Signed)
 PROCEDURE SUMMARY:  Successful ultrasound guided paracentesis from the right lower quadrant.  Yielded 5.0 L of clear yellow fluid.  No immediate complications.  The patient tolerated the procedure well.   Specimen was sent for labs.  EBL < 5mL  If the patient eventually requires >/=2 paracenteses in a 30 day period, screening evaluation by the [facility] will be assessed. Last paracentesis 05/08/24 yielding 3.4 L,  Clotilda Hesselbach, PA-C

## 2024-06-20 LAB — PATHOLOGIST SMEAR REVIEW

## 2024-06-21 ENCOUNTER — Other Ambulatory Visit: Payer: Self-pay | Admitting: *Deleted

## 2024-06-21 ENCOUNTER — Ambulatory Visit: Payer: Self-pay | Admitting: Gastroenterology

## 2024-06-21 ENCOUNTER — Ambulatory Visit (HOSPITAL_COMMUNITY)
Admission: RE | Admit: 2024-06-21 | Discharge: 2024-06-21 | Disposition: A | Source: Ambulatory Visit | Attending: Gastroenterology | Admitting: Gastroenterology

## 2024-06-21 DIAGNOSIS — R188 Other ascites: Secondary | ICD-10-CM

## 2024-06-21 LAB — BODY FLUID CELL COUNT WITH DIFFERENTIAL
Eos, Fluid: 0 %
Lymphs, Fluid: 19 %
Monocyte-Macrophage-Serous Fluid: 35 % — ABNORMAL LOW (ref 50–90)
Neutrophil Count, Fluid: 46 % — ABNORMAL HIGH (ref 0–25)
Total Nucleated Cell Count, Fluid: 147 uL (ref 0–1000)

## 2024-06-21 MED ORDER — LIDOCAINE-EPINEPHRINE (PF) 2 %-1:200000 IJ SOLN
INTRAMUSCULAR | Status: AC
  Filled 2024-06-21: qty 20

## 2024-06-21 MED ORDER — CEFPODOXIME PROXETIL 200 MG PO TABS: 200.0000 mg | ORAL_TABLET | Freq: Two times a day (BID) | ORAL | 0 refills | Status: DC

## 2024-06-21 NOTE — Addendum Note (Signed)
 Addended by: GAYLENE MADELIN CROME on: 06/21/2024 08:08 AM   Modules accepted: Orders

## 2024-06-22 ENCOUNTER — Other Ambulatory Visit: Payer: Self-pay | Admitting: Gastroenterology

## 2024-06-22 LAB — CULTURE, BODY FLUID W GRAM STAIN -BOTTLE: Special Requests: ADEQUATE

## 2024-06-23 MED ORDER — CIPROFLOXACIN HCL 500 MG PO TABS: 500.0000 mg | ORAL_TABLET | Freq: Two times a day (BID) | ORAL | 0 refills | Status: AC

## 2024-06-24 ENCOUNTER — Other Ambulatory Visit: Payer: Self-pay | Admitting: *Deleted

## 2024-06-24 ENCOUNTER — Encounter (HOSPITAL_COMMUNITY): Payer: Self-pay

## 2024-06-24 ENCOUNTER — Other Ambulatory Visit (HOSPITAL_COMMUNITY)
Admission: RE | Admit: 2024-06-24 | Discharge: 2024-06-24 | Disposition: A | Source: Ambulatory Visit | Attending: Transplant Hepatology | Admitting: Transplant Hepatology

## 2024-06-24 ENCOUNTER — Telehealth: Payer: Self-pay

## 2024-06-24 ENCOUNTER — Ambulatory Visit (HOSPITAL_COMMUNITY)
Admission: RE | Admit: 2024-06-24 | Discharge: 2024-06-24 | Disposition: A | Source: Ambulatory Visit | Attending: Gastroenterology | Admitting: Gastroenterology

## 2024-06-24 ENCOUNTER — Ambulatory Visit: Payer: Self-pay | Admitting: Gastroenterology

## 2024-06-24 DIAGNOSIS — R7881 Bacteremia: Secondary | ICD-10-CM | POA: Diagnosis present

## 2024-06-24 DIAGNOSIS — K7460 Unspecified cirrhosis of liver: Secondary | ICD-10-CM | POA: Diagnosis not present

## 2024-06-24 DIAGNOSIS — R188 Other ascites: Secondary | ICD-10-CM

## 2024-06-24 LAB — GRAM STAIN

## 2024-06-24 LAB — COMPREHENSIVE METABOLIC PANEL WITH GFR
ALT: 19 U/L (ref 0–44)
AST: 46 U/L — ABNORMAL HIGH (ref 15–41)
Albumin: 3.4 g/dL — ABNORMAL LOW (ref 3.5–5.0)
Alkaline Phosphatase: 116 U/L (ref 38–126)
Anion gap: 10 (ref 5–15)
BUN: 12 mg/dL (ref 6–20)
CO2: 21 mmol/L — ABNORMAL LOW (ref 22–32)
Calcium: 9 mg/dL (ref 8.9–10.3)
Chloride: 107 mmol/L (ref 98–111)
Creatinine, Ser: 0.71 mg/dL (ref 0.44–1.00)
GFR, Estimated: >60 mL/min (ref >60)
Glucose, Bld: 132 mg/dL — ABNORMAL HIGH (ref 70–99)
Potassium: 4.2 mmol/L (ref 3.5–5.1)
Sodium: 138 mmol/L (ref 135–145)
Total Bilirubin: 6.3 mg/dL — ABNORMAL HIGH (ref 0.0–1.2)
Total Protein: 5.2 g/dL — ABNORMAL LOW (ref 6.5–8.1)

## 2024-06-24 LAB — BODY FLUID CELL COUNT WITH DIFFERENTIAL
Lymphs, Fluid: 17 %
Monocyte-Macrophage-Serous Fluid: 25 % — ABNORMAL LOW (ref 50–90)
Neutrophil Count, Fluid: 58 % — ABNORMAL HIGH (ref 0–25)
Total Nucleated Cell Count, Fluid: 305 uL (ref 0–1000)

## 2024-06-24 LAB — CBC
HCT: 32.8 % — ABNORMAL LOW (ref 36.0–46.0)
Hemoglobin: 11.1 g/dL — ABNORMAL LOW (ref 12.0–15.0)
MCH: 36.5 pg — ABNORMAL HIGH (ref 26.0–34.0)
MCHC: 33.8 g/dL (ref 30.0–36.0)
MCV: 107.9 fL — ABNORMAL HIGH (ref 80.0–100.0)
Platelets: 40 K/uL — ABNORMAL LOW (ref 150–400)
RBC: 3.04 MIL/uL — ABNORMAL LOW (ref 3.87–5.11)
RDW: 15.1 % (ref 11.5–15.5)
WBC: 3.4 K/uL — ABNORMAL LOW (ref 4.0–10.5)
nRBC: 0 % (ref 0.0–0.2)

## 2024-06-24 LAB — PROTIME-INR
INR: 1.6 — ABNORMAL HIGH (ref 0.8–1.2)
Prothrombin Time: 19.4 s — ABNORMAL HIGH (ref 11.4–15.2)

## 2024-06-24 MED ORDER — LIDOCAINE HCL (PF) 2 % IJ SOLN
10.0000 mL | Freq: Once | INTRAMUSCULAR | Status: AC
  Administered 2024-06-24: 13:00:00 10 mL

## 2024-06-24 MED ORDER — LIDOCAINE HCL (PF) 2 % IJ SOLN
INTRAMUSCULAR | Status: AC
  Filled 2024-06-24: qty 10

## 2024-06-24 NOTE — Telephone Encounter (Signed)
 The care coordinator Sueanne from [facility] is requesting a return call from you to [phone] regarding this pt's plan of care. She stated that it is very imperative that you return her call.

## 2024-06-24 NOTE — Telephone Encounter (Signed)
 Instead of doing just a 2 liter diagnostic tap as per last result note order please allow up to 6 liters, using albumin  per protocol if more than 4 liters draw off. Send fluid for cell count and cultures. Thanks. The sooner the better please.

## 2024-06-24 NOTE — Telephone Encounter (Signed)
 Late documentation. I tried to contact Trisha at 4:30pm today. I left message for return call (gave my personal cell number).

## 2024-06-24 NOTE — Addendum Note (Signed)
 Addended by: JEANELL GRAEME RAMAN on: 06/24/2024 08:25 AM   Modules accepted: Orders

## 2024-06-24 NOTE — Telephone Encounter (Signed)
 Call placed and spoke with radiology staff @ [name] - patient undergoing third LVP in , one week (12/10 for approx 5 L, 12/12 for approx 1.5 L and today 12/15 for approx 3L) for repeat cultures per staff. Unable to draw labs in department - orders sent to Lab for collection. Spoke with daughter and made her aware of need for labs today.   Call placed and VM left with staff @ [facility], [name], to discuss POC.    Call placed and spoke with Krystie - made her aware of call to local GI and the need to contact me prior to additional LVP's. Plan to call and connect with Stephane Lucille Lunger directly either later this afternoon or tomorrow

## 2024-06-24 NOTE — Progress Notes (Signed)
 Patient tolerated right sided Paracentesis procedure well today and 4.4 Liters of clear yellow ascites removed with labs collected and sent for processing. Patient verbalized understanding of discharge instructions and ambulatory at departure with no acute distress noted.

## 2024-06-24 NOTE — Telephone Encounter (Signed)
 This was already addressed. I sent in cipro .

## 2024-06-24 NOTE — Telephone Encounter (Signed)
 Sent message to patient

## 2024-06-24 NOTE — Procedures (Signed)
 PROCEDURE SUMMARY:  Successful ultrasound guided paracentesis from the right lower quadrant.  Yielded 4.4 L of clear yellow fluid.  No immediate complications.  The patient tolerated the procedure well.   Specimen sent for labs.  EBL < 2 mL  ** Patient currently undergoing transplant work up with [facility], AGACNP-BC 06/24/2024, 2:22 PM

## 2024-06-24 NOTE — Telephone Encounter (Signed)
 noted

## 2024-06-25 ENCOUNTER — Other Ambulatory Visit (HOSPITAL_COMMUNITY): Payer: Self-pay | Admitting: Transplant Hepatology

## 2024-06-25 DIAGNOSIS — K7210 Chronic hepatic failure without coma: Secondary | ICD-10-CM

## 2024-06-25 DIAGNOSIS — R188 Other ascites: Secondary | ICD-10-CM

## 2024-06-25 NOTE — Telephone Encounter (Signed)
 Sorry, it is [phone]

## 2024-06-25 NOTE — Progress Notes (Signed)
 Cmp

## 2024-06-25 NOTE — Telephone Encounter (Signed)
 Call placed and spoke with Candice Stanley - we discussed in great length recent increased need for LVP's. Patient readily admits to not taking prescribed diuretics as directed (200 A, 80 L), often time skipping days of doses. She is also eating a high Na diet including relative amount of fast food. Spent approx 20 min providing education re: Low Na diet and medication adherence. Patient to stop splitting diuretic dose and being taking it all in the am to help with daytime disturbances. Repeat labs next week to assess kidney function.  Requested she weigh herself daily and report gain of > 10 lbs in a week, > 5 lbs in a few days to coordinator.   Also requested she contact me prior to any LVP to assess need as she has been undergoing them for as little as 1.5 L. Appt's in place for Jan with Hep / SX, will add nutrition for additional support.   Return call from [name] (local GI PA). Patient being covered with Cipro  for E-coli in ascites fluid. Stated we are happy to take over management of LVP's / medication changes - she was in agreement,   LVP (monthly) order sent to [facility]

## 2024-06-25 NOTE — Telephone Encounter (Signed)
 Information has been faxed

## 2024-06-25 NOTE — Telephone Encounter (Signed)
 Spoke to Trisha Dasilva. She requests we continue to share any changes in patient's condition and share records for continuity of care.   She is also going to have patient go for labs at [facility] next week, to check kidney status after patient restarts her diuretics. Patient should be on lasix  80mg  in AM and spironolactone  200mg  in AM but has been somewhat noncompliant per Trisha. She also is not following her 2 g sodium diet.   I have updated Sueanne on recent paras and cultures. She requested records. She will have [facility] coordinate her abdominal taps so they can be aware of clinical status. She is sending order to central scheduling this afternoon. Patient is to contact [facility] prior to having paras done.   She will continue to follow with us  locally but [facility] will take on majority of her care. Patient is scheduled next month to follow up at [facility].     MANDY/TAMMY C Please send copy of the following via fax to Sueanne Naegeli [facility] Care Coordinator to [phone]  Last EGD report 06/10/2024 US  paracentesis imaging reports from 06/19/2024, 06/21/2024, 06/24/2024 All labs from 06/19/2024, 06/21/2024, and 06/24/2024.  Last OV note by me  THEY NEED TODAY PLEASE.

## 2024-06-26 LAB — AEROBIC/ANAEROBIC CULTURE W GRAM STAIN (SURGICAL/DEEP WOUND)
Culture: NO GROWTH
Gram Stain: NONE SEEN

## 2024-06-27 NOTE — Addendum Note (Signed)
 Addended by: JEANELL GRAEME RAMAN on: 06/27/2024 10:07 AM   Modules accepted: Orders

## 2024-06-29 LAB — CULTURE, BODY FLUID W GRAM STAIN -BOTTLE
Culture: NO GROWTH
Special Requests: ADEQUATE

## 2024-07-01 ENCOUNTER — Encounter: Payer: Self-pay | Admitting: Internal Medicine

## 2024-07-01 NOTE — Telephone Encounter (Signed)
 I called it and it is definitely a fax machine. The two numbers are not the same. The last two numbers are switched. Please use [phone]

## 2024-07-02 ENCOUNTER — Other Ambulatory Visit (HOSPITAL_COMMUNITY)
Admission: RE | Admit: 2024-07-02 | Discharge: 2024-07-02 | Disposition: A | Discharge status: Home / Self Care | Source: Ambulatory Visit | Attending: Transplant Hepatology | Admitting: Transplant Hepatology

## 2024-07-02 DIAGNOSIS — K7469 Other cirrhosis of liver: Secondary | ICD-10-CM | POA: Insufficient documentation

## 2024-07-02 LAB — COMPREHENSIVE METABOLIC PANEL WITH GFR
ALT: 21 U/L (ref 0–44)
AST: 49 U/L — ABNORMAL HIGH (ref 15–41)
Albumin: 3.2 g/dL — ABNORMAL LOW (ref 3.5–5.0)
Alkaline Phosphatase: 115 U/L (ref 38–126)
Anion gap: 12 (ref 5–15)
BUN: 16 mg/dL (ref 6–20)
CO2: 22 mmol/L (ref 22–32)
Calcium: 9.2 mg/dL (ref 8.9–10.3)
Chloride: 104 mmol/L (ref 98–111)
Creatinine, Ser: 1 mg/dL (ref 0.44–1.00)
GFR, Estimated: >60 mL/min
Glucose, Bld: 121 mg/dL — ABNORMAL HIGH (ref 70–99)
Potassium: 3.8 mmol/L (ref 3.5–5.1)
Sodium: 139 mmol/L (ref 135–145)
Total Bilirubin: 5.5 mg/dL — ABNORMAL HIGH (ref 0.0–1.2)
Total Protein: 5.2 g/dL — ABNORMAL LOW (ref 6.5–8.1)

## 2024-07-02 LAB — CBC
HCT: 33.2 % — ABNORMAL LOW (ref 36.0–46.0)
Hemoglobin: 11.2 g/dL — ABNORMAL LOW (ref 12.0–15.0)
MCH: 36 pg — ABNORMAL HIGH (ref 26.0–34.0)
MCHC: 33.7 g/dL (ref 30.0–36.0)
MCV: 106.8 fL — ABNORMAL HIGH (ref 80.0–100.0)
Platelets: 50 K/uL — ABNORMAL LOW (ref 150–400)
RBC: 3.11 MIL/uL — ABNORMAL LOW (ref 3.87–5.11)
RDW: 14.7 % (ref 11.5–15.5)
WBC: 3.4 K/uL — ABNORMAL LOW (ref 4.0–10.5)
nRBC: 0 % (ref 0.0–0.2)

## 2024-07-02 LAB — PROTIME-INR
INR: 1.5 — ABNORMAL HIGH (ref 0.8–1.2)
Prothrombin Time: 19.4 s — ABNORMAL HIGH (ref 11.4–15.2)

## 2024-07-03 ENCOUNTER — Ambulatory Visit (HOSPITAL_COMMUNITY)
Admission: RE | Admit: 2024-07-03 | Discharge: 2024-07-03 | Disposition: A | Discharge status: Home / Self Care | Source: Ambulatory Visit | Attending: Transplant Hepatology | Admitting: Transplant Hepatology

## 2024-07-03 ENCOUNTER — Ambulatory Visit (HOSPITAL_COMMUNITY)

## 2024-07-03 ENCOUNTER — Other Ambulatory Visit (HOSPITAL_COMMUNITY): Payer: Self-pay | Admitting: Transplant Hepatology

## 2024-07-03 ENCOUNTER — Encounter (HOSPITAL_COMMUNITY): Payer: Self-pay

## 2024-07-03 DIAGNOSIS — K7210 Chronic hepatic failure without coma: Secondary | ICD-10-CM | POA: Insufficient documentation

## 2024-07-03 DIAGNOSIS — K7460 Unspecified cirrhosis of liver: Secondary | ICD-10-CM | POA: Insufficient documentation

## 2024-07-03 DIAGNOSIS — R188 Other ascites: Secondary | ICD-10-CM | POA: Diagnosis present

## 2024-07-03 MED ORDER — ALBUMIN HUMAN 25 % IV SOLN
INTRAVENOUS | Status: AC
  Filled 2024-07-03: qty 100

## 2024-07-03 MED ORDER — ALBUMIN HUMAN 25 % IV SOLN: 25.0000 g | Freq: Once | INTRAVENOUS | Status: DC

## 2024-07-03 MED ORDER — LIDOCAINE HCL (PF) 2 % IJ SOLN
INTRAMUSCULAR | Status: AC
  Filled 2024-07-03: qty 10

## 2024-07-08 ENCOUNTER — Ambulatory Visit: Admitting: Gastroenterology

## 2024-07-21 NOTE — Telephone Encounter (Signed)
------------------------------------------------------------------------------- °  Summary: Pt paged - med error -------------------------------------------------------------------------------  Candice Stanley paged on call pager.   Candice Stanley reports that Dr. Loreli took her off her lasix  and put her on bumex this week. Her medications came in the mail lastnight or this morning and she took her pre packaged morning pills which still included the lasix . She had already taken her Bumex (just 1 pill forgetting that the prescription is actually for 2pills).  She called to make sure she was going to be ok.   She feels fine now. Been about 20 minutes since medications were taken. I asked her to take her BP in about an hour or so and if it is a good bit lower than usual to call me back. Her medications also included spirolactone. I made her aware that she is likely to urinate even more than usual today and may lose salt from her system. Told her that she may need a little more water to drink than usual and may need a little salt if she feels light headed at all. If any of those symptoms arise I have given her instruction to call. If anything new occurs I told her to give me a call or go to the ED if she feels unsafe. She is not alone. I asked that she report her  BP once she takes it.   Routing to primary dealer.

## 2024-07-22 NOTE — Telephone Encounter (Signed)
 Call placed and spoke with Stephane Lucille Lunger - discussed on call page over the weekend. Patient states she is all settled with medication changes and has no questions at this time.   States atarax  did not help with insomnia. Provided some tips for sleep including low dose melatonin with Mg split into two doses and adding low dose lactulose  to assist with HE (if contributing). Will also discuss with Dr. Maree

## 2024-07-26 ENCOUNTER — Other Ambulatory Visit (HOSPITAL_COMMUNITY): Payer: Self-pay | Admitting: Transplant Hepatology

## 2024-07-26 ENCOUNTER — Ambulatory Visit (HOSPITAL_COMMUNITY)
Admission: RE | Admit: 2024-07-26 | Discharge: 2024-07-26 | Disposition: A | Discharge status: Home / Self Care | Source: Ambulatory Visit | Attending: Gastroenterology | Admitting: Gastroenterology

## 2024-07-26 DIAGNOSIS — K7460 Unspecified cirrhosis of liver: Secondary | ICD-10-CM

## 2024-07-26 DIAGNOSIS — R188 Other ascites: Secondary | ICD-10-CM | POA: Diagnosis present

## 2024-07-26 DIAGNOSIS — K7210 Chronic hepatic failure without coma: Secondary | ICD-10-CM | POA: Diagnosis present

## 2024-07-26 MED ORDER — LIDOCAINE HCL (PF) 2 % IJ SOLN
INTRAMUSCULAR | Status: AC
  Filled 2024-07-26: qty 10

## 2024-07-26 NOTE — Progress Notes (Signed)
 Limited abdominal US  of all 4 quadrants shows scant ascites which is not amenable to safe percutaneous paracentesis.  No procedure performed. Imaging results discussed with patient.  Images are available for review under imaging section of Epic.  Please call with questions or concerns.  Candice Stanley

## 2024-07-26 NOTE — Progress Notes (Signed)
 Pt brought to US  1 in no obvious distress. Ultrasound exam reveals no appreciacable fluid accumulations. Paracentesis deferred to future date.

## 2024-09-18 ENCOUNTER — Inpatient Hospital Stay

## 2024-09-25 ENCOUNTER — Inpatient Hospital Stay: Admitting: Oncology
